# Patient Record
Sex: Female | Born: 1945
Health system: Southern US, Community
[De-identification: ages and names within clinical notes are randomized; demographics above are authoritative.]

## PROBLEM LIST (undated history)

## (undated) DIAGNOSIS — F32A Depression, unspecified: Secondary | ICD-10-CM

## (undated) DIAGNOSIS — M1990 Unspecified osteoarthritis, unspecified site: Secondary | ICD-10-CM

## (undated) DIAGNOSIS — E669 Obesity, unspecified: Secondary | ICD-10-CM

## (undated) DIAGNOSIS — F329 Major depressive disorder, single episode, unspecified: Secondary | ICD-10-CM

## (undated) DIAGNOSIS — F419 Anxiety disorder, unspecified: Secondary | ICD-10-CM

## (undated) DIAGNOSIS — K759 Inflammatory liver disease, unspecified: Secondary | ICD-10-CM

## (undated) HISTORY — PX: WISDOM TOOTH EXTRACTION: SHX21

## (undated) HISTORY — DX: Obesity, unspecified: E66.9

## (undated) HISTORY — DX: Unspecified osteoarthritis, unspecified site: M19.90

## (undated) HISTORY — DX: Major depressive disorder, single episode, unspecified: F32.9

## (undated) HISTORY — DX: Depression, unspecified: F32.A

---

## 1983-01-19 HISTORY — PX: LAPAROSCOPIC TUBAL LIGATION: SUR803

## 2000-11-23 ENCOUNTER — Other Ambulatory Visit: Admission: RE | Admit: 2000-11-23 | Discharge: 2000-11-23 | Payer: Self-pay | Admitting: Internal Medicine

## 2002-10-03 ENCOUNTER — Other Ambulatory Visit: Admission: RE | Admit: 2002-10-03 | Discharge: 2002-10-03 | Payer: Self-pay | Admitting: Family Medicine

## 2002-10-19 ENCOUNTER — Ambulatory Visit (HOSPITAL_COMMUNITY): Admission: RE | Admit: 2002-10-19 | Discharge: 2002-10-19 | Payer: Self-pay | Admitting: Gastroenterology

## 2005-04-30 ENCOUNTER — Other Ambulatory Visit: Admission: RE | Admit: 2005-04-30 | Discharge: 2005-04-30 | Payer: Self-pay | Admitting: Family Medicine

## 2005-10-29 ENCOUNTER — Ambulatory Visit: Payer: Self-pay | Admitting: Family Medicine

## 2006-02-05 ENCOUNTER — Emergency Department (HOSPITAL_COMMUNITY): Admission: EM | Admit: 2006-02-05 | Discharge: 2006-02-05 | Payer: Self-pay | Admitting: *Deleted

## 2006-12-30 ENCOUNTER — Ambulatory Visit: Payer: Self-pay | Admitting: Family Medicine

## 2007-01-19 LAB — HM COLONOSCOPY: HM Colonoscopy: NEGATIVE

## 2008-08-26 ENCOUNTER — Ambulatory Visit: Payer: Self-pay | Admitting: Family Medicine

## 2008-08-26 ENCOUNTER — Encounter: Payer: Self-pay | Admitting: Family Medicine

## 2008-08-26 ENCOUNTER — Other Ambulatory Visit: Admission: RE | Admit: 2008-08-26 | Discharge: 2008-08-26 | Payer: Self-pay | Admitting: Family Medicine

## 2008-08-26 LAB — HM PAP SMEAR: HM Pap smear: NEGATIVE

## 2009-10-17 LAB — HM MAMMOGRAPHY: HM Mammogram: NEGATIVE

## 2010-01-18 DIAGNOSIS — Z8489 Family history of other specified conditions: Secondary | ICD-10-CM

## 2010-01-18 HISTORY — DX: Family history of other specified conditions: Z84.89

## 2010-04-29 ENCOUNTER — Ambulatory Visit (INDEPENDENT_AMBULATORY_CARE_PROVIDER_SITE_OTHER): Payer: BLUE CROSS/BLUE SHIELD | Admitting: Family Medicine

## 2010-04-29 DIAGNOSIS — H01009 Unspecified blepharitis unspecified eye, unspecified eyelid: Secondary | ICD-10-CM

## 2010-06-05 NOTE — Op Note (Signed)
   NAME:  Megan Nunez, Megan Nunez                            ACCOUNT NO.:  [id number]   MEDICAL RECORD NO.:  [id number]                   PATIENT TYPE:  AMB   LOCATION:  ENDO                                 FACILITY:  [facility]   PHYSICIAN:  Danise Edge, M.D.                DATE OF BIRTH:  10-19-1945   DATE OF PROCEDURE:  10/19/2002  DATE OF DISCHARGE:                                 OPERATIVE REPORT   PROCEDURE:  Screening colonoscopy.   INDICATIONS FOR PROCEDURE:  Ms. Megan Nunez is a 65 year old female born  30-Aug-1945. Ms. Megan Nunez is scheduled to undergo her first screening  colonoscopy with polypectomy to prevent colon cancer.   ENDOSCOPIST:  Charolett Bumpers, M.D.   PREMEDICATION:  Versed 7.5 mg, Demerol 50 mg .   DESCRIPTION OF PROCEDURE:  After obtaining informed consent, Megan Nunez was  placed in the left lateral decubitus position. I administered intravenous  Demerol and intravenous Versed to achieve conscious sedation for the  procedure. The patient's blood pressure, oxygen saturation and cardiac  rhythm were monitored throughout the procedure and documented in the medical  record.   Anal inspection was normal. Digital rectal exam was normal. The Olympus  adjustable pediatric colonoscope was introduced into the rectum and advanced  to the cecum. Colonic preparation for the exam today was excellent.   RECTUM:  Normal.   SIGMOID COLON AND DESCENDING COLON:  Normal.   SPLENIC FLEXURE:  Normal.   TRANSVERSE COLON:  Normal.   HEPATIC FLEXURE:  Normal.   ASCENDING COLON:  Normal.   CECUM AND ILEOCECAL VALVE:  Normal.    ASSESSMENT:  Normal screening proctocolonoscopy to the cecum. No endoscopic  evidence for the presence of colorectal neoplasia.                                               Danise Edge, M.D.    MJ/MEDQ  D:  10/19/2002  T:  10/20/2002  Job:  [id number]   cc:   Talmadge Coventry, M.D.  [address], [address]  [place]  [place] [address]  Fax:  [phone]

## 2010-08-14 ENCOUNTER — Encounter: Payer: Self-pay | Admitting: Family Medicine

## 2011-03-24 ENCOUNTER — Encounter: Payer: Self-pay | Admitting: Internal Medicine

## 2011-07-16 ENCOUNTER — Ambulatory Visit (INDEPENDENT_AMBULATORY_CARE_PROVIDER_SITE_OTHER): Payer: BLUE CROSS/BLUE SHIELD | Admitting: Medical

## 2011-07-16 ENCOUNTER — Telehealth: Payer: Self-pay | Admitting: Family Medicine

## 2011-07-16 ENCOUNTER — Encounter: Payer: Self-pay | Admitting: Medical

## 2011-07-16 VITALS — BP 132/80 | HR 78 | Temp 97.5°F | Resp 16 | Wt 159.0 lb

## 2011-07-16 DIAGNOSIS — L988 Other specified disorders of the skin and subcutaneous tissue: Secondary | ICD-10-CM

## 2011-07-16 DIAGNOSIS — IMO0002 Reserved for concepts with insufficient information to code with codable children: Secondary | ICD-10-CM

## 2011-07-16 NOTE — Progress Notes (Signed)
Subjective:   HPI  Megan Nunez is a 66 y.o. female who presents for finger issue.  Has growth on left middle finger x 1 year causing grooving in her nail.  Has used nothing for the finger.  In the last few weeks the growth is more red and seems to be getting bigger.  No other aggravating or relieving factors.    No other c/o.  The following portions of the patient's history were reviewed and updated as appropriate: allergies, current medications, past family history, past medical history, past social history, past surgical history and problem list.  Past Medical History  Diagnosis Date  . Obesity     No Known Allergies   Review of Systems ROS reviewed and was negative other than noted in HPI or above.    Objective:   Physical Exam  General appearance: alert, no distress, WD/WN Skin: Left middle finger with raised 4mm papular lesion at base of nailbed, and this has caused a central groove in the nail.   Assessment and Plan :     Encounter Diagnosis  Name Primary?  . Cyst of finger Yes   Cyst vs pyogenic granuloma vs other.  Referral to dermatology for further management.

## 2011-07-16 NOTE — Telephone Encounter (Signed)
Patient was made aware of her appointment to see the dermatologists on 07/16/11 @ 305 pm. [facility] July 26,13 @ 1030 am With Eulah Pont   CLS

## 2012-06-27 ENCOUNTER — Ambulatory Visit (INDEPENDENT_AMBULATORY_CARE_PROVIDER_SITE_OTHER): Payer: BC Managed Care – PPO | Admitting: Family Medicine

## 2012-06-27 ENCOUNTER — Encounter: Payer: Self-pay | Admitting: Family Medicine

## 2012-06-27 VITALS — BP 140/80 | HR 72 | Wt 168.0 lb

## 2012-06-27 DIAGNOSIS — F329 Major depressive disorder, single episode, unspecified: Secondary | ICD-10-CM

## 2012-06-27 MED ORDER — BUPROPION HCL ER (XL) 300 MG PO TB24: 300.0000 mg | ORAL_TABLET | Freq: Every day | ORAL | Status: DC

## 2012-06-27 MED ORDER — FLUOXETINE HCL 40 MG PO CAPS: 40.0000 mg | ORAL_CAPSULE | Freq: Every day | ORAL | Status: DC

## 2012-06-27 NOTE — Progress Notes (Signed)
  Subjective:    Patient ID: Megan Nunez, female    DOB: 04/25/1945, 67 y.o.   MRN: [id number]  HPI She is here for consult concerning depression. She has a long history of difficulty with this. Presently she is stable on Prozac and Wellbutrin. She has been followed by Dr. Jules Schick. In the past she has tried twice to come off the medication and unfortunately lost jobs both times. She is comfortable staying on the medication. She was also given Vyvanse however has not been taking this regularly.   Review of Systems     Objective:   Physical Exam Alert and in no distress with appropriate affect       Assessment & Plan:  Depressive disorder, not elsewhere classified - Plan: buPROPion (WELLBUTRIN XL) 300 MG 24 hr tablet, FLUoxetine (PROZAC) 40 MG capsule I told her that I was comfortable continuing her psychotropic medications but was uncomfortable concerning her Vyvanse. She was comfortable with this.we also talked about the fact that she has on 2 occasions stop the medication and was unsuccessful. My plan is to continue her present medication regimen indefinitely.Did recommend she come back for complete examination.

## 2012-07-12 ENCOUNTER — Encounter: Payer: Self-pay | Admitting: Internal Medicine

## 2012-07-25 ENCOUNTER — Encounter: Payer: Self-pay | Admitting: Family Medicine

## 2012-07-25 ENCOUNTER — Ambulatory Visit (INDEPENDENT_AMBULATORY_CARE_PROVIDER_SITE_OTHER): Payer: BC Managed Care – PPO | Admitting: Family Medicine

## 2012-07-25 ENCOUNTER — Other Ambulatory Visit (HOSPITAL_COMMUNITY)
Admission: RE | Admit: 2012-07-25 | Discharge: 2012-07-25 | Disposition: A | Discharge status: Home / Self Care | Payer: BC Managed Care – PPO | Source: Ambulatory Visit | Attending: Family Medicine | Admitting: Family Medicine

## 2012-07-25 VITALS — BP 112/70 | HR 77 | Ht 61.0 in | Wt 166.0 lb

## 2012-07-25 DIAGNOSIS — Z01419 Encounter for gynecological examination (general) (routine) without abnormal findings: Secondary | ICD-10-CM | POA: Insufficient documentation

## 2012-07-25 DIAGNOSIS — H9319 Tinnitus, unspecified ear: Secondary | ICD-10-CM

## 2012-07-25 DIAGNOSIS — M1990 Unspecified osteoarthritis, unspecified site: Secondary | ICD-10-CM

## 2012-07-25 DIAGNOSIS — N3946 Mixed incontinence: Secondary | ICD-10-CM | POA: Insufficient documentation

## 2012-07-25 DIAGNOSIS — Z124 Encounter for screening for malignant neoplasm of cervix: Secondary | ICD-10-CM

## 2012-07-25 DIAGNOSIS — Z0000 Encounter for general adult medical examination without abnormal findings: Secondary | ICD-10-CM

## 2012-07-25 DIAGNOSIS — M129 Arthropathy, unspecified: Secondary | ICD-10-CM

## 2012-07-25 DIAGNOSIS — N393 Stress incontinence (female) (male): Secondary | ICD-10-CM

## 2012-07-25 DIAGNOSIS — F329 Major depressive disorder, single episode, unspecified: Secondary | ICD-10-CM

## 2012-07-25 DIAGNOSIS — H9313 Tinnitus, bilateral: Secondary | ICD-10-CM

## 2012-07-25 DIAGNOSIS — Z1239 Encounter for other screening for malignant neoplasm of breast: Secondary | ICD-10-CM

## 2012-07-25 LAB — CBC WITH DIFFERENTIAL/PLATELET
Basophils Relative: 1 % (ref 0–1)
Hemoglobin: 14.2 g/dL (ref 12.0–15.0)
Lymphocytes Relative: 31 % (ref 12–46)
MCHC: 34.7 g/dL (ref 30.0–36.0)
Monocytes Relative: 6 % (ref 3–12)
Neutro Abs: 4.7 10*3/uL (ref 1.7–7.7)
Neutrophils Relative %: 59 % (ref 43–77)
RBC: 4.79 MIL/uL (ref 3.87–5.11)
WBC: 8 10*3/uL (ref 4.0–10.5)

## 2012-07-25 LAB — COMPREHENSIVE METABOLIC PANEL
AST: 20 U/L (ref 0–37)
Albumin: 4.6 g/dL (ref 3.5–5.2)
Alkaline Phosphatase: 74 U/L (ref 39–117)
Chloride: 102 mEq/L (ref 96–112)
Glucose, Bld: 84 mg/dL (ref 70–99)
Potassium: 4.5 mEq/L (ref 3.5–5.3)
Sodium: 138 mEq/L (ref 135–145)
Total Protein: 7.1 g/dL (ref 6.0–8.3)

## 2012-07-25 LAB — POCT URINALYSIS DIPSTICK
Bilirubin, UA: NEGATIVE
Glucose, UA: NEGATIVE
Ketones, UA: NEGATIVE
Leukocytes, UA: NEGATIVE
Nitrite, UA: NEGATIVE
pH, UA: 5

## 2012-07-25 LAB — LIPID PANEL
LDL Cholesterol: 114 mg/dL — ABNORMAL HIGH (ref 0–99)
Triglycerides: 77 mg/dL (ref <150)

## 2012-07-25 NOTE — Progress Notes (Signed)
Subjective:    Patient ID: Megan Nunez, female    DOB: [birth year], 67 y.o.   MRN: [id number]  HPI  he is here for complete examination. Recently her husband had CABG and is recovering from this. She has been under a lot of stress due to this as well as work related issues. She is concerned over possibly switching to a different medication regimen since she doesn't the present one is working well. She also complains of bilateral ringing in her ears that has been present for the last year. She also has noted some incontinence especially when she sneezes or coughs. She is really doing Kegel exercises. She also complains of various arthritic pains especially in the right shoulder as well as both knees and right hip.   Review of Systems Negative except as above    Objective:   Physical Exam BP 112/70  Pulse 77  Ht 5\' 1"  (1.549 m)  Wt 166 lb (75.297 kg)  BMI 31.38 kg/m2  General Appearance:    Alert, cooperative, no distress, appears stated age  Head:    Normocephalic, without obvious abnormality, atraumatic  Eyes:    PERRL, conjunctiva/corneas clear, EOM's intact, fundi    benign  Ears:    Normal TM's and external ear canals  Nose:   Nares normal, mucosa normal, no drainage or sinus   tenderness  Throat:   Lips, mucosa, and tongue normal; teeth and gums normal  Neck:   Supple, no lymphadenopathy;  thyroid:  no   enlargement/tenderness/nodules; no carotid   bruit or JVD  Back:    Spine nontender, no curvature, ROM normal, no CVA     tenderness  Lungs:     Clear to auscultation bilaterally without wheezes, rales or     ronchi; respirations unlabored  Chest Wall:    No tenderness or deformity   Heart:    Regular rate and rhythm, S1 and S2 normal, no murmur, rub   or gallop  Breast Exam:   not done   Abdomen:     Soft, non-tender, nondistended, normoactive bowel sounds,    no masses, no hepatosplenomegaly  Genitalia:    Normal external genitalia without lesions.  BUS and vagina normal;  cervix without lesions, or cervical motion tenderness. No abnormal vaginal discharge.  Uterus and adnexa not enlarged, nontender, no masses.  Pap performed  Rectal:    Normal tone, no masses or tenderness; guaiac negative stool  Extremities:   No clubbing, cyanosis or edema  Pulses:   2+ and symmetric all extremities  Skin:   Skin color, texture, turgor normal, no rashes or lesions  Lymph nodes:   Cervical, supraclavicular, and axillary nodes normal  Neurologic:   CNII-XII intact, normal strength, sensation and gait; reflexes 2+ and symmetric throughout          Psych:   Normal mood, affect, hygiene and grooming.         Assessment & Plan:  Routine general medical examination at a health care facility - Plan: POCT Urinalysis Dipstick, Hemoccult - 1 Card (office), CBC with Differential, Comprehensive metabolic panel, Lipid panel  Arthritis  Tinnitus, bilateral  Stress incontinence in female  Depressive disorder, not elsewhere classified  discussed the arthritis and recommended using Tylenol initially and then an anti-inflammatory as needed. Discussed the fact that we do not have good therapy for her tinnitus. Recommend she continue with her Kegel exercises. We also discussed the possibility of switching to a different psychotropic however with the stress of  her husband as well as work and her considering retiring, I will hold on making any changes until the dust settles.

## 2012-07-25 NOTE — Patient Instructions (Signed)

## 2012-07-26 LAB — HM PAP SMEAR: HM Pap smear: NEGATIVE

## 2012-07-27 NOTE — Progress Notes (Signed)
Quick Note:  Mailed pt letter of labs ______ 

## 2012-08-15 ENCOUNTER — Encounter: Payer: Self-pay | Admitting: Family Medicine

## 2012-08-17 ENCOUNTER — Encounter: Payer: Self-pay | Admitting: Family Medicine

## 2012-11-23 ENCOUNTER — Other Ambulatory Visit: Payer: Self-pay

## 2013-03-05 DIAGNOSIS — Z029 Encounter for administrative examinations, unspecified: Secondary | ICD-10-CM

## 2013-03-15 ENCOUNTER — Telehealth: Payer: Self-pay | Admitting: Internal Medicine

## 2013-03-15 NOTE — Telephone Encounter (Signed)
Faxed over medical records to [facility]eds on January 29th @ [phone]

## 2013-03-20 ENCOUNTER — Ambulatory Visit (INDEPENDENT_AMBULATORY_CARE_PROVIDER_SITE_OTHER): Payer: Medicare Other | Admitting: Cardiology

## 2013-03-20 ENCOUNTER — Encounter: Payer: Self-pay | Admitting: Cardiology

## 2013-03-20 VITALS — BP 124/66 | HR 67 | Ht 61.25 in | Wt 156.6 lb

## 2013-03-20 DIAGNOSIS — Z23 Encounter for immunization: Secondary | ICD-10-CM | POA: Diagnosis not present

## 2013-03-20 DIAGNOSIS — R0989 Other specified symptoms and signs involving the circulatory and respiratory systems: Secondary | ICD-10-CM | POA: Diagnosis not present

## 2013-03-20 DIAGNOSIS — R0609 Other forms of dyspnea: Secondary | ICD-10-CM | POA: Insufficient documentation

## 2013-03-20 NOTE — Patient Instructions (Addendum)
To test your baseline cardiovascular status, we will have you perform a Metabolic Stress Test -- Bicycle Stress test. Lasts ~1-1 1/2 hours.  I will see you back in ~1 month.  Marykay LexHARDING,Keno Caraway W, MD 1

## 2013-03-20 NOTE — Progress Notes (Signed)
2 the crit  PATIENT: Megan Nunez MRN: [id number] DOB: 1945-01-25 PCP: Wyatt Haste, MD  Clinic Note: Chief Complaint  Patient presents with  . Establish Care    no chest pain , no sob, no edema    HPI: Megan Nunez is a 68 y.o. female with a PMH below who presents today for establishment of cardiology care. She is an otherwise relatively healthy woman, but has become started her overall well-being after watching her husband go through an ostial HUMI with heart failure followed by CABG..  Interval History: She basically has no notable cardiac symptoms. She does note that if she starts pressure social have some mild exertional dyspnea. She otherwise denies any significant chest tightness or pressure with rest or exertion. No resting dyspnea. No PND, orthopnea and mild lower extremity edema bilaterally at the end of the day.Marland Kitchen No palpitations, lightheadedness, dizziness, weakness or syncope/near syncope. No TIA/amaurosis fugax symptoms. No melena, hematochezia hematuria.  Past Medical History  Diagnosis Date  . Obesity   . Depression    Prior Cardiac Evaluation and Past Surgical History: Past Surgical History  Procedure Laterality Date  . Laparoscopic tubal ligation  1985   No Known Allergies  Current Outpatient Prescriptions  Medication Sig Dispense Refill  . Biotin (BIOTIN 5000) 5 MG CAPS Take by mouth.      Marland Kitchen buPROPion (WELLBUTRIN XL) 300 MG 24 hr tablet Take 1 tablet (300 mg total) by mouth daily.  90 tablet  3  . FLUoxetine (PROZAC) 40 MG capsule Take 1 capsule (40 mg total) by mouth daily.  90 capsule  3  . glucosamine-chondroitin 500-400 MG tablet Take 1 tablet by mouth 3 (three) times daily.      . Multiple Vitamins-Minerals (MULTIVITAMIN WITH MINERALS) tablet Take 1 tablet by mouth daily.      . vitamin B-12 (CYANOCOBALAMIN) 1000 MCG tablet Take 1,000 mcg by mouth daily.       No current facility-administered medications for this visit.    History   Social  History Narrative   She is married to another patient of [facility]. They have been married for 20  years.  No children. 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   He never smoked, and drinks maybe 1-3 glass of wine a day.   He has recently started exercising with her husband who has completed cardiac rehabilitation and is in the maintenance program. They usually walks about 2-3 days a week for 30-45 minutes.    family history includes Asthma in her paternal grandmother; Cancer (age of onset: 103) in her brother; Dementia in her mother; Heart disease in her maternal grandmother and mother; Lung disease in her father.  ROS: A comprehensive Review of Systems - Negative except Somewhat easily fatigued. Intermittent bouts of depression.  PHYSICAL EXAM BP 124/66  Pulse 67  Ht 5' 1.25" (1.556 m)  Wt 156 lb 9.6 oz (71.033 kg)  BMI 29.34 kg/m2 General appearance: alert, cooperative, appears stated age, no distress and borderline obese.well nourished well groomed. She answers questions appropriately. Normal mood and affect. Neck: no adenopathy, no carotid bruit, no JVD and supple, symmetrical, trachea midline Lungs: clear to auscultation bilaterally, normal percussion bilaterally and Nonlabored, good air movement Heart: regular rate and rhythm, S1, S2 normal, no murmur, click, rub or gallop and normal apical impulse Abdomen: soft, non-tender; bowel sounds normal; no masses,  no organomegaly Extremities: extremities normal, atraumatic, no cyanosis or edema and varicose  veins noted Pulses: 2+ and symmetric Neurologic: Alert and oriented X 3, normal strength and tone. Normal symmetric reflexes. Normal coordination and gait  BFM:ZUAUEBVPL today: Yes Rate: 67 , Rhythm: NSR, possible LAA. Otherwise normal EKG. normal intervals, normal axis;    Recent Labs: None available  ASSESSMENT / PLAN: A healthy woman with no major cardiac issues. She would like to have  just a baseline cardiac evaluation. Blood pressure is well-controlled. She is not on statin, but her last lipids showed total cholesterol 179, HDL 50, LDL 114, posterior and 77. For her risk level she should be as close to goal for this.  In order to best evaluate her baseline cardiovascular risk, and the most effective mechanism to evaluate this would be a CPET-MET test. We'll need a lung function test to simply be a metabolic stress test. Based on these findings we can determine whether or not we be more aggressive with risk factor modification. This will also give her baseline per, to exercise she needs to do. She does have some exertional dyspnea, which is most likely related to deconditioning, but won't exclude possible CAD.  Exertional dyspnea Evaluate with CPET-MET   Orders Placed This Encounter  Procedures  . Flu Vaccine QUAD 36+ mos IM  . CPET ONLY (MET TEST)    Standing Status: Future     Number of Occurrences:      Standing Expiration Date: 03/21/2014  . EKG 12-Lead   No orders of the defined types were placed in this encounter.    Followup: 1 months  DAVID W. Ellyn Hack, M.D., M.S. Interventional Cardiolgy [facility]

## 2013-03-22 ENCOUNTER — Encounter: Payer: Self-pay | Admitting: Cardiology

## 2013-03-22 NOTE — Assessment & Plan Note (Signed)
Evaluate with CPET-MET

## 2013-04-02 DIAGNOSIS — R0609 Other forms of dyspnea: Principal | ICD-10-CM

## 2013-04-02 DIAGNOSIS — R0602 Shortness of breath: Secondary | ICD-10-CM

## 2013-04-05 ENCOUNTER — Telehealth: Payer: Self-pay | Admitting: *Deleted

## 2013-04-05 NOTE — Telephone Encounter (Signed)
Message copied by Tobin ChadMARTIN, Quincee Gittens V. on Thu Apr 05, 2013 10:10 AM ------      Message from: Marykay LexHARDING, DAVID W      Created: Thu Apr 05, 2013 12:07 AM       Stress Test looked good!! No sign of significant Heart Artery Disease.        Excellent effort.  Peak VO2 of 96% is outstanding. Would suggest LOW RISK finding for existing heart artery disease.      Good news!!.            Keep exercising.            Marykay LexHARDING,DAVID W, MD       ------

## 2013-04-05 NOTE — Progress Notes (Signed)
Quick Note:  Stress Test looked good!! No sign of significant Heart Artery Disease.  Excellent effort. Peak VO2 of 96% is outstanding. Would suggest LOW RISK finding for existing heart artery disease. Good news!!.  Keep exercising.  Marykay LexHARDING,DAVID W, MD  ______

## 2013-04-05 NOTE — Telephone Encounter (Signed)
Spoke to patient. CPET Result given . Verbalized understanding

## 2013-04-23 ENCOUNTER — Ambulatory Visit: Payer: Medicare Other | Admitting: Cardiology

## 2013-06-28 ENCOUNTER — Ambulatory Visit
Admission: RE | Admit: 2013-06-28 | Discharge: 2013-06-28 | Disposition: A | Discharge status: Home / Self Care | Payer: Medicare Other | Source: Ambulatory Visit | Attending: Family Medicine | Admitting: Family Medicine

## 2013-06-28 ENCOUNTER — Encounter: Payer: Self-pay | Admitting: Family Medicine

## 2013-06-28 ENCOUNTER — Ambulatory Visit (INDEPENDENT_AMBULATORY_CARE_PROVIDER_SITE_OTHER): Payer: Medicare Other | Admitting: Family Medicine

## 2013-06-28 VITALS — Wt 152.0 lb

## 2013-06-28 DIAGNOSIS — M25562 Pain in left knee: Secondary | ICD-10-CM

## 2013-06-28 DIAGNOSIS — M25569 Pain in unspecified knee: Secondary | ICD-10-CM

## 2013-06-28 DIAGNOSIS — M719 Bursopathy, unspecified: Secondary | ICD-10-CM

## 2013-06-28 DIAGNOSIS — M67919 Unspecified disorder of synovium and tendon, unspecified shoulder: Secondary | ICD-10-CM | POA: Diagnosis not present

## 2013-06-28 DIAGNOSIS — M7591 Shoulder lesion, unspecified, right shoulder: Secondary | ICD-10-CM

## 2013-06-28 DIAGNOSIS — M25561 Pain in right knee: Secondary | ICD-10-CM

## 2013-06-28 DIAGNOSIS — M19049 Primary osteoarthritis, unspecified hand: Secondary | ICD-10-CM | POA: Diagnosis not present

## 2013-06-28 NOTE — Progress Notes (Signed)
   Subjective:    Patient ID: CHELITA HAVER, female    DOB: 12/18/1945, 68 y.o.   MRN: [id number]  HPI She has a five-year history of difficulty with right shoulder decreased range of motion. She was able to work around this until she started working out more which was about 3 weeks ago. She also is noting a clicking sensation with motion. No history of injury, no numbness or tingling and possible decreased strength. So over the last 3 weeks she has noted some left knee clicking. No locking or popping. She has an unusual sensation when she extends her knee. She also complains of medial joint pain over the last 3 weeks on the right. She also notes some posterior medial swelling especially after vigorous activities area she started 3 weeks ago an exercise program including cardio and N[employer] 5 days per week   Review of Systems     Objective:   Physical Exam Good range of motion of her shoulder. Drop arm test was uncomfortable. Clicking sensation noted with Neer's and Hawkins test. No laxity noted. Negative sulcus sign. Exam of the left knee did show some slight clicking superior to the joint medially near the patella. Ligaments intact. No joint line tenderness. No effusion. Negative anterior drawer and McMurray's testing. No pain on palpation of the patella or tendon. Negative compression test. Right knee exam showed no effusion, point or joint line tenderness. McMurray's testing negative. Negative anterior drawer. Patellar tendon and patella are normal.      Assessment & Plan:  Right supraspinatus tendinitis - Plan: DG Shoulder Right  Knee pain, bilateral Recommend that you back off on the strengthening and use lower weights and fully extend her knees. If the clicking continues, we can do more work. When I get the x-ray results back I will call him if they're negative, I think an injection would help.

## 2013-06-28 NOTE — Patient Instructions (Signed)
Recommend that you back off on the strengthening and use lower weights and fully extend her knees. If the clicking continues, we can do more work. When I get the x-ray results back I will call him if they're negative, I think an injection would help.

## 2013-07-09 ENCOUNTER — Encounter: Payer: Self-pay | Admitting: Family Medicine

## 2013-07-12 ENCOUNTER — Ambulatory Visit (INDEPENDENT_AMBULATORY_CARE_PROVIDER_SITE_OTHER): Payer: Medicare Other | Admitting: Family Medicine

## 2013-07-12 ENCOUNTER — Encounter: Payer: Self-pay | Admitting: Family Medicine

## 2013-07-12 VITALS — BP 112/60 | Ht 61.0 in | Wt 155.0 lb

## 2013-07-12 DIAGNOSIS — F3289 Other specified depressive episodes: Secondary | ICD-10-CM | POA: Diagnosis not present

## 2013-07-12 DIAGNOSIS — F329 Major depressive disorder, single episode, unspecified: Secondary | ICD-10-CM | POA: Diagnosis not present

## 2013-07-12 DIAGNOSIS — Z0000 Encounter for general adult medical examination without abnormal findings: Secondary | ICD-10-CM

## 2013-07-12 DIAGNOSIS — Z23 Encounter for immunization: Secondary | ICD-10-CM | POA: Diagnosis not present

## 2013-07-12 DIAGNOSIS — M1990 Unspecified osteoarthritis, unspecified site: Secondary | ICD-10-CM

## 2013-07-12 DIAGNOSIS — M129 Arthropathy, unspecified: Secondary | ICD-10-CM | POA: Diagnosis not present

## 2013-07-12 NOTE — Patient Instructions (Signed)
Get down to a size 10 dress size. Keep up the exercise and if you to make dietary changes, cut back on white food

## 2013-07-12 NOTE — Progress Notes (Signed)
   Subjective:    Patient ID: Megan Nunez, female    DOB: 09/30/1945, 68 y.o.   MRN: [id number]  HPI She is here for her initial welcome to Medicare examination. Medical and social history were reviewed. She has no evidence of depression or mood disorder. She has not had any falls recently. She does have an advanced directive. She continues on medications listed in the chart. He has no other concerns or complaints.  Review of Systems     Objective:   Physical Exam Alert and in no distress otherwise not examined       Assessment & Plan:  Need for prophylactic vaccination against Streptococcus pneumoniae (pneumococcus) - Plan: Pneumococcal conjugate vaccine 13-valent  Arthritis  Depressive disorder, not elsewhere classified  Pneumococcal vaccine given. Otherwise she will continue on her present medication regimen.

## 2013-07-13 DIAGNOSIS — M19019 Primary osteoarthritis, unspecified shoulder: Secondary | ICD-10-CM | POA: Diagnosis not present

## 2013-07-24 ENCOUNTER — Ambulatory Visit: Payer: Medicare Other | Attending: Orthopaedic Surgery

## 2013-07-24 DIAGNOSIS — M19019 Primary osteoarthritis, unspecified shoulder: Secondary | ICD-10-CM | POA: Insufficient documentation

## 2013-07-24 DIAGNOSIS — IMO0001 Reserved for inherently not codable concepts without codable children: Secondary | ICD-10-CM | POA: Diagnosis not present

## 2013-07-24 DIAGNOSIS — R5381 Other malaise: Secondary | ICD-10-CM | POA: Insufficient documentation

## 2013-07-24 DIAGNOSIS — M25619 Stiffness of unspecified shoulder, not elsewhere classified: Secondary | ICD-10-CM | POA: Insufficient documentation

## 2013-07-24 DIAGNOSIS — M25519 Pain in unspecified shoulder: Secondary | ICD-10-CM | POA: Insufficient documentation

## 2013-07-26 ENCOUNTER — Ambulatory Visit: Payer: Medicare Other

## 2013-07-26 DIAGNOSIS — IMO0001 Reserved for inherently not codable concepts without codable children: Secondary | ICD-10-CM | POA: Diagnosis not present

## 2013-07-30 ENCOUNTER — Encounter: Payer: Medicare Other | Admitting: Physical Therapy

## 2013-07-31 ENCOUNTER — Ambulatory Visit: Payer: Medicare Other | Admitting: Physical Therapy

## 2013-07-31 DIAGNOSIS — IMO0001 Reserved for inherently not codable concepts without codable children: Secondary | ICD-10-CM | POA: Diagnosis not present

## 2013-08-02 ENCOUNTER — Ambulatory Visit: Payer: Medicare Other

## 2013-08-02 DIAGNOSIS — IMO0001 Reserved for inherently not codable concepts without codable children: Secondary | ICD-10-CM | POA: Diagnosis not present

## 2013-08-06 ENCOUNTER — Ambulatory Visit: Payer: Medicare Other | Admitting: Physical Therapy

## 2013-08-06 DIAGNOSIS — IMO0001 Reserved for inherently not codable concepts without codable children: Secondary | ICD-10-CM | POA: Diagnosis not present

## 2013-08-08 ENCOUNTER — Ambulatory Visit: Payer: Medicare Other | Admitting: Physical Therapy

## 2013-08-08 DIAGNOSIS — IMO0001 Reserved for inherently not codable concepts without codable children: Secondary | ICD-10-CM | POA: Diagnosis not present

## 2013-08-13 ENCOUNTER — Ambulatory Visit: Payer: Medicare Other | Admitting: Physical Therapy

## 2013-08-13 DIAGNOSIS — IMO0001 Reserved for inherently not codable concepts without codable children: Secondary | ICD-10-CM | POA: Diagnosis not present

## 2013-08-15 ENCOUNTER — Encounter: Payer: Medicare Other | Admitting: Physical Therapy

## 2013-08-20 ENCOUNTER — Encounter: Payer: Medicare Other | Admitting: Physical Therapy

## 2013-08-22 ENCOUNTER — Ambulatory Visit: Payer: Medicare Other

## 2013-08-27 ENCOUNTER — Ambulatory Visit: Payer: Medicare Other | Attending: Orthopaedic Surgery | Admitting: Physical Therapy

## 2013-08-27 DIAGNOSIS — M19019 Primary osteoarthritis, unspecified shoulder: Secondary | ICD-10-CM | POA: Diagnosis not present

## 2013-08-27 DIAGNOSIS — R5381 Other malaise: Secondary | ICD-10-CM | POA: Insufficient documentation

## 2013-08-27 DIAGNOSIS — IMO0001 Reserved for inherently not codable concepts without codable children: Secondary | ICD-10-CM | POA: Diagnosis not present

## 2013-08-27 DIAGNOSIS — M25619 Stiffness of unspecified shoulder, not elsewhere classified: Secondary | ICD-10-CM | POA: Diagnosis not present

## 2013-08-27 DIAGNOSIS — M25519 Pain in unspecified shoulder: Secondary | ICD-10-CM | POA: Insufficient documentation

## 2013-08-28 ENCOUNTER — Other Ambulatory Visit: Payer: Self-pay

## 2013-08-28 DIAGNOSIS — F329 Major depressive disorder, single episode, unspecified: Secondary | ICD-10-CM

## 2013-08-28 DIAGNOSIS — F3289 Other specified depressive episodes: Secondary | ICD-10-CM

## 2013-08-28 MED ORDER — FLUOXETINE HCL 40 MG PO CAPS: 40.0000 mg | ORAL_CAPSULE | Freq: Every day | ORAL | Status: DC

## 2013-08-28 MED ORDER — BUPROPION HCL ER (XL) 300 MG PO TB24: 300.0000 mg | ORAL_TABLET | Freq: Every day | ORAL | Status: DC

## 2013-08-28 NOTE — Telephone Encounter (Signed)
SENT IN MEDS 

## 2013-08-30 ENCOUNTER — Ambulatory Visit: Payer: Medicare Other | Admitting: Physical Therapy

## 2013-08-30 DIAGNOSIS — IMO0001 Reserved for inherently not codable concepts without codable children: Secondary | ICD-10-CM | POA: Diagnosis not present

## 2013-08-30 DIAGNOSIS — M25619 Stiffness of unspecified shoulder, not elsewhere classified: Secondary | ICD-10-CM | POA: Diagnosis not present

## 2013-08-30 DIAGNOSIS — M25519 Pain in unspecified shoulder: Secondary | ICD-10-CM | POA: Diagnosis not present

## 2013-08-30 DIAGNOSIS — M19019 Primary osteoarthritis, unspecified shoulder: Secondary | ICD-10-CM | POA: Diagnosis not present

## 2013-08-30 DIAGNOSIS — R5381 Other malaise: Secondary | ICD-10-CM | POA: Diagnosis not present

## 2013-09-03 ENCOUNTER — Ambulatory Visit: Payer: Medicare Other

## 2013-09-03 DIAGNOSIS — IMO0001 Reserved for inherently not codable concepts without codable children: Secondary | ICD-10-CM | POA: Diagnosis not present

## 2013-09-03 DIAGNOSIS — M25519 Pain in unspecified shoulder: Secondary | ICD-10-CM | POA: Diagnosis not present

## 2013-09-03 DIAGNOSIS — M19019 Primary osteoarthritis, unspecified shoulder: Secondary | ICD-10-CM | POA: Diagnosis not present

## 2013-09-03 DIAGNOSIS — M25619 Stiffness of unspecified shoulder, not elsewhere classified: Secondary | ICD-10-CM | POA: Diagnosis not present

## 2013-09-03 DIAGNOSIS — R5381 Other malaise: Secondary | ICD-10-CM | POA: Diagnosis not present

## 2013-09-06 ENCOUNTER — Ambulatory Visit: Payer: Medicare Other

## 2013-09-06 DIAGNOSIS — M25619 Stiffness of unspecified shoulder, not elsewhere classified: Secondary | ICD-10-CM | POA: Diagnosis not present

## 2013-09-06 DIAGNOSIS — M19019 Primary osteoarthritis, unspecified shoulder: Secondary | ICD-10-CM | POA: Diagnosis not present

## 2013-09-06 DIAGNOSIS — IMO0001 Reserved for inherently not codable concepts without codable children: Secondary | ICD-10-CM | POA: Diagnosis not present

## 2013-09-06 DIAGNOSIS — R5381 Other malaise: Secondary | ICD-10-CM | POA: Diagnosis not present

## 2013-09-06 DIAGNOSIS — M25519 Pain in unspecified shoulder: Secondary | ICD-10-CM | POA: Diagnosis not present

## 2013-09-10 ENCOUNTER — Ambulatory Visit: Payer: Medicare Other

## 2013-09-10 DIAGNOSIS — M19019 Primary osteoarthritis, unspecified shoulder: Secondary | ICD-10-CM | POA: Diagnosis not present

## 2013-09-10 DIAGNOSIS — IMO0001 Reserved for inherently not codable concepts without codable children: Secondary | ICD-10-CM | POA: Diagnosis not present

## 2013-09-10 DIAGNOSIS — M25619 Stiffness of unspecified shoulder, not elsewhere classified: Secondary | ICD-10-CM | POA: Diagnosis not present

## 2013-09-10 DIAGNOSIS — R5381 Other malaise: Secondary | ICD-10-CM | POA: Diagnosis not present

## 2013-09-10 DIAGNOSIS — M25519 Pain in unspecified shoulder: Secondary | ICD-10-CM | POA: Diagnosis not present

## 2013-09-12 ENCOUNTER — Ambulatory Visit: Payer: Medicare Other | Admitting: Physical Therapy

## 2013-09-12 DIAGNOSIS — M25519 Pain in unspecified shoulder: Secondary | ICD-10-CM | POA: Diagnosis not present

## 2013-09-12 DIAGNOSIS — M25619 Stiffness of unspecified shoulder, not elsewhere classified: Secondary | ICD-10-CM | POA: Diagnosis not present

## 2013-09-12 DIAGNOSIS — IMO0001 Reserved for inherently not codable concepts without codable children: Secondary | ICD-10-CM | POA: Diagnosis not present

## 2013-09-12 DIAGNOSIS — R5381 Other malaise: Secondary | ICD-10-CM | POA: Diagnosis not present

## 2013-09-12 DIAGNOSIS — M19019 Primary osteoarthritis, unspecified shoulder: Secondary | ICD-10-CM | POA: Diagnosis not present

## 2013-09-13 DIAGNOSIS — M19019 Primary osteoarthritis, unspecified shoulder: Secondary | ICD-10-CM | POA: Diagnosis not present

## 2013-09-21 DIAGNOSIS — Z1231 Encounter for screening mammogram for malignant neoplasm of breast: Secondary | ICD-10-CM | POA: Diagnosis not present

## 2013-09-21 LAB — HM MAMMOGRAPHY

## 2013-09-25 ENCOUNTER — Encounter: Payer: Self-pay | Admitting: Internal Medicine

## 2013-11-02 ENCOUNTER — Encounter: Payer: Self-pay | Admitting: Family Medicine

## 2013-11-02 ENCOUNTER — Other Ambulatory Visit: Payer: Self-pay

## 2013-11-14 ENCOUNTER — Ambulatory Visit (INDEPENDENT_AMBULATORY_CARE_PROVIDER_SITE_OTHER): Payer: Medicare Other | Admitting: Medical

## 2013-11-14 ENCOUNTER — Encounter: Payer: Self-pay | Admitting: Medical

## 2013-11-14 VITALS — BP 110/60 | HR 67 | Temp 97.7°F | Resp 14 | Wt 153.0 lb

## 2013-11-14 DIAGNOSIS — W1809XA Striking against other object with subsequent fall, initial encounter: Secondary | ICD-10-CM

## 2013-11-14 DIAGNOSIS — Z23 Encounter for immunization: Secondary | ICD-10-CM | POA: Diagnosis not present

## 2013-11-14 DIAGNOSIS — S81802A Unspecified open wound, left lower leg, initial encounter: Secondary | ICD-10-CM | POA: Diagnosis not present

## 2013-11-14 DIAGNOSIS — S50312A Abrasion of left elbow, initial encounter: Secondary | ICD-10-CM

## 2013-11-14 DIAGNOSIS — S80811A Abrasion, right lower leg, initial encounter: Secondary | ICD-10-CM

## 2013-11-14 NOTE — Patient Instructions (Signed)
Wounds/abrasions  Keep the wounds clean with soap and water daily  Consider hot soapy bath soaks for 20 minutes daily  Cover the left lower leg with bandage and dressing, change bandage 1-2 times daily  You can cover the smaller abrasions with band aid  Use the antibiotic ointment on the wounds  You may use ice pack for swelling and pain, using a cloth between the leg and ice pack  If the left lower leg wound wants to bleed more, use a compression dressing but using several gauze pads and firmer/tighter dressing around the gauze pads over the left lower leg.  Recheck in 1 week  After the wounds scab over and start to heal, you can then use Shaea butter or Vitamin E cream to help prevent scarring

## 2013-11-14 NOTE — Progress Notes (Signed)
Subjective: Here for fall.  Fell yesterday afternoon.  Was walking out of the house, turned into a knee high wall, tripped over the wall hitting the wall with her lower legs, fell onto left elbow.  No head injury, no LOC.   At the time of the fall, had pain in both anterior shins and left elbow.  Husband cleaned her wounds with betadine and antibiotic ointment.   This mooring ok.  Using tylenol for pain.  Pain much improved today.  Wanted to come in for eval since one of the cuts was deep and bleeding. No other aggravating or relieving factors. No other c/o.   Objective: Gen: wd, wn, nad Left elbow laterally with small 1 cm abrasion, tender over the area, right anterior lower leg with 2 separate centimeters size abrasions, left lower leg anteriorly with 2 separate wounds, the first wound is proximal lower leg anteriorly with 4 cm long linear gash approximately 3 mm deep with some bloody drainage, lower third anterior left leg with somewhat of a V-shaped gash approximately 3 mm deep, 3-4 mm wide, with bloody seepage.  All the wounds are mildly tender over the wounds, but no fluctuance, no surrounding erythema, no pus or warmth   Assessment: Encounter Diagnoses  Name Primary?  . Abrasion of leg, right, initial encounter Yes  . Elbow abrasion, left, initial encounter   . Leg wound, left, initial encounter   . Fall against object, initial encounter   . Flu vaccine need     Plan: Discussed her injury, wounds, findings and treatment recommendations. Cleaned wounds, placed new sterile bandages and dressings.    Patient Instructions  Wounds/abrasions  Keep the wounds clean with soap and water daily  Consider hot soapy bath soaks for 20 minutes daily  Cover the left lower leg with bandage and dressing, change bandage 1-2 times daily  You can cover the smaller abrasions with band aid  Use the antibiotic ointment on the wounds  You may use ice pack for swelling and pain, using a cloth between  the leg and ice pack  If the left lower leg wound wants to bleed more, use a compression dressing but using several gauze pads and firmer/tighter dressing around the gauze pads over the left lower leg.  Recheck in 1 week  After the wounds scab over and start to heal, you can then use Shaea butter or Vitamin E cream to help prevent scarring   Counseled on the influenza virus vaccine.  Vaccine information sheet given.  Influenza vaccine given after consent obtained.  F/u 1 week

## 2013-11-21 ENCOUNTER — Encounter: Payer: Self-pay | Admitting: Medical

## 2013-11-21 ENCOUNTER — Ambulatory Visit (INDEPENDENT_AMBULATORY_CARE_PROVIDER_SITE_OTHER): Payer: Medicare Other | Admitting: Medical

## 2013-11-21 VITALS — BP 112/68 | HR 80 | Temp 98.0°F | Resp 16 | Wt 154.0 lb

## 2013-11-21 DIAGNOSIS — S81802D Unspecified open wound, left lower leg, subsequent encounter: Secondary | ICD-10-CM

## 2013-11-21 DIAGNOSIS — S81801D Unspecified open wound, right lower leg, subsequent encounter: Secondary | ICD-10-CM | POA: Diagnosis not present

## 2013-11-21 MED ORDER — SILVER SULFADIAZINE 1 % EX CREA: 1.0000 "application " | TOPICAL_CREAM | Freq: Every day | CUTANEOUS | Status: DC

## 2013-11-21 MED ORDER — CEPHALEXIN 500 MG PO CAPS: 500.0000 mg | ORAL_CAPSULE | Freq: Three times a day (TID) | ORAL | Status: DC

## 2013-11-21 NOTE — Progress Notes (Signed)
Subjective: Here for wound check.  I saw her 11/14/13 for fall and wounds.    Since last visit has been keeping wounds clean, overall seeing improvement but concerned about left leg wounds redness and not healed up yet.   Hx/o from last visit.  Was walking out of the house, turned into a knee high wall, tripped over the wall hitting the wall with her lower legs, fell onto left elbow.    Objective: Gen: wd, wn, nad Left elbow laterally with small healing abrasion, right anterior lower leg with 2 separate small 1-2 mm scaling lesions, healing appropriately, left lower leg anteriorly with 2 separate wounds, the first wound is proximal lower leg anteriorly with 4 cm long linear gash approximately 2mm deep, lower third anterior left leg with somewhat of a V-shaped gash approximately 2mm deep, 3mm wide, with serous seepage.  The lower left wound with slight erythema surrounding.   Healing but questionable early cellulitis of left lower leg wound .  No tenderness,  no fluctuance, no pus or warmth   Assessment: Encounter Diagnoses  Name Primary?  . Leg wound, left, subsequent encounter Yes  . Leg wound, right, subsequent encounter     Plan: Begin Keflex oral for possible early cellulitis of left leg lower anterior wound.  Daily soap and water cleaning of wounds.   Mild debridement with wash cloth.  Use silvadene cream and daily dressing changes.   Call if worse or not improving, otherwise recheck 5-7 days.  F/u 1 week

## 2014-02-28 DIAGNOSIS — H2510 Age-related nuclear cataract, unspecified eye: Secondary | ICD-10-CM | POA: Diagnosis not present

## 2014-02-28 DIAGNOSIS — H43313 Vitreous membranes and strands, bilateral: Secondary | ICD-10-CM | POA: Diagnosis not present

## 2014-03-18 ENCOUNTER — Encounter: Payer: Self-pay | Admitting: Family Medicine

## 2014-03-25 ENCOUNTER — Encounter: Payer: Self-pay | Admitting: Family Medicine

## 2014-03-25 ENCOUNTER — Ambulatory Visit (INDEPENDENT_AMBULATORY_CARE_PROVIDER_SITE_OTHER): Payer: Medicare Other | Admitting: Family Medicine

## 2014-03-25 VITALS — BP 122/78 | HR 64 | Wt 153.0 lb

## 2014-03-25 DIAGNOSIS — F901 Attention-deficit hyperactivity disorder, predominantly hyperactive type: Secondary | ICD-10-CM

## 2014-03-25 DIAGNOSIS — F329 Major depressive disorder, single episode, unspecified: Secondary | ICD-10-CM | POA: Diagnosis not present

## 2014-03-25 DIAGNOSIS — F32A Depression, unspecified: Secondary | ICD-10-CM

## 2014-03-25 MED ORDER — AMPHETAMINE-DEXTROAMPHETAMINE 10 MG PO TABS: 10.0000 mg | ORAL_TABLET | Freq: Every day | ORAL | Status: DC

## 2014-03-25 NOTE — Progress Notes (Signed)
   Subjective:    Patient ID: Megan Nunez, female    DOB: 05/01/1945, 69 y.o.   MRN: [id number]  HPI  she is here for consult concerning ADD. She was on Vyvanse in the past given to her by psychiatrist Dr. Raquel JamesPittman. She is no longer seeing Dr. Raquel JamesPittman.  When she was placed on the Vyvanse she did note an improvement in her focus. Past history is significant for symptoms of inattention, fidgety behavior and incomplete tasks. She has difficulty relating this to have her back in her past but did seem to allude to this being there in grade school and in high school.   Review of Systems     Objective:   Physical Exam  alert and in no distress. ADHD testing 27  area       Assessment & Plan:  Depression  Attention-deficit hyperactivity disorder, predominantly hyperactive type - Plan: amphetamine-dextroamphetamine (ADDERALL) 10 MG tablet  I discussed treatment with her. Recommend we start with Adderall due to the cost of Vyvanse and that she is now on Medicare. She will let me know if it works, how long it works and if she has any trouble.

## 2014-03-25 NOTE — Patient Instructions (Signed)
Take the Adderall me know if it works, how long doesn't work and he you have any problems with it especially at the end

## 2014-04-25 ENCOUNTER — Telehealth: Payer: Self-pay | Admitting: Internal Medicine

## 2014-04-25 ENCOUNTER — Encounter: Payer: Self-pay | Admitting: Family Medicine

## 2014-04-25 DIAGNOSIS — F901 Attention-deficit hyperactivity disorder, predominantly hyperactive type: Secondary | ICD-10-CM

## 2014-04-25 NOTE — Telephone Encounter (Signed)
Pt called stating that she needs a refill on her Adderall. She is doing great on the med and it is helping a lot. Call when ready.  Also pt is thinking ahead [place] July and august and wants to know if you can write her adderall for more than 30 pills for this time. Pt is going to call her insurance and find out what she needs to do or see if they will approve a 90 day at a time for just this one time for July and let us know or see if they will approve it for 2 extra weeks

## 2014-04-25 NOTE — Telephone Encounter (Signed)
Pt called back and states that her insurance will cover a 90 day supply so when it comes to refilling her med in the summer she would like a 90 day so she can have it for the whole time she is gone

## 2014-04-26 MED ORDER — AMPHETAMINE-DEXTROAMPHETAMINE 10 MG PO TABS: 10.0000 mg | ORAL_TABLET | Freq: Every day | ORAL | Status: DC

## 2014-04-26 MED ORDER — AMPHETAMINE-DEXTROAMPHETAMINE 10 MG PO TABS: 10.0000 mg | ORAL_TABLET | Freq: Two times a day (BID) | ORAL | Status: DC

## 2014-04-26 NOTE — Telephone Encounter (Signed)
Pt informed rx is ready for pick up

## 2014-04-26 NOTE — Telephone Encounter (Signed)
I need more information from her. Find out how long one pill lasts and if she notes any problems when it wears off

## 2014-04-26 NOTE — Telephone Encounter (Signed)
This is from the patient mychart email   Actually, about the generic Adderall Rx:   It's helping greatly, by my assessment and by Rick's too.  Megan Nunez is going to ask you about a 90-day prescription for when I go to [place] this summer. I don't need 90 days now. I will be gone from late June to mid-August, so getting the 90 days in early or mid-June would work fine.

## 2014-04-26 NOTE — Telephone Encounter (Signed)
She doesn't notice any changes she observed her on behavior she getting more done and believes pill last all day less distractible ? On 90 day RX she is going gone late June to early aug.

## 2014-04-30 ENCOUNTER — Telehealth: Payer: Self-pay | Admitting: Family Medicine

## 2014-04-30 DIAGNOSIS — F901 Attention-deficit hyperactivity disorder, predominantly hyperactive type: Secondary | ICD-10-CM

## 2014-04-30 NOTE — Telephone Encounter (Signed)
Pt came to pick up the 3 Adderall 10mg  scripts. She states the script dated 06/26/14 should be for #90 since she is going to be out of town from June to sometime in August. Did Dr Susann GivensLalonde approve this? Pt left the 06/26/14 script so that it could be changed if possible and call pt at [phone] after Dr Susann GivensLalonde rewrite and/or review.

## 2014-05-01 ENCOUNTER — Encounter: Payer: Self-pay | Admitting: Family Medicine

## 2014-05-01 MED ORDER — AMPHETAMINE-DEXTROAMPHETAMINE 10 MG PO TABS: 10.0000 mg | ORAL_TABLET | Freq: Every day | ORAL | Status: DC

## 2014-05-01 NOTE — Telephone Encounter (Signed)
error 

## 2014-05-01 NOTE — Telephone Encounter (Signed)
The gym prescription was re written for 90.Patient will be leaving the country for several months

## 2014-09-30 ENCOUNTER — Other Ambulatory Visit: Payer: Self-pay | Admitting: Family Medicine

## 2014-09-30 NOTE — Telephone Encounter (Signed)
IS THIS OKAY 

## 2014-11-05 ENCOUNTER — Ambulatory Visit (INDEPENDENT_AMBULATORY_CARE_PROVIDER_SITE_OTHER): Payer: Medicare Other | Admitting: Family Medicine

## 2014-11-05 ENCOUNTER — Encounter: Payer: Self-pay | Admitting: Family Medicine

## 2014-11-05 VITALS — BP 110/60 | HR 69 | Wt 155.0 lb

## 2014-11-05 DIAGNOSIS — S29011A Strain of muscle and tendon of front wall of thorax, initial encounter: Secondary | ICD-10-CM | POA: Diagnosis not present

## 2014-11-05 DIAGNOSIS — Z23 Encounter for immunization: Secondary | ICD-10-CM | POA: Diagnosis not present

## 2014-11-05 NOTE — Patient Instructions (Signed)
You can take 2 Naprosyn twice per day for the pain. At this point avoid any upper body type exercises until is quite stable. Then start back but start low and go slow

## 2014-11-05 NOTE — Progress Notes (Signed)
   Subjective:    Patient ID: Megan Nunez, female    DOB: 01/14/1946, 69 y.o.   MRN: [id number]  HPI She started an exercise program 5 days ago including upper and lower body. She did note some difficulty with left anterior chest pain that is made worse with breathing and lying on the left side. She did try Tylenol and then a codeine last night which did help with her symptoms. Presently she is not having any chest pain, shortness of breath or pain on motion.   Review of Systems     Objective:   Physical Exam Alert and in no distress. No chest wall tenderness. Lungs are clear to auscultation. Normal motion of her back.       Assessment & Plan:  Need for prophylactic vaccination against Streptococcus pneumoniae (pneumococcus) - Plan: Pneumococcal polysaccharide vaccine 23-valent greater than or equal to 2yo subcutaneous/IM  Need for prophylactic vaccination and inoculation against influenza - Plan: Flu vaccine HIGH DOSE PF (Fluzone High dose)  Chest wall muscle strain, initial encounter I explained that she probably strained some intercostal muscles. Recommend conservative care with backing off on the exercise and using Naprosyn. When she starts back she is to start low and go slow. Her immunizations will also be updated.

## 2014-12-04 DIAGNOSIS — Z1231 Encounter for screening mammogram for malignant neoplasm of breast: Secondary | ICD-10-CM | POA: Diagnosis not present

## 2014-12-04 LAB — HM MAMMOGRAPHY
HM Mammogram: NEGATIVE
HM Mammogram: NEGATIVE

## 2014-12-05 ENCOUNTER — Encounter: Payer: Self-pay | Admitting: Family Medicine

## 2014-12-09 ENCOUNTER — Other Ambulatory Visit: Payer: Self-pay | Admitting: Family Medicine

## 2014-12-09 DIAGNOSIS — F901 Attention-deficit hyperactivity disorder, predominantly hyperactive type: Secondary | ICD-10-CM

## 2014-12-10 MED ORDER — AMPHETAMINE-DEXTROAMPHETAMINE 10 MG PO TABS: 10.0000 mg | ORAL_TABLET | Freq: Two times a day (BID) | ORAL | Status: DC

## 2014-12-10 NOTE — Addendum Note (Signed)
Addended by: Ronnald NianLALONDE, JOHN C on: 12/10/2014 01:02 PM   Modules accepted: Orders

## 2015-04-23 ENCOUNTER — Other Ambulatory Visit: Payer: Self-pay | Admitting: Family Medicine

## 2015-04-23 NOTE — Telephone Encounter (Signed)
Is this okay to refill? 

## 2015-07-18 ENCOUNTER — Other Ambulatory Visit: Payer: Self-pay | Admitting: Family Medicine

## 2015-07-21 ENCOUNTER — Other Ambulatory Visit: Payer: Self-pay | Admitting: Family Medicine

## 2015-07-21 ENCOUNTER — Telehealth: Payer: Self-pay

## 2015-07-21 DIAGNOSIS — F901 Attention-deficit hyperactivity disorder, predominantly hyperactive type: Secondary | ICD-10-CM

## 2015-07-21 MED ORDER — AMPHETAMINE-DEXTROAMPHETAMINE 10 MG PO TABS
10.0000 mg | ORAL_TABLET | Freq: Two times a day (BID) | ORAL | Status: DC
Start: 2015-08-21 — End: 2015-12-25

## 2015-07-21 MED ORDER — AMPHETAMINE-DEXTROAMPHETAMINE 10 MG PO TABS: 10.0000 mg | ORAL_TABLET | Freq: Two times a day (BID) | ORAL | Status: DC

## 2015-07-21 NOTE — Telephone Encounter (Signed)
Pt is aware Dr. Susann GivensLalonde is not in the office until 07/22/2045 to sign Adderall script. It is in the Dr Susann GivensLalonde folder on my desk.  Thanks, RLB

## 2015-07-23 NOTE — Telephone Encounter (Signed)
LM that script ready for pick up at front desk. Marland Kitchen. Trixie Rude/RLB

## 2015-08-01 ENCOUNTER — Other Ambulatory Visit: Payer: Self-pay | Admitting: Family Medicine

## 2015-08-01 NOTE — Telephone Encounter (Signed)
Is this ok to refill?  

## 2015-12-05 DIAGNOSIS — Z1231 Encounter for screening mammogram for malignant neoplasm of breast: Secondary | ICD-10-CM | POA: Diagnosis not present

## 2015-12-05 LAB — HM MAMMOGRAPHY

## 2015-12-08 ENCOUNTER — Encounter: Payer: Self-pay | Admitting: Family Medicine

## 2015-12-25 ENCOUNTER — Encounter: Payer: Self-pay | Admitting: Family Medicine

## 2015-12-25 ENCOUNTER — Ambulatory Visit (INDEPENDENT_AMBULATORY_CARE_PROVIDER_SITE_OTHER): Payer: Medicare Other | Admitting: Family Medicine

## 2015-12-25 VITALS — BP 114/72 | HR 78 | Ht 61.5 in | Wt 160.0 lb

## 2015-12-25 DIAGNOSIS — M1990 Unspecified osteoarthritis, unspecified site: Secondary | ICD-10-CM

## 2015-12-25 DIAGNOSIS — F329 Major depressive disorder, single episode, unspecified: Secondary | ICD-10-CM

## 2015-12-25 DIAGNOSIS — G8929 Other chronic pain: Secondary | ICD-10-CM | POA: Diagnosis not present

## 2015-12-25 DIAGNOSIS — F988 Other specified behavioral and emotional disorders with onset usually occurring in childhood and adolescence: Secondary | ICD-10-CM | POA: Diagnosis not present

## 2015-12-25 DIAGNOSIS — M25511 Pain in right shoulder: Secondary | ICD-10-CM

## 2015-12-25 DIAGNOSIS — N393 Stress incontinence (female) (male): Secondary | ICD-10-CM | POA: Diagnosis not present

## 2015-12-25 DIAGNOSIS — Z1159 Encounter for screening for other viral diseases: Secondary | ICD-10-CM | POA: Diagnosis not present

## 2015-12-25 DIAGNOSIS — F32A Depression, unspecified: Secondary | ICD-10-CM

## 2015-12-25 DIAGNOSIS — Z13820 Encounter for screening for osteoporosis: Secondary | ICD-10-CM

## 2015-12-25 DIAGNOSIS — Z23 Encounter for immunization: Secondary | ICD-10-CM

## 2015-12-25 LAB — CBC WITH DIFFERENTIAL/PLATELET
BASOS PCT: 0 %
Basophils Absolute: 0 cells/uL (ref 0–200)
EOS ABS: 182 {cells}/uL (ref 15–500)
Eosinophils Relative: 2 %
HEMATOCRIT: 40.8 % (ref 35.0–45.0)
Hemoglobin: 13.9 g/dL (ref 11.7–15.5)
LYMPHS PCT: 31 %
Lymphs Abs: 2821 cells/uL (ref 850–3900)
MCH: 30.1 pg (ref 27.0–33.0)
MCHC: 34.1 g/dL (ref 32.0–36.0)
MCV: 88.3 fL (ref 80.0–100.0)
MONO ABS: 546 {cells}/uL (ref 200–950)
MONOS PCT: 6 %
MPV: 11.6 fL (ref 7.5–12.5)
NEUTROS ABS: 5551 {cells}/uL (ref 1500–7800)
Neutrophils Relative %: 61 %
PLATELETS: 259 10*3/uL (ref 140–400)
RBC: 4.62 MIL/uL (ref 3.80–5.10)
RDW: 13.6 % (ref 11.0–15.0)
WBC: 9.1 10*3/uL (ref 4.0–10.5)

## 2015-12-25 LAB — COMPREHENSIVE METABOLIC PANEL
ALK PHOS: 68 U/L (ref 33–130)
ALT: 14 U/L (ref 6–29)
AST: 19 U/L (ref 10–35)
Albumin: 4.2 g/dL (ref 3.6–5.1)
BILIRUBIN TOTAL: 0.4 mg/dL (ref 0.2–1.2)
BUN: 13 mg/dL (ref 7–25)
CO2: 25 mmol/L (ref 20–31)
CREATININE: 0.65 mg/dL (ref 0.50–0.99)
Calcium: 9 mg/dL (ref 8.6–10.4)
Chloride: 103 mmol/L (ref 98–110)
GLUCOSE: 98 mg/dL (ref 65–99)
Potassium: 3.9 mmol/L (ref 3.5–5.3)
Sodium: 138 mmol/L (ref 135–146)
TOTAL PROTEIN: 6.6 g/dL (ref 6.1–8.1)

## 2015-12-25 MED ORDER — AMPHETAMINE-DEXTROAMPHETAMINE 10 MG PO TABS: 10.0000 mg | ORAL_TABLET | Freq: Two times a day (BID) | ORAL | 0 refills | Status: DC

## 2015-12-25 MED ORDER — BUPROPION HCL ER (XL) 300 MG PO TB24: 300.0000 mg | ORAL_TABLET | Freq: Every day | ORAL | 3 refills | Status: DC

## 2015-12-25 MED ORDER — FLUOXETINE HCL 40 MG PO CAPS: 40.0000 mg | ORAL_CAPSULE | Freq: Every day | ORAL | 3 refills | Status: DC

## 2015-12-25 NOTE — Progress Notes (Signed)
Subjective:    Patient ID: Megan Nunez, female    DOB: 03/16/1945, 70 y.o.   MRN: [id number]  HPI She is here for general checkup. She is again having difficulty with right shoulder pain. She had difficulty with this several years ago and did go through shoulder rehabilitation with good results. Within the last year she has had increased difficulty with this and has noted a cracking sensation in her shoulder. She did not start her own home rehabilitation back again. She does have underlying depression and in the past had seen Dr. Betti Cruzeddy however she has been quite stable. In the past on 2 occasions she did try to discontinue her Prozac but had a recurrence of her underlying depression symptoms. She has not tried to taper since she has been on Wellbutrin. She also has history of ADD and has responded quite nicely to Adderall with her focus. She does have underlying stress incontinence but presently is not using any medications. She is not interested in any meds at this point. Family and social history as well as health maintenance and immunizations were reviewed. She is having no difficulty with allergies, stomach or GU issues other than as above  Review of Systems  All other systems reviewed and are negative.      Objective:   Physical Exam BP 114/72   Pulse 78   Ht 5' 1.5" (1.562 m)   Wt 160 lb (72.6 kg)   SpO2 98%   BMI 29.74 kg/m   General Appearance:    Alert, cooperative, no distress, appears stated age  Head:    Normocephalic, without obvious abnormality, atraumatic  Eyes:    PERRL, conjunctiva/corneas clear, EOM's intact, fundi    benign  Ears:    Normal TM's and external ear canals  Nose:   Nares normal, mucosa normal, no drainage or sinus   tenderness  Throat:   Lips, mucosa, and tongue normal; teeth and gums normal  Neck:   Supple, no lymphadenopathy;  thyroid:  no   enlargement/tenderness/nodules; no carotid   bruit or JVD     Lungs:     Clear to auscultation bilaterally  without wheezes, rales or     ronchi; respirations unlabored      Heart:    Regular rate and rhythm, S1 and S2 normal, no murmur, rub   or gallop  Breast Exam:    Deferred to GYN  Abdomen:     Soft, non-tender, nondistended, normoactive bowel sounds,    no masses, no hepatosplenomegaly  Genitalia:    Deferred to GYN     Extremities:   No clubbing, cyanosis or edema. Exam of the right shoulder does show crepitus with near's and Hawkins test. Internal and external rotation was uncomfortable. Negative sulcus test. O'Brien's test was uncomfortable.   Pulses:   2+ and symmetric all extremities  Skin:   Skin color, texture, turgor normal, no rashes or lesions  Lymph nodes:   Cervical, supraclavicular, and axillary nodes normal  Neurologic:   CNII-XII intact, normal strength, sensation and gait; reflexes 2+ and symmetric throughout          Psych:   Normal mood, affect, hygiene and grooming.          Assessment & Plan:  Chronic right shoulder pain - Plan: DG Shoulder Right, Ambulatory referral to Orthopedic Surgery  Need for prophylactic vaccination and inoculation against influenza - Plan: Flu vaccine HIGH DOSE PF (Fluzone High dose)  Screening for osteoporosis - Plan: DG  Bone Density  Arthritis - Plan: CBC with Differential/Platelet, Comprehensive metabolic panel  Stress incontinence in female  Depression, unspecified depression type - Plan: buPROPion (WELLBUTRIN XL) 300 MG 24 hr tablet, FLUoxetine (PROZAC) 40 MG capsule  Attention deficit disorder, unspecified hyperactivity presence - Plan: amphetamine-dextroamphetamine (ADDERALL) 10 MG tablet, amphetamine-dextroamphetamine (ADDERALL) 10 MG tablet, amphetamine-dextroamphetamine (ADDERALL) 10 MG tablet  Need for hepatitis C screening test - Plan: Hepatitis C antibody There is a significant amount of crepitus with her shoulder and therefore don't think rehabilitation would be quite appropriate at the present time. I will refer her back  to orthopedics where she was seen before. Also time for a DEXA scan. We'll continue her on her other medications as she seems to be quite stable. Over 45 minutes spent discussing all these issues and in coordination of care.

## 2015-12-26 LAB — HEPATITIS C ANTIBODY: HCV AB: NEGATIVE

## 2016-01-23 ENCOUNTER — Ambulatory Visit
Admission: RE | Admit: 2016-01-23 | Discharge: 2016-01-23 | Disposition: A | Discharge status: Home / Self Care | Payer: Medicare Other | Source: Ambulatory Visit | Attending: Family Medicine | Admitting: Family Medicine

## 2016-01-23 ENCOUNTER — Other Ambulatory Visit: Payer: Self-pay

## 2016-01-23 DIAGNOSIS — M25511 Pain in right shoulder: Principal | ICD-10-CM

## 2016-01-23 DIAGNOSIS — M8580 Other specified disorders of bone density and structure, unspecified site: Secondary | ICD-10-CM

## 2016-01-23 DIAGNOSIS — G8929 Other chronic pain: Secondary | ICD-10-CM

## 2016-01-23 DIAGNOSIS — M19011 Primary osteoarthritis, right shoulder: Secondary | ICD-10-CM | POA: Diagnosis not present

## 2016-01-26 ENCOUNTER — Ambulatory Visit (INDEPENDENT_AMBULATORY_CARE_PROVIDER_SITE_OTHER): Payer: Medicare Other | Admitting: Orthopaedic Surgery

## 2016-01-26 DIAGNOSIS — M25511 Pain in right shoulder: Secondary | ICD-10-CM

## 2016-01-26 NOTE — Progress Notes (Signed)
   Office Visit Note   Patient: Megan Nunez           Date of Birth: 08/24/1945           MRN: [id number] Visit Date: 01/26/2016              Requested by: Ronnald NianJohn C Lalonde, MD [address] [place], [place] [id number] PCP: Carollee HerterLALONDE,JOHN CHARLES, MD   Assessment & Plan: Visit Diagnoses:  1. Acute pain of right shoulder     Plan: Impression is advanced glenohumeral degenerative joint disease. Patient has tried over-the-counter pain meds with partial relief. She will like to try cortisone injection with Dr. Alvester MorinNewton which we will set her up for otherwise she can follow up with us as needed  Follow-Up Instructions: Return if symptoms worsen or fail to improve.   Orders:  Orders Placed This Encounter  Procedures  . Ambulatory referral to Physical Medicine Rehab   No orders of the defined types were placed in this encounter.     Procedures: No procedures performed   Clinical Data: No additional findings.   Subjective: Chief Complaint  Patient presents with  . Right Shoulder - Pain    Patient is a 71 year old female that his left hand dominant with chronic right shoulder pain for many years. She denies any injuries. Pain is 6-710. She denies any injuries or surgeries. She has popping and grinding with range of motion of the shoulder. Denies any radiation of pain. Pain is a throbbing aching pain.    Review of Systems Complete review of systems negative except for history of present illness  Objective: Vital Signs: There were no vitals taken for this visit.  Physical Exam Well-developed well-nourished acute distress alert 3 nonlabored breathing normal judgments affect abdomen soft no lymphadenopathy Ortho Exam Exam of the right shoulder shows significant crepitus with movement of the glenohumeral joint. She has for flexion to about 100 external rotation to about 5. Rotator cuff is intact. Specialty Comments:  No specialty comments available.  Imaging: No  results found.   PMFS History: Patient Active Problem List   Diagnosis Date Noted  . Exertional dyspnea 03/20/2013  . Arthritis 07/25/2012  . Tinnitus 07/25/2012  . Stress incontinence in female 07/25/2012  . Depression 07/25/2012   Past Medical History:  Diagnosis Date  . Depression   . Obesity     Family History  Problem Relation Age of Onset  . Cancer Brother 50    pancreatic cancer  . Asthma Paternal Grandmother   . Heart disease Mother     Mitral valve disease.  . Dementia Mother   . Lung disease Father     Idiopathic pulmonary fibrosis  . Heart disease Maternal Grandmother     Died after complications of angioplasty.    Past Surgical History:  Procedure Laterality Date  . LAPAROSCOPIC TUBAL LIGATION  1985   Social History   Occupational History  . Not on file.   Social History Main Topics  . Smoking status: Never Smoker  . Smokeless tobacco: Never Used  . Alcohol use 0.6 oz/week    1 Glasses of wine per week  . Drug use: No  . Sexual activity: Not Currently

## 2016-02-02 ENCOUNTER — Encounter (INDEPENDENT_AMBULATORY_CARE_PROVIDER_SITE_OTHER): Payer: Self-pay | Admitting: Physical Medicine and Rehabilitation

## 2016-02-02 ENCOUNTER — Ambulatory Visit (INDEPENDENT_AMBULATORY_CARE_PROVIDER_SITE_OTHER): Payer: Medicare Other | Admitting: Physical Medicine and Rehabilitation

## 2016-02-02 VITALS — BP 142/74 | HR 70

## 2016-02-02 DIAGNOSIS — M25511 Pain in right shoulder: Secondary | ICD-10-CM | POA: Diagnosis not present

## 2016-02-02 DIAGNOSIS — G8929 Other chronic pain: Secondary | ICD-10-CM

## 2016-02-02 NOTE — Patient Instructions (Signed)

## 2016-02-02 NOTE — Progress Notes (Signed)
Megan Nunez - 71 y.o. female MRN [id number]  Date of birth: 07/21/1945  Office Visit Note: Visit Date: 02/02/2016 PCP: Carollee HerterLALONDE,JOHN CHARLES, MD Referred by: Ronnald NianLalonde, John C, MD  Subjective: Chief Complaint  Patient presents with  . Right Shoulder - Pain   HPI: Megan Nunez is a 71 year old female complaining of chronic but worsening right shoulder pain for several years. Pain with using arm. Denies pain radiating down arm or up neck. She has decreased range of motion with extension and abduction. She has x-rays showing glenohumeral joint arthritis. Dr. Roda ShuttersXu requests a diagnostic of therapeutic intra-articular shoulder injection with fluoroscopic guidance.    ROS Otherwise per HPI.  Assessment & Plan: Visit Diagnoses:  1. Chronic right shoulder pain     Plan: Findings:  Right intra-articular glenohumeral joint injection with fluoroscopic guidance. Patient did get increased range of motion through the anesthetic phase of the injection.    Meds & Orders: No orders of the defined types were placed in this encounter.   Orders Placed This Encounter  Procedures  . Large Joint Injection/Arthrocentesis    Follow-up: Return if symptoms worsen or fail to improve, 2weeks, for follow up with Dr. Roda ShuttersXu.   Procedures: Large Joint Inj Date/Time: 02/02/2016 3:19 PM Performed by: Tyrell AntonioNEWTON, Orvella Digiulio Authorized by: Tyrell AntonioNEWTON, Shamir Tuzzolino   Consent Given by:  Patient Site marked: the procedure site was marked   Timeout: prior to procedure the correct patient, procedure, and site was verified   Indications:  Pain and diagnostic evaluation Location:  Shoulder Site:  R glenohumeral Prep: patient was prepped and draped in usual sterile fashion   Needle Size:  22 G Needle Length:  3.5 inches Approach:  Anteromedial Ultrasound Guidance: No   Fluoroscopic Guidance: Yes   Arthrogram: No   Medications:  4 mL lidocaine 2 %; 80 mg triamcinolone acetonide 40 MG/ML Aspiration Attempted: Yes   Patient  tolerance:  Patient tolerated the procedure well with no immediate complications  There was excellent flow of contrast producing a partial arthrogram of the glenohumeral joint. The patient did have relief of her symptoms during the anesthetic phase of the injection.    No notes on file   Clinical History: EXAM: RIGHT SHOULDER - 2+ VIEW  COMPARISON:  06/28/2013  FINDINGS: Three views study shows no fracture. No subluxation or dislocation. No evidence for shoulder separation. Loss of joint space noted in the glenohumeral joint with associated prominent hypertrophic spurring.  IMPRESSION: Osteoarthritis of the glenohumeral joint without acute bony findings.   Electronically Signed   By: Kennith CenterEric  Mansell M.D.   On: 01/23/2016 09:27  She reports that she has never smoked. She has never used smokeless tobacco. No results for input(s): HGBA1C, LABURIC in the last 8760 hours.  Objective:  VS:  HT:    WT:   BMI:     BP:(!) 142/74  HR:70bpm  TEMP: ( )  RESP:96 % Physical Exam  Musculoskeletal:  Patient had significantly decreased range of motion with shoulder abduction and extension.    Ortho Exam Imaging: No results found.  Past Medical/Family/Surgical/Social History: Medications & Allergies reviewed per EMR Patient Active Problem List   Diagnosis Date Noted  . Exertional dyspnea 03/20/2013  . Arthritis 07/25/2012  . Tinnitus 07/25/2012  . Stress incontinence in female 07/25/2012  . Depression 07/25/2012   Past Medical History:  Diagnosis Date  . Depression   . Obesity    Family History  Problem Relation Age of Onset  . Cancer Brother 1750  pancreatic cancer  . Asthma Paternal Grandmother   . Heart disease Mother     Mitral valve disease.  . Dementia Mother   . Lung disease Father     Idiopathic pulmonary fibrosis  . Heart disease Maternal Grandmother     Died after complications of angioplasty.   Past Surgical History:  Procedure Laterality Date  .  LAPAROSCOPIC TUBAL LIGATION  1985   Social History   Occupational History  . Not on file.   Social History Main Topics  . Smoking status: Never Smoker  . Smokeless tobacco: Never Used  . Alcohol use 0.6 oz/week    1 Glasses of wine per week  . Drug use: No  . Sexual activity: Not Currently

## 2016-02-03 MED ORDER — LIDOCAINE HCL 2 % IJ SOLN
4.0000 mL | INTRAMUSCULAR | Status: AC | PRN
  Administered 2016-02-02: 4 mL

## 2016-02-03 MED ORDER — TRIAMCINOLONE ACETONIDE 40 MG/ML IJ SUSP
80.0000 mg | INTRAMUSCULAR | Status: AC | PRN
  Administered 2016-02-02: 80 mg via INTRA_ARTICULAR

## 2016-03-30 ENCOUNTER — Telehealth: Payer: Self-pay | Admitting: Family Medicine

## 2016-03-30 NOTE — Telephone Encounter (Signed)
P.A. DEXTROAMP-AMPHETAMINE

## 2016-04-05 NOTE — Telephone Encounter (Addendum)
Chart states Vyvanse tried, called pt states nothing else tried, completed P.A.

## 2016-04-10 NOTE — Telephone Encounter (Signed)
P.A. Approved til 04/05/17

## 2016-04-15 NOTE — Telephone Encounter (Signed)
pt informed

## 2016-04-16 ENCOUNTER — Ambulatory Visit (HOSPITAL_COMMUNITY)
Admission: EM | Admit: 2016-04-16 | Discharge: 2016-04-16 | Disposition: A | Discharge status: Home / Self Care | Payer: Medicare Other | Source: Home / Self Care | Attending: Internal Medicine | Admitting: Internal Medicine

## 2016-04-16 ENCOUNTER — Emergency Department (HOSPITAL_COMMUNITY): Payer: Medicare Other

## 2016-04-16 ENCOUNTER — Encounter (HOSPITAL_COMMUNITY): Payer: Self-pay | Admitting: *Deleted

## 2016-04-16 ENCOUNTER — Encounter (HOSPITAL_COMMUNITY): Payer: Self-pay | Admitting: Adult Health

## 2016-04-16 ENCOUNTER — Emergency Department (HOSPITAL_COMMUNITY)
Admission: EM | Admit: 2016-04-16 | Discharge: 2016-04-16 | Disposition: A | Discharge status: Home / Self Care | Payer: Medicare Other | Attending: Emergency Medicine | Admitting: Emergency Medicine

## 2016-04-16 DIAGNOSIS — S0083XA Contusion of other part of head, initial encounter: Secondary | ICD-10-CM | POA: Diagnosis not present

## 2016-04-16 DIAGNOSIS — S0990XA Unspecified injury of head, initial encounter: Secondary | ICD-10-CM

## 2016-04-16 DIAGNOSIS — S80211A Abrasion, right knee, initial encounter: Secondary | ICD-10-CM

## 2016-04-16 DIAGNOSIS — W010XXA Fall on same level from slipping, tripping and stumbling without subsequent striking against object, initial encounter: Secondary | ICD-10-CM

## 2016-04-16 DIAGNOSIS — Y9301 Activity, walking, marching and hiking: Secondary | ICD-10-CM | POA: Diagnosis not present

## 2016-04-16 DIAGNOSIS — S0081XA Abrasion of other part of head, initial encounter: Secondary | ICD-10-CM

## 2016-04-16 DIAGNOSIS — S0093XA Contusion of unspecified part of head, initial encounter: Secondary | ICD-10-CM

## 2016-04-16 DIAGNOSIS — Y9289 Other specified places as the place of occurrence of the external cause: Secondary | ICD-10-CM | POA: Diagnosis not present

## 2016-04-16 DIAGNOSIS — R220 Localized swelling, mass and lump, head: Secondary | ICD-10-CM | POA: Diagnosis not present

## 2016-04-16 DIAGNOSIS — S022XXB Fracture of nasal bones, initial encounter for open fracture: Secondary | ICD-10-CM | POA: Diagnosis not present

## 2016-04-16 DIAGNOSIS — S0121XA Laceration without foreign body of nose, initial encounter: Secondary | ICD-10-CM | POA: Diagnosis not present

## 2016-04-16 DIAGNOSIS — W0110XA Fall on same level from slipping, tripping and stumbling with subsequent striking against unspecified object, initial encounter: Secondary | ICD-10-CM | POA: Diagnosis not present

## 2016-04-16 DIAGNOSIS — Y999 Unspecified external cause status: Secondary | ICD-10-CM | POA: Insufficient documentation

## 2016-04-16 DIAGNOSIS — W19XXXA Unspecified fall, initial encounter: Secondary | ICD-10-CM

## 2016-04-16 DIAGNOSIS — S022XXA Fracture of nasal bones, initial encounter for closed fracture: Secondary | ICD-10-CM | POA: Diagnosis not present

## 2016-04-16 DIAGNOSIS — S0181XA Laceration without foreign body of other part of head, initial encounter: Secondary | ICD-10-CM

## 2016-04-16 MED ORDER — ACETAMINOPHEN 325 MG PO TABS
ORAL_TABLET | ORAL | Status: AC
  Filled 2016-04-16: qty 2

## 2016-04-16 MED ORDER — LIDOCAINE HCL 2 % IJ SOLN
10.0000 mL | Freq: Once | INTRAMUSCULAR | Status: AC
  Administered 2016-04-16: 200 mg via INTRADERMAL
  Filled 2016-04-16: qty 20

## 2016-04-16 MED ORDER — ACETAMINOPHEN 325 MG PO TABS
650.0000 mg | ORAL_TABLET | Freq: Once | ORAL | Status: AC
  Administered 2016-04-16: 650 mg via ORAL

## 2016-04-16 NOTE — ED Notes (Signed)
Suture cart placed at bedside. 

## 2016-04-16 NOTE — ED Triage Notes (Addendum)
Presents post fall at 10:30 AM while carrying boxes [place] a porch at [place], tripped over a stair and fell forward onto concrete porch hitting forehead and nose-pt recall entire incident, denies LOC, denies severe pain at this time or dizziness. Endorses slight nausea while at [facility] but that hs passed. Alert, oreinetd and PERRLA. Hematoma to forehead, bruising to bridge of nose and laceration to nose and forehead. Denies blurred vision and diplopia. Denies blood thinners, denies neck pain.

## 2016-04-16 NOTE — ED Provider Notes (Signed)
[facility] Provider Note   CSN: [id number] Arrival date & time: 04/16/16  1245     History   Chief Complaint Chief Complaint  Patient presents with  . Fall    HPI Megan Nunez is a 71 y.o. female.  HPI Megan Nunez is a 71 y.o. female presents to emergency department complaining of a fall. Patient states she was walking and carrying a bench when she stepped off of an even surface and tripped falling forward onto her face. She states she did not catch herself because she was holding something with her hands. She states she fell forward and hit her face on the pavement. She denies getting dizzy or lightheaded prior to the fall. She denies loss of consciousness. She states she was helped up and noticed that she was bleeding from her nose. Pressure was applied to to her face and she went initially to urgent care for evaluation. Patient states she was sent here for imaging. Patient reports mild headache, states it is 3 out of 10. She reports pain and swelling to the nose and around her eyes. She states she is not able to wear her glasses due to swelling over her face. She denies any other injuries. She denies any pain in her neck or back. She denies any pain to her extremities. She denies any changes in vision. She denies any trouble ambulating. She states she thinks her tetanus is up-to-date.  Past Medical History:  Diagnosis Date  . Depression   . Obesity     Patient Active Problem List   Diagnosis Date Noted  . Exertional dyspnea 03/20/2013  . Arthritis 07/25/2012  . Tinnitus 07/25/2012  . Stress incontinence in female 07/25/2012  . Depression 07/25/2012    Past Surgical History:  Procedure Laterality Date  . LAPAROSCOPIC TUBAL LIGATION  1985    OB History    No data available       Home Medications    Prior to Admission medications   Medication Sig Start Date End Date Taking? Authorizing Provider  amphetamine-dextroamphetamine (ADDERALL) 10 MG tablet Take 1  tablet (10 mg total) by mouth 2 (two) times daily. 12/25/15   Ronnald Nian, MD  amphetamine-dextroamphetamine (ADDERALL) 10 MG tablet Take 1 tablet (10 mg total) by mouth 2 (two) times daily. 01/25/16   Ronnald Nian, MD  amphetamine-dextroamphetamine (ADDERALL) 10 MG tablet Take 1 tablet (10 mg total) by mouth 2 (two) times daily. 02/25/16   Ronnald Nian, MD  Biotin (BIOTIN 5000) 5 MG CAPS Take 2 capsules by mouth daily.     Historical Provider, MD  buPROPion (WELLBUTRIN XL) 300 MG 24 hr tablet Take 1 tablet (300 mg total) by mouth daily. 12/25/15   Ronnald Nian, MD  Fish Oil-Cholecalciferol (FISH OIL + D3 PO) Take by mouth.    Historical Provider, MD  FLUoxetine (PROZAC) 40 MG capsule Take 1 capsule (40 mg total) by mouth daily. 12/25/15   Ronnald Nian, MD  Glucosamine-Chondroitin 750-600 MG CHEW Chew 2 tablets by mouth daily.    Historical Provider, MD  Multiple Vitamins-Minerals (MULTIVITAMIN WITH MINERALS) tablet Take 1 tablet by mouth daily.    Historical Provider, MD  Turmeric Curcumin 500 MG CAPS Take by mouth.    Historical Provider, MD  vitamin B-12 (CYANOCOBALAMIN) 1000 MCG tablet Take 1,000 mcg by mouth daily.    Historical Provider, MD    Family History Family History  Problem Relation Age of Onset  . Cancer Brother 102  pancreatic cancer  . Asthma Paternal Grandmother   . Heart disease Mother     Mitral valve disease.  . Dementia Mother   . Lung disease Father     Idiopathic pulmonary fibrosis  . Heart disease Maternal Grandmother     Died after complications of angioplasty.    Social History Social History  Substance Use Topics  . Smoking status: Never Smoker  . Smokeless tobacco: Never Used  . Alcohol use 0.6 oz/week    1 Glasses of wine per week     Allergies   Patient has no known allergies.   Review of Systems Review of Systems  Constitutional: Negative for chills and fever.  Respiratory: Negative for cough, chest tightness and shortness of breath.     Cardiovascular: Negative for chest pain, palpitations and leg swelling.  Gastrointestinal: Negative for abdominal pain, diarrhea, nausea and vomiting.  Musculoskeletal: Negative for arthralgias, myalgias, neck pain and neck stiffness.  Skin: Positive for wound. Negative for rash.  Neurological: Positive for headaches. Negative for dizziness, syncope, weakness and light-headedness.  All other systems reviewed and are negative.    Physical Exam Updated Vital Signs BP (!) 149/70 (BP Location: Left Arm)   Pulse 68   Temp 98.6 F (37 C) (Oral)   Resp 18   Ht  (1.549 m)   SpO2 97%   Physical Exam  Constitutional: She is oriented to person, place, and time. She appears well-developed and well-nourished. No distress.  HENT:  Head: Normocephalic.  Right Ear: External ear normal.  Left Ear: External ear normal.  Nose: Nose normal.  Mouth/Throat: Oropharynx is clear and moist.  Abrasion and hematoma to the center of the forehead. There is an irregular/Stellite laceration to the bridge of the nose with active bleeding. Nasal passages appear to be normal with no active bleeding. No septal hematoma. There is bruising and swelling over the bridge of the nose extending under bilateral eyes. TMs are normal bilaterally with no hemotympanum.  Eyes: Conjunctivae are normal.  Neck: Neck supple.  No midline cervical spine tenderness, full range of motion of the neck.  Cardiovascular: Normal rate, regular rhythm and normal heart sounds.   Pulmonary/Chest: Effort normal and breath sounds normal. No respiratory distress. She has no wheezes. She has no rales.  Abdominal: Soft. Bowel sounds are normal. She exhibits no distension. There is no tenderness. There is no rebound.  Musculoskeletal: She exhibits no edema.  Full range of motion of bilateral upper and lower extremities. No midline thoracic or lumbar spine tenderness.  Neurological: She is alert and oriented to person, place, and time.  5/5  and equal upper and lower extremity strength bilaterally. Equal grip strength bilaterally. Normal finger to nose and heel to shin. No pronator drift. Patellar reflexes 2+   Skin: Skin is warm and dry.  Psychiatric: She has a normal mood and affect. Her behavior is normal.  Nursing note and vitals reviewed.    ED Treatments / Results  Labs (all labs ordered are listed, but only abnormal results are displayed) Labs Reviewed - No data to display  EKG  EKG Interpretation None       Radiology Ct Head Wo Contrast  Result Date: 04/16/2016 CLINICAL DATA:  Patient fell on face. Bloody nose. Left frontal abrasion. EXAM: CT HEAD WITHOUT CONTRAST CT MAXILLOFACIAL WITHOUT CONTRAST TECHNIQUE: Multidetector CT imaging of the head and maxillofacial structures were performed using the standard protocol without intravenous contrast. Multiplanar CT image reconstructions of the maxillofacial structures were  also generated. COMPARISON:  None. FINDINGS: CT HEAD FINDINGS Brain: No evidence of acute infarction, hemorrhage, hydrocephalus, extra-axial collection or mass lesion/mass effect. Vascular: No hyperdense vessel or unexpected calcification. Skull: Normal. Negative for fracture or focal lesion. Nasal tip fracture associated with soft tissue swelling of the nose. Other: Soft tissue swelling of the forehead. Clear paranasal sinuses. CT MAXILLOFACIAL FINDINGS Osseous: Intact TMJ. Right-sided nasal tip fracture minimally depressed associated soft tissue swelling and abrasions. There is degenerative disc space narrowing of the visualized cervical spine from C4 through C7 with small posterior marginal osteophytes most prominent at C5-6. Orbits: Negative. No traumatic or inflammatory finding. Sinuses: Mild ethmoid, sphenoid and left maxillary sinus mucosal thickening. Soft tissues: Soft tissue swelling overlying the nasal tip fracture with abrasions. Forehead soft tissue swelling. IMPRESSION: 1. No acute intracranial  abnormality. 2. Forehead soft tissue swelling without underlying fracture. 3. Minimally depressed right nasal tip fracture with associated soft tissue swelling and abrasions. 4. Cervical degenerative disc disease of the visualized cervical spine C4 through C7. Electronically Signed   By: Tollie Eth M.D.   On: 04/16/2016 13:57   Ct Maxillofacial Wo Contrast  Result Date: 04/16/2016 CLINICAL DATA:  Patient fell on face. Bloody nose. Left frontal abrasion. EXAM: CT HEAD WITHOUT CONTRAST CT MAXILLOFACIAL WITHOUT CONTRAST TECHNIQUE: Multidetector CT imaging of the head and maxillofacial structures were performed using the standard protocol without intravenous contrast. Multiplanar CT image reconstructions of the maxillofacial structures were also generated. COMPARISON:  None. FINDINGS: CT HEAD FINDINGS Brain: No evidence of acute infarction, hemorrhage, hydrocephalus, extra-axial collection or mass lesion/mass effect. Vascular: No hyperdense vessel or unexpected calcification. Skull: Normal. Negative for fracture or focal lesion. Nasal tip fracture associated with soft tissue swelling of the nose. Other: Soft tissue swelling of the forehead. Clear paranasal sinuses. CT MAXILLOFACIAL FINDINGS Osseous: Intact TMJ. Right-sided nasal tip fracture minimally depressed associated soft tissue swelling and abrasions. There is degenerative disc space narrowing of the visualized cervical spine from C4 through C7 with small posterior marginal osteophytes most prominent at C5-6. Orbits: Negative. No traumatic or inflammatory finding. Sinuses: Mild ethmoid, sphenoid and left maxillary sinus mucosal thickening. Soft tissues: Soft tissue swelling overlying the nasal tip fracture with abrasions. Forehead soft tissue swelling. IMPRESSION: 1. No acute intracranial abnormality. 2. Forehead soft tissue swelling without underlying fracture. 3. Minimally depressed right nasal tip fracture with associated soft tissue swelling and  abrasions. 4. Cervical degenerative disc disease of the visualized cervical spine C4 through C7. Electronically Signed   By: Tollie Eth M.D.   On: 04/16/2016 13:57    Procedures Procedures (including critical care time)  LACERATION REPAIR Performed by: Jaynie Crumble A Authorized by: Jaynie Crumble A Consent: Verbal consent obtained. Risks and benefits: risks, benefits and alternatives were discussed Consent given by: patient Patient identity confirmed: provided demographic data Prepped and Draped in normal sterile fashion Wound explored  Laceration Location: bridge of the nose  Laceration Length: 3cm  No Foreign Bodies seen or palpated  Anesthesia: local infiltration  Local anesthetic: lidocaine 2% wo epinephrine  Anesthetic total: 2 ml  Irrigation method: syringe Amount of cleaning: standard  Skin closure: vicril 6.0  Number of sutures: 4  Technique: simple interrupted.   Patient tolerance: Patient tolerated the procedure well with no immediate complications.  Medications Ordered in ED Medications  acetaminophen (TYLENOL) 325 MG tablet (not administered)  lidocaine (XYLOCAINE) 2 % (with pres) injection 200 mg (not administered)  acetaminophen (TYLENOL) tablet 650 mg (650 mg Oral Given 04/16/16 1315)  Initial Impression / Assessment and Plan / ED Course  I have reviewed the triage vital signs and the nursing notes.  Pertinent labs & imaging results that were available during my care of the patient were reviewed by me and considered in my medical decision making (see chart for details).     Patient in emergency department after a mechanical fall. States she tripped over uneven surface of the walkway while holding a bench. She is complaining of headache and facial pain with several abrasions and a laceration to the bridge of the nose. There was no loss of consciousness. She was sent here by urgent care for imaging. She has no other injuries. Accident  occurred about 5 hours ago. Patient states her headache and pain is mild. She was given Tylenol prior to me seeing her. Patient already had CT head and maxillofacial done which showed minimally depressed right nasal tip fracture, otherwise no cervical, maxillofacial, intracranial injury. Patient has normal neurological exam. She does have significant swelling to the face. I will repair laceration to the nasal bridge. There is no septal hematoma.   4:59 PM Bridge of the nose laceration repaired with sutures. Patient tolerated procedure well. Bleeding subsided. I also used Dermabond to repair a superficial laceration to the forehead. Discussed with Dr.Liu, who has seen patient as well. Patient is stable for discharge home. Tylenol or Motrin for pain, ice pack to the face every few hours, follow up as needed. Return precautions discussed.  Vitals:   04/16/16 1256 04/16/16 1303  BP: (!) 149/70   Pulse: 68   Resp: 18   Temp: 98.6 F (37 C)   TempSrc: Oral   SpO2: 97%   Height:   (1.549 m)     Final Clinical Impressions(s) / ED Diagnoses   Final diagnoses:  Open fracture of nasal bone, initial encounter  Minor head injury, initial encounter  Facial hematoma, initial encounter    New Prescriptions New Prescriptions   No medications on file     Jaynie Crumble, PA-C 04/16/16 1702    Lavera Guise, MD 04/16/16 [phone]

## 2016-04-16 NOTE — ED Notes (Signed)
PA at bedside.

## 2016-04-16 NOTE — ED Provider Notes (Signed)
[facility]    CSN: [id number] Arrival date & time: 04/16/16  1113     History   Chief Complaint Chief Complaint  Patient presents with  . Fall    HPI Megan Nunez is a 71 y.o. female.   71 yo female c/o suffering a fall  Immediately prior to presenting to clinic.  She reports tripping over sunken flagstones at [place].  She denies fainting before the fall or LOC after the fall.  The incident was witnessed. She sustained cuts and bruising to her face.  Denies chest pain, palpitations, N/V/D but admits to dizziness upon standing.      Past Medical History:  Diagnosis Date  . Depression   . Obesity     Patient Active Problem List   Diagnosis Date Noted  . Exertional dyspnea 03/20/2013  . Arthritis 07/25/2012  . Tinnitus 07/25/2012  . Stress incontinence in female 07/25/2012  . Depression 07/25/2012    Past Surgical History:  Procedure Laterality Date  . LAPAROSCOPIC TUBAL LIGATION  1985    OB History    No data available       Home Medications    Prior to Admission medications   Medication Sig Start Date End Date Taking? Authorizing Provider  amphetamine-dextroamphetamine (ADDERALL) 10 MG tablet Take 1 tablet (10 mg total) by mouth 2 (two) times daily. 12/25/15   Ronnald Nian, MD  amphetamine-dextroamphetamine (ADDERALL) 10 MG tablet Take 1 tablet (10 mg total) by mouth 2 (two) times daily. 01/25/16   Ronnald Nian, MD  amphetamine-dextroamphetamine (ADDERALL) 10 MG tablet Take 1 tablet (10 mg total) by mouth 2 (two) times daily. 02/25/16   Ronnald Nian, MD  Biotin (BIOTIN 5000) 5 MG CAPS Take 2 capsules by mouth daily.     Historical Provider, MD  buPROPion (WELLBUTRIN XL) 300 MG 24 hr tablet Take 1 tablet (300 mg total) by mouth daily. 12/25/15   Ronnald Nian, MD  Fish Oil-Cholecalciferol (FISH OIL + D3 PO) Take by mouth.    Historical Provider, MD  FLUoxetine (PROZAC) 40 MG capsule Take 1 capsule (40 mg total) by mouth daily. 12/25/15   Ronnald Nian, MD  Glucosamine-Chondroitin 750-600 MG CHEW Chew 2 tablets by mouth daily.    Historical Provider, MD  Multiple Vitamins-Minerals (MULTIVITAMIN WITH MINERALS) tablet Take 1 tablet by mouth daily.    Historical Provider, MD  Turmeric Curcumin 500 MG CAPS Take by mouth.    Historical Provider, MD  vitamin B-12 (CYANOCOBALAMIN) 1000 MCG tablet Take 1,000 mcg by mouth daily.    Historical Provider, MD    Family History Family History  Problem Relation Age of Onset  . Cancer Brother 50    pancreatic cancer  . Asthma Paternal Grandmother   . Heart disease Mother     Mitral valve disease.  . Dementia Mother   . Lung disease Father     Idiopathic pulmonary fibrosis  . Heart disease Maternal Grandmother     Died after complications of angioplasty.    Social History Social History  Substance Use Topics  . Smoking status: Never Smoker  . Smokeless tobacco: Never Used  . Alcohol use 0.6 oz/week    1 Glasses of wine per week     Allergies   Patient has no known allergies.   Review of Systems Review of Systems  Constitutional: Negative for chills and fever.  HENT: Negative for sore throat and tinnitus.   Eyes: Negative for redness.  Respiratory:  Negative for cough and shortness of breath.   Cardiovascular: Negative for chest pain and palpitations.  Gastrointestinal: Negative for abdominal pain, diarrhea, nausea and vomiting.  Genitourinary: Negative for dysuria, frequency and urgency.  Musculoskeletal: Negative for myalgias.  Skin: Negative for rash.       No lesions  Neurological: Positive for dizziness. Negative for syncope and weakness.  Hematological: Does not bruise/bleed easily.  Psychiatric/Behavioral: Negative for suicidal ideas.     Physical Exam Triage Vital Signs ED Triage Vitals  Enc Vitals Group     BP 04/16/16 1152 (!) 152/71     Pulse Rate 04/16/16 1152 64     Resp 04/16/16 1152 18     Temp 04/16/16 1152 98.6 F (37 C)     Temp Source 04/16/16  1152 Oral     SpO2 04/16/16 1152 98 %     Weight --      Height --      Head Circumference --      Peak Flow --      Pain Score 04/16/16 1156 2     Pain Loc --      Pain Edu? --      Excl. in GC? --    No data found.   Updated Vital Signs BP (!) 152/71 (BP Location: Right Arm)   Pulse 64   Temp 98.6 F (37 C) (Oral)   Resp 18   SpO2 98%   Visual Acuity Right Eye Distance:   Left Eye Distance:   Bilateral Distance:    Right Eye Near:   Left Eye Near:    Bilateral Near:     Physical Exam  Constitutional: She is oriented to person, place, and time. She appears well-developed and well-nourished. No distress.  HENT:  Head: Normocephalic. Head is with contusion and with laceration. Head is without raccoon's eyes and without Battle's sign.    Mouth/Throat: Oropharynx is clear and moist.  Eyes: Conjunctivae and EOM are normal. Pupils are equal, round, and reactive to light. No scleral icterus.  Neck: Normal range of motion. Neck supple. No JVD present. No tracheal deviation present. No thyromegaly present.  Cardiovascular: Normal rate, regular rhythm and normal heart sounds.  Exam reveals no gallop and no friction rub.   No murmur heard. Pulmonary/Chest: Effort normal and breath sounds normal.  Abdominal: Soft. Bowel sounds are normal. She exhibits no distension. There is no tenderness.  Musculoskeletal: Normal range of motion. She exhibits no edema.  Lymphadenopathy:    She has no cervical adenopathy.  Neurological: She is alert and oriented to person, place, and time. No cranial nerve deficit.  Skin: Skin is warm and dry.  Psychiatric: She has a normal mood and affect. Her behavior is normal. Judgment and thought content normal.  Nursing note and vitals reviewed.    UC Treatments / Results  Labs (all labs ordered are listed, but only abnormal results are displayed) Labs Reviewed - No data to display  EKG  EKG Interpretation None       Radiology No results  found.  Procedures Procedures (including critical care time)  Medications Ordered in UC Medications - No data to display   Initial Impression / Assessment and Plan / UC Course  I have reviewed the triage vital signs and the nursing notes.  Pertinent labs & imaging results that were available during my care of the patient were reviewed by me and considered in my medical decision making (see chart for details).  Mechanical fall leading to facial lacerations and likely nasal fracture with associated hematoma to head= concussion.  Patient needs head CT but also r/o syncope as she has a h/o falls.  No neurological deficits or altered mental status at this time.  No battle sign.  Final Clinical Impressions(s) / UC Diagnoses   Final diagnoses:  Fall, initial encounter  Contusion of head, unspecified part of head, initial encounter  Facial laceration, initial encounter    New Prescriptions New Prescriptions   No medications on file     Arnaldo Natal, MD 04/16/16 1249

## 2016-04-16 NOTE — Discharge Instructions (Signed)
Continue to ice your face several times a day. Take ibuprofen or Tylenol for pain. Keep lacerations and abrasions clean, you can apply bacitracin ointment twice a day. If sutures do not absorb and 7 days, make sure to follow-up for removal. Return if any signs of infection or worsening symptoms.

## 2016-04-16 NOTE — ED Triage Notes (Signed)
Larey Seat  Today  Did  Not  Black  Out  Pt  Is  Not  On  Any  Blood  Thinners      She  Is  Awake  And  Alert  Abrasions  To face  And  r   Knee

## 2016-04-29 ENCOUNTER — Telehealth: Payer: Self-pay | Admitting: Family Medicine

## 2016-04-29 NOTE — Telephone Encounter (Signed)
Called and left message for pt to call our office to schedule a Medicare Well Visit

## 2016-05-06 ENCOUNTER — Ambulatory Visit
Admission: RE | Admit: 2016-05-06 | Discharge: 2016-05-06 | Disposition: A | Discharge status: Home / Self Care | Payer: Medicare Other | Source: Ambulatory Visit | Attending: Family Medicine | Admitting: Family Medicine

## 2016-05-06 DIAGNOSIS — M8580 Other specified disorders of bone density and structure, unspecified site: Secondary | ICD-10-CM

## 2016-05-06 DIAGNOSIS — Z13820 Encounter for screening for osteoporosis: Secondary | ICD-10-CM | POA: Diagnosis not present

## 2016-05-06 DIAGNOSIS — Z780 Asymptomatic menopausal state: Secondary | ICD-10-CM | POA: Diagnosis not present

## 2016-06-28 ENCOUNTER — Ambulatory Visit: Payer: Medicare Other | Admitting: Family Medicine

## 2016-07-05 ENCOUNTER — Encounter (HOSPITAL_COMMUNITY): Payer: Self-pay | Admitting: Family Medicine

## 2016-07-05 ENCOUNTER — Ambulatory Visit (INDEPENDENT_AMBULATORY_CARE_PROVIDER_SITE_OTHER): Payer: Self-pay

## 2016-07-05 ENCOUNTER — Ambulatory Visit (HOSPITAL_COMMUNITY)
Admission: EM | Admit: 2016-07-05 | Discharge: 2016-07-05 | Disposition: A | Discharge status: Home / Self Care | Payer: Self-pay | Attending: Family Medicine | Admitting: Family Medicine

## 2016-07-05 DIAGNOSIS — S82141A Displaced bicondylar fracture of right tibia, initial encounter for closed fracture: Secondary | ICD-10-CM

## 2016-07-05 DIAGNOSIS — M25561 Pain in right knee: Secondary | ICD-10-CM

## 2016-07-05 DIAGNOSIS — S61210A Laceration without foreign body of right index finger without damage to nail, initial encounter: Secondary | ICD-10-CM

## 2016-07-05 DIAGNOSIS — S86911A Strain of unspecified muscle(s) and tendon(s) at lower leg level, right leg, initial encounter: Secondary | ICD-10-CM

## 2016-07-05 MED ORDER — HYDROCODONE-ACETAMINOPHEN 5-325 MG PO TABS: 1.0000 | ORAL_TABLET | Freq: Four times a day (QID) | ORAL | 0 refills | Status: DC | PRN

## 2016-07-05 NOTE — Discharge Instructions (Addendum)
Your index finger should heal over the next 7-10 days with the Dermabond treatment applied here in the office. The Dermabond will flake off on its own and you may wash and bathe normally.  You have a tibial fracture of the right knee. You need to keep the knee is full extension until you see Dr. Roda ShuttersXu. Please keep the immobilizer on at all times with NO weight bear to the leg. May require crutches or a walker. Ice 20 minutes out of every hour while awake. Keep elevated. May use Hydrocodone every 4-6 hours for pain. Please schedule a f/u with Dr. Roda ShuttersXu early next week for further extensive evaluation.  Nice to meet you.
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
## 2016-07-06 ENCOUNTER — Ambulatory Visit (INDEPENDENT_AMBULATORY_CARE_PROVIDER_SITE_OTHER): Payer: Medicare Other | Admitting: Orthopaedic Surgery

## 2016-07-06 ENCOUNTER — Encounter (INDEPENDENT_AMBULATORY_CARE_PROVIDER_SITE_OTHER): Payer: Self-pay | Admitting: Orthopaedic Surgery

## 2016-07-06 DIAGNOSIS — S82121A Displaced fracture of lateral condyle of right tibia, initial encounter for closed fracture: Secondary | ICD-10-CM

## 2016-07-06 MED ORDER — HYDROCODONE-ACETAMINOPHEN 5-325 MG PO TABS: 1.0000 | ORAL_TABLET | Freq: Four times a day (QID) | ORAL | 0 refills | Status: DC | PRN
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
## 2016-07-29 ENCOUNTER — Ambulatory Visit (INDEPENDENT_AMBULATORY_CARE_PROVIDER_SITE_OTHER): Payer: Medicare Other | Admitting: Orthopaedic Surgery

## 2016-07-29 ENCOUNTER — Encounter (INDEPENDENT_AMBULATORY_CARE_PROVIDER_SITE_OTHER): Payer: Self-pay | Admitting: Orthopaedic Surgery

## 2016-07-29 DIAGNOSIS — S82121A Displaced fracture of lateral condyle of right tibia, initial encounter for closed fracture: Secondary | ICD-10-CM

## 2016-07-29 NOTE — Progress Notes (Signed)
Patient is following up for her right nondisplaced tibial plateau fracture. She still has a little bit of pain but overall improving. Range of motion is excellent. Collaterals and cruciates are stable. Patient may increase her activity as tolerated. Prescription for TED hose and quad cane was given today. Questions encouraged and answered. Follow-up in 4 weeks with 2 view x-rays of the right knee.

## 2016-07-30 ENCOUNTER — Ambulatory Visit (INDEPENDENT_AMBULATORY_CARE_PROVIDER_SITE_OTHER): Payer: Medicare Other | Admitting: Orthopaedic Surgery

## 2016-08-26 ENCOUNTER — Ambulatory Visit (INDEPENDENT_AMBULATORY_CARE_PROVIDER_SITE_OTHER): Payer: No Typology Code available for payment source | Admitting: Orthopaedic Surgery

## 2016-09-07 ENCOUNTER — Ambulatory Visit (INDEPENDENT_AMBULATORY_CARE_PROVIDER_SITE_OTHER): Payer: Medicare Other | Admitting: Orthopaedic Surgery

## 2016-09-09 ENCOUNTER — Ambulatory Visit (INDEPENDENT_AMBULATORY_CARE_PROVIDER_SITE_OTHER): Payer: Medicare Other

## 2016-09-09 ENCOUNTER — Ambulatory Visit (INDEPENDENT_AMBULATORY_CARE_PROVIDER_SITE_OTHER): Payer: Medicare Other | Admitting: Orthopaedic Surgery

## 2016-09-09 DIAGNOSIS — S82121A Displaced fracture of lateral condyle of right tibia, initial encounter for closed fracture: Secondary | ICD-10-CM | POA: Insufficient documentation

## 2016-09-09 DIAGNOSIS — M1711 Unilateral primary osteoarthritis, right knee: Secondary | ICD-10-CM | POA: Diagnosis not present

## 2016-09-09 DIAGNOSIS — M19011 Primary osteoarthritis, right shoulder: Secondary | ICD-10-CM | POA: Diagnosis not present

## 2016-09-09 NOTE — Progress Notes (Signed)
Office Visit Note   Patient: Megan Nunez           Date of Birth: [birth year]           MRN: [id number] Visit Date: 09/09/2016              Requested by: Ronnald Nian, MD [address] [place], [place] [address] PCP: Ronnald Nian, MD   Assessment & Plan: Visit Diagnoses:  1. Primary osteoarthritis of right knee   2. Closed fracture of lateral portion of right tibial plateau, initial encounter   3. Primary osteoarthritis, right shoulder     Plan: Patient has advanced degenerative joint disease of both her right shoulder and knee. Her fracture appears to be healing just fine. I think her aching and throbbing is more related to her underlying arthritis of her right knee. I'm happy to give her an injection in about a month once her fracture has fully healed. I gave her a referral for outpatient physical therapy for her shoulder and right knee. Questions encouraged and answered. Follow-up as needed.  Follow-Up Instructions: Return if symptoms worsen or fail to improve.   Orders:  Orders Placed This Encounter  Procedures  . XR Knee 1-2 Views Right   No orders of the defined types were placed in this encounter.     Procedures: No procedures performed   Clinical Data: No additional findings.   Subjective: No chief complaint on file.   Patient follows up today for her minimally displaced lateral tibial plateau fracture and right knee osteoarthritis and right shoulder osteoarthritis. She complains of right shoulder pain with exercising. She is also having increased discomfort with prolonged standing and walking of her right knee.    Review of Systems  Constitutional: Negative.   HENT: Negative.   Eyes: Negative.   Respiratory: Negative.   Cardiovascular: Negative.   Endocrine: Negative.   Musculoskeletal: Negative.   Neurological: Negative.   Hematological: Negative.   Psychiatric/Behavioral: Negative.   All other systems reviewed and are  negative.    Objective: Vital Signs: There were no vitals taken for this visit.  Physical Exam  Constitutional: She is oriented to person, place, and time. She appears well-developed and well-nourished.  Pulmonary/Chest: Effort normal.  Neurological: She is alert and oriented to person, place, and time.  Skin: Skin is warm. Capillary refill takes less than 2 seconds.  Psychiatric: She has a normal mood and affect. Her behavior is normal. Judgment and thought content normal.  Nursing note and vitals reviewed.   Ortho Exam Right shoulder and knee exams are stable. Specialty Comments:  No specialty comments available.  Imaging: Xr Knee 1-2 Views Right  Result Date: 09/09/2016 Stable healing lateral tibial plateau fracture    PMFS History: Patient Active Problem List   Diagnosis Date Noted  . Closed fracture of lateral portion of right tibial plateau 09/09/2016  . Primary osteoarthritis of right knee 09/09/2016  . Primary osteoarthritis, right shoulder 09/09/2016  . Exertional dyspnea 03/20/2013  . Arthritis 07/25/2012  . Tinnitus 07/25/2012  . Stress incontinence in female 07/25/2012  . Depression 07/25/2012   Past Medical History:  Diagnosis Date  . Depression   . Obesity     Family History  Problem Relation Age of Onset  . Cancer Brother 50       pancreatic cancer  . Asthma Paternal Grandmother   . Heart disease Mother        Mitral valve disease.  . Dementia Mother   .  Lung disease Father        Idiopathic pulmonary fibrosis  . Heart disease Maternal Grandmother        Died after complications of angioplasty.    Past Surgical History:  Procedure Laterality Date  . LAPAROSCOPIC TUBAL LIGATION  1985   Social History   Occupational History  . Not on file.   Social History Main Topics  . Smoking status: Never Smoker  . Smokeless tobacco: Never Used  . Alcohol use 0.6 oz/week    1 Glasses of wine per week  . Drug use: No  . Sexual activity: Not  Currently

## 2016-09-18 ENCOUNTER — Other Ambulatory Visit: Payer: Self-pay | Admitting: Family Medicine

## 2016-09-18 DIAGNOSIS — F988 Other specified behavioral and emotional disorders with onset usually occurring in childhood and adolescence: Secondary | ICD-10-CM

## 2016-09-21 MED ORDER — AMPHETAMINE-DEXTROAMPHETAMINE 10 MG PO TABS: 10.0000 mg | ORAL_TABLET | Freq: Two times a day (BID) | ORAL | 0 refills | Status: DC

## 2016-09-23 ENCOUNTER — Other Ambulatory Visit (INDEPENDENT_AMBULATORY_CARE_PROVIDER_SITE_OTHER): Payer: Medicare Other

## 2016-09-23 DIAGNOSIS — Z23 Encounter for immunization: Secondary | ICD-10-CM

## 2016-09-24 ENCOUNTER — Telehealth: Payer: Self-pay | Admitting: Family Medicine

## 2016-09-24 NOTE — Telephone Encounter (Signed)
Pt aware scripts at front desk. Megan Nunez/RLB

## 2016-09-24 NOTE — Telephone Encounter (Signed)
Pt called for refills of adderall 10 mg #60. Please call pt [phone] ready.

## 2016-09-24 NOTE — Telephone Encounter (Signed)
My notes say that this has been taking care of.

## 2016-10-07 DIAGNOSIS — M25561 Pain in right knee: Secondary | ICD-10-CM | POA: Diagnosis not present

## 2016-10-07 DIAGNOSIS — R262 Difficulty in walking, not elsewhere classified: Secondary | ICD-10-CM | POA: Diagnosis not present

## 2016-10-07 DIAGNOSIS — M6281 Muscle weakness (generalized): Secondary | ICD-10-CM | POA: Diagnosis not present

## 2016-10-12 DIAGNOSIS — R262 Difficulty in walking, not elsewhere classified: Secondary | ICD-10-CM | POA: Diagnosis not present

## 2016-10-12 DIAGNOSIS — M6281 Muscle weakness (generalized): Secondary | ICD-10-CM | POA: Diagnosis not present

## 2016-10-12 DIAGNOSIS — M25561 Pain in right knee: Secondary | ICD-10-CM | POA: Diagnosis not present

## 2016-10-15 DIAGNOSIS — M25561 Pain in right knee: Secondary | ICD-10-CM | POA: Diagnosis not present

## 2016-10-15 DIAGNOSIS — R262 Difficulty in walking, not elsewhere classified: Secondary | ICD-10-CM | POA: Diagnosis not present

## 2016-10-15 DIAGNOSIS — M6281 Muscle weakness (generalized): Secondary | ICD-10-CM | POA: Diagnosis not present

## 2016-10-18 DIAGNOSIS — M6281 Muscle weakness (generalized): Secondary | ICD-10-CM | POA: Diagnosis not present

## 2016-10-18 DIAGNOSIS — M25561 Pain in right knee: Secondary | ICD-10-CM | POA: Diagnosis not present

## 2016-10-18 DIAGNOSIS — R262 Difficulty in walking, not elsewhere classified: Secondary | ICD-10-CM | POA: Diagnosis not present

## 2016-10-20 DIAGNOSIS — R262 Difficulty in walking, not elsewhere classified: Secondary | ICD-10-CM | POA: Diagnosis not present

## 2016-10-20 DIAGNOSIS — M6281 Muscle weakness (generalized): Secondary | ICD-10-CM | POA: Diagnosis not present

## 2016-10-20 DIAGNOSIS — M25561 Pain in right knee: Secondary | ICD-10-CM | POA: Diagnosis not present

## 2016-10-27 DIAGNOSIS — M6281 Muscle weakness (generalized): Secondary | ICD-10-CM | POA: Diagnosis not present

## 2016-10-27 DIAGNOSIS — R262 Difficulty in walking, not elsewhere classified: Secondary | ICD-10-CM | POA: Diagnosis not present

## 2016-10-27 DIAGNOSIS — M25561 Pain in right knee: Secondary | ICD-10-CM | POA: Diagnosis not present

## 2016-12-08 ENCOUNTER — Ambulatory Visit (INDEPENDENT_AMBULATORY_CARE_PROVIDER_SITE_OTHER): Payer: Medicare Other | Admitting: Family Medicine

## 2016-12-08 ENCOUNTER — Encounter: Payer: Self-pay | Admitting: Family Medicine

## 2016-12-08 VITALS — BP 122/60 | HR 72 | Resp 18 | Ht 60.25 in | Wt 162.4 lb

## 2016-12-08 DIAGNOSIS — M1990 Unspecified osteoarthritis, unspecified site: Secondary | ICD-10-CM

## 2016-12-08 DIAGNOSIS — M79604 Pain in right leg: Secondary | ICD-10-CM

## 2016-12-08 DIAGNOSIS — M79605 Pain in left leg: Secondary | ICD-10-CM

## 2016-12-08 DIAGNOSIS — H6122 Impacted cerumen, left ear: Secondary | ICD-10-CM

## 2016-12-08 DIAGNOSIS — F988 Other specified behavioral and emotional disorders with onset usually occurring in childhood and adolescence: Secondary | ICD-10-CM | POA: Diagnosis not present

## 2016-12-08 DIAGNOSIS — F329 Major depressive disorder, single episode, unspecified: Secondary | ICD-10-CM | POA: Diagnosis not present

## 2016-12-08 DIAGNOSIS — F32A Depression, unspecified: Secondary | ICD-10-CM

## 2016-12-08 DIAGNOSIS — M1711 Unilateral primary osteoarthritis, right knee: Secondary | ICD-10-CM | POA: Diagnosis not present

## 2016-12-08 LAB — CBC WITH DIFFERENTIAL/PLATELET
BASOS PCT: 0.8 %
Basophils Absolute: 57 cells/uL (ref 0–200)
EOS ABS: 149 {cells}/uL (ref 15–500)
Eosinophils Relative: 2.1 %
HCT: 42.1 % (ref 35.0–45.0)
HEMOGLOBIN: 14.6 g/dL (ref 11.7–15.5)
Lymphs Abs: 2130 cells/uL (ref 850–3900)
MCH: 30 pg (ref 27.0–33.0)
MCHC: 34.7 g/dL (ref 32.0–36.0)
MCV: 86.6 fL (ref 80.0–100.0)
MPV: 12.1 fL (ref 7.5–12.5)
Monocytes Relative: 8.3 %
NEUTROS ABS: 4175 {cells}/uL (ref 1500–7800)
Neutrophils Relative %: 58.8 %
PLATELETS: 237 10*3/uL (ref 140–400)
RBC: 4.86 10*6/uL (ref 3.80–5.10)
RDW: 12.5 % (ref 11.0–15.0)
TOTAL LYMPHOCYTE: 30 %
WBC mixed population: 589 cells/uL (ref 200–950)
WBC: 7.1 10*3/uL (ref 3.8–10.8)

## 2016-12-08 LAB — COMPREHENSIVE METABOLIC PANEL
AG RATIO: 1.7 (calc) (ref 1.0–2.5)
ALBUMIN MSPROF: 4.3 g/dL (ref 3.6–5.1)
ALT: 27 U/L (ref 6–29)
AST: 27 U/L (ref 10–35)
Alkaline phosphatase (APISO): 80 U/L (ref 33–130)
BUN: 12 mg/dL (ref 7–25)
CHLORIDE: 104 mmol/L (ref 98–110)
CO2: 26 mmol/L (ref 20–32)
CREATININE: 0.63 mg/dL (ref 0.50–0.99)
Calcium: 9.2 mg/dL (ref 8.6–10.4)
GLOBULIN: 2.6 g/dL (ref 1.9–3.7)
Glucose, Bld: 95 mg/dL (ref 65–99)
POTASSIUM: 4.6 mmol/L (ref 3.5–5.3)
Sodium: 138 mmol/L (ref 135–146)
Total Bilirubin: 0.6 mg/dL (ref 0.2–1.2)
Total Protein: 6.9 g/dL (ref 6.1–8.1)

## 2016-12-08 NOTE — Progress Notes (Signed)
Megan Nunez is a 71 y.o. female who presents for annual wellness visit and follow-up on chronic medical conditions.  She has the following concerns: legs ache, let ear, right shoulder.  She does have an underlying history of arthritis.  She states that her legs ache all the time and cannot relate this to any physical activities.  This has limited her exercise.  She continues in counseling and is scheduled to see a psychiatrist for possible medication adjustment.  She does continue on her ADD meds and does get benefits with increased focus.  She is retired.  Immunizations and Health Maintenance Immunization History  Administered Date(s) Administered  . Influenza Split 01/27/2011  . Influenza, High Dose Seasonal PF 11/14/2013, 11/05/2014, 12/25/2015, 09/23/2016  . Influenza,inj,Quad PF,6+ Mos 03/20/2013  . Pneumococcal Conjugate-13 07/12/2013  . Pneumococcal Polysaccharide-23 11/05/2014  . Tdap 08/27/2008  . Zoster 12/30/2008   There are no preventive care reminders to display for this patient.  Last Pap smear: yrs ago Last mammogram: due this year.  Last colonoscopy: 2009 Last DEXA: April 2018 Dentist: Marcelino DusterMichelle Motinger Ophtho: Drema HalonBill Richardson [place] Exercise: none  Other doctors caring for patient include: none- making referral with psychiatrist office.   Advanced directives:  Yes- asked to bring copy     Depression screen:  See questionnaire below.  [facility] 12/08/2016 12/25/2015 11/05/2014 07/12/2013  Decreased Interest 0 0 0 0  Down, Depressed, Hopeless 0 0 0 1  PHQ - 2 Score 0 0 0 1    Fall Risk Screen: see questionnaire below. Fall Risk  12/08/2016 12/25/2015 11/05/2014 07/12/2013  Falls in the past year? No No No No    ADL screen:  See questionnaire below Functional Status Survey: Is the patient deaf or have difficulty hearing?: Yes Does the patient have difficulty seeing, even when wearing glasses/contacts?: No Does the patient have difficulty  concentrating, remembering, or making decisions?: No Does the patient have difficulty walking or climbing stairs?: Yes Does the patient have difficulty dressing or bathing?: No Does the patient have difficulty doing errands alone such as visiting a doctor's office or shopping?: No   Review of Systems Negative except as above   PHYSICAL EXAM:  General Appearance: Alert, cooperative, no distress, appears stated age Head: Normocephalic, without obvious abnormality, atraumatic Eyes: PERRL, conjunctiva/corneas clear, EOM's intact, fundi benign Ears: Normal TM's and external ear canals after cerumen was removed from the left ear Nose: Nares normal, mucosa normal, no drainage or sinus tenderness Throat: Lips, mucosa, and tongue normal; teeth and gums normal Neck: Supple, no lymphadenopathy;  thyroid:  no enlargement/tenderness/nodules; no carotid bruit or JVD Lungs: Clear to auscultation bilaterally without wheezes, rales or ronchi; respirations unlabored Heart: Regular rate and rhythm, S1 and S2 normal, no murmur, rubor gallop Abdomen: Soft, non-tender, nondistended, normoactive bowel sounds,  no masses, no hepatosplenomegaly Extremities: No clubbing, cyanosis or edema Pulses: 2+ and symmetric all extremities Skin:  Skin color, texture, turgor normal, no rashes or lesions Lymph nodes: Cervical, supraclavicular, and axillary nodes normal Neurologic:  CNII-XII intact, normal strength, sensation and gait; reflexes 2+ and symmetric throughout Psych: Normal mood, affect, hygiene and grooming.  ASSESSMENT/PLAN: Impacted cerumen of left ear  Pain in both lower extremities - Plan: CBC with Differential/Platelet, Comprehensive metabolic panel  Depression, unspecified depression type  Attention deficit disorder, unspecified hyperactivity presence  Arthritis  Primary osteoarthritis of right knee - Plan: CBC with Differential/Platelet, Comprehensive metabolic panel Encouraged her to stay  physically active stating that would probably help  with her overall aches and pains that she is having.  She will also follow-up with psychiatry.  I mentioned the fact that the psychiatrist might also get involved with her Adderall.   Medicare Attestation I have personally reviewed: The patient's medical and social history Their use of alcohol, tobacco or illicit drugs Their current medications and supplements The patient's functional ability including ADLs,fall risks, home safety risks, cognitive, and hearing and visual impairment Diet and physical activities Evidence for depression or mood disorders  The patient's weight, height, and BMI have been recorded in the chart.  I have made referrals, counseling, and provided education to the patient based on review of the above and I have provided the patient with a written personalized care plan for preventive services.     Sharlot GowdaJohn Lalonde, MD   12/08/2016

## 2016-12-13 DIAGNOSIS — M25561 Pain in right knee: Secondary | ICD-10-CM | POA: Diagnosis not present

## 2016-12-13 DIAGNOSIS — M6281 Muscle weakness (generalized): Secondary | ICD-10-CM | POA: Diagnosis not present

## 2016-12-13 DIAGNOSIS — R262 Difficulty in walking, not elsewhere classified: Secondary | ICD-10-CM | POA: Diagnosis not present

## 2016-12-16 DIAGNOSIS — M25561 Pain in right knee: Secondary | ICD-10-CM | POA: Diagnosis not present

## 2016-12-16 DIAGNOSIS — M6281 Muscle weakness (generalized): Secondary | ICD-10-CM | POA: Diagnosis not present

## 2016-12-16 DIAGNOSIS — R262 Difficulty in walking, not elsewhere classified: Secondary | ICD-10-CM | POA: Diagnosis not present

## 2016-12-21 DIAGNOSIS — R262 Difficulty in walking, not elsewhere classified: Secondary | ICD-10-CM | POA: Diagnosis not present

## 2016-12-21 DIAGNOSIS — M6281 Muscle weakness (generalized): Secondary | ICD-10-CM | POA: Diagnosis not present

## 2016-12-21 DIAGNOSIS — M25561 Pain in right knee: Secondary | ICD-10-CM | POA: Diagnosis not present

## 2016-12-22 ENCOUNTER — Telehealth: Payer: Self-pay | Admitting: Family Medicine

## 2016-12-22 DIAGNOSIS — F329 Major depressive disorder, single episode, unspecified: Secondary | ICD-10-CM

## 2016-12-22 DIAGNOSIS — F32A Depression, unspecified: Secondary | ICD-10-CM

## 2016-12-22 MED ORDER — FLUOXETINE HCL 40 MG PO CAPS: 40.0000 mg | ORAL_CAPSULE | Freq: Every day | ORAL | 3 refills | Status: DC

## 2016-12-22 NOTE — Telephone Encounter (Signed)
Rcvd note & refill request from pharmacy stating that pt would like to make sure that she receives a 90 day refill on Fluoxetine 40mg  this time if approved

## 2016-12-23 ENCOUNTER — Other Ambulatory Visit: Payer: Self-pay

## 2016-12-24 DIAGNOSIS — R262 Difficulty in walking, not elsewhere classified: Secondary | ICD-10-CM | POA: Diagnosis not present

## 2016-12-24 DIAGNOSIS — M6281 Muscle weakness (generalized): Secondary | ICD-10-CM | POA: Diagnosis not present

## 2016-12-24 DIAGNOSIS — M25561 Pain in right knee: Secondary | ICD-10-CM | POA: Diagnosis not present

## 2017-01-04 DIAGNOSIS — M6281 Muscle weakness (generalized): Secondary | ICD-10-CM | POA: Diagnosis not present

## 2017-01-04 DIAGNOSIS — M25561 Pain in right knee: Secondary | ICD-10-CM | POA: Diagnosis not present

## 2017-01-04 DIAGNOSIS — R262 Difficulty in walking, not elsewhere classified: Secondary | ICD-10-CM | POA: Diagnosis not present

## 2017-01-07 DIAGNOSIS — M6281 Muscle weakness (generalized): Secondary | ICD-10-CM | POA: Diagnosis not present

## 2017-01-07 DIAGNOSIS — R262 Difficulty in walking, not elsewhere classified: Secondary | ICD-10-CM | POA: Diagnosis not present

## 2017-01-07 DIAGNOSIS — M25561 Pain in right knee: Secondary | ICD-10-CM | POA: Diagnosis not present

## 2017-01-10 DIAGNOSIS — M25561 Pain in right knee: Secondary | ICD-10-CM | POA: Diagnosis not present

## 2017-01-10 DIAGNOSIS — M6281 Muscle weakness (generalized): Secondary | ICD-10-CM | POA: Diagnosis not present

## 2017-01-10 DIAGNOSIS — R262 Difficulty in walking, not elsewhere classified: Secondary | ICD-10-CM | POA: Diagnosis not present

## 2017-01-14 ENCOUNTER — Other Ambulatory Visit: Payer: Self-pay | Admitting: Family Medicine

## 2017-01-14 DIAGNOSIS — R262 Difficulty in walking, not elsewhere classified: Secondary | ICD-10-CM | POA: Diagnosis not present

## 2017-01-14 DIAGNOSIS — M6281 Muscle weakness (generalized): Secondary | ICD-10-CM | POA: Diagnosis not present

## 2017-01-14 DIAGNOSIS — F329 Major depressive disorder, single episode, unspecified: Secondary | ICD-10-CM

## 2017-01-14 DIAGNOSIS — M25561 Pain in right knee: Secondary | ICD-10-CM | POA: Diagnosis not present

## 2017-01-14 DIAGNOSIS — F32A Depression, unspecified: Secondary | ICD-10-CM

## 2017-01-17 DIAGNOSIS — M6281 Muscle weakness (generalized): Secondary | ICD-10-CM | POA: Diagnosis not present

## 2017-01-17 DIAGNOSIS — R262 Difficulty in walking, not elsewhere classified: Secondary | ICD-10-CM | POA: Diagnosis not present

## 2017-01-17 DIAGNOSIS — M25561 Pain in right knee: Secondary | ICD-10-CM | POA: Diagnosis not present

## 2017-01-18 DIAGNOSIS — M7071 Other bursitis of hip, right hip: Secondary | ICD-10-CM | POA: Insufficient documentation

## 2017-01-20 DIAGNOSIS — M25561 Pain in right knee: Secondary | ICD-10-CM | POA: Diagnosis not present

## 2017-01-20 DIAGNOSIS — R262 Difficulty in walking, not elsewhere classified: Secondary | ICD-10-CM | POA: Diagnosis not present

## 2017-01-20 DIAGNOSIS — M6281 Muscle weakness (generalized): Secondary | ICD-10-CM | POA: Diagnosis not present

## 2017-01-25 DIAGNOSIS — R262 Difficulty in walking, not elsewhere classified: Secondary | ICD-10-CM | POA: Diagnosis not present

## 2017-01-25 DIAGNOSIS — M25561 Pain in right knee: Secondary | ICD-10-CM | POA: Diagnosis not present

## 2017-01-25 DIAGNOSIS — M6281 Muscle weakness (generalized): Secondary | ICD-10-CM | POA: Diagnosis not present

## 2017-01-27 DIAGNOSIS — F902 Attention-deficit hyperactivity disorder, combined type: Secondary | ICD-10-CM | POA: Diagnosis not present

## 2017-01-27 DIAGNOSIS — F331 Major depressive disorder, recurrent, moderate: Secondary | ICD-10-CM | POA: Diagnosis not present

## 2017-02-24 DIAGNOSIS — F331 Major depressive disorder, recurrent, moderate: Secondary | ICD-10-CM | POA: Diagnosis not present

## 2017-04-03 ENCOUNTER — Telehealth: Payer: Self-pay | Admitting: Family Medicine

## 2017-04-03 NOTE — Telephone Encounter (Signed)
P.A. METHYLPHENIDATE  

## 2017-04-09 NOTE — Telephone Encounter (Signed)
P.A. Approved til 04/03/18, pt informed

## 2017-05-25 DIAGNOSIS — F331 Major depressive disorder, recurrent, moderate: Secondary | ICD-10-CM | POA: Diagnosis not present

## 2017-08-17 DIAGNOSIS — F331 Major depressive disorder, recurrent, moderate: Secondary | ICD-10-CM | POA: Diagnosis not present

## 2017-10-31 ENCOUNTER — Encounter: Payer: Self-pay | Admitting: Emergency Medicine

## 2017-10-31 DIAGNOSIS — F900 Attention-deficit hyperactivity disorder, predominantly inattentive type: Secondary | ICD-10-CM | POA: Insufficient documentation

## 2017-10-31 DIAGNOSIS — F330 Major depressive disorder, recurrent, mild: Secondary | ICD-10-CM | POA: Insufficient documentation

## 2017-11-09 ENCOUNTER — Ambulatory Visit (INDEPENDENT_AMBULATORY_CARE_PROVIDER_SITE_OTHER): Payer: Medicare Other | Admitting: Psychiatry

## 2017-11-09 ENCOUNTER — Encounter: Payer: Self-pay | Admitting: Psychiatry

## 2017-11-09 VITALS — BP 119/76 | HR 70

## 2017-11-09 DIAGNOSIS — F339 Major depressive disorder, recurrent, unspecified: Secondary | ICD-10-CM | POA: Diagnosis not present

## 2017-11-09 DIAGNOSIS — F900 Attention-deficit hyperactivity disorder, predominantly inattentive type: Secondary | ICD-10-CM | POA: Diagnosis not present

## 2017-11-09 DIAGNOSIS — F32A Depression, unspecified: Secondary | ICD-10-CM

## 2017-11-09 DIAGNOSIS — F329 Major depressive disorder, single episode, unspecified: Secondary | ICD-10-CM

## 2017-11-09 MED ORDER — BUPROPION HCL ER (XL) 300 MG PO TB24: 300.0000 mg | ORAL_TABLET | Freq: Every day | ORAL | 0 refills | Status: DC

## 2017-11-09 MED ORDER — AMPHETAMINE-DEXTROAMPHET ER 20 MG PO CP24: 20.0000 mg | ORAL_CAPSULE | Freq: Every day | ORAL | 0 refills | Status: DC

## 2017-11-09 MED ORDER — FLUOXETINE HCL 20 MG PO TABS: 60.0000 mg | ORAL_TABLET | Freq: Every day | ORAL | 0 refills | Status: DC

## 2017-11-09 NOTE — Progress Notes (Signed)
Megan Nunez [id number] Aug 31, 1945 72 y.o.  Subjective:   Patient ID:  Megan Nunez is a 72 y.o. (DOB [birth year]) female.  Chief Complaint:  Chief Complaint  Patient presents with  . Depression  . Anxiety  . ADD    HPI Megan Nunez presents to the office today for follow-up of Depression and Attention Deficit. "I'm kind of up and down but don't think any medication would make a difference." She reports that she takes second dose of Adderall some days and other days will forget or will take first dose too late to take a second dose. She reports that she rarely goes to bed before midnight and attributes this to husband's altered sleep cycle. She reports adequate sleep. Reports that her mood is sometimes impacted by husband's negative moods. Reports that she experiences some guilt about helping care for her husband and balancing her own wants and needs. Reports that when she was younger, depression would be "more persistent and pervasive" with periods of euthymia She reports that she has mild to moderate depression- "It doesn't keep me from seeing the happiness, but the negativism and pessimism won't go away." She reports that her husband thinks she is irritable, however she denies this. Denies anhedonia. Reports that energy and motivation have been persistently low. She reports losing about 15 lbs in the last year and attributes this to trying to eat healthier.  Reports having generalized anxiety which is consistent with lifelong baseline. Reports frequent anxiety that "I can't do anything right." Reports some difficulty with concentration. Denies SI.  Reports that a few days after last visit she was able to make some progress with organization and cleaning "and then I stalled and have stayed stalled ever since."    Medications: I have reviewed the patient's current medications.  Current Outpatient Medications  Medication Sig Dispense Refill  . Biotin (BIOTIN 5000) 5 MG CAPS Take 2 capsules by  mouth daily.     Marland Kitchen buPROPion (WELLBUTRIN XL) 300 MG 24 hr tablet Take 1 tablet (300 mg total) by mouth daily. 90 tablet 0  . FLUoxetine (PROZAC) 20 MG tablet Take 3 tablets (60 mg total) by mouth daily. 270 tablet 0  . Glucosamine-Chondroitin 750-600 MG CHEW Chew 2 tablets by mouth daily.    . Multiple Vitamins-Minerals (MULTIVITAMIN WITH MINERALS) tablet Take 1 tablet by mouth daily.    . naproxen (NAPROSYN) 375 MG tablet Take 375 mg by mouth 2 (two) times daily with a meal.    . Turmeric Curcumin 500 MG CAPS Take 1 capsule by mouth daily.     . vitamin B-12 (CYANOCOBALAMIN) 1000 MCG tablet Take 1,000 mcg by mouth daily.    Marland Kitchen acetaminophen (TYLENOL) 325 MG tablet Take 650 mg by mouth every 6 (six) hours as needed for mild pain.    Melene Muller ON 01/04/2018] amphetamine-dextroamphetamine (ADDERALL XR) 20 MG 24 hr capsule Take 1 capsule (20 mg total) by mouth daily. 30 capsule 0  . [START ON 12/07/2017] amphetamine-dextroamphetamine (ADDERALL XR) 20 MG 24 hr capsule Take 1 capsule (20 mg total) by mouth daily. 30 capsule 0  . amphetamine-dextroamphetamine (ADDERALL XR) 20 MG 24 hr capsule Take 1 capsule (20 mg total) by mouth daily. 30 capsule 0  . Fish Oil-Cholecalciferol (FISH OIL + D3 PO) Take 1 capsule by mouth daily.      No current facility-administered medications for this visit.     Medication Side Effects: None  Allergies: No Known Allergies  Past Medical History:  Diagnosis Date  . Arthritis   . Depression   . Obesity     Family History  Problem Relation Age of Onset  . Cancer Brother 50       pancreatic cancer  . Alcohol abuse Brother   . Depression Brother   . Asthma Paternal Grandmother   . Heart disease Mother        Mitral valve disease.  . Dementia Mother   . Lung disease Father        Idiopathic pulmonary fibrosis  . Heart disease Maternal Grandmother        Died after complications of angioplasty.  . Depression Sister   . Alcohol abuse Sister   . Alcohol  abuse Brother   . Depression Brother   . Alcohol abuse Brother   . Depression Cousin   . Depression Other     Social History   Socioeconomic History  . Marital status: Married    Spouse name: Not on file  . Number of children: Not on file  . Years of education: Not on file  . Highest education level: Not on file  Occupational History  . Not on file  Social Needs  . Financial resource strain: Not on file  . Food insecurity:    Worry: Not on file    Inability: Not on file  . Transportation needs:    Medical: Not on file    Non-medical: Not on file  Tobacco Use  . Smoking status: Never Smoker  . Smokeless tobacco: Never Used  Substance and Sexual Activity  . Alcohol use: Yes    Alcohol/week: 1.0 standard drinks    Types: 1 Glasses of wine per week  . Drug use: No  . Sexual activity: Not Currently  Lifestyle  . Physical activity:    Days per week: Not on file    Minutes per session: Not on file  . Stress: Not on file  Relationships  . Social connections:    Talks on phone: Not on file    Gets together: Not on file    Attends religious service: Not on file    Active member of club or organization: Not on file    Attends meetings of clubs or organizations: Not on file    Relationship status: Not on file  . Intimate partner violence:    Fear of current or ex partner: Not on file    Emotionally abused: Not on file    Physically abused: Not on file    Forced sexual activity: Not on file  Other Topics Concern  . Not on file  Social History Narrative   She is married to another patient of [facility]. They have been married for 20  years.  No children. 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   He never smoked, and drinks maybe 1-3 glass of wine a day.   He has recently started exercising with her husband who has completed cardiac rehabilitation and is in the maintenance program. They usually walks about 2-3 days a week for 30-45  minutes.    Past Medical History, Surgical history, Social history, and Family history were reviewed and updated as appropriate.   Please see review of systems for further details on the patient's review from today.   Review of Systems:  Review of Systems  Respiratory: Negative.   Gastrointestinal: Negative.   Musculoskeletal: Positive for arthralgias.    Objective:   Physical Exam:  BP 119/76   Pulse 70   Physical Exam  Constitutional: She is oriented to person, place, and time. She appears well-developed. No distress.  Musculoskeletal: She exhibits no deformity.  Neurological: She is alert and oriented to person, place, and time. Coordination normal.  Psychiatric: Her speech is normal and behavior is normal. Judgment and thought content normal. Her mood appears anxious. Her affect is not angry, not blunt, not labile and not inappropriate. Cognition and memory are impaired. She exhibits a depressed mood. She expresses no homicidal and no suicidal ideation. She expresses no suicidal plans and no homicidal plans. She exhibits normal recent memory and normal remote memory.  No AH, VH, or delusions. Insight intact.     Lab Review:     Component Value Date/Time   NA 138 12/08/2016 1030   K 4.6 12/08/2016 1030   CL 104 12/08/2016 1030   CO2 26 12/08/2016 1030   GLUCOSE 95 12/08/2016 1030   BUN 12 12/08/2016 1030   CREATININE 0.63 12/08/2016 1030   CALCIUM 9.2 12/08/2016 1030   PROT 6.9 12/08/2016 1030   ALBUMIN 4.2 12/25/2015 1405   AST 27 12/08/2016 1030   ALT 27 12/08/2016 1030   ALKPHOS 68 12/25/2015 1405   BILITOT 0.6 12/08/2016 1030       Component Value Date/Time   WBC 7.1 12/08/2016 1030   RBC 4.86 12/08/2016 1030   HGB 14.6 12/08/2016 1030   HCT 42.1 12/08/2016 1030   PLT 237 12/08/2016 1030   MCV 86.6 12/08/2016 1030   MCH 30.0 12/08/2016 1030   MCHC 34.7 12/08/2016 1030   RDW 12.5 12/08/2016 1030   LYMPHSABS 2,130 12/08/2016 1030   MONOABS 546  12/25/2015 1405   EOSABS 149 12/08/2016 1030   BASOSABS 57 12/08/2016 1030    No results found for: POCLITH, LITHIUM   No results found for: PHENYTOIN, PHENOBARB, VALPROATE, CBMZ   .res Assessment: Plan:   Pt seen for 30 minutes and greater than 50% of session spent counseling pt re: tx options to include switching Adderall to Adderall XR since she reports that she often is either unable to take second dose of Adderall or forgets to take it. Also counseled pt re: Prozac poop out and it is possible that Prozac has lost efficacy since she reports taking it for several years. Discussed that changing Prozac to a different antidepressant may also be beneficial for her mood and anxiety s/s. Pt reports that she would prefer to start with changing Adderall to Adderall XR and then to re-evaluate change in antidepressant at next visit. Encouraged pt to f/u sooner if depressive s/s worsen. Will continue Prozac 60 mg po qd for depression and anxiety. Will continue Wellbutrin XL 300 mg po q am for depression. Pt to f/u in 3 months or sooner if clinically indicated.    Major depression, recurrent, chronic (HCC) - Plan: buPROPion (WELLBUTRIN XL) 300 MG 24 hr tablet, FLUoxetine (PROZAC) 20 MG tablet  Attention deficit hyperactivity disorder (ADHD), predominantly inattentive type - Plan: amphetamine-dextroamphetamine (ADDERALL XR) 20 MG 24 hr capsule, amphetamine-dextroamphetamine (ADDERALL XR) 20 MG 24 hr capsule, amphetamine-dextroamphetamine (ADDERALL XR) 20 MG 24 hr capsule  Depression, unspecified depression type  Please see After Visit Summary for patient specific instructions.  Future Appointments  Date Time Provider Department Center  02/09/2018 10:30 AM Corie Chiquito, PMHNP [facility] None    No orders of the defined types were placed in this encounter.     -------------------------------

## 2017-11-10 ENCOUNTER — Encounter: Payer: Self-pay | Admitting: Family Medicine

## 2017-11-16 ENCOUNTER — Telehealth: Payer: Self-pay

## 2017-11-16 DIAGNOSIS — F329 Major depressive disorder, single episode, unspecified: Secondary | ICD-10-CM

## 2017-11-16 DIAGNOSIS — M1990 Unspecified osteoarthritis, unspecified site: Secondary | ICD-10-CM

## 2017-11-16 DIAGNOSIS — F32A Depression, unspecified: Secondary | ICD-10-CM

## 2017-11-16 DIAGNOSIS — Z13220 Encounter for screening for lipoid disorders: Secondary | ICD-10-CM

## 2017-11-16 NOTE — Telephone Encounter (Signed)
done

## 2017-11-16 NOTE — Telephone Encounter (Signed)
Please put in labs for pt She has awv 01-03-18 and wants labs done 12-01-17 . Thanks [employer]

## 2017-11-25 ENCOUNTER — Encounter: Payer: Self-pay | Admitting: Family Medicine

## 2017-12-01 ENCOUNTER — Other Ambulatory Visit: Payer: Medicare Other

## 2017-12-01 DIAGNOSIS — Z13220 Encounter for screening for lipoid disorders: Secondary | ICD-10-CM

## 2017-12-01 DIAGNOSIS — M1990 Unspecified osteoarthritis, unspecified site: Secondary | ICD-10-CM

## 2017-12-01 DIAGNOSIS — F32A Depression, unspecified: Secondary | ICD-10-CM

## 2017-12-01 DIAGNOSIS — F329 Major depressive disorder, single episode, unspecified: Secondary | ICD-10-CM

## 2017-12-01 LAB — COMPREHENSIVE METABOLIC PANEL
ALBUMIN: 4.3 g/dL (ref 3.6–4.8)
ALK PHOS: 103 IU/L (ref 39–117)
ALT: 15 IU/L (ref 0–32)
AST: 21 IU/L (ref 0–40)
Albumin/Globulin Ratio: 1.9 (ref 1.2–2.2)
BUN/Creatinine Ratio: 24 (ref 12–28)
BUN: 16 mg/dL (ref 8–27)
Bilirubin Total: 0.2 mg/dL (ref 0.0–1.2)
CO2: 23 mmol/L (ref 20–29)
Calcium: 9.2 mg/dL (ref 8.7–10.3)
Chloride: 103 mmol/L (ref 96–106)
Creatinine, Ser: 0.66 mg/dL (ref 0.57–1.00)
GFR calc Af Amer: 104 mL/min/{1.73_m2} (ref >59)
GFR calc non Af Amer: 90 mL/min/{1.73_m2} (ref >59)
GLOBULIN, TOTAL: 2.3 g/dL (ref 1.5–4.5)
Glucose: 93 mg/dL (ref 65–99)
POTASSIUM: 5.1 mmol/L (ref 3.5–5.2)
Sodium: 140 mmol/L (ref 134–144)
Total Protein: 6.6 g/dL (ref 6.0–8.5)

## 2017-12-01 LAB — LIPID PANEL
CHOLESTEROL TOTAL: 136 mg/dL (ref 100–199)
Chol/HDL Ratio: 2.3 ratio (ref 0.0–4.4)
HDL: 58 mg/dL (ref >39)
LDL Calculated: 67 mg/dL (ref 0–99)
Triglycerides: 53 mg/dL (ref 0–149)
VLDL CHOLESTEROL CAL: 11 mg/dL (ref 5–40)

## 2017-12-01 LAB — CBC WITH DIFFERENTIAL/PLATELET
BASOS: 1 %
Basophils Absolute: 0.1 10*3/uL (ref 0.0–0.2)
EOS (ABSOLUTE): 0.2 10*3/uL (ref 0.0–0.4)
Eos: 2 %
Hematocrit: 42.5 % (ref 34.0–46.6)
Hemoglobin: 14 g/dL (ref 11.1–15.9)
IMMATURE GRANULOCYTES: 0 %
Immature Grans (Abs): 0 10*3/uL (ref 0.0–0.1)
LYMPHS ABS: 2 10*3/uL (ref 0.7–3.1)
Lymphs: 24 %
MCH: 29.4 pg (ref 26.6–33.0)
MCHC: 32.9 g/dL (ref 31.5–35.7)
MCV: 89 fL (ref 79–97)
MONOS ABS: 0.6 10*3/uL (ref 0.1–0.9)
Monocytes: 8 %
Neutrophils Absolute: 5.4 10*3/uL (ref 1.4–7.0)
Neutrophils: 65 %
PLATELETS: 212 10*3/uL (ref 150–450)
RBC: 4.76 x10E6/uL (ref 3.77–5.28)
RDW: 12.5 % (ref 12.3–15.4)
WBC: 8.3 10*3/uL (ref 3.4–10.8)

## 2017-12-05 ENCOUNTER — Other Ambulatory Visit: Payer: Self-pay

## 2018-01-03 ENCOUNTER — Ambulatory Visit (INDEPENDENT_AMBULATORY_CARE_PROVIDER_SITE_OTHER): Payer: Medicare Other | Admitting: Family Medicine

## 2018-01-03 ENCOUNTER — Encounter: Payer: Self-pay | Admitting: Family Medicine

## 2018-01-03 VITALS — BP 120/78 | HR 76 | Temp 98.4°F | Ht 60.0 in | Wt 151.2 lb

## 2018-01-03 DIAGNOSIS — M19011 Primary osteoarthritis, right shoulder: Secondary | ICD-10-CM

## 2018-01-03 DIAGNOSIS — Z23 Encounter for immunization: Secondary | ICD-10-CM | POA: Diagnosis not present

## 2018-01-03 DIAGNOSIS — F330 Major depressive disorder, recurrent, mild: Secondary | ICD-10-CM | POA: Diagnosis not present

## 2018-01-03 DIAGNOSIS — M1990 Unspecified osteoarthritis, unspecified site: Secondary | ICD-10-CM

## 2018-01-03 DIAGNOSIS — M7061 Trochanteric bursitis, right hip: Secondary | ICD-10-CM

## 2018-01-03 DIAGNOSIS — Z1211 Encounter for screening for malignant neoplasm of colon: Secondary | ICD-10-CM

## 2018-01-03 DIAGNOSIS — M7501 Adhesive capsulitis of right shoulder: Secondary | ICD-10-CM

## 2018-01-03 DIAGNOSIS — F900 Attention-deficit hyperactivity disorder, predominantly inattentive type: Secondary | ICD-10-CM

## 2018-01-03 MED ORDER — AMPHETAMINE-DEXTROAMPHET ER 20 MG PO CP24: 20.0000 mg | ORAL_CAPSULE | Freq: Every day | ORAL | 0 refills | Status: DC

## 2018-01-03 NOTE — Progress Notes (Addendum)
Megan Nunez is a 72 y.o. female who presents for annual wellness visit and follow-up on chronic medical conditions.  She has the following concerns: She is concerned over her ADD medication possibly having an effect on her sleep but is not sure.  She cannot really tell when the medication kicks in or when it wears off.  Sleep pattern is very irregular.  She continues on Wellbutrin and Prozac.  She is being followed by another therapist for this. She also complains of difficulty with right shoulder pain with decreased range of motion.  She has had trouble with this in the past and apparently did get an injection.  She also complains of right hip pain especially when she is up walking for an extended period of time.  Immunizations and Health Maintenance Immunization History  Administered Date(s) Administered  . Influenza Split 01/27/2011  . Influenza, High Dose Seasonal PF 11/14/2013, 11/05/2014, 12/25/2015, 09/23/2016, 01/03/2018  . Influenza,inj,Quad PF,6+ Mos 03/20/2013  . Pneumococcal Conjugate-13 07/12/2013  . Pneumococcal Polysaccharide-23 11/05/2014  . Tdap 08/27/2008  . Zoster 12/30/2008   Health Maintenance Due  Topic Date Due  . COLONOSCOPY  01/18/2017  . INFLUENZA VACCINE  08/18/2017  . MAMMOGRAM  12/04/2017    Last Pap smear: over five years Last mammogram:12-05-15 Last colonoscopy: 01-19-2007 Last DEXA:05-06-16 Dentist:Q six months Ophtho:06-2017 Exercise: walking   Other doctors caring for patient include: Dr. Senaida Oresichardson eye doctor, counselor Bennie DallasHarald Petrini MD, [address], Dr. Lorrine KinMootinger Dentist    Advanced directives: Yes.  Copy asked for. Does Patient Have a Medical Advance Directive?: Yes  Depression screen:  See questionnaire below.  Depression screen [facility] 2/9 01/03/2018 11/16/2017 12/08/2016 12/25/2015 11/05/2014  Decreased Interest 2 0 0 0 0  Down, Depressed, Hopeless 1 1 0 0 0  PHQ - 2 Score 3 1 0 0 0  Altered sleeping 1 - - - -  Tired, decreased energy 1 - - - -   Change in appetite 0 - - - -  Feeling bad or failure about yourself  3 - - - -  Trouble concentrating 3 - - - -  Moving slowly or fidgety/restless 2 - - - -  Suicidal thoughts 0 - - - -  PHQ-9 Score 13 - - - -  Difficult doing work/chores Somewhat difficult - - - -    Fall Risk Screen: see questionnaire below. Fall Risk  01/03/2018 11/16/2017 12/23/2016 12/08/2016 12/25/2015  Falls in the past year? 0 No No No No  Comment - - Emmi Telephone Survey: data to providers prior to load - -    ADL screen:  See questionnaire below Functional Status Survey: Is the patient deaf or have difficulty hearing?: No Does the patient have difficulty seeing, even when wearing glasses/contacts?: No Does the patient have difficulty concentrating, remembering, or making decisions?: Yes Does the patient have difficulty walking or climbing stairs?: Yes(joint pain) Does the patient have difficulty dressing or bathing?: No Does the patient have difficulty doing errands alone such as visiting a doctor's office or shopping?: No   Review of Systems Constitutional: -, -unexpected weight change, -anorexia, -fatigue Allergy: -sneezing, -itching, -congestion Dermatology: denies changing moles, rash, lumps ENT: -runny nose, -ear pain, -sore throat,  Cardiology:  -chest pain, -palpitations, -orthopnea, Respiratory: -cough, -shortness of breath, -dyspnea on exertion, -wheezing,  Gastroenterology: -abdominal pain, -nausea, -vomiting, -diarrhea, -constipation, -dysphagia Hematology: -bleeding or bruising problems Musculoskeletal: -arthralgias, -myalgias, -joint swelling, -back pain, - Ophthalmology: -vision changes,  Urology: -dysuria, -difficulty urinating,  -urinary frequency, -urgency,  incontinence Neurology: -, -numbness, , -memory loss, -falls, -dizziness    PHYSICAL EXAM:  BP 120/78 (BP Location: Left Arm, Patient Position: Sitting)   Pulse 76   Temp 98.4 F (36.9 C)   Ht 5' (1.524 m)   Wt 151 lb 3.2  oz (68.6 kg)   SpO2 94%   BMI 29.53 kg/m   General Appearance: Alert, cooperative, no distress, appears stated age Head: Normocephalic, without obvious abnormality, atraumatic Eyes: PERRL, conjunctiva/corneas clear, EOM's intact, fundi benign Ears: Normal TM's and external ear canals Nose: Nares normal, mucosa normal, no drainage or sinus tenderness Throat: Lips, mucosa, and tongue normal; teeth and gums normal Neck: Supple, no lymphadenopathy;  thyroid:  no enlargement/tenderness/nodules; no carotid bruit or JVD Lungs: Clear to auscultation bilaterally without wheezes, rales or ronchi; respirations unlabored Heart: Regular rate and rhythm, S1 and S2 normal, no murmur, rubor gallop Abdomen: Soft, non-tender, nondistended, normoactive bowel sounds,  no masses, no hepatosplenomegaly Extremities: No clubbing, cyanosis or edema.  Exam of the right shoulder does show decreased range of motion in all planes.  She is also slightly tender to palpation over the greater trochanter.  Pulses: 2+ and symmetric all extremities Skin:  Skin color, texture, turgor normal, no rashes or lesions Lymph nodes: Cervical, supraclavicular, and axillary nodes normal Neurologic:  CNII-XII intact, normal strength, sensation and gait; reflexes 2+ and symmetric throughout Psych: Normal mood, affect, hygiene and grooming.  ASSESSMENT/PLAN: Need for influenza vaccination - Plan: Flu vaccine HIGH DOSE PF (Fluzone High dose)  Screening for colon cancer - Plan: Cologuard  ADHD, predominantly inattentive type  Arthritis  Major depressive disorder, recurrent episode, mild (HCC)  Primary osteoarthritis, right shoulder  Adhesive capsulitis of right shoulder  Trochanteric bursitis of right hip Discussed proper care of her shoulder recommending returning to the orthopedic surgeon for possible injection of the shoulder and the hip. I will renew her ADD medication. Also discussed possibly tapering off the psychotropic  medications this spring.  She will discuss this with her therapist. She will also set up to get a mammogram.  Also recommend getting Shingrix and Tdap. She will keep me informed as to how she is doing with her sleep patterns.   Discussed  yearly mammograms; at least 30 minutes of aerobic activity at least 5 days/week and weight-bearing exercise 2x/week;  healthy diet, including goals of calcium and vitamin D intake reviewed; regular seatbelt use; changing batteries in smoke detectors.  Immunization recommendations discussed.  Colonoscopy recommendations reviewed   Medicare Attestation I have personally reviewed: The patient's medical and social history Their use of alcohol, tobacco or illicit drugs Their current medications and supplements The patient's functional ability including ADLs,fall risks, home safety risks, cognitive, and hearing and visual impairment Diet and physical activities Evidence for depression or mood disorders  The patient's weight, height, and BMI have been recorded in the chart.  I have made referrals, counseling, and provided education to the patient based on review of the above and I have provided the patient with a written personalized care plan for preventive services.     Sharlot Gowda, MD   01/03/2018

## 2018-01-03 NOTE — Patient Instructions (Signed)
  Ms. Aileen PilotBishop , Thank you for taking time to come for your Medicare Wellness Visit. I appreciate your ongoing commitment to your health goals. Please review the following plan we discussed and let me know if I can assist you in the future.   These are the goals we discussed: Goals   None     This is a list of the screening recommended for you and due dates:  Health Maintenance  Topic Date Due  . Colon Cancer Screening  01/18/2017  . Flu Shot  08/18/2017  . Mammogram  12/04/2017  . Tetanus Vaccine  08/28/2018  . DEXA scan (bone density measurement)  Completed  .  Hepatitis C: One time screening is recommended by Center for Disease Control  (CDC) for  adults born from [birth year] through 1963.   Completed  . Pneumonia vaccines  Completed

## 2018-01-10 ENCOUNTER — Ambulatory Visit (INDEPENDENT_AMBULATORY_CARE_PROVIDER_SITE_OTHER): Payer: Medicare Other | Admitting: Family Medicine

## 2018-01-10 ENCOUNTER — Encounter: Payer: Self-pay | Admitting: Family Medicine

## 2018-01-10 VITALS — BP 124/72 | HR 77 | Temp 97.9°F | Resp 16 | Wt 150.4 lb

## 2018-01-10 DIAGNOSIS — H8110 Benign paroxysmal vertigo, unspecified ear: Secondary | ICD-10-CM

## 2018-01-10 DIAGNOSIS — R42 Dizziness and giddiness: Secondary | ICD-10-CM

## 2018-01-10 MED ORDER — MECLIZINE HCL 25 MG PO TABS: 25.0000 mg | ORAL_TABLET | Freq: Two times a day (BID) | ORAL | 0 refills | Status: DC | PRN

## 2018-01-10 NOTE — Patient Instructions (Signed)
You appear to have a benign condition called vertigo.  Try the meclizine 1/2 tablet to 1 whole tablet but be aware this medication may be sedating.  Stay well-hydrated.  Change positions slowly. If your symptoms are not improving in the next 2 to 3 days or if you have any worsening symptoms then let us know. If your develop any new weakness of face, extremity, difficulty with speech or any other strokelike symptoms then you would need to call 911 or go to the emergency department.  I do not expect anything like this to happen.   Benign Positional Vertigo Vertigo is the feeling that you or your surroundings are moving when they are not. Benign positional vertigo is the most common form of vertigo. This is usually a harmless condition (benign). This condition is positional. This means that symptoms are triggered by certain movements and positions. This condition can be dangerous if it occurs while you are doing something that could cause harm to you or others. This includes activities such as driving or operating machinery. What are the causes? In many cases, the cause of this condition is not known. It may be caused by a disturbance in an area of the inner ear that helps your brain to sense movement and balance. This disturbance can be caused by:  Viral infection (labyrinthitis).  Head injury.  Repetitive motion, such as jumping, dancing, or running. What increases the risk? You are more likely to develop this condition if:  You are a woman.  You are 70 years of age or older. What are the signs or symptoms? Symptoms of this condition usually happen when you move your head or your eyes in different directions. Symptoms may start suddenly, and usually last for less than a minute. They include:  Loss of balance and falling.  Feeling like you are spinning or moving.  Feeling like your surroundings are spinning or moving.  Nausea and vomiting.  Blurred vision.  Dizziness.  Involuntary  eye movement (nystagmus). Symptoms can be mild and cause only minor problems, or they can be severe and interfere with daily life. Episodes of benign positional vertigo may return (recur) over time. Symptoms may improve over time. How is this diagnosed? This condition may be diagnosed based on:  Your medical history.  Physical exam of the head, neck, and ears.  Tests, such as: ? MRI. ? CT scan. ? Eye movement tests. Your health care provider may ask you to change positions quickly while he or she watches you for symptoms of benign positional vertigo, such as nystagmus. Eye movement may be tested with a variety of exams that are designed to evaluate or stimulate vertigo. ? An electroencephalogram (EEG). This records electrical activity in your brain. ? Hearing tests. You may be referred to a health care provider who specializes in ear, nose, and throat (ENT) problems (otolaryngologist) or a provider who specializes in disorders of the nervous system (neurologist). How is this treated?  This condition may be treated in a session in which your health care provider moves your head in specific positions to adjust your inner ear back to normal. Treatment for this condition may take several sessions. Surgery may be needed in severe cases, but this is rare. In some cases, benign positional vertigo may resolve on its own in 2-4 weeks. Follow these instructions at home: Safety  Move slowly. Avoid sudden body or head movements or certain positions, as told by your health care provider.  Avoid driving until your health care  provider says it is safe for you to do so.  Avoid operating heavy machinery until your health care provider says it is safe for you to do so.  Avoid doing any tasks that would be dangerous to you or others if vertigo occurs.  If you have trouble walking or keeping your balance, try using a cane for stability. If you feel dizzy or unstable, sit down right away.  Return to  your normal activities as told by your health care provider. Ask your health care provider what activities are safe for you. General instructions  Take over-the-counter and prescription medicines only as told by your health care provider.  Drink enough fluid to keep your urine pale yellow.  Keep all follow-up visits as told by your health care provider. This is important. Contact a health care provider if:  You have a fever.  Your condition gets worse or you develop new symptoms.  Your family or friends notice any behavioral changes.  You have nausea or vomiting that gets worse.  You have numbness or a "pins and needles" sensation. Get help right away if you:  Have difficulty speaking or moving.  Are always dizzy.  Faint.  Develop severe headaches.  Have weakness in your legs or arms.  Have changes in your hearing or vision.  Develop a stiff neck.  Develop sensitivity to light. Summary  Vertigo is the feeling that you or your surroundings are moving when they are not. Benign positional vertigo is the most common form of vertigo.  The cause of this condition is not known. It may be caused by a disturbance in an area of the inner ear that helps your brain to sense movement and balance.  Symptoms include loss of balance and falling, feeling that you or your surroundings are moving, nausea and vomiting, and blurred vision.  This condition can be diagnosed based on symptoms, physical exam, and other tests, such as MRI, CT scan, eye movement tests, and hearing tests.  Follow safety instructions as told by your health care provider. You will also be told when to contact your health care provider in case of problems. This information is not intended to replace advice given to you by your health care provider. Make sure you discuss any questions you have with your health care provider. Document Released: 10/12/2005 Document Revised: 06/15/2017 Document Reviewed:  06/15/2017 [employer]vier Interactive Patient Education  2019 [employer]

## 2018-01-10 NOTE — Progress Notes (Signed)
   Subjective:    Patient ID: Megan Nunez, female    DOB: 06/25/1945, 72 y.o.   MRN: [id number]  HPI Chief Complaint  Patient presents with  . vertigo    4 days of vertigo- today is better. had vertigo once before.    She is here with complaints of a 4 day history of dizziness upon standing or with other position changes.  States she feels like she just got off a merry-go-round.  It last for several seconds and resolve spontaneously.  Occurs several times throughout the day but only with position changes.  She recalls having this 1 other time in the distant past and it was called vertigo. Reports that she has not been well-hydrated over the past week or so.  Denies fever, chills, vision changes, headache, vision changes, ear pain or fullness, URI symptoms, chest pain, palpitations, shortness of breath, cough, abdominal pain, nausea, vomiting, diarrhea, urinary symptoms, lower extremity edema. No worsening tinnitus.  She does have this from time to time.  Denies any numbness, tingling or weakness. She has not taken anything for her symptoms.  Her husband drove her here today and states they are leaving town for a few days.  Reviewed allergies, medications, past medical, surgical, family, and social history.   Review of Systems Pertinent positives and negatives in the history of present illness.     Objective:   Physical Exam BP 124/72 (BP Location: Right Arm, Patient Position: Standing, Cuff Size: Normal)   Pulse 77   Temp 97.9 F (36.6 C) (Oral)   Resp 16   Wt 150 lb 6.4 oz (68.2 kg)   SpO2 97%   BMI 29.37 kg/m   Alert and oriented and in no distress.  No sinus tenderness.  Tympanic membranes and canals are normal. Pharyngeal area is normal. Neck is supple without adenopathy or thyromegaly. Cardiac exam shows a regular sinus rhythm without murmurs or gallops. Lungs are clear to auscultation.  Extremities without edema, pulses intact.  Skin is warm and dry, no pallor.   Neurological exam shows PERRLA, EOMs intact, nystagmus present, no facial asymmetry or pronator drift.  Normal sensation in motor function in all extremities.  CNs intact.  DTRs symmetric and normal, no clonus.  Negative Romberg.  Normal gait.  Speech, mood, memory and thought process are all normal.      Assessment & Plan:  Benign paroxysmal positional vertigo, unspecified laterality - Plan: meclizine (ANTIVERT) 25 MG tablet  Dizziness  No red flag symptoms. She is not orthostatic. Discussed that she appears to have BPPV.  Discussed management.  Meclizine prescribed.  She will try 1/2 tablet and see how this does.  If not improving she will try the whole tablet.  Discussed potential sedating effects.  Advised that she hydrate and change positions slowly. Strict precautions that if she develops any signs of stroke that she call 911 or go straight to the emergency department.  She feels comfortable with this plan of care.

## 2018-01-17 ENCOUNTER — Encounter: Payer: Self-pay | Admitting: Family Medicine

## 2018-01-19 DIAGNOSIS — Z1231 Encounter for screening mammogram for malignant neoplasm of breast: Secondary | ICD-10-CM | POA: Diagnosis not present

## 2018-01-19 LAB — HM MAMMOGRAPHY

## 2018-01-24 ENCOUNTER — Ambulatory Visit (INDEPENDENT_AMBULATORY_CARE_PROVIDER_SITE_OTHER): Payer: Medicare Other | Admitting: Orthopaedic Surgery

## 2018-01-24 ENCOUNTER — Ambulatory Visit (INDEPENDENT_AMBULATORY_CARE_PROVIDER_SITE_OTHER): Payer: Medicare Other

## 2018-01-24 DIAGNOSIS — M19011 Primary osteoarthritis, right shoulder: Secondary | ICD-10-CM

## 2018-01-24 DIAGNOSIS — M25551 Pain in right hip: Secondary | ICD-10-CM

## 2018-01-24 MED ORDER — BUPIVACAINE HCL 0.5 % IJ SOLN
3.0000 mL | INTRAMUSCULAR | Status: AC | PRN
  Administered 2018-01-24: 3 mL via INTRA_ARTICULAR

## 2018-01-24 MED ORDER — LIDOCAINE HCL 1 % IJ SOLN
3.0000 mL | INTRAMUSCULAR | Status: AC | PRN
  Administered 2018-01-24: 3 mL

## 2018-01-24 MED ORDER — METHYLPREDNISOLONE ACETATE 40 MG/ML IJ SUSP
40.0000 mg | INTRAMUSCULAR | Status: AC | PRN
  Administered 2018-01-24: 40 mg via INTRA_ARTICULAR

## 2018-01-24 NOTE — Progress Notes (Signed)
Office Visit Note   Patient: Megan Nunez           Date of Birth: Oct 06, 1945           MRN: [id number] Visit Date: 01/24/2018              Requested by: Ronnald Nian, MD [address] [place], [place] [address] PCP: Ronnald Nian, MD   Assessment & Plan: Visit Diagnoses:  1. Pain in right hip   2. Primary osteoarthritis of right shoulder     Plan: Impression is advanced right shoulder glenohumeral degenerative joint disease and right hip trochanteric bursitis.  We will arrange for her to receive an intra-articular injection with Dr. Prince Rome this week.  I did perform a trochanteric bursa injection to her right hip today.  Patient tolerates well.  Referral for physical therapy for both the shoulder and the hip was given today.  Follow-up as needed.  Follow-Up Instructions: No follow-ups on file.   Orders:  Orders Placed This Encounter  Procedures  . XR Shoulder Right  . XR HIP UNILAT W OR W/O PELVIS 2-3 VIEWS RIGHT   No orders of the defined types were placed in this encounter.     Procedures: Large Joint Inj: R greater trochanter on 01/24/2018 2:25 PM Indications: pain Details: 22 G needle  Arthrogram: No  Medications: 3 mL lidocaine 1 %; 3 mL bupivacaine 0.5 %; 40 mg methylPREDNISolone acetate 40 MG/ML Patient was prepped and draped in the usual sterile fashion.       Clinical Data: No additional findings.   Subjective: Chief Complaint  Patient presents with  . Right Shoulder - Pain  . Right Hip - Pain    Megan Nunez comes in today for follow-up of her right shoulder and right hip pain.  Her right shoulder has known advanced glenohumeral degenerative joint disease.  She previously had a cortisone injection which did give her significant relief but this was temporary.  She has noticed that she has been modifying her activity with her arm secondary to the pain and crepitus.  She denies any radicular symptoms.  Terms of her hip this hurts on the lateral  aspect.  She denies any groin pain or any radicular symptoms.  She denies any back pain.   Review of Systems  Constitutional: Negative.   HENT: Negative.   Eyes: Negative.   Respiratory: Negative.   Cardiovascular: Negative.   Endocrine: Negative.   Musculoskeletal: Negative.   Neurological: Negative.   Hematological: Negative.   Psychiatric/Behavioral: Negative.   All other systems reviewed and are negative.    Objective: Vital Signs: There were no vitals taken for this visit.  Physical Exam Vitals signs and nursing note reviewed.  Constitutional:      Appearance: She is well-developed.  Pulmonary:     Effort: Pulmonary effort is normal.  Skin:    General: Skin is warm.     Capillary Refill: Capillary refill takes less than 2 seconds.  Neurological:     Mental Status: She is alert and oriented to person, place, and time.  Psychiatric:        Behavior: Behavior normal.        Thought Content: Thought content normal.        Judgment: Judgment normal.     Ortho Exam Right shoulder exam shows catching with passive and active range of motion.  She does have limitation with flexion abduction and external rotation.  Rotator cuff is grossly intact to manual  muscle testing. Right hip exam shows painless rotation of the hip joint.  Trochanteric bursa is tender to palpation. Specialty Comments:  No specialty comments available.  Imaging: Xr Hip Unilat W Or W/o Pelvis 2-3 Views Right  Result Date: 01/24/2018 No acute or structural abnormalities  Xr Shoulder Right  Result Date: 01/24/2018 Advanced glenohumeral degenerative joint disease with large inferior humeral head osteophyte.    PMFS History: Patient Active Problem List   Diagnosis Date Noted  . Major depressive disorder, recurrent episode, mild (HCC) 10/31/2017  . ADHD, predominantly inattentive type 10/31/2017  . Primary osteoarthritis of right knee 09/09/2016  . Primary osteoarthritis, right shoulder 09/09/2016   . Arthritis 07/25/2012  . Tinnitus 07/25/2012  . Stress incontinence in female 07/25/2012   Past Medical History:  Diagnosis Date  . Arthritis   . Depression   . Obesity     Family History  Problem Relation Age of Onset  . Cancer Brother 50       pancreatic cancer  . Alcohol abuse Brother   . Depression Brother   . Asthma Paternal Grandmother   . Heart disease Mother        Mitral valve disease.  . Dementia Mother   . Lung disease Father        Idiopathic pulmonary fibrosis  . Heart disease Maternal Grandmother        Died after complications of angioplasty.  . Depression Sister   . Alcohol abuse Sister   . Alcohol abuse Brother   . Depression Brother   . Alcohol abuse Brother   . Depression Cousin   . Depression Other     Past Surgical History:  Procedure Laterality Date  . LAPAROSCOPIC TUBAL LIGATION  1985   Social History   Occupational History  . Not on file  Tobacco Use  . Smoking status: Never Smoker  . Smokeless tobacco: Never Used  Substance and Sexual Activity  . Alcohol use: Yes    Alcohol/week: 1.0 standard drinks    Types: 1 Glasses of wine per week  . Drug use: No  . Sexual activity: Not Currently

## 2018-01-26 ENCOUNTER — Encounter (INDEPENDENT_AMBULATORY_CARE_PROVIDER_SITE_OTHER): Payer: Self-pay | Admitting: Family Medicine

## 2018-01-26 ENCOUNTER — Ambulatory Visit (INDEPENDENT_AMBULATORY_CARE_PROVIDER_SITE_OTHER): Payer: Medicare Other | Admitting: Family Medicine

## 2018-01-26 DIAGNOSIS — M19011 Primary osteoarthritis, right shoulder: Secondary | ICD-10-CM | POA: Diagnosis not present

## 2018-01-26 MED ORDER — METHYLPREDNISOLONE ACETATE 40 MG/ML IJ SUSP: 40.0000 mg | Freq: Once | INTRAMUSCULAR | Status: DC

## 2018-01-26 NOTE — Progress Notes (Signed)
Subjective: She is here for a planned ultrasound-guided glenohumeral injection for the right shoulder.  Objective: Abduction limited to 85 degrees, behind the back barely to belt level.  She has difficulty reaching behind her head.  Procedure: Ultrasound-guided right glenohumeral injection: After sterile prep with Betadine, injected 8 cc 1% lidocaine without epinephrine and 40 mg methylprednisolone from posterior approach into the posterior joint recess using ultrasound to guide needle placement, injectate was seen filling the joint capsule.  She had very good pain relief during the immediate anesthetic phase.

## 2018-01-30 DIAGNOSIS — M25511 Pain in right shoulder: Secondary | ICD-10-CM | POA: Diagnosis not present

## 2018-01-30 DIAGNOSIS — M6281 Muscle weakness (generalized): Secondary | ICD-10-CM | POA: Diagnosis not present

## 2018-01-30 DIAGNOSIS — M7061 Trochanteric bursitis, right hip: Secondary | ICD-10-CM | POA: Diagnosis not present

## 2018-01-30 DIAGNOSIS — M19011 Primary osteoarthritis, right shoulder: Secondary | ICD-10-CM | POA: Diagnosis not present

## 2018-01-31 DIAGNOSIS — Z1211 Encounter for screening for malignant neoplasm of colon: Secondary | ICD-10-CM | POA: Diagnosis not present

## 2018-02-02 DIAGNOSIS — M6281 Muscle weakness (generalized): Secondary | ICD-10-CM | POA: Diagnosis not present

## 2018-02-02 DIAGNOSIS — M25511 Pain in right shoulder: Secondary | ICD-10-CM | POA: Diagnosis not present

## 2018-02-02 DIAGNOSIS — M7061 Trochanteric bursitis, right hip: Secondary | ICD-10-CM | POA: Diagnosis not present

## 2018-02-02 DIAGNOSIS — M19011 Primary osteoarthritis, right shoulder: Secondary | ICD-10-CM | POA: Diagnosis not present

## 2018-02-04 LAB — COLOGUARD: Cologuard: NEGATIVE

## 2018-02-07 DIAGNOSIS — M19011 Primary osteoarthritis, right shoulder: Secondary | ICD-10-CM | POA: Diagnosis not present

## 2018-02-07 DIAGNOSIS — M7061 Trochanteric bursitis, right hip: Secondary | ICD-10-CM | POA: Diagnosis not present

## 2018-02-07 DIAGNOSIS — M25511 Pain in right shoulder: Secondary | ICD-10-CM | POA: Diagnosis not present

## 2018-02-07 DIAGNOSIS — M6281 Muscle weakness (generalized): Secondary | ICD-10-CM | POA: Diagnosis not present

## 2018-02-09 ENCOUNTER — Ambulatory Visit (INDEPENDENT_AMBULATORY_CARE_PROVIDER_SITE_OTHER): Payer: Medicare Other | Admitting: Psychiatry

## 2018-02-09 ENCOUNTER — Encounter: Payer: Self-pay | Admitting: Psychiatry

## 2018-02-09 VITALS — BP 125/67 | HR 62

## 2018-02-09 DIAGNOSIS — F339 Major depressive disorder, recurrent, unspecified: Secondary | ICD-10-CM | POA: Diagnosis not present

## 2018-02-09 DIAGNOSIS — F900 Attention-deficit hyperactivity disorder, predominantly inattentive type: Secondary | ICD-10-CM

## 2018-02-09 MED ORDER — DULOXETINE HCL 60 MG PO CPEP: 60.0000 mg | ORAL_CAPSULE | Freq: Every morning | ORAL | 0 refills | Status: DC

## 2018-02-09 MED ORDER — DULOXETINE HCL 30 MG PO CPEP: ORAL_CAPSULE | ORAL | 0 refills | Status: DC

## 2018-02-09 NOTE — Progress Notes (Signed)
Megan Nunez [id number] Mar 23, 1945 73 y.o.  Subjective:   Patient ID:  Megan Nunez is a 73 y.o. (DOB Jun 03, 1945) female.  Chief Complaint:  Chief Complaint  Patient presents with  . Follow-up    h/o Depression, Anxiety, and ADD    HPI Megan Nunez presents to the office today for follow-up of anxiety, depression, and ADD. She reports that she has been "stressed." Recounts mutiple stressful events since last 2022-05-28 to include sudden death in the family and another death around the holidays. Reports that they have also had events that she has enjoyed to include travel and conferences.   She reports "I held up ok mostly until the fall." Reports that she was asked to do several things by their minister and she "never followed up on." Reports that minister requested to meet with her since pt had not been replying to her e-mails and offered her additional support with fulfilling responsibilities. Reports that her husband offered support. She reports that there were several days where she had severe mood s/s and felt she was unable to function. Reports that she was not getting an adequate amount of sleep in November and Dec "and could not shut my brain off." reports that she was sleeping 6 hours or less during that time. She reports that she recently has made a concerted effort to take Adderall XR earlier in the day and then taking Adderall IR when unable to take it early. Reports that mood has improved slightly in recent weeks but remains persistently and severely depressed. Energy and motivation have improved slightly but remain low. She reports that she has also been experiencing some anxiety and "not knowing what is going to happen next" and being hesitant to schedule things. She reports concentration is adequate for short duration. Reports difficulty sustaining concentration. Reports feeling overwhelmed by tasks she needs to complete and is afraid to make a lists of tasks to complete. Denies SI.     Reports that Adderall XR is more helpful compared to immediate release.   Past Psychiatric Medication Trials: Sertraline Prozac Wellbutrin XL Adderall Adderall XR   Review of Systems:  Review of Systems  Musculoskeletal: Negative for gait problem.       In PT for shoulder pain  Neurological: Negative for tremors.  Psychiatric/Behavioral:       Please refer to HPI    Medications: I have reviewed the patient's current medications.  Current Outpatient Medications  Medication Sig Dispense Refill  . acetaminophen (TYLENOL) 325 MG tablet Take 650 mg by mouth every 6 (six) hours as needed for mild pain.    Melene Muller ON 03/07/2018] amphetamine-dextroamphetamine (ADDERALL XR) 20 MG 24 hr capsule Take 1 capsule (20 mg total) by mouth daily. 30 capsule 0  . Biotin (BIOTIN 5000) 5 MG CAPS Take 2 capsules by mouth daily.     Marland Kitchen buPROPion (WELLBUTRIN XL) 300 MG 24 hr tablet Take 1 tablet (300 mg total) by mouth daily. 90 tablet 0  . Glucosamine-Chondroitin 750-600 MG CHEW Chew 2 tablets by mouth daily.    . Multiple Vitamins-Minerals (MULTIVITAMIN WITH MINERALS) tablet Take 1 tablet by mouth daily.    . naproxen (NAPROSYN) 375 MG tablet Take 375 mg by mouth 2 (two) times daily with a meal.    . Turmeric Curcumin 500 MG CAPS Take 1 capsule by mouth daily.     . vitamin B-12 (CYANOCOBALAMIN) 1000 MCG tablet Take 1,000 mcg by mouth daily.    . DULoxetine (CYMBALTA) 30  MG capsule Take 1 capsule po q am x 1 week, then 2 capsules po q am 30 capsule 0  . DULoxetine (CYMBALTA) 60 MG capsule Take 1 capsule (60 mg total) by mouth every morning for 30 days. 30 capsule 0  . FLUoxetine (PROZAC) 20 MG tablet Take 3 tablets (60 mg total) by mouth daily. (Patient taking differently: Take 15 mg by mouth daily. ) 270 tablet 0   Current Facility-Administered Medications  Medication Dose Route Frequency Provider Last Rate Last Dose  . methylPREDNISolone acetate (DEPO-MEDROL) injection 40 mg  40 mg  Intra-articular Once Hilts, Michael, MD        Medication Side Effects: Other: Difficulty with sleep initation when taking Addderall XR later.   Allergies: No Known Allergies  Past Medical History:  Diagnosis Date  . Arthritis   . Depression   . Obesity     Family History  Problem Relation Age of Onset  . Cancer Brother 50       pancreatic cancer  . Alcohol abuse Brother   . Depression Brother   . Asthma Paternal Grandmother   . Heart disease Mother        Mitral valve disease.  . Dementia Mother   . Lung disease Father        Idiopathic pulmonary fibrosis  . Heart disease Maternal Grandmother        Died after complications of angioplasty.  . Depression Sister   . Alcohol abuse Sister   . Alcohol abuse Brother   . Depression Brother   . Alcohol abuse Brother   . Depression Cousin   . Depression Other     Social History   Socioeconomic History  . Marital status: Married    Spouse name: Not on file  . Number of children: Not on file  . Years of education: Not on file  . Highest education level: Not on file  Occupational History  . Not on file  Social Needs  . Financial resource strain: Not on file  . Food insecurity:    Worry: Not on file    Inability: Not on file  . Transportation needs:    Medical: Not on file    Non-medical: Not on file  Tobacco Use  . Smoking status: Never Smoker  . Smokeless tobacco: Never Used  Substance and Sexual Activity  . Alcohol use: Yes    Alcohol/week: 1.0 standard drinks    Types: 1 Glasses of wine per week  . Drug use: No  . Sexual activity: Not Currently  Lifestyle  . Physical activity:    Days per week: Not on file    Minutes per session: Not on file  . Stress: Not on file  Relationships  . Social connections:    Talks on phone: Not on file    Gets together: Not on file    Attends religious service: Not on file    Active member of club or organization: Not on file    Attends meetings of clubs or  organizations: Not on file    Relationship status: Not on file  . Intimate partner violence:    Fear of current or ex partner: Not on file    Emotionally abused: Not on file    Physically abused: Not on file    Forced sexual activity: Not on file  Other Topics Concern  . Not on file  Social History Narrative   She is married to another patient of [facility]. They have been married  for 20  years.  No children. 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   He never smoked, and drinks maybe 1-3 glass of wine a day.   He has recently started exercising with her husband who has completed cardiac rehabilitation and is in the maintenance program. They usually walks about 2-3 days a week for 30-45 minutes.    Past Medical History, Surgical history, Social history, and Family history were reviewed and updated as appropriate.   Please see review of systems for further details on the patient's review from today.   Objective:   Physical Exam:  BP 125/67   Pulse 62   Physical Exam Constitutional:      General: She is not in acute distress.    Appearance: She is well-developed.  Musculoskeletal:        General: No deformity.  Neurological:     Mental Status: She is alert and oriented to person, place, and time.     Coordination: Coordination normal.  Psychiatric:        Mood and Affect: Mood is anxious and depressed. Affect is not labile, blunt, angry or inappropriate.        Speech: Speech normal.        Behavior: Behavior normal.        Thought Content: Thought content normal. Thought content does not include homicidal or suicidal ideation. Thought content does not include homicidal or suicidal plan.        Judgment: Judgment normal.     Comments: Insight intact. No auditory or visual hallucinations. No delusions.      Lab Review:     Component Value Date/Time   NA 140 12/01/2017 0908   K 5.1 12/01/2017 0908   CL 103 12/01/2017 0908   CO2  23 12/01/2017 0908   GLUCOSE 93 12/01/2017 0908   GLUCOSE 95 12/08/2016 1030   BUN 16 12/01/2017 0908   CREATININE 0.66 12/01/2017 0908   CREATININE 0.63 12/08/2016 1030   CALCIUM 9.2 12/01/2017 0908   PROT 6.6 12/01/2017 0908   ALBUMIN 4.3 12/01/2017 0908   AST 21 12/01/2017 0908   ALT 15 12/01/2017 0908   ALKPHOS 103 12/01/2017 0908   BILITOT 0.2 12/01/2017 0908   GFRNONAA 90 12/01/2017 0908   GFRAA 104 12/01/2017 0908       Component Value Date/Time   WBC 8.3 12/01/2017 0908   WBC 7.1 12/08/2016 1030   RBC 4.76 12/01/2017 0908   RBC 4.86 12/08/2016 1030   HGB 14.0 12/01/2017 0908   HCT 42.5 12/01/2017 0908   PLT 212 12/01/2017 0908   MCV 89 12/01/2017 0908   MCH 29.4 12/01/2017 0908   MCH 30.0 12/08/2016 1030   MCHC 32.9 12/01/2017 0908   MCHC 34.7 12/08/2016 1030   RDW 12.5 12/01/2017 0908   LYMPHSABS 2.0 12/01/2017 0908   MONOABS 546 12/25/2015 1405   EOSABS 0.2 12/01/2017 0908   BASOSABS 0.1 12/01/2017 0908    No results found for: POCLITH, LITHIUM   No results found for: PHENYTOIN, PHENOBARB, VALPROATE, CBMZ   .res Assessment: Plan:   Discussed several possible treatment options to include cross titration of Prozac to Cymbalta since Prozac no longer seems to be as effective for patients mood and anxiety and patient has experienced about with Prozac and other SSRIs in the past.  Discussed trial of an SNRI to specifically target low energy, low motivation, and depression.  Discussed potential benefits, risk, and side effects  of Cymbalta.  Discussed using a cross titration to minimize risk of discontinuation and side effects. Will decrease Prozac to 40 mg p.o. daily x5 days, then decrease to 20 mg p.o. daily x5 days, then discontinue. Start Cymbalta 30 mg daily in the morning for 1 week and then increase to 60 mg daily for depression and anxiety. Discussed that it may be beneficial to decrease Adderall XR to 15 mg daily since patient is describing possible insomnia  since starting this dose.  Patient reports that she recently had Adderall XR 20 mg filled and would like to consider dose decrease at next visit in 4 weeks.  Major depression, recurrent, chronic (HCC) - Plan: DULoxetine (CYMBALTA) 30 MG capsule, DULoxetine (CYMBALTA) 60 MG capsule  Attention deficit hyperactivity disorder (ADHD), predominantly inattentive type  Please see After Visit Summary for patient specific instructions.  Future Appointments  Date Time Provider Department Center  03/23/2018 10:00 AM Corie Chiquito, PMHNP [facility] None    No orders of the defined types were placed in this encounter.     -------------------------------

## 2018-02-09 NOTE — Patient Instructions (Signed)
-   Decrease Prozac to 40 mg daily for 5 days, then 20 mg daily for 5 days, then stop.  - Start Cymbalta (Duloxetine) 30 mg in the morning for one week, then increase to 60 mg in the morning.

## 2018-02-13 DIAGNOSIS — M7061 Trochanteric bursitis, right hip: Secondary | ICD-10-CM | POA: Diagnosis not present

## 2018-02-13 DIAGNOSIS — M19011 Primary osteoarthritis, right shoulder: Secondary | ICD-10-CM | POA: Diagnosis not present

## 2018-02-13 DIAGNOSIS — M25511 Pain in right shoulder: Secondary | ICD-10-CM | POA: Diagnosis not present

## 2018-02-13 DIAGNOSIS — M6281 Muscle weakness (generalized): Secondary | ICD-10-CM | POA: Diagnosis not present

## 2018-02-23 ENCOUNTER — Other Ambulatory Visit: Payer: Self-pay | Admitting: Psychiatry

## 2018-02-23 DIAGNOSIS — F339 Major depressive disorder, recurrent, unspecified: Secondary | ICD-10-CM

## 2018-03-02 ENCOUNTER — Telehealth: Payer: Self-pay | Admitting: Psychiatry

## 2018-03-02 DIAGNOSIS — F900 Attention-deficit hyperactivity disorder, predominantly inattentive type: Secondary | ICD-10-CM

## 2018-03-02 NOTE — Telephone Encounter (Signed)
Pt wants to report that the transition from Prozac to the new antidepressant is helping. May need to cut back on her stimulant dose though, Can she try Adderall 15mg xr?

## 2018-03-03 MED ORDER — AMPHETAMINE-DEXTROAMPHET ER 15 MG PO CP24: 15.0000 mg | ORAL_CAPSULE | ORAL | 0 refills | Status: DC

## 2018-03-10 ENCOUNTER — Other Ambulatory Visit: Payer: Self-pay | Admitting: Psychiatry

## 2018-03-10 DIAGNOSIS — F339 Major depressive disorder, recurrent, unspecified: Secondary | ICD-10-CM

## 2018-03-20 ENCOUNTER — Other Ambulatory Visit: Payer: Self-pay | Admitting: Psychiatry

## 2018-03-20 DIAGNOSIS — F339 Major depressive disorder, recurrent, unspecified: Secondary | ICD-10-CM

## 2018-03-23 ENCOUNTER — Ambulatory Visit: Payer: Medicare Other | Admitting: Psychiatry

## 2018-04-04 ENCOUNTER — Other Ambulatory Visit: Payer: Self-pay | Admitting: Psychiatry

## 2018-04-04 DIAGNOSIS — F339 Major depressive disorder, recurrent, unspecified: Secondary | ICD-10-CM

## 2018-04-16 ENCOUNTER — Other Ambulatory Visit: Payer: Self-pay | Admitting: Psychiatry

## 2018-04-16 DIAGNOSIS — F339 Major depressive disorder, recurrent, unspecified: Secondary | ICD-10-CM

## 2018-04-21 ENCOUNTER — Telehealth: Payer: Self-pay | Admitting: Psychiatry

## 2018-04-21 DIAGNOSIS — F900 Attention-deficit hyperactivity disorder, predominantly inattentive type: Secondary | ICD-10-CM

## 2018-04-21 MED ORDER — AMPHETAMINE-DEXTROAMPHET ER 15 MG PO CP24: 15.0000 mg | ORAL_CAPSULE | ORAL | 0 refills | Status: DC

## 2018-04-21 NOTE — Telephone Encounter (Signed)
Ptient called and said that she needs a new script of adderrall 15 mg xr sent to the [facility] on [address]

## 2018-04-27 ENCOUNTER — Other Ambulatory Visit: Payer: Self-pay | Admitting: Psychiatry

## 2018-04-27 DIAGNOSIS — F339 Major depressive disorder, recurrent, unspecified: Secondary | ICD-10-CM

## 2018-05-04 ENCOUNTER — Other Ambulatory Visit: Payer: Self-pay | Admitting: Psychiatry

## 2018-05-04 DIAGNOSIS — F339 Major depressive disorder, recurrent, unspecified: Secondary | ICD-10-CM

## 2018-05-05 NOTE — Telephone Encounter (Signed)
Has appt 04/28, right at 30 days since last fill. Okay to send for next fill?

## 2018-05-16 ENCOUNTER — Encounter: Payer: Self-pay | Admitting: Psychiatry

## 2018-05-16 ENCOUNTER — Ambulatory Visit (INDEPENDENT_AMBULATORY_CARE_PROVIDER_SITE_OTHER): Payer: Medicare Other | Admitting: Psychiatry

## 2018-05-16 ENCOUNTER — Other Ambulatory Visit: Payer: Self-pay

## 2018-05-16 DIAGNOSIS — F900 Attention-deficit hyperactivity disorder, predominantly inattentive type: Secondary | ICD-10-CM

## 2018-05-16 DIAGNOSIS — F339 Major depressive disorder, recurrent, unspecified: Secondary | ICD-10-CM | POA: Diagnosis not present

## 2018-05-16 MED ORDER — DULOXETINE HCL 60 MG PO CPEP: 60.0000 mg | ORAL_CAPSULE | Freq: Every morning | ORAL | 1 refills | Status: DC

## 2018-05-16 MED ORDER — AMPHETAMINE-DEXTROAMPHET ER 15 MG PO CP24: 15.0000 mg | ORAL_CAPSULE | ORAL | 0 refills | Status: DC

## 2018-05-16 NOTE — Progress Notes (Signed)
Megan Nunez [id number] 01-Aug-1945 73 y.o.  Virtual Visit via Video Note  I connected with@ on 05/16/18 at  1:00 PM EDT by a video enabled telemedicine application and verified that I am speaking with the correct person using two identifiers.   I discussed the limitations of evaluation and management by telemedicine and the availability of in person appointments. The patient expressed understanding and agreed to proceed.  I discussed the assessment and treatment plan with the patient. The patient was provided an opportunity to ask questions and all were answered. The patient agreed with the plan and demonstrated an understanding of the instructions.   The patient was advised to call back or seek an in-person evaluation if the symptoms worsen or if the condition fails to improve as anticipated.  I provided 30 minutes of non-face-to-face time during this encounter.  The patient was located at home.  The provider was located at home.   Corie Chiquito, PMHNP   Subjective:   Patient ID:  Megan Nunez is a 73 y.o. (DOB November 16, 1945) female.  Chief Complaint: No chief complaint on file.   HPI Megan Nunez presents to the office today for follow-up of attention deficit, depression, and anxiety.  She reports, "I'm mostly fine." She reports that she and her husband have been growing in their relationship. She reports that she and her husband have been staying home due to the pandemic. She reports that they have hired someone to [employer] their meals and this is working well for them. "It's taken off so much pressure." Reports that she has also hired someone to help with some errands.  She reports that switch from Prozac to Cymbalta was "terrific" and that she has experienced some improvement in mood and anxiety. She reports that she has not had significant anxiety. Denies having any depression that she could not identify a trigger for. Notices that she will occasionally sleep excessively as avoidance when  dealing with severe stressor. She reports that her concentration seems to have improved with Cymbalta.   Reports that she has not been taking Adderall on the days that she wakes up later. She reports that she recently finished a big project and "it was the first project I have completed in years." She reports improved energy and motivation. She reports that she has not been as motivated towards exercise as she would like. Reports that she has not been calling people as often as she would like. She reports adequate sleep most of the time and has had only a few incidents where she has had difficulty falling asleep.   Denies SI.   Reports that husband has been seeing [facility] and this has been helpful. She reports that at times "I'm not good about always tuning in" with herself emotionally.   Past Psychiatric Medication Trials: Sertraline Prozac Wellbutrin XL Adderall Adderall XR  Review of Systems:  Review of Systems  Cardiovascular: Negative for palpitations.  Musculoskeletal: Negative for gait problem.  Neurological: Negative for tremors.       Reports that she no longer notices a tremor  Psychiatric/Behavioral:       Please refer to HPI    Medications: I have reviewed the patient's current medications.  Current Outpatient Medications  Medication Sig Dispense Refill  . acetaminophen (TYLENOL) 325 MG tablet Take 650 mg by mouth every 6 (six) hours as needed for mild pain.    Melene Muller ON 07/15/2018] amphetamine-dextroamphetamine (ADDERALL XR) 15 MG 24 hr capsule Take 1 capsule  by mouth every morning for 30 days. 30 capsule 0  . [START ON 06/17/2018] amphetamine-dextroamphetamine (ADDERALL XR) 15 MG 24 hr capsule Take 1 capsule by mouth every morning. 30 capsule 0  . [START ON 05/20/2018] amphetamine-dextroamphetamine (ADDERALL XR) 15 MG 24 hr capsule Take 1 capsule by mouth every morning for 30 days. 30 capsule 0  . Biotin (BIOTIN 5000) 5 MG CAPS Take 2 capsules by mouth daily.      Marland Kitchen. buPROPion (WELLBUTRIN XL) 300 MG 24 hr tablet TAKE ONE TABLET BY MOUTH DAILY 90 tablet 0  . DULoxetine (CYMBALTA) 30 MG capsule Take 1 capsule po q am x 1 week, then 2 capsules po q am 30 capsule 0  . DULoxetine (CYMBALTA) 60 MG capsule Take 1 capsule (60 mg total) by mouth every morning. 90 capsule 1  . FLUoxetine (PROZAC) 20 MG tablet Take 3 tablets (60 mg total) by mouth daily. (Patient taking differently: Take 15 mg by mouth daily. ) 270 tablet 0  . Glucosamine-Chondroitin 750-600 MG CHEW Chew 2 tablets by mouth daily.    . Multiple Vitamins-Minerals (MULTIVITAMIN WITH MINERALS) tablet Take 1 tablet by mouth daily.    . naproxen (NAPROSYN) 375 MG tablet Take 375 mg by mouth 2 (two) times daily with a meal.    . Turmeric Curcumin 500 MG CAPS Take 1 capsule by mouth daily.     . vitamin B-12 (CYANOCOBALAMIN) 1000 MCG tablet Take 1,000 mcg by mouth daily.     Current Facility-Administered Medications  Medication Dose Route Frequency Provider Last Rate Last Dose  . methylPREDNISolone acetate (DEPO-MEDROL) injection 40 mg  40 mg Intra-articular Once Hilts, Michael, MD        Medication Side Effects: None  Allergies: No Known Allergies  Past Medical History:  Diagnosis Date  . Arthritis   . Depression   . Obesity     Family History  Problem Relation Age of Onset  . Cancer Brother 50       pancreatic cancer  . Alcohol abuse Brother   . Depression Brother   . Asthma Paternal Grandmother   . Heart disease Mother        Mitral valve disease.  . Dementia Mother   . Lung disease Father        Idiopathic pulmonary fibrosis  . Heart disease Maternal Grandmother        Died after complications of angioplasty.  . Depression Sister   . Alcohol abuse Sister   . Alcohol abuse Brother   . Depression Brother   . Alcohol abuse Brother   . Depression Cousin   . Depression Other     Social History   Socioeconomic History  . Marital status: Married    Spouse name: Not on file   . Number of children: Not on file  . Years of education: Not on file  . Highest education level: Not on file  Occupational History  . Not on file  Social Needs  . Financial resource strain: Not on file  . Food insecurity:    Worry: Not on file    Inability: Not on file  . Transportation needs:    Medical: Not on file    Non-medical: Not on file  Tobacco Use  . Smoking status: Never Smoker  . Smokeless tobacco: Never Used  Substance and Sexual Activity  . Alcohol use: Yes    Alcohol/week: 1.0 standard drinks    Types: 1 Glasses of wine per week  . Drug use: No  .  Sexual activity: Not Currently  Lifestyle  . Physical activity:    Days per week: Not on file    Minutes per session: Not on file  . Stress: Not on file  Relationships  . Social connections:    Talks on phone: Not on file    Gets together: Not on file    Attends religious service: Not on file    Active member of club or organization: Not on file    Attends meetings of clubs or organizations: Not on file    Relationship status: Not on file  . Intimate partner violence:    Fear of current or ex partner: Not on file    Emotionally abused: Not on file    Physically abused: Not on file    Forced sexual activity: Not on file  Other Topics Concern  . Not on file  Social History Narrative   She is married to another patient of [facility]. They have been married for 20  years.  No children. 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   He never smoked, and drinks maybe 1-3 glass of wine a day.   He has recently started exercising with her husband who has completed cardiac rehabilitation and is in the maintenance program. They usually walks about 2-3 days a week for 30-45 minutes.    Past Medical History, Surgical history, Social history, and Family history were reviewed and updated as appropriate.   Please see review of systems for further details on the patient's review from  today.   Objective:   Physical Exam:  There were no vitals taken for this visit.  Physical Exam Neurological:     Mental Status: She is alert and oriented to person, place, and time.     Cranial Nerves: No dysarthria.  Psychiatric:        Attention and Perception: Attention normal.        Mood and Affect: Mood normal.        Speech: Speech normal.        Behavior: Behavior is cooperative.        Thought Content: Thought content normal. Thought content is not paranoid or delusional. Thought content does not include homicidal or suicidal ideation. Thought content does not include homicidal or suicidal plan.        Cognition and Memory: Cognition and memory normal.        Judgment: Judgment normal.     Lab Review:     Component Value Date/Time   NA 140 12/01/2017 0908   K 5.1 12/01/2017 0908   CL 103 12/01/2017 0908   CO2 23 12/01/2017 0908   GLUCOSE 93 12/01/2017 0908   GLUCOSE 95 12/08/2016 1030   BUN 16 12/01/2017 0908   CREATININE 0.66 12/01/2017 0908   CREATININE 0.63 12/08/2016 1030   CALCIUM 9.2 12/01/2017 0908   PROT 6.6 12/01/2017 0908   ALBUMIN 4.3 12/01/2017 0908   AST 21 12/01/2017 0908   ALT 15 12/01/2017 0908   ALKPHOS 103 12/01/2017 0908   BILITOT 0.2 12/01/2017 0908   GFRNONAA 90 12/01/2017 0908   GFRAA 104 12/01/2017 0908       Component Value Date/Time   WBC 8.3 12/01/2017 0908   WBC 7.1 12/08/2016 1030   RBC 4.76 12/01/2017 0908   RBC 4.86 12/08/2016 1030   HGB 14.0 12/01/2017 0908   HCT 42.5 12/01/2017 0908   PLT 212 12/01/2017 0908   MCV 89  12/01/2017 0908   MCH 29.4 12/01/2017 0908   MCH 30.0 12/08/2016 1030   MCHC 32.9 12/01/2017 0908   MCHC 34.7 12/08/2016 1030   RDW 12.5 12/01/2017 0908   LYMPHSABS 2.0 12/01/2017 0908   MONOABS 546 12/25/2015 1405   EOSABS 0.2 12/01/2017 0908   BASOSABS 0.1 12/01/2017 0908    No results found for: POCLITH, LITHIUM   No results found for: PHENYTOIN, PHENOBARB, VALPROATE, CBMZ    .res Assessment: Plan:   Continue Cymbalta 60 mg daily since this has been effective for patients mood and anxiety signs and symptoms. Continue Adderall XR 15 mg p.o. every morning for attention deficit. Continue Wellbutrin XL 300 mg daily for depression.  Patient to follow-up in 3 months or sooner if clinically indicated.  Attention deficit hyperactivity disorder (ADHD), predominantly inattentive type - Plan: amphetamine-dextroamphetamine (ADDERALL XR) 15 MG 24 hr capsule, amphetamine-dextroamphetamine (ADDERALL XR) 15 MG 24 hr capsule, amphetamine-dextroamphetamine (ADDERALL XR) 15 MG 24 hr capsule  Major depression, recurrent, chronic (HCC) - Plan: DULoxetine (CYMBALTA) 60 MG capsule  Please see After Visit Summary for patient specific instructions.  No future appointments.  No orders of the defined types were placed in this encounter.     -------------------------------

## 2018-07-12 ENCOUNTER — Other Ambulatory Visit: Payer: Self-pay | Admitting: Physician Assistant

## 2018-07-12 DIAGNOSIS — F339 Major depressive disorder, recurrent, unspecified: Secondary | ICD-10-CM

## 2018-09-24 ENCOUNTER — Other Ambulatory Visit: Payer: Self-pay | Admitting: Physician Assistant

## 2018-09-24 DIAGNOSIS — F339 Major depressive disorder, recurrent, unspecified: Secondary | ICD-10-CM

## 2018-09-27 ENCOUNTER — Telehealth: Payer: Self-pay | Admitting: Psychiatry

## 2018-09-27 ENCOUNTER — Other Ambulatory Visit: Payer: Self-pay

## 2018-09-27 DIAGNOSIS — F900 Attention-deficit hyperactivity disorder, predominantly inattentive type: Secondary | ICD-10-CM

## 2018-09-27 NOTE — Telephone Encounter (Signed)
Megan Nunez called to request refill of her adderall. Next appt 10/10/18.  Send to [facility] on [address]

## 2018-09-28 MED ORDER — AMPHETAMINE-DEXTROAMPHET ER 15 MG PO CP24: 15.0000 mg | ORAL_CAPSULE | ORAL | 0 refills | Status: DC

## 2018-09-28 NOTE — Telephone Encounter (Signed)
Refill submitted to pharmacy.

## 2018-10-10 ENCOUNTER — Ambulatory Visit (INDEPENDENT_AMBULATORY_CARE_PROVIDER_SITE_OTHER): Payer: Medicare Other | Admitting: Psychiatry

## 2018-10-10 ENCOUNTER — Encounter: Payer: Self-pay | Admitting: Psychiatry

## 2018-10-10 ENCOUNTER — Other Ambulatory Visit: Payer: Self-pay

## 2018-10-10 DIAGNOSIS — F339 Major depressive disorder, recurrent, unspecified: Secondary | ICD-10-CM | POA: Diagnosis not present

## 2018-10-10 DIAGNOSIS — F900 Attention-deficit hyperactivity disorder, predominantly inattentive type: Secondary | ICD-10-CM

## 2018-10-10 MED ORDER — AMPHETAMINE-DEXTROAMPHET ER 15 MG PO CP24: 15.0000 mg | ORAL_CAPSULE | ORAL | 0 refills | Status: DC

## 2018-10-10 MED ORDER — DULOXETINE HCL 30 MG PO CPEP: 30.0000 mg | ORAL_CAPSULE | Freq: Every day | ORAL | 0 refills | Status: DC

## 2018-10-10 MED ORDER — DULOXETINE HCL 60 MG PO CPEP: 60.0000 mg | ORAL_CAPSULE | Freq: Every morning | ORAL | 1 refills | Status: DC

## 2018-10-10 NOTE — Progress Notes (Signed)
Megan Nunez [id number] [birth year] 73 y.o.  Virtual Visit via Video Note  I connected with pt @ on 10/10/18 at 11:15 AM EDT by a video enabled telemedicine application and verified that I am speaking with the correct person using two identifiers.   I discussed the limitations of evaluation and management by telemedicine and the availability of in person appointments. The patient expressed understanding and agreed to proceed.  I discussed the assessment and treatment plan with the patient. The patient was provided an opportunity to ask questions and all were answered. The patient agreed with the plan and demonstrated an understanding of the instructions.   The patient was advised to call back or seek an in-person evaluation if the symptoms worsen or if the condition fails to improve as anticipated.  I provided 30 minutes of non-face-to-face time during this encounter.  The patient was located at home.  The provider was located at [place].   Thayer Headings, PMHNP   Subjective:   Patient ID:  Megan Nunez is a 73 y.o. (DOB 04/26/1945) female.  Chief Complaint:  Chief Complaint  Patient presents with  . Depression  . Anxiety  . ADD    HPI Megan Nunez presents for follow-up of depression, anxiety, and ADD. She reports that things are "pretty much the same as ever." She reports "I don't feel in charge of my time and my life. The new realization is I have always been this way." She now recognizes that this has been has been a lifelong pattern. She reports that she often puts her husband's needs first. She describes sometimes feeling overwhelmed with larger tasks and breaking into smaller tasks. She reports that her concentration is "not bad." She reports that she had had some occ days where she has forgotten to take medications. She reports that she has noticed once she has starts a new task she is more likely to complete it. She reports that she is "so much more relaxed" since  paying for someone to prepare meals since she would previously have difficulty planning and preparing for meals. She reports that she has some worry about politics. She describe anxiety is "normal for me." She thinks that anxiety may be better and that she has been "less sharp" with husband when she is feeling anxious. Reports that depression has been "not more than usual... I think the low level depression is a permanent state." She attributes some mild depression to world events and politics. She reports that her energy is slightly low. She reports motivation is episodic. She reports that she continues to not call people that she would like to speak with and knows it would be appreciated. She reports that she has been sleeping well typically. Occ periods of sleeping about 6 hours a night for 3 consecutive nights. She reports that her appetite has been "too good." She reports that she has been "comfort eating" at times. Denies SI.   She reports that she and her husband have been staying at home during the pandemic. She reports that she and her husband "may be taking excessive precautions."  Reports that they have lost some of the elders in her church.    Continues to see Nathaneil Canary, LPC.   Past Psychiatric Medication Trials: Sertraline Prozac Cymbalta Wellbutrin XL Adderall Adderall XR  Review of Systems:  Review of Systems  Cardiovascular: Negative for palpitations.  Musculoskeletal: Negative for gait problem.       Knee pain  Neurological: Negative for tremors.  Psychiatric/Behavioral:       Please refer to HPI    Medications: I have reviewed the patient's current medications.  Current Outpatient Medications  Medication Sig Dispense Refill  . acetaminophen (TYLENOL) 325 MG tablet Take 650 mg by mouth every 6 (six) hours as needed for mild pain.    Marland Kitchen amphetamine-dextroamphetamine (ADDERALL XR) 15 MG 24 hr capsule Take 1 capsule by mouth every morning. 30 capsule 0  . Biotin  (BIOTIN 5000) 5 MG CAPS Take 2 capsules by mouth daily.     Marland Kitchen buPROPion (WELLBUTRIN XL) 300 MG 24 hr tablet TAKE ONE TABLET BY MOUTH DAILY 90 tablet 0  . Glucosamine-Chondroitin 750-600 MG CHEW Chew 2 tablets by mouth daily.    . Multiple Vitamins-Minerals (MULTIVITAMIN WITH MINERALS) tablet Take 1 tablet by mouth daily.    . naproxen (NAPROSYN) 375 MG tablet Take 375 mg by mouth 2 (two) times daily with a meal.    . Turmeric Curcumin 500 MG CAPS Take 1 capsule by mouth daily.     . vitamin B-12 (CYANOCOBALAMIN) 1000 MCG tablet Take 1,000 mcg by mouth daily.    Marland Kitchen amphetamine-dextroamphetamine (ADDERALL XR) 15 MG 24 hr capsule Take 1 capsule by mouth every morning for 30 days. 30 capsule 0  . [START ON 11/25/2018] amphetamine-dextroamphetamine (ADDERALL XR) 15 MG 24 hr capsule Take 1 capsule by mouth every morning. 30 capsule 0  . DULoxetine (CYMBALTA) 30 MG capsule Take 1 capsule (30 mg total) by mouth daily. Take with one 60 mg capsule to equal total dose of 90 mg. 90 capsule 0  . DULoxetine (CYMBALTA) 60 MG capsule Take 1 capsule (60 mg total) by mouth every morning. 90 capsule 1   Current Facility-Administered Medications  Medication Dose Route Frequency Provider Last Rate Last Dose  . methylPREDNISolone acetate (DEPO-MEDROL) injection 40 mg  40 mg Intra-articular Once Hilts, Michael, MD        Medication Side Effects: None  Allergies: No Known Allergies  Past Medical History:  Diagnosis Date  . Arthritis   . Depression   . Obesity     Family History  Problem Relation Age of Onset  . Cancer Brother 50       pancreatic cancer  . Alcohol abuse Brother   . Depression Brother   . Asthma Paternal Grandmother   . Heart disease Mother        Mitral valve disease.  . Dementia Mother   . Lung disease Father        Idiopathic pulmonary fibrosis  . Heart disease Maternal Grandmother        Died after complications of angioplasty.  . Depression Sister   . Alcohol abuse Sister   .  Alcohol abuse Brother   . Depression Brother   . Alcohol abuse Brother   . Depression Cousin   . Depression Other     Social History   Socioeconomic History  . Marital status: Married    Spouse name: Not on file  . Number of children: Not on file  . Years of education: Not on file  . Highest education level: Not on file  Occupational History  . Not on file  Social Needs  . Financial resource strain: Not on file  . Food insecurity    Worry: Not on file    Inability: Not on file  . Transportation needs    Medical: Not on file    Non-medical: Not on file  Tobacco Use  . Smoking status: Never  Smoker  . Smokeless tobacco: Never Used  Substance and Sexual Activity  . Alcohol use: Yes    Alcohol/week: 1.0 standard drinks    Types: 1 Glasses of wine per week  . Drug use: No  . Sexual activity: Not Currently  Lifestyle  . Physical activity    Days per week: Not on file    Minutes per session: Not on file  . Stress: Not on file  Relationships  . Social Musician on phone: Not on file    Gets together: Not on file    Attends religious service: Not on file    Active member of club or organization: Not on file    Attends meetings of clubs or organizations: Not on file    Relationship status: Not on file  . Intimate partner violence    Fear of current or ex partner: Not on file    Emotionally abused: Not on file    Physically abused: Not on file    Forced sexual activity: Not on file  Other Topics Concern  . Not on file  Social History Narrative   She is married to another patient of [facility]. They have been married for 20  years.  No children. 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   He never smoked, and drinks maybe 1-3 glass of wine a day.   He has recently started exercising with her husband who has completed cardiac rehabilitation and is in the maintenance program. They usually walks about 2-3 days a week for  30-45 minutes.    Past Medical History, Surgical history, Social history, and Family history were reviewed and updated as appropriate.   Please see review of systems for further details on the patient's review from today.   Objective:   Physical Exam:  There were no vitals taken for this visit.  Physical Exam Neurological:     Mental Status: She is alert and oriented to person, place, and time.     Cranial Nerves: No dysarthria.  Psychiatric:        Attention and Perception: Attention normal.        Mood and Affect: Mood is depressed.        Speech: Speech normal.        Behavior: Behavior is cooperative.        Thought Content: Thought content normal. Thought content is not paranoid or delusional. Thought content does not include homicidal or suicidal ideation. Thought content does not include homicidal or suicidal plan.        Cognition and Memory: Cognition and memory normal.        Judgment: Judgment normal.     Lab Review:     Component Value Date/Time   NA 140 12/01/2017 0908   K 5.1 12/01/2017 0908   CL 103 12/01/2017 0908   CO2 23 12/01/2017 0908   GLUCOSE 93 12/01/2017 0908   GLUCOSE 95 12/08/2016 1030   BUN 16 12/01/2017 0908   CREATININE 0.66 12/01/2017 0908   CREATININE 0.63 12/08/2016 1030   CALCIUM 9.2 12/01/2017 0908   PROT 6.6 12/01/2017 0908   ALBUMIN 4.3 12/01/2017 0908   AST 21 12/01/2017 0908   ALT 15 12/01/2017 0908   ALKPHOS 103 12/01/2017 0908   BILITOT 0.2 12/01/2017 0908   GFRNONAA 90 12/01/2017 0908   GFRAA 104 12/01/2017 0908       Component Value Date/Time   WBC 8.3  12/01/2017 0908   WBC 7.1 12/08/2016 1030   RBC 4.76 12/01/2017 0908   RBC 4.86 12/08/2016 1030   HGB 14.0 12/01/2017 0908   HCT 42.5 12/01/2017 0908   PLT 212 12/01/2017 0908   MCV 89 12/01/2017 0908   MCH 29.4 12/01/2017 0908   MCH 30.0 12/08/2016 1030   MCHC 32.9 12/01/2017 0908   MCHC 34.7 12/08/2016 1030   RDW 12.5 12/01/2017 0908   LYMPHSABS 2.0 12/01/2017  0908   MONOABS 546 12/25/2015 1405   EOSABS 0.2 12/01/2017 0908   BASOSABS 0.1 12/01/2017 0908    No results found for: POCLITH, LITHIUM   No results found for: PHENYTOIN, PHENOBARB, VALPROATE, CBMZ   .res Assessment: Plan:   Discussed potential benefits, risks, and side effects of increasing Cymbalta to 90 mg daily to improve mood and anxiety signs and symptoms since patient has had a partial response with Cymbalta 60 mg daily.  Discussed that doses above 60 mg have not been approved by the FDA and 90 mg daily is an off label use.  Patient agrees to increase in Cymbalta. Patient to follow-up in 1 to 2 months or sooner if clinically indicated. Patient advised to contact office with any questions, adverse effects, or acute worsening in signs and symptoms.  Megan Nunez was seen today for depression, anxiety and add.  Diagnoses and all orders for this visit:  Major depression, recurrent, chronic (HCC) -     DULoxetine (CYMBALTA) 30 MG capsule; Take 1 capsule (30 mg total) by mouth daily. Take with one 60 mg capsule to equal total dose of 90 mg. -     DULoxetine (CYMBALTA) 60 MG capsule; Take 1 capsule (60 mg total) by mouth every morning.  Attention deficit hyperactivity disorder (ADHD), predominantly inattentive type -     amphetamine-dextroamphetamine (ADDERALL XR) 15 MG 24 hr capsule; Take 1 capsule by mouth every morning. -     amphetamine-dextroamphetamine (ADDERALL XR) 15 MG 24 hr capsule; Take 1 capsule by mouth every morning.     Please see After Visit Summary for patient specific instructions.  No future appointments.  No orders of the defined types were placed in this encounter.     -------------------------------

## 2018-12-13 ENCOUNTER — Other Ambulatory Visit: Payer: Self-pay

## 2018-12-23 ENCOUNTER — Other Ambulatory Visit: Payer: Self-pay | Admitting: Physician Assistant

## 2018-12-23 DIAGNOSIS — F339 Major depressive disorder, recurrent, unspecified: Secondary | ICD-10-CM

## 2018-12-27 ENCOUNTER — Other Ambulatory Visit: Payer: Self-pay | Admitting: Psychiatry

## 2018-12-27 DIAGNOSIS — F339 Major depressive disorder, recurrent, unspecified: Secondary | ICD-10-CM

## 2019-02-09 ENCOUNTER — Encounter: Payer: Self-pay | Admitting: Psychiatry

## 2019-02-09 ENCOUNTER — Ambulatory Visit (INDEPENDENT_AMBULATORY_CARE_PROVIDER_SITE_OTHER): Payer: Medicare Other | Admitting: Psychiatry

## 2019-02-09 DIAGNOSIS — F900 Attention-deficit hyperactivity disorder, predominantly inattentive type: Secondary | ICD-10-CM

## 2019-02-09 DIAGNOSIS — F339 Major depressive disorder, recurrent, unspecified: Secondary | ICD-10-CM | POA: Diagnosis not present

## 2019-02-09 MED ORDER — DULOXETINE HCL 30 MG PO CPEP: ORAL_CAPSULE | ORAL | 0 refills | Status: DC

## 2019-02-09 MED ORDER — BUPROPION HCL ER (XL) 300 MG PO TB24: 300.0000 mg | ORAL_TABLET | Freq: Every day | ORAL | 0 refills | Status: DC

## 2019-02-09 MED ORDER — AMPHETAMINE-DEXTROAMPHET ER 15 MG PO CP24: 15.0000 mg | ORAL_CAPSULE | ORAL | 0 refills | Status: DC

## 2019-02-09 MED ORDER — DULOXETINE HCL 60 MG PO CPEP: 60.0000 mg | ORAL_CAPSULE | Freq: Every morning | ORAL | 1 refills | Status: DC

## 2019-02-09 NOTE — Progress Notes (Signed)
Megan Nunez [id number] 1945/06/17 74 y.o.  Virtual Visit via Telephone Note  I connected with pt on 02/09/19 at 11:00 AM EST by telephone and verified that I am speaking with the correct person using two identifiers.   I discussed the limitations, risks, security and privacy concerns of performing an evaluation and management service by telephone and the availability of in person appointments. I also discussed with the patient that there may be a patient responsible charge related to this service. The patient expressed understanding and agreed to proceed.   I discussed the assessment and treatment plan with the patient. The patient was provided an opportunity to ask questions and all were answered. The patient agreed with the plan and demonstrated an understanding of the instructions.   The patient was advised to call back or seek an in-person evaluation if the symptoms worsen or if the condition fails to improve as anticipated.  I provided 30 minutes of non-face-to-face time during this encounter.  The patient was located at home.  The provider was located at [facility].   Corie Chiquito, PMHNP   Subjective:   Patient ID:  Megan Nunez is a 74 y.o. (DOB 01-Dec-1945) female.  Chief Complaint:  Chief Complaint  Patient presents with  . Follow-up    h/o Deprression, Anxiety, and ADD.     HPI Megan Nunez presents for follow-up of depression, anxiety, and ADD. She reports that her mood has been improved since presidential inauguration. She reports that she and her husband have been having some difficulties. She reports that her husband wants her to take multiple precautions re: covid and she has difficulty remembering different procedures he requests. She reports that this was negatively affecting her mood last month. She reports that this situation has improved and they had a productive counseling session yesterday and that husband is recognizing that she is trying her best.  She reports that she had some anxiety during period of conflict and this has improved. She reports that her concentration has been ok overall. She reports that she is trying to focus on the task at hand instead of all the things that she needs to accomplish. She reports that energy and motivation have been low. Has had several mornings where she has been staying in bed longer some days. She reports that she had altered sleep cycle and this has been improving and is close to 8 hours a night. Denies SI.   Notices she enjoys singing and plans to start voice lessons.   Past Psychiatric Medication Trials: Sertraline Prozac Cymbalta Wellbutrin XL Adderall Adderall XR Review of Systems:  Review of Systems  Cardiovascular: Negative for palpitations.  Musculoskeletal: Positive for arthralgias. Negative for gait problem.  Neurological: Negative for tremors and headaches.  Psychiatric/Behavioral:       Please refer to HPI    Medications: I have reviewed the patient's current medications.  Current Outpatient Medications  Medication Sig Dispense Refill  . acetaminophen (TYLENOL) 325 MG tablet Take 650 mg by mouth every 6 (six) hours as needed for mild pain.    Marland Kitchen amphetamine-dextroamphetamine (ADDERALL XR) 15 MG 24 hr capsule Take 1 capsule by mouth every morning. 30 capsule 0  . Ascorbic Acid (VITAMIN C) 100 MG tablet Take 100 mg by mouth daily.    . Biotin (BIOTIN 5000) 5 MG CAPS Take 2 capsules by mouth daily.     Marland Kitchen buPROPion (WELLBUTRIN XL) 300 MG 24 hr tablet Take 1 tablet (300 mg total) by mouth  daily. 90 tablet 0  . cholecalciferol (VITAMIN D3) 25 MCG (1000 UNIT) tablet Take 1,000 Units by mouth daily.    . DULoxetine (CYMBALTA) 30 MG capsule TAKE ONE CAPSULE BY MOUTH DAILY TAKE WITH 60MG  CAP 90 capsule 0  . Glucosamine-Chondroitin 750-600 MG CHEW Chew 2 tablets by mouth daily.    . Multiple Vitamins-Minerals (MULTIVITAMIN WITH MINERALS) tablet Take 1 tablet by mouth daily.    . naproxen  (NAPROSYN) 375 MG tablet Take 375 mg by mouth 2 (two) times daily with a meal.    . Turmeric Curcumin 500 MG CAPS Take 1 capsule by mouth daily.     . vitamin B-12 (CYANOCOBALAMIN) 1000 MCG tablet Take 1,000 mcg by mouth daily.    ON 03/24/2019] amphetamine-dextroamphetamine (ADDERALL XR) 15 MG 24 hr capsule Take 1 capsule by mouth every morning. 30 capsule 0  . [START ON 02/24/2019] amphetamine-dextroamphetamine (ADDERALL XR) 15 MG 24 hr capsule Take 1 capsule by mouth every morning. 30 capsule 0  . DULoxetine (CYMBALTA) 60 MG capsule Take 1 capsule (60 mg total) by mouth every morning. 90 capsule 1   Current Facility-Administered Medications  Medication Dose Route Frequency Provider Last Rate Last Admin  . methylPREDNISolone acetate (DEPO-MEDROL) injection 40 mg  40 mg Intra-articular Once Hilts, Michael, MD        Medication Side Effects: Other: dry mouth  Allergies: No Known Allergies  Past Medical History:  Diagnosis Date  . Arthritis   . Depression   . Obesity     Family History  Problem Relation Age of Onset  . Cancer Brother 50       pancreatic cancer  . Alcohol abuse Brother   . Depression Brother   . Asthma Paternal Grandmother   . Heart disease Mother        Mitral valve disease.  . Dementia Mother   . Lung disease Father        Idiopathic pulmonary fibrosis  . Heart disease Maternal Grandmother        Died after complications of angioplasty.  . Depression Sister   . Alcohol abuse Sister   . Alcohol abuse Brother   . Depression Brother   . Alcohol abuse Brother   . Depression Cousin   . Depression Other     Social History   Socioeconomic History  . Marital status: Married    Spouse name: Not on file  . Number of children: Not on file  . Years of education: Not on file  . Highest education level: Not on file  Occupational History  . Not on file  Tobacco Use  . Smoking status: Never Smoker  . Smokeless tobacco: Never Used  Substance and Sexual  Activity  . Alcohol use: Yes    Alcohol/week: 1.0 standard drinks    Types: 1 Glasses of wine per week  . Drug use: No  . Sexual activity: Not Currently  Other Topics Concern  . Not on file  Social History Narrative   She is married to another patient of [facility]. They have been married for 20  years.  No children. 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   He never smoked, and drinks maybe 1-3 glass of wine a day.   He has recently started exercising with her husband who has completed cardiac rehabilitation and is in the maintenance program. They usually walks about 2-3 days a week for 30-45 minutes.   Social Determinants  of Health   Financial Resource Strain:   . Difficulty of Paying Living Expenses: Not on file  Food Insecurity:   . Worried About Charity fundraiser in the Last Year: Not on file  . Ran Out of Food in the Last Year: Not on file  Transportation Needs:   . Lack of Transportation (Medical): Not on file  . Lack of Transportation (Non-Medical): Not on file  Physical Activity:   . Days of Exercise per Week: Not on file  . Minutes of Exercise per Session: Not on file  Stress:   . Feeling of Stress : Not on file  Social Connections:   . Frequency of Communication with Friends and Family: Not on file  . Frequency of Social Gatherings with Friends and Family: Not on file  . Attends Religious Services: Not on file  . Active Member of Clubs or Organizations: Not on file  . Attends Archivist Meetings: Not on file  . Marital Status: Not on file  Intimate Partner Violence:   . Fear of Current or Ex-Partner: Not on file  . Emotionally Abused: Not on file  . Physically Abused: Not on file  . Sexually Abused: Not on file    Past Medical History, Surgical history, Social history, and Family history were reviewed and updated as appropriate.   Please see review of systems for further details on the patient's review from  today.   Objective:   Physical Exam:  Pulse 70   Wt 168 lb (76.2 kg)   BMI 32.81 kg/m   Physical Exam Neurological:     Mental Status: She is alert and oriented to person, place, and time.     Cranial Nerves: No dysarthria.  Psychiatric:        Attention and Perception: Attention and perception normal.        Mood and Affect: Mood normal.        Speech: Speech normal.        Behavior: Behavior is cooperative.        Thought Content: Thought content normal. Thought content is not paranoid or delusional. Thought content does not include homicidal or suicidal ideation. Thought content does not include homicidal or suicidal plan.        Cognition and Memory: Cognition and memory normal.        Judgment: Judgment normal.     Comments: Insight intact     Lab Review:     Component Value Date/Time   NA 140 12/01/2017 0908   K 5.1 12/01/2017 0908   CL 103 12/01/2017 0908   CO2 23 12/01/2017 0908   GLUCOSE 93 12/01/2017 0908   GLUCOSE 95 12/08/2016 1030   BUN 16 12/01/2017 0908   CREATININE 0.66 12/01/2017 0908   CREATININE 0.63 12/08/2016 1030   CALCIUM 9.2 12/01/2017 0908   PROT 6.6 12/01/2017 0908   ALBUMIN 4.3 12/01/2017 0908   AST 21 12/01/2017 0908   ALT 15 12/01/2017 0908   ALKPHOS 103 12/01/2017 0908   BILITOT 0.2 12/01/2017 0908   GFRNONAA 90 12/01/2017 0908   GFRAA 104 12/01/2017 0908       Component Value Date/Time   WBC 8.3 12/01/2017 0908   WBC 7.1 12/08/2016 1030   RBC 4.76 12/01/2017 0908   RBC 4.86 12/08/2016 1030   HGB 14.0 12/01/2017 0908   HCT 42.5 12/01/2017 0908   PLT 212 12/01/2017 0908   MCV 89 12/01/2017 0908   MCH 29.4 12/01/2017 0908   MCH  30.0 12/08/2016 1030   MCHC 32.9 12/01/2017 0908   MCHC 34.7 12/08/2016 1030   RDW 12.5 12/01/2017 0908   LYMPHSABS 2.0 12/01/2017 0908   MONOABS 546 12/25/2015 1405   EOSABS 0.2 12/01/2017 0908   BASOSABS 0.1 12/01/2017 0908    No results found for: POCLITH, LITHIUM   No results found for:  PHENYTOIN, PHENOBARB, VALPROATE, CBMZ   .res Assessment: Plan:   Patient seen for 30 minutes and time spent discussing current medication and symptoms.  Patient reports that she recently had some worsening depression, that she attributes to psychosocial stressors and her mood has recently improved in response to resolution and improvement of some stressors.  She reports that she would like to continue current plan of care with close monitoring in case signs and symptoms worsen again. Continue Cymbalta 90 mg daily for depression and anxiety. Continue Wellbutrin XL 300 mg daily for depression. Continue Adderall XR 15 mg p.o. every morning for attention deficit disorder. Recommend continuing psychotherapy. Patient to follow-up with this provider in 6 weeks or sooner if clinically indicated.  Tera was seen today for follow-up.  Diagnoses and all orders for this visit:  Attention deficit hyperactivity disorder (ADHD), predominantly inattentive type -     amphetamine-dextroamphetamine (ADDERALL XR) 15 MG 24 hr capsule; Take 1 capsule by mouth every morning. -     amphetamine-dextroamphetamine (ADDERALL XR) 15 MG 24 hr capsule; Take 1 capsule by mouth every morning.  Major depression, recurrent, chronic (HCC) -     DULoxetine (CYMBALTA) 30 MG capsule; TAKE ONE CAPSULE BY MOUTH DAILY TAKE WITH 60MG  CAP -     DULoxetine (CYMBALTA) 60 MG capsule; Take 1 capsule (60 mg total) by mouth every morning. -     buPROPion (WELLBUTRIN XL) 300 MG 24 hr tablet; Take 1 tablet (300 mg total) by mouth daily.    Please see After Visit Summary for patient specific instructions.  Future Appointments  Date Time Provider Department Center  03/26/2019 10:30 AM 05/26/2019, PMHNP [facility] None    No orders of the defined types were placed in this encounter.     -------------------------------

## 2019-02-25 ENCOUNTER — Ambulatory Visit: Payer: Medicare Other | Attending: Internal Medicine

## 2019-02-25 DIAGNOSIS — Z23 Encounter for immunization: Secondary | ICD-10-CM

## 2019-02-25 NOTE — Progress Notes (Signed)
   Covid-19 Vaccination Clinic  Name:  Megan Nunez    MRN: [id number] DOB: [birth year]  02/25/2019  [name] was observed post Covid-19 immunization for 15 minutes without incidence. She was provided with Vaccine Information Sheet and instruction to access the V-Safe system.   [name] was instructed to call 911 with any severe reactions post vaccine: Marland Kitchen Difficulty breathing  . Swelling of your face and throat  . A fast heartbeat  . A bad rash all over your body  . Dizziness and weakness    Immunizations Administered    Name Date Dose VIS Date Route   Pfizer COVID-19 Vaccine 02/25/2019  5:49 PM 0.3 mL 12/29/2018 Intramuscular   Manufacturer: [employer], [employer]   Lot: [id number]   NDC: [address]-8

## 2019-03-15 ENCOUNTER — Ambulatory Visit: Payer: Medicare Other

## 2019-03-22 ENCOUNTER — Ambulatory Visit: Payer: Medicare Other | Attending: Internal Medicine

## 2019-03-22 DIAGNOSIS — Z23 Encounter for immunization: Secondary | ICD-10-CM

## 2019-03-22 NOTE — Progress Notes (Signed)
   Covid-19 Vaccination Clinic  Name:  Megan Nunez    MRN: [id number] DOB: 1945/10/28  03/22/2019  [name] was observed post Covid-19 immunization for 15 minutes without incident. She was provided with Vaccine Information Sheet and instruction to access the V-Safe system.   [name] was instructed to call 911 with any severe reactions post vaccine: Marland Kitchen Difficulty breathing  . Swelling of face and throat  . A fast heartbeat  . A bad rash all over body  . Dizziness and weakness   Immunizations Administered    Name Date Dose VIS Date Route   Pfizer COVID-19 Vaccine 03/22/2019  3:56 PM 0.3 mL 12/29/2018 Intramuscular   Manufacturer: [employer], [employer]   Lot: [id number]   NDC: [address]-1

## 2019-03-26 ENCOUNTER — Ambulatory Visit (INDEPENDENT_AMBULATORY_CARE_PROVIDER_SITE_OTHER): Payer: Medicare Other | Admitting: Psychiatry

## 2019-03-26 ENCOUNTER — Encounter: Payer: Self-pay | Admitting: Psychiatry

## 2019-03-26 DIAGNOSIS — F339 Major depressive disorder, recurrent, unspecified: Secondary | ICD-10-CM | POA: Diagnosis not present

## 2019-03-26 DIAGNOSIS — F900 Attention-deficit hyperactivity disorder, predominantly inattentive type: Secondary | ICD-10-CM | POA: Diagnosis not present

## 2019-03-26 MED ORDER — DULOXETINE HCL 60 MG PO CPEP: 60.0000 mg | ORAL_CAPSULE | Freq: Every morning | ORAL | 1 refills | Status: DC

## 2019-03-26 MED ORDER — DULOXETINE HCL 30 MG PO CPEP: ORAL_CAPSULE | ORAL | 1 refills | Status: DC

## 2019-03-26 MED ORDER — AMPHETAMINE-DEXTROAMPHET ER 15 MG PO CP24: 15.0000 mg | ORAL_CAPSULE | ORAL | 0 refills | Status: DC

## 2019-03-26 MED ORDER — BUPROPION HCL ER (XL) 300 MG PO TB24: 300.0000 mg | ORAL_TABLET | Freq: Every day | ORAL | 0 refills | Status: DC

## 2019-03-26 NOTE — Progress Notes (Signed)
Megan Nunez [id number] [birth year] 74 y.o.  Virtual Visit via Video Note  I connected with pt @ on 03/26/19 at 10:30 AM EST by a video enabled telemedicine application and verified that I am speaking with the correct person using two identifiers.   I discussed the limitations of evaluation and management by telemedicine and the availability of in person appointments. The patient expressed understanding and agreed to proceed.  I discussed the assessment and treatment plan with the patient. The patient was provided an opportunity to ask questions and all were answered. The patient agreed with the plan and demonstrated an understanding of the instructions.   The patient was advised to call back or seek an in-person evaluation if the symptoms worsen or if the condition fails to improve as anticipated.  I provided 30 minutes of non-face-to-face time during this encounter.  The patient was located at home.  The provider was located at [facility].   Megan Nunez, PMHNP   Subjective:   Patient ID:  Megan Nunez is a 74 y.o. (DOB 1945/04/25) female.  Chief Complaint:  Chief Complaint  Patient presents with  . Depression  . Anxiety  . ADD    HPI Megan Nunez presents for follow-up of ADD, Depression, and anxiety. Describes mood as "very up and down." She reports she has had some episodic depression and anxiety. She reports that she was staying up late for a period of time like her husband does, but was waking up early. She then has been trying to go to bed earlier and reports that sleep schedule has been inconsistent. She reports that she has been having difficulty being productive due to low energy and motivation. She reports that she is also less productive has not taken Adderall. She reports that without Adderall she has difficulty sustaining focus. Mood has been low. Looking forward to being with people. She reports that she has worry about if her husband will be upset by  different things. She reports getting discouraged when she tries to do something and it goes wrong. She reports occ procrastination. She reports that her church friends are very supportive. She reports that, "in a lot of ways we are learning to cope." Denies SI.   She reports that she would like to change some of her behavioral patterns. She reports that she and her husband have been managing conflict better.   Had second vaccination recently. Had to see dentist to fill cavities.   Past Psychiatric Medication Trials: Sertraline Prozac Cymbalta Wellbutrin XL Adderall Adderall XR  Review of Systems:  Review of Systems  Respiratory:       Short of breath on exertion.   Musculoskeletal: Negative for gait problem.       Knee pain  Skin:       Severe itching and thinks it may be related to shingles.   Psychiatric/Behavioral:       Please refer to HPI    Medications: I have reviewed the patient's current medications.  Current Outpatient Medications  Medication Sig Dispense Refill  . acetaminophen (TYLENOL) 325 MG tablet Take 650 mg by mouth every 6 (six) hours as needed for mild pain.    Marland Kitchen amphetamine-dextroamphetamine (ADDERALL XR) 15 MG 24 hr capsule Take 1 capsule by mouth every morning. 30 capsule 0  . [START ON 06/05/2019] amphetamine-dextroamphetamine (ADDERALL XR) 15 MG 24 hr capsule Take 1 capsule by mouth every morning. 30 capsule 0  . [START ON 05/08/2019] amphetamine-dextroamphetamine (ADDERALL XR) 15 MG 24  hr capsule Take 1 capsule by mouth every morning. 30 capsule 0  . Ascorbic Acid (VITAMIN C) 100 MG tablet Take 100 mg by mouth daily.    . Biotin (BIOTIN 5000) 5 MG CAPS Take 2 capsules by mouth daily.     Marland Kitchen buPROPion (WELLBUTRIN XL) 300 MG 24 hr tablet Take 1 tablet (300 mg total) by mouth daily. 90 tablet 0  . cholecalciferol (VITAMIN D3) 25 MCG (1000 UNIT) tablet Take 1,000 Units by mouth daily.    . DULoxetine (CYMBALTA) 30 MG capsule TAKE ONE CAPSULE BY MOUTH DAILY  TAKE WITH 60MG  CAP 90 capsule 1  . DULoxetine (CYMBALTA) 60 MG capsule Take 1 capsule (60 mg total) by mouth every morning. 90 capsule 1  . Glucosamine-Chondroitin 750-600 MG CHEW Chew 2 tablets by mouth daily.    . Multiple Vitamins-Minerals (MULTIVITAMIN WITH MINERALS) tablet Take 1 tablet by mouth daily.    . naproxen (NAPROSYN) 375 MG tablet Take 375 mg by mouth 2 (two) times daily with a meal.    . Turmeric Curcumin 500 MG CAPS Take 1 capsule by mouth daily.     . vitamin B-12 (CYANOCOBALAMIN) 1000 MCG tablet Take 1,000 mcg by mouth daily.     Current Facility-Administered Medications  Medication Dose Route Frequency Provider Last Rate Last Admin  . methylPREDNISolone acetate (DEPO-MEDROL) injection 40 mg  40 mg Intra-articular Once Hilts, Michael, MD        Medication Side Effects: None  Allergies: No Known Allergies  Past Medical History:  Diagnosis Date  . Arthritis   . Depression   . Obesity     Family History  Problem Relation Age of Onset  . Cancer Brother 50       pancreatic cancer  . Alcohol abuse Brother   . Depression Brother   . Asthma Paternal Grandmother   . Heart disease Mother        Mitral valve disease.  . Dementia Mother   . Lung disease Father        Idiopathic pulmonary fibrosis  . Heart disease Maternal Grandmother        Died after complications of angioplasty.  . Depression Sister   . Alcohol abuse Sister   . Alcohol abuse Brother   . Depression Brother   . Alcohol abuse Brother   . Depression Cousin   . Depression Other     Social History   Socioeconomic History  . Marital status: Married    Spouse name: Not on file  . Number of children: Not on file  . Years of education: Not on file  . Highest education level: Not on file  Occupational History  . Not on file  Tobacco Use  . Smoking status: Never Smoker  . Smokeless tobacco: Never Used  Substance and Sexual Activity  . Alcohol use: Yes    Alcohol/week: 1.0 standard drinks     Types: 1 Glasses of wine per week  . Drug use: No  . Sexual activity: Not Currently  Other Topics Concern  . Not on file  Social History Narrative   She is married to another patient of [facility]. They have been married for 20  years.  No children. 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   He never smoked, and drinks maybe 1-3 glass of wine a day.   He has recently started exercising with her husband who has completed cardiac rehabilitation and is in the maintenance  program. They usually walks about 2-3 days a week for 30-45 minutes.   Social Determinants of Health   Financial Resource Strain:   . Difficulty of Paying Living Expenses: Not on file  Food Insecurity:   . Worried About Charity fundraiser in the Last Year: Not on file  . Ran Out of Food in the Last Year: Not on file  Transportation Needs:   . Lack of Transportation (Medical): Not on file  . Lack of Transportation (Non-Medical): Not on file  Physical Activity:   . Days of Exercise per Week: Not on file  . Minutes of Exercise per Session: Not on file  Stress:   . Feeling of Stress : Not on file  Social Connections:   . Frequency of Communication with Friends and Family: Not on file  . Frequency of Social Gatherings with Friends and Family: Not on file  . Attends Religious Services: Not on file  . Active Member of Clubs or Organizations: Not on file  . Attends Archivist Meetings: Not on file  . Marital Status: Not on file  Intimate Partner Violence:   . Fear of Current or Ex-Partner: Not on file  . Emotionally Abused: Not on file  . Physically Abused: Not on file  . Sexually Abused: Not on file    Past Medical History, Surgical history, Social history, and Family history were reviewed and updated as appropriate.   Please see review of systems for further details on the patient's review from today.   Objective:   Physical Exam:  Pulse 70   Physical  Exam Constitutional:      General: She is not in acute distress. Musculoskeletal:        General: No deformity.  Neurological:     Mental Status: She is alert and oriented to person, place, and time.     Coordination: Coordination normal.  Psychiatric:        Attention and Perception: Attention and perception normal. She does not perceive auditory or visual hallucinations.        Mood and Affect: Affect is not labile, blunt, angry or inappropriate.        Speech: Speech normal.        Behavior: Behavior normal.        Thought Content: Thought content normal. Thought content is not paranoid or delusional. Thought content does not include homicidal or suicidal ideation. Thought content does not include homicidal or suicidal plan.        Cognition and Memory: Cognition and memory normal.        Judgment: Judgment normal.     Comments: Insight intact Mood is appropriate to content. Affect is congruent     Lab Review:     Component Value Date/Time   NA 140 12/01/2017 0908   K 5.1 12/01/2017 0908   CL 103 12/01/2017 0908   CO2 23 12/01/2017 0908   GLUCOSE 93 12/01/2017 0908   GLUCOSE 95 12/08/2016 1030   BUN 16 12/01/2017 0908   CREATININE 0.66 12/01/2017 0908   CREATININE 0.63 12/08/2016 1030   CALCIUM 9.2 12/01/2017 0908   PROT 6.6 12/01/2017 0908   ALBUMIN 4.3 12/01/2017 0908   AST 21 12/01/2017 0908   ALT 15 12/01/2017 0908   ALKPHOS 103 12/01/2017 0908   BILITOT 0.2 12/01/2017 0908   GFRNONAA 90 12/01/2017 0908   GFRAA 104 12/01/2017 0908       Component Value Date/Time   WBC 8.3 12/01/2017 0908  WBC 7.1 12/08/2016 1030   RBC 4.76 12/01/2017 0908   RBC 4.86 12/08/2016 1030   HGB 14.0 12/01/2017 0908   HCT 42.5 12/01/2017 0908   PLT 212 12/01/2017 0908   MCV 89 12/01/2017 0908   MCH 29.4 12/01/2017 0908   MCH 30.0 12/08/2016 1030   MCHC 32.9 12/01/2017 0908   MCHC 34.7 12/08/2016 1030   RDW 12.5 12/01/2017 0908   LYMPHSABS 2.0 12/01/2017 0908   MONOABS 546  12/25/2015 1405   EOSABS 0.2 12/01/2017 0908   BASOSABS 0.1 12/01/2017 0908    No results found for: POCLITH, LITHIUM   No results found for: PHENYTOIN, PHENOBARB, VALPROATE, CBMZ   .res Assessment: Plan:   Discussed recent mood signs and symptoms and patient is able to identify triggers for changes in mood.  She also anticipates some of the underlying stressors to improve in the near future.  Discussed that it may be helpful for her to see her therapist individually to address some of her concerns and triggers. Will continue Cymbalta 90 mg daily for depression and anxiety. Will continue Adderall XR 15 mg in the morning for attention deficit. Continue Wellbutrin XL 300 mg daily for depression.  Eleesha was seen today for depression, anxiety and add.  Diagnoses and all orders for this visit:  Major depression, recurrent, chronic (HCC) -     DULoxetine (CYMBALTA) 30 MG capsule; TAKE ONE CAPSULE BY MOUTH DAILY TAKE WITH 60MG  CAP -     DULoxetine (CYMBALTA) 60 MG capsule; Take 1 capsule (60 mg total) by mouth every morning. -     buPROPion (WELLBUTRIN XL) 300 MG 24 hr tablet; Take 1 tablet (300 mg total) by mouth daily.  Attention deficit hyperactivity disorder (ADHD), predominantly inattentive type -     amphetamine-dextroamphetamine (ADDERALL XR) 15 MG 24 hr capsule; Take 1 capsule by mouth every morning. -     amphetamine-dextroamphetamine (ADDERALL XR) 15 MG 24 hr capsule; Take 1 capsule by mouth every morning.     Please see After Visit Summary for patient specific instructions.  No future appointments.  No orders of the defined types were placed in this encounter.     -------------------------------

## 2019-04-09 ENCOUNTER — Ambulatory Visit (INDEPENDENT_AMBULATORY_CARE_PROVIDER_SITE_OTHER): Payer: Medicare Other | Admitting: Family Medicine

## 2019-04-09 ENCOUNTER — Other Ambulatory Visit: Payer: Self-pay

## 2019-04-09 ENCOUNTER — Encounter: Payer: Self-pay | Admitting: Family Medicine

## 2019-04-09 VITALS — BP 132/80 | HR 82 | Temp 97.1°F | Ht 60.0 in | Wt 169.4 lb

## 2019-04-09 DIAGNOSIS — E669 Obesity, unspecified: Secondary | ICD-10-CM

## 2019-04-09 DIAGNOSIS — M1711 Unilateral primary osteoarthritis, right knee: Secondary | ICD-10-CM

## 2019-04-09 DIAGNOSIS — M1990 Unspecified osteoarthritis, unspecified site: Secondary | ICD-10-CM

## 2019-04-09 DIAGNOSIS — F900 Attention-deficit hyperactivity disorder, predominantly inattentive type: Secondary | ICD-10-CM | POA: Diagnosis not present

## 2019-04-09 DIAGNOSIS — M19011 Primary osteoarthritis, right shoulder: Secondary | ICD-10-CM

## 2019-04-09 DIAGNOSIS — Z0000 Encounter for general adult medical examination without abnormal findings: Secondary | ICD-10-CM

## 2019-04-09 DIAGNOSIS — Z13220 Encounter for screening for lipoid disorders: Secondary | ICD-10-CM | POA: Diagnosis not present

## 2019-04-09 DIAGNOSIS — F330 Major depressive disorder, recurrent, mild: Secondary | ICD-10-CM

## 2019-04-09 NOTE — Progress Notes (Signed)
Megan Nunez is a 74 y.o. female who presents for annual wellness visit,CPE and follow-up on chronic medical conditions.  She has continued difficulty with bilateral knee pain interfering with her exercising.  She is also had a right shoulder pain and has had an injection.  She did get physical therapy but has not followed up with that as she should have.  She continues on her psychotropic medications and is doing quite nicely on them.  She does have underlying ADHD and is getting good results with her Adderall.  This is again being followed by psychiatry.  Her main issue seems to be the arthritic pain in her knees and shoulders.  Otherwise her social and family history is unremarkable.  She is now retired and married.  The marriage is going well.  Immunizations and Health Maintenance Immunization History  Administered Date(s) Administered  . Influenza Split 01/27/2011  . Influenza, High Dose Seasonal PF 11/14/2013, 11/05/2014, 12/25/2015, 09/23/2016, 01/03/2018  . Influenza,inj,Quad PF,6+ Mos 03/20/2013  . PFIZER SARS-COV-2 Vaccination 02/25/2019, 03/22/2019  . Pneumococcal Conjugate-13 07/12/2013  . Pneumococcal Polysaccharide-23 11/05/2014  . Tdap 08/27/2008, 02/12/2018  . Zoster 12/30/2008  . Zoster Recombinat (Shingrix) 02/12/2018, 08/04/2018   Health Maintenance Due  Topic Date Due  . INFLUENZA VACCINE  08/19/2018    Last Pap smear: aged out  Last mammogram: 01/19/2018 Last colonoscopy: 01/19/2007 Last DEXA: 05/06/16 Dentist: Q six months Ophtho: once a year  Exercise: not really   Other doctors caring for patient include:  Dr.XU ortho  Advanced directives: Not on file  Copy asked for Does Patient Have a Medical Advance Directive?: Yes Type of Advance Directive: Healthcare Power of Attorney, Living will Does patient want to make changes to medical advance directive?: No - Patient declined Copy of Healthcare Power of Attorney in Chart?: No - copy requested  Depression screen:  See  questionnaire below.  Depression screen [facility] 2/9 04/09/2019 01/03/2018 11/16/2017 12/08/2016 12/25/2015  Decreased Interest 1 2 0 0 0  Down, Depressed, Hopeless 1 1 1  0 0  PHQ - 2 Score 2 3 1  0 0  Altered sleeping 1 1 - - -  Tired, decreased energy 2 1 - - -  Change in appetite 2 0 - - -  Feeling bad or failure about yourself  3 3 - - -  Trouble concentrating 3 3 - - -  Moving slowly or fidgety/restless 0 2 - - -  Suicidal thoughts 0 0 - - -  PHQ-9 Score 13 13 - - -  Difficult doing work/chores Somewhat difficult Somewhat difficult - - -    Fall Risk Screen: see questionnaire below. Fall Risk  04/09/2019 12/13/2018 01/03/2018 12/05/2017 11/16/2017  Falls in the past year? 0 0 0 0 No  Comment - Emmi Telephone Survey: data to providers prior to load - Emmi Telephone Survey: data to providers prior to load -    ADL screen:  See questionnaire below Functional Status Survey: Is the patient deaf or have difficulty hearing?: No Does the patient have difficulty seeing, even when wearing glasses/contacts?: No Does the patient have difficulty concentrating, remembering, or making decisions?: Yes Does the patient have difficulty walking or climbing stairs?: Yes(due to knees) Does the patient have difficulty dressing or bathing?: No Does the patient have difficulty doing errands alone such as visiting a doctor's office or shopping?: No   Review of Systems Constitutional: -, -unexpected weight change, -anorexia, -fatigue Allergy: -sneezing, -itching, -congestion Dermatology: denies changing moles, rash, lumps ENT: -runny nose, -  ear pain, -sore throat,  Cardiology:  -chest pain, -palpitations, -orthopnea, Respiratory: -cough, -shortness of breath, -dyspnea on exertion, -wheezing,  Gastroenterology: -abdominal pain, -nausea, -vomiting, -diarrhea, -constipation, -dysphagia Hematology: -bleeding or bruising problems Musculoskeletal: -arthralgias, -myalgias, -joint swelling, -back pain,  - Ophthalmology: -vision changes,  Urology: -dysuria, -difficulty urinating,  -urinary frequency, -urgency, incontinence Neurology: -, -numbness, , -memory loss, -falls, -dizziness    PHYSICAL EXAM:  BP 132/80 (BP Location: Left Arm, Patient Position: Sitting)   Pulse 82   Temp (!) 97.1 F (36.2 C)   Ht 5' (1.524 m)   Wt 169 lb 6.4 oz (76.8 kg)   SpO2 96%   BMI 33.08 kg/m   General Appearance: Alert, cooperative, no distress, appears stated age Head: Normocephalic, without obvious abnormality, atraumatic Eyes: PERRL, conjunctiva/corneas clear, EOM's intact, Ears: Normal TM's and external ear canals Nose: Nares normal, mucosa normal, no drainage or sinus tenderness Throat: Lips, mucosa, and tongue normal; teeth and gums normal Neck: Supple, no lymphadenopathy;  thyroid:  no enlargement/tenderness/nodules; no carotid bruit or JVD Lungs: Clear to auscultation bilaterally without wheezes, rales or ronchi; respirations unlabored Heart: Regular rate and rhythm, S1 and S2 normal, no murmur, rubor gallop Abdomen: Soft, non-tender, nondistended, normoactive bowel sounds,  no masses, no hepatosplenomegaly Extremities: No clubbing, cyanosis or edema Pulses: 2+ and symmetric all extremities Skin:  Skin color, texture, turgor normal, no rashes or lesions Lymph nodes: Cervical, supraclavicular, and axillary nodes normal Neurologic:  CNII-XII intact, normal strength, sensation and gait; reflexes 2+ and symmetric throughout Psych: Normal mood, affect, hygiene and grooming.  ASSESSMENT/PLAN: Routine general medical examination at a health care facility - Plan: CBC with Differential/Platelet, Comprehensive metabolic panel, Lipid panel  Primary osteoarthritis, right shoulder  Primary osteoarthritis of right knee  Arthritis  ADHD, predominantly inattentive type  Major depressive disorder, recurrent episode, mild (HCC)  Obesity (BMI 30.0-34.9) - Plan: CBC with Differential/Platelet,  Comprehensive metabolic panel, Lipid panel  Screening for lipid disorders - Plan: Lipid panel  Discussed yearly mammograms; at least 30 minutes of aerobic activity at least 5 days/week and weight-bearing exercise 2x/week;  healthy diet, including goals of calcium and vitamin D intake and alcohol recommendations (less than or equal to 1 drink/day)   Immunization recommendations discussed.  Colonoscopy recommendations reviewed Encouraged her to check with orthopedics to get another shoulder injection and make sure she does to follow-up physical therapy. Then discussed the use of 2 Tylenol prior to any physical activity to see if that will help with her knees and slowly increase her physical activity based on pain and shortness of breath getting to at least 20 minutes of something physical daily.  Also discussed dietary modification in regard to cutting back on carbohydrates.  Continue with counseling.  Medicare Attestation I have personally reviewed: The patient's medical and social history Their use of alcohol, tobacco or illicit drugs Their current medications and supplements The patient's functional ability including ADLs,fall risks, home safety risks, cognitive, and hearing and visual impairment Diet and physical activities Evidence for depression or mood disorders  The patient's weight, height, and BMI have been recorded in the chart.  I have made referrals, counseling, and provided education to the patient based on review of the above and I have provided the patient with a written personalized care plan for preventive services.     Jill Alexanders, MD   04/09/2019

## 2019-04-09 NOTE — Patient Instructions (Signed)
  Megan Nunez , Thank you for taking time to come for your Medicare Wellness Visit. I appreciate your ongoing commitment to your health goals. Please review the following plan we discussed and let me know if I can assist you in the future.   These are the goals we discussed: Take 2 Tylenol prior to going out for a walk and pace yourself.  Slowly increase your physical activity so you can get up to 20 minutes of something every day. Cut back on carbohydrates.  The easiest way to remember this is white food. Your goal is to be able to go back to [place] and [place] and walk comfortably This is a list of the screening recommended for you and due dates:  Health Maintenance  Topic Date Due  . Flu Shot  08/19/2018  . Mammogram  01/20/2020  . Cologuard (Stool DNA test)  02/04/2021  . Tetanus Vaccine  02/13/2028  . DEXA scan (bone density measurement)  Completed  .  Hepatitis C: One time screening is recommended by Center for Disease Control  (CDC) for  adults born from [birth year] through 1963.   Completed  . Pneumonia vaccines  Completed

## 2019-04-10 LAB — COMPREHENSIVE METABOLIC PANEL
ALT: 14 IU/L (ref 0–32)
AST: 23 IU/L (ref 0–40)
Albumin/Globulin Ratio: 1.8 (ref 1.2–2.2)
Albumin: 4.5 g/dL (ref 3.7–4.7)
Alkaline Phosphatase: 115 IU/L (ref 39–117)
BUN/Creatinine Ratio: 22 (ref 12–28)
BUN: 17 mg/dL (ref 8–27)
Bilirubin Total: 0.2 mg/dL (ref 0.0–1.2)
CO2: 18 mmol/L — ABNORMAL LOW (ref 20–29)
Calcium: 9.3 mg/dL (ref 8.7–10.3)
Chloride: 106 mmol/L (ref 96–106)
Creatinine, Ser: 0.78 mg/dL (ref 0.57–1.00)
GFR calc Af Amer: 88 mL/min/{1.73_m2} (ref >59)
GFR calc non Af Amer: 77 mL/min/{1.73_m2} (ref >59)
Globulin, Total: 2.5 g/dL (ref 1.5–4.5)
Glucose: 102 mg/dL — ABNORMAL HIGH (ref 65–99)
Potassium: 5.2 mmol/L (ref 3.5–5.2)
Sodium: 144 mmol/L (ref 134–144)
Total Protein: 7 g/dL (ref 6.0–8.5)

## 2019-04-10 LAB — CBC WITH DIFFERENTIAL/PLATELET
Basophils Absolute: 0.1 10*3/uL (ref 0.0–0.2)
Basos: 1 %
EOS (ABSOLUTE): 0.4 10*3/uL (ref 0.0–0.4)
Eos: 4 %
Hematocrit: 43.2 % (ref 34.0–46.6)
Hemoglobin: 14.7 g/dL (ref 11.1–15.9)
Immature Grans (Abs): 0 10*3/uL (ref 0.0–0.1)
Immature Granulocytes: 0 %
Lymphocytes Absolute: 2.1 10*3/uL (ref 0.7–3.1)
Lymphs: 23 %
MCH: 30.2 pg (ref 26.6–33.0)
MCHC: 34 g/dL (ref 31.5–35.7)
MCV: 89 fL (ref 79–97)
Monocytes Absolute: 0.6 10*3/uL (ref 0.1–0.9)
Monocytes: 7 %
Neutrophils Absolute: 6 10*3/uL (ref 1.4–7.0)
Neutrophils: 65 %
Platelets: 224 10*3/uL (ref 150–450)
RBC: 4.86 x10E6/uL (ref 3.77–5.28)
RDW: 12.8 % (ref 11.7–15.4)
WBC: 9.2 10*3/uL (ref 3.4–10.8)

## 2019-04-10 LAB — LIPID PANEL
Chol/HDL Ratio: 2.5 ratio (ref 0.0–4.4)
Cholesterol, Total: 148 mg/dL (ref 100–199)
HDL: 59 mg/dL (ref >39)
LDL Chol Calc (NIH): 75 mg/dL (ref 0–99)
Triglycerides: 68 mg/dL (ref 0–149)
VLDL Cholesterol Cal: 14 mg/dL (ref 5–40)

## 2019-04-27 DIAGNOSIS — M25561 Pain in right knee: Secondary | ICD-10-CM | POA: Diagnosis not present

## 2019-04-27 DIAGNOSIS — M1711 Unilateral primary osteoarthritis, right knee: Secondary | ICD-10-CM | POA: Diagnosis not present

## 2019-04-27 DIAGNOSIS — M19011 Primary osteoarthritis, right shoulder: Secondary | ICD-10-CM | POA: Diagnosis not present

## 2019-04-27 DIAGNOSIS — M25511 Pain in right shoulder: Secondary | ICD-10-CM | POA: Diagnosis not present

## 2019-05-11 DIAGNOSIS — M25561 Pain in right knee: Secondary | ICD-10-CM | POA: Diagnosis not present

## 2019-05-11 DIAGNOSIS — M1711 Unilateral primary osteoarthritis, right knee: Secondary | ICD-10-CM | POA: Diagnosis not present

## 2019-05-11 DIAGNOSIS — M25511 Pain in right shoulder: Secondary | ICD-10-CM | POA: Diagnosis not present

## 2019-05-11 DIAGNOSIS — M19011 Primary osteoarthritis, right shoulder: Secondary | ICD-10-CM | POA: Diagnosis not present

## 2019-05-11 DIAGNOSIS — M9903 Segmental and somatic dysfunction of lumbar region: Secondary | ICD-10-CM | POA: Diagnosis not present

## 2019-05-15 DIAGNOSIS — M7130 Other bursal cyst, unspecified site: Secondary | ICD-10-CM | POA: Diagnosis not present

## 2019-06-01 DIAGNOSIS — M170 Bilateral primary osteoarthritis of knee: Secondary | ICD-10-CM | POA: Diagnosis not present

## 2019-06-13 ENCOUNTER — Other Ambulatory Visit: Payer: Self-pay | Admitting: Psychiatry

## 2019-06-13 DIAGNOSIS — F339 Major depressive disorder, recurrent, unspecified: Secondary | ICD-10-CM

## 2019-07-04 DIAGNOSIS — M170 Bilateral primary osteoarthritis of knee: Secondary | ICD-10-CM | POA: Diagnosis not present

## 2019-07-04 DIAGNOSIS — M25561 Pain in right knee: Secondary | ICD-10-CM | POA: Diagnosis not present

## 2019-07-06 ENCOUNTER — Other Ambulatory Visit: Payer: Self-pay

## 2019-07-06 ENCOUNTER — Encounter: Payer: Self-pay | Admitting: Family Medicine

## 2019-07-06 ENCOUNTER — Ambulatory Visit (INDEPENDENT_AMBULATORY_CARE_PROVIDER_SITE_OTHER): Payer: Medicare Other | Admitting: Family Medicine

## 2019-07-06 VITALS — BP 130/82 | HR 82 | Temp 96.2°F | Wt 174.0 lb

## 2019-07-06 DIAGNOSIS — H6122 Impacted cerumen, left ear: Secondary | ICD-10-CM

## 2019-07-06 NOTE — Progress Notes (Signed)
° °  Subjective:    Patient ID: Megan Nunez, female    DOB: [birth year], 74 y.o.   MRN: [id number]  HPI Chief Complaint  Patient presents with   other    ears flushed   She is here with complaints of decreased hearing and clogged sensation in her left ear.  Denies fever, chills, rhinorrhea, nasal congestion, postnasal drainage or sore throat.  He has no other concerns today.    Review of Systems Pertinent positives and negatives in the history of present illness.     Objective:   Physical Exam BP 130/82    Pulse 82    Temp (!) 96.2 F (35.7 C)    Wt 174 lb (78.9 kg)    SpO2 98%    BMI 33.98 kg/m   Alert and oriented and in no acute distress.  Right external ear, ear canal and TM normal.  Left ear canal with cerumen impaction prior to lavage.  Post lavage ear canal clear and a normal-appearing TM.  Hearing also improved significantly post lavage.      Assessment & Plan:  Impacted cerumen of left ear - Plan: Ear Lavage  Ear lavage performed by CMA, Kim, due to cerumen impaction of her left ear And patient tolerated this well.  She reported improvement in hearing and symptoms.

## 2019-07-09 ENCOUNTER — Encounter: Payer: Self-pay | Admitting: Family Medicine

## 2019-07-19 ENCOUNTER — Encounter: Payer: Self-pay | Admitting: Psychiatry

## 2019-07-19 ENCOUNTER — Ambulatory Visit (INDEPENDENT_AMBULATORY_CARE_PROVIDER_SITE_OTHER): Payer: Medicare Other | Admitting: Psychiatry

## 2019-07-19 ENCOUNTER — Other Ambulatory Visit: Payer: Self-pay

## 2019-07-19 DIAGNOSIS — F339 Major depressive disorder, recurrent, unspecified: Secondary | ICD-10-CM

## 2019-07-19 DIAGNOSIS — F900 Attention-deficit hyperactivity disorder, predominantly inattentive type: Secondary | ICD-10-CM | POA: Diagnosis not present

## 2019-07-19 MED ORDER — DULOXETINE HCL 60 MG PO CPEP: 60.0000 mg | ORAL_CAPSULE | Freq: Every morning | ORAL | 1 refills | Status: DC

## 2019-07-19 MED ORDER — DULOXETINE HCL 30 MG PO CPEP: ORAL_CAPSULE | ORAL | 0 refills | Status: DC

## 2019-07-19 MED ORDER — BUPROPION HCL ER (XL) 300 MG PO TB24: 300.0000 mg | ORAL_TABLET | Freq: Every day | ORAL | 1 refills | Status: DC

## 2019-07-19 MED ORDER — AMPHETAMINE-DEXTROAMPHET ER 15 MG PO CP24: 15.0000 mg | ORAL_CAPSULE | ORAL | 0 refills | Status: DC

## 2019-07-19 NOTE — Progress Notes (Signed)
Megan SloopKay E Coward [id number] 04/10/1945 74 y.o.  Subjective:   Patient ID:  Megan Nunez is a 74 y.o. (DOB 08/27/1945) female.  Chief Complaint:  Chief Complaint  Patient presents with  . Follow-up    ADD, h/o depression and anxiety    HPI Megan SloopKay E Monter presents to the office today for follow-up of ADD, anxiety, and depression. She reports that she feels "liberated" since receiving her covid vaccines. She reports that she continues to adjust to no longer being in quarantine. She reports occasional anxiety in response to this at various times. She reports that her "stresses are more explainable." She reports that she is not depressed "at the moment." She reports that at times she notices some increased energy and motivation and is not sure if this could change. She reports that she is not sleeping enough, "but fine." She reports that she prefers staying up late. She also notices she eats more at night when not hungry. She is working on regulating her sleep cycle. She reports that concentration has been adequate with Adderall. She reports that there is not a discrete time that the medication stops working. She reports that a friend helped her clean out her office some. She reports that she has been able to manage papers that have come in since that time. Has started making some lists. Denies SI.   Has been working on various projects. 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 She has set some limits with volunteer work.   Was recently able to see her family for the first time in over 1.5 years.  Past Psychiatric Medication Trials: Sertraline Prozac Cymbalta Wellbutrin XL Adderall Adderall XR  PHQ2-9     Office Visit from 07/06/2019 in [facility] Office Visit from 04/09/2019 in [facility] Office Visit from 01/03/2018 in [facility] Abstract from 11/16/2017 in [facility]  Office Visit from 12/08/2016 in [facility]  PHQ-2 Total Score 0 2 3 1  0  PHQ-9 Total Score -- 13 13 -- --       Review of Systems:  Review of Systems  Cardiovascular: Negative for palpitations.  Musculoskeletal: Negative for gait problem.       Improved knee pain after injection  Neurological: Negative for tremors.  Psychiatric/Behavioral:       Please refer to HPI  Larey SeatFell over the weekend in a hotel.   Medications: I have reviewed the patient's current medications.  Current Outpatient Medications  Medication Sig Dispense Refill  . acetaminophen (TYLENOL) 325 MG tablet Take 650 mg by mouth every 6 (six) hours as needed for mild pain.    Melene Muller. [START ON 09/13/2019] amphetamine-dextroamphetamine (ADDERALL XR) 15 MG 24 hr capsule Take 1 capsule by mouth every morning. 30 capsule 0  . [START ON 08/16/2019] amphetamine-dextroamphetamine (ADDERALL XR) 15 MG 24 hr capsule Take 1 capsule by mouth every morning. 30 capsule 0  . amphetamine-dextroamphetamine (ADDERALL XR) 15 MG 24 hr capsule Take 1 capsule by mouth every morning. 30 capsule 0  . Ascorbic Acid (VITAMIN C) 100 MG tablet Take 100 mg by mouth daily.    . Biotin (BIOTIN 5000) 5 MG CAPS Take 2 capsules by mouth daily.     Marland Kitchen. buPROPion (WELLBUTRIN XL) 300 MG 24 hr tablet Take 1 tablet (300 mg total) by mouth daily. 90 tablet 1  . cholecalciferol (VITAMIN D3) 25 MCG (1000 UNIT) tablet Take 1,000  Units by mouth daily.    . diclofenac Sodium (VOLTAREN) 1 % GEL Apply 1 application topically 4 (four) times daily.    . DULoxetine (CYMBALTA) 30 MG capsule TAKE ONE CAPSULE BY MOUTH DAILY TAKE WITH 60 MG 90 capsule 0  . DULoxetine (CYMBALTA) 60 MG capsule Take 1 capsule (60 mg total) by mouth every morning. 90 capsule 1  . Glucosamine-Chondroitin 750-600 MG CHEW Chew 2 tablets by mouth daily.    . Multiple Vitamins-Minerals (MULTIVITAMIN WITH MINERALS) tablet Take 1 tablet by mouth daily.    . naproxen (NAPROSYN) 375 MG tablet Take 375  mg by mouth 2 (two) times daily with a meal.    . Turmeric Curcumin 500 MG CAPS Take 1 capsule by mouth daily.     . vitamin B-12 (CYANOCOBALAMIN) 1000 MCG tablet Take 1,000 mcg by mouth daily.     Current Facility-Administered Medications  Medication Dose Route Frequency Provider Last Rate Last Admin  . methylPREDNISolone acetate (DEPO-MEDROL) injection 40 mg  40 mg Intra-articular Once Hilts, Michael, MD        Medication Side Effects: None  Allergies: No Known Allergies  Past Medical History:  Diagnosis Date  . Arthritis   . Depression   . Obesity     Family History  Problem Relation Age of Onset  . Cancer Brother 50       pancreatic cancer  . Alcohol abuse Brother   . Depression Brother   . Asthma Paternal Grandmother   . Heart disease Mother        Mitral valve disease.  . Dementia Mother   . Lung disease Father        Idiopathic pulmonary fibrosis  . Heart disease Maternal Grandmother        Died after complications of angioplasty.  . Depression Sister   . Alcohol abuse Sister   . Alcohol abuse Brother   . Depression Brother   . Alcohol abuse Brother   . Depression Cousin   . Depression Other     Social History   Socioeconomic History  . Marital status: Married    Spouse name: Not on file  . Number of children: Not on file  . Years of education: Not on file  . Highest education level: Not on file  Occupational History  . Not on file  Tobacco Use  . Smoking status: Never Smoker  . Smokeless tobacco: Never Used  Substance and Sexual Activity  . Alcohol use: Yes    Alcohol/week: 1.0 standard drink    Types: 1 Glasses of wine per week  . Drug use: No  . Sexual activity: Not Currently  Other Topics Concern  . Not on file  Social History Narrative   She is married to another patient of [facility]. They have been married for 20  years.  No children. 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   He never smoked, and  drinks maybe 1-3 glass of wine a day.   He has recently started exercising with her husband who has completed cardiac rehabilitation and is in the maintenance program. They usually walks about 2-3 days a week for 30-45 minutes.   Social Determinants of Health   Financial Resource Strain:   . Difficulty of Paying Living Expenses:   Food Insecurity:   . Worried About Programme researcher, broadcasting/film/video in the Last Year:   . Barista in the Last Year:   [employer]  Needs:   . Lack of Transportation (Medical):   Marland Kitchen Lack of Transportation (Non-Medical):   Physical Activity:   . Days of Exercise per Week:   . Minutes of Exercise per Session:   Stress:   . Feeling of Stress :   Social Connections:   . Frequency of Communication with Friends and Family:   . Frequency of Social Gatherings with Friends and Family:   . Attends Religious Services:   . Active Member of Clubs or Organizations:   . Attends Banker Meetings:   Marland Kitchen Marital Status:   Intimate Partner Violence:   . Fear of Current or Ex-Partner:   . Emotionally Abused:   Marland Kitchen Physically Abused:   . Sexually Abused:     Past Medical History, Surgical history, Social history, and Family history were reviewed and updated as appropriate.   Please see review of systems for further details on the patient's review from today.   Objective:   Physical Exam:  BP (!) 149/88   Pulse 73   Physical Exam Constitutional:      General: She is not in acute distress. Musculoskeletal:        General: No deformity.  Neurological:     Mental Status: She is alert and oriented to person, place, and time.     Coordination: Coordination normal.  Psychiatric:        Attention and Perception: Attention and perception normal. She does not perceive auditory or visual hallucinations.        Mood and Affect: Mood normal. Mood is not anxious or depressed. Affect is not labile, blunt, angry or inappropriate.        Speech: Speech normal.         Behavior: Behavior normal.        Thought Content: Thought content normal. Thought content is not paranoid or delusional. Thought content does not include homicidal or suicidal ideation. Thought content does not include homicidal or suicidal plan.        Cognition and Memory: Cognition and memory normal.        Judgment: Judgment normal.     Comments: Insight intact     Lab Review:     Component Value Date/Time   NA 144 04/09/2019 1239   K 5.2 04/09/2019 1239   CL 106 04/09/2019 1239   CO2 18 (L) 04/09/2019 1239   GLUCOSE 102 (H) 04/09/2019 1239   GLUCOSE 95 12/08/2016 1030   BUN 17 04/09/2019 1239   CREATININE 0.78 04/09/2019 1239   CREATININE 0.63 12/08/2016 1030   CALCIUM 9.3 04/09/2019 1239   PROT 7.0 04/09/2019 1239   ALBUMIN 4.5 04/09/2019 1239   AST 23 04/09/2019 1239   ALT 14 04/09/2019 1239   ALKPHOS 115 04/09/2019 1239   BILITOT 0.2 04/09/2019 1239   GFRNONAA 77 04/09/2019 1239   GFRAA 88 04/09/2019 1239       Component Value Date/Time   WBC 9.2 04/09/2019 1239   WBC 7.1 12/08/2016 1030   RBC 4.86 04/09/2019 1239   RBC 4.86 12/08/2016 1030   HGB 14.7 04/09/2019 1239   HCT 43.2 04/09/2019 1239   PLT 224 04/09/2019 1239   MCV 89 04/09/2019 1239   MCH 30.2 04/09/2019 1239   MCH 30.0 12/08/2016 1030   MCHC 34.0 04/09/2019 1239   MCHC 34.7 12/08/2016 1030   RDW 12.8 04/09/2019 1239   LYMPHSABS 2.1 04/09/2019 1239   MONOABS 546 12/25/2015 1405   EOSABS 0.4 04/09/2019 1239   BASOSABS  0.1 04/09/2019 1239    No results found for: POCLITH, LITHIUM   No results found for: PHENYTOIN, PHENOBARB, VALPROATE, CBMZ   .res Assessment: Plan:   Will continue Adderall XR 15 mg daily for attention deficit disorder.  Encourage patient to continue to monitor blood pressure and to talk with PCP if blood pressure remains slightly elevated. Continue Wellbutrin XL 300 mg daily for depression. Continue Cymbalta 90 mg daily for depression and anxiety. Patient to follow-up in  3 months or sooner if clinically indicated. Patient advised to contact office with any questions, adverse effects, or acute worsening in signs and symptoms.  Corisa was seen today for follow-up.  Diagnoses and all orders for this visit:  Major depression, recurrent, chronic (HCC) -     buPROPion (WELLBUTRIN XL) 300 MG 24 hr tablet; Take 1 tablet (300 mg total) by mouth daily. -     DULoxetine (CYMBALTA) 30 MG capsule; TAKE ONE CAPSULE BY MOUTH DAILY TAKE WITH 60 MG -     DULoxetine (CYMBALTA) 60 MG capsule; Take 1 capsule (60 mg total) by mouth every morning.  Attention deficit hyperactivity disorder (ADHD), predominantly inattentive type -     amphetamine-dextroamphetamine (ADDERALL XR) 15 MG 24 hr capsule; Take 1 capsule by mouth every morning. -     amphetamine-dextroamphetamine (ADDERALL XR) 15 MG 24 hr capsule; Take 1 capsule by mouth every morning. -     amphetamine-dextroamphetamine (ADDERALL XR) 15 MG 24 hr capsule; Take 1 capsule by mouth every morning.     Please see After Visit Summary for patient specific instructions.  Future Appointments  Date Time Provider Department Center  10/15/2019 10:30 AM Corie Chiquito, PMHNP [facility] None  04/10/2020  8:30 AM Ronnald Nian, MD [facility]    No orders of the defined types were placed in this encounter.   -------------------------------

## 2019-07-26 DIAGNOSIS — Z1231 Encounter for screening mammogram for malignant neoplasm of breast: Secondary | ICD-10-CM | POA: Diagnosis not present

## 2019-07-26 LAB — HM MAMMOGRAPHY

## 2019-08-14 ENCOUNTER — Encounter: Payer: Self-pay | Admitting: Family Medicine

## 2019-08-22 DIAGNOSIS — H43313 Vitreous membranes and strands, bilateral: Secondary | ICD-10-CM | POA: Diagnosis not present

## 2019-08-22 DIAGNOSIS — H2510 Age-related nuclear cataract, unspecified eye: Secondary | ICD-10-CM | POA: Diagnosis not present

## 2019-08-27 DIAGNOSIS — M170 Bilateral primary osteoarthritis of knee: Secondary | ICD-10-CM | POA: Diagnosis not present

## 2019-10-04 ENCOUNTER — Other Ambulatory Visit: Payer: Self-pay | Admitting: Sports Medicine

## 2019-10-04 DIAGNOSIS — Z13820 Encounter for screening for osteoporosis: Secondary | ICD-10-CM

## 2019-10-15 ENCOUNTER — Ambulatory Visit (INDEPENDENT_AMBULATORY_CARE_PROVIDER_SITE_OTHER): Payer: Medicare Other | Admitting: Psychiatry

## 2019-10-15 ENCOUNTER — Encounter: Payer: Self-pay | Admitting: Psychiatry

## 2019-10-15 ENCOUNTER — Other Ambulatory Visit: Payer: Self-pay

## 2019-10-15 VITALS — BP 127/73 | HR 79 | Wt 175.0 lb

## 2019-10-15 DIAGNOSIS — F900 Attention-deficit hyperactivity disorder, predominantly inattentive type: Secondary | ICD-10-CM

## 2019-10-15 DIAGNOSIS — F339 Major depressive disorder, recurrent, unspecified: Secondary | ICD-10-CM

## 2019-10-15 DIAGNOSIS — F5081 Binge eating disorder: Secondary | ICD-10-CM | POA: Diagnosis not present

## 2019-10-15 MED ORDER — DULOXETINE HCL 60 MG PO CPEP: 60.0000 mg | ORAL_CAPSULE | Freq: Every morning | ORAL | 1 refills | Status: DC

## 2019-10-15 MED ORDER — DULOXETINE HCL 30 MG PO CPEP: ORAL_CAPSULE | ORAL | 0 refills | Status: DC

## 2019-10-15 MED ORDER — LISDEXAMFETAMINE DIMESYLATE 40 MG PO CAPS: 40.0000 mg | ORAL_CAPSULE | ORAL | 0 refills | Status: DC

## 2019-10-15 NOTE — Progress Notes (Signed)
Megan Nunez [id number] 21-Oct-1945 74 y.o.  Subjective:   Patient ID:  Megan Nunez is a 74 y.o. (DOB [birth year]) female.  Chief Complaint:  Chief Complaint  Patient presents with  . Depression  . Anxiety  . ADD    HPI Megan LICKLIDER presents to the office today for follow-up of depression, anxiety, and ADD. She reports that she missed a turn on the way to her apt today and that she seems to have more difficulty navigating when she is upset, even when driving to familiar locations.  She reports long-standing procrastination. She reports that she has been researching ADD and identifies with some of these s/s. She reports that she involuntarily interrupts others and this has been a long-standing issue with her and her husband. She reports that husband now copes with this more easily. She reports that she will start tasks and not complete them. Reports that ADD s/s "kept me timid and insecure." Has been avoiding making lists since she is concerned she will feel overwhelmed. She reports that Adderall XR seems to last 4-6 hours. She reports that adderall XR is partially effective during those times.  "My emotions are closer to the surface." She reports that she has some low level depression. She reports that she has some generalized anxiety and will feel overwhelmed by procrastination. Appetite has been good and reports that she is currently at her highest weight. She reports that she is occasionally binge eating when she is awake later at night. Energy and motivation are mediocre. Denies SI.   She reports that she and her husband are continuing to see Megan Nunez, [facility].   Past Psychiatric Medication Trials: Sertraline Prozac Cymbalta Wellbutrin XL Adderall Adderall XR  PHQ2-9     Office Visit from 07/06/2019 in [facility] Office Visit from 04/09/2019 in [facility] Office Visit from 01/03/2018 in [facility] Abstract from 11/16/2017 in [facility] Visit from 12/08/2016 in [facility]  PHQ-2 Total Score 0 2 3 1  0  PHQ-9 Total Score -- 13 13 -- --       Review of Systems:  Review of Systems  HENT: Positive for voice change.   Cardiovascular: Negative for palpitations.  Musculoskeletal: Negative for gait problem.       Knee pain  Neurological: Negative for tremors.  Psychiatric/Behavioral:       Please refer to HPI  Reports that she has been falling recently- "I seem to be getting clumsier." Denies falls due to dizziness.   Medications: I have reviewed the patient's current medications.  Current Outpatient Medications  Medication Sig Dispense Refill  . acetaminophen (TYLENOL) 325 MG tablet Take 650 mg by mouth every 6 (six) hours as needed for mild pain.    . Ascorbic Acid (VITAMIN C) 100 MG tablet Take 100 mg by mouth daily.    . Biotin (BIOTIN 5000) 5 MG CAPS Take 2 capsules by mouth daily.     buPROPion (WELLBUTRIN XL) 300 MG 24 hr tablet Take 1 tablet (300 mg total) by mouth daily. 90 tablet 1  . cholecalciferol (VITAMIN D3) 25 MCG (1000 UNIT) tablet Take 1,000 Units by mouth daily.    . diclofenac Sodium (VOLTAREN) 1 % GEL Apply 1 application topically 4 (four) times daily.    . DULoxetine (CYMBALTA) 30 MG capsule TAKE ONE CAPSULE BY MOUTH DAILY TAKE WITH 60 MG 90 capsule 0  . DULoxetine (CYMBALTA) 60 MG capsule Take 1 capsule (60 mg  total) by mouth every morning. 90 capsule 1  . Glucosamine-Chondroitin 750-600 MG CHEW Chew 2 tablets by mouth daily.    Marland Kitchen lisdexamfetamine (VYVANSE) 40 MG capsule Take 1 capsule (40 mg total) by mouth every morning. 30 capsule 0  . Multiple Vitamins-Minerals (MULTIVITAMIN WITH MINERALS) tablet Take 1 tablet by mouth daily.    . naproxen (NAPROSYN) 375 MG tablet Take 375 mg by mouth 2 (two) times daily with a meal.    . Turmeric Curcumin 500 MG CAPS Take 1 capsule by mouth daily.     . vitamin B-12 (CYANOCOBALAMIN) 1000 MCG tablet Take 1,000 mcg by mouth daily.      Current Facility-Administered Medications  Medication Dose Route Frequency Provider Last Rate Last Admin  . methylPREDNISolone acetate (DEPO-MEDROL) injection 40 mg  40 mg Intra-articular Once Hilts, Michael, MD        Medication Side Effects: None  Allergies: No Known Allergies  Past Medical History:  Diagnosis Date  . Arthritis   . Depression   . Obesity     Family History  Problem Relation Age of Onset  . Cancer Brother 50       pancreatic cancer  . Alcohol abuse Brother   . Depression Brother   . Asthma Paternal Grandmother   . Heart disease Mother        Mitral valve disease.  . Dementia Mother   . Lung disease Father        Idiopathic pulmonary fibrosis  . Heart disease Maternal Grandmother        Died after complications of angioplasty.  . Depression Sister   . Alcohol abuse Sister   . Alcohol abuse Brother   . Depression Brother   . Alcohol abuse Brother   . Depression Cousin   . Depression Other     Social History   Socioeconomic History  . Marital status: Married    Spouse name: Not on file  . Number of children: Not on file  . Years of education: Not on file  . Highest education level: Not on file  Occupational History  . Not on file  Tobacco Use  . Smoking status: Never Smoker  . Smokeless tobacco: Never Used  Substance and Sexual Activity  . Alcohol use: Yes    Alcohol/week: 1.0 standard drink    Types: 1 Glasses of wine per week  . Drug use: No  . Sexual activity: Not Currently  Other Topics Concern  . Not on file  Social History Narrative   She is married to another patient of [facility]. They have been married for 20  years.  No children. 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   He never smoked, and drinks maybe 1-3 glass of wine a day.   He has recently started exercising with her husband who has completed cardiac rehabilitation and is in the maintenance program. They usually walks about 2-3  days a week for 30-45 minutes.   Social Determinants of Health   Financial Resource Strain:   . Difficulty of Paying Living Expenses: Not on file  Food Insecurity:   . Worried About Programme researcher, broadcasting/film/video in the Last Year: Not on file  . Ran Out of Food in the Last Year: Not on file  Transportation Needs:   . Lack of Transportation (Medical): Not on file  . Lack of Transportation (Non-Medical): Not on file  Physical Activity:   . Days of Exercise per  Week: Not on file  . Minutes of Exercise per Session: Not on file  Stress:   . Feeling of Stress : Not on file  Social Connections:   . Frequency of Communication with Friends and Family: Not on file  . Frequency of Social Gatherings with Friends and Family: Not on file  . Attends Religious Services: Not on file  . Active Member of Clubs or Organizations: Not on file  . Attends Banker Meetings: Not on file  . Marital Status: Not on file  Intimate Partner Violence:   . Fear of Current or Ex-Partner: Not on file  . Emotionally Abused: Not on file  . Physically Abused: Not on file  . Sexually Abused: Not on file    Past Medical History, Surgical history, Social history, and Family history were reviewed and updated as appropriate.   Please see review of systems for further details on the patient's review from today.   Objective:   Physical Exam:  BP 127/73   Pulse 79   Wt 175 lb (79.4 kg)   BMI 34.18 kg/m   Physical Exam Constitutional:      General: She is not in acute distress. Musculoskeletal:        General: No deformity.  Neurological:     Mental Status: She is alert and oriented to person, place, and time.     Coordination: Coordination normal.  Psychiatric:        Attention and Perception: Attention and perception normal. She does not perceive auditory or visual hallucinations.        Mood and Affect: Affect is not labile, angry or inappropriate.        Speech: Speech normal.        Behavior: Behavior  normal.        Thought Content: Thought content normal. Thought content is not paranoid or delusional. Thought content does not include homicidal or suicidal ideation. Thought content does not include homicidal or suicidal plan.        Cognition and Memory: Cognition and memory normal.        Judgment: Judgment normal.     Comments: Insight intact Mood is appropriate to content. Affect is congruent.      Lab Review:     Component Value Date/Time   NA 144 04/09/2019 1239   K 5.2 04/09/2019 1239   CL 106 04/09/2019 1239   CO2 18 (L) 04/09/2019 1239   GLUCOSE 102 (H) 04/09/2019 1239   GLUCOSE 95 12/08/2016 1030   BUN 17 04/09/2019 1239   CREATININE 0.78 04/09/2019 1239   CREATININE 0.63 12/08/2016 1030   CALCIUM 9.3 04/09/2019 1239   PROT 7.0 04/09/2019 1239   ALBUMIN 4.5 04/09/2019 1239   AST 23 04/09/2019 1239   ALT 14 04/09/2019 1239   ALKPHOS 115 04/09/2019 1239   BILITOT 0.2 04/09/2019 1239   GFRNONAA 77 04/09/2019 1239   GFRAA 88 04/09/2019 1239       Component Value Date/Time   WBC 9.2 04/09/2019 1239   WBC 7.1 12/08/2016 1030   RBC 4.86 04/09/2019 1239   RBC 4.86 12/08/2016 1030   HGB 14.7 04/09/2019 1239   HCT 43.2 04/09/2019 1239   PLT 224 04/09/2019 1239   MCV 89 04/09/2019 1239   MCH 30.2 04/09/2019 1239   MCH 30.0 12/08/2016 1030   MCHC 34.0 04/09/2019 1239   MCHC 34.7 12/08/2016 1030   RDW 12.8 04/09/2019 1239   LYMPHSABS 2.1 04/09/2019 1239   MONOABS 546  12/25/2015 1405   EOSABS 0.4 04/09/2019 1239   BASOSABS 0.1 04/09/2019 1239    No results found for: POCLITH, LITHIUM   No results found for: PHENYTOIN, PHENOBARB, VALPROATE, CBMZ   .res Assessment: Plan:   Will discontinue Adderall XR due to limited response and brief duration.  Discussed potential benefits, risks, and side effects of Vyvanse and that this may offer a longer duration compared to Adderall XR. Discussed that Vyvanse also has an indication for binge eating disorder and may help  improve binge eating.  Will start Vyvanse 40 mg po qd for ADD. Consider Methylphenidate ER 20 mg daily if Vyvanse is not covered or is cost-prohibitive. Continue Cymbalta 90 mg po qd for depression and anxiety.  Continue Wellbutrin XL 300 mg po qd for depression. Recommend continuing therapy with Megan Dallas[facility]ald Nune[facility], [facility]. Pt to follow-up in 4 weeks or sooner if clinically indicated.  Patient advised to contact office with any questions, adverse effects, or acute worsening in signs and symptoms.  Megan GrossKay was seen today for depression, anxiety and add.  Diagnoses and all orders for this visit:  Attention deficit hyperactivity disorder (ADHD), predominantly inattentive type -     lisdexamfetamine (VYVANSE) 40 MG capsule; Take 1 capsule (40 mg total) by mouth every morning.  Major depression, recurrent, chronic (HCC) -     DULoxetine (CYMBALTA) 30 MG capsule; TAKE ONE CAPSULE BY MOUTH DAILY TAKE WITH 60 MG -     DULoxetine (CYMBALTA) 60 MG capsule; Take 1 capsule (60 mg total) by mouth every morning.  Binge eating disorder -     lisdexamfetamine (VYVANSE) 40 MG capsule; Take 1 capsule (40 mg total) by mouth every morning.     Please see After Visit Summary for patient specific instructions.  Future Appointments  Date Time Provider Department Center  11/15/2019 11:15 AM Corie Chiquitoarter, Selby Slovacek, PMHNP CP-CP None  04/10/2020  8:30 AM Ronnald NianLalonde, John C, MD [facility]    No orders of the defined types were placed in this encounter.   -------------------------------

## 2019-10-18 ENCOUNTER — Encounter: Payer: Self-pay | Admitting: Family Medicine

## 2019-10-19 ENCOUNTER — Ambulatory Visit
Admission: RE | Admit: 2019-10-19 | Discharge: 2019-10-19 | Disposition: A | Discharge status: Home / Self Care | Payer: Medicare Other | Source: Ambulatory Visit | Attending: Sports Medicine | Admitting: Sports Medicine

## 2019-10-19 ENCOUNTER — Other Ambulatory Visit: Payer: Self-pay

## 2019-10-19 DIAGNOSIS — Z780 Asymptomatic menopausal state: Secondary | ICD-10-CM | POA: Diagnosis not present

## 2019-10-19 DIAGNOSIS — Z13820 Encounter for screening for osteoporosis: Secondary | ICD-10-CM

## 2019-11-15 ENCOUNTER — Telehealth (INDEPENDENT_AMBULATORY_CARE_PROVIDER_SITE_OTHER): Payer: Medicare Other | Admitting: Psychiatry

## 2019-11-15 ENCOUNTER — Encounter: Payer: Self-pay | Admitting: Psychiatry

## 2019-11-15 DIAGNOSIS — F900 Attention-deficit hyperactivity disorder, predominantly inattentive type: Secondary | ICD-10-CM | POA: Diagnosis not present

## 2019-11-15 DIAGNOSIS — F5081 Binge eating disorder: Secondary | ICD-10-CM

## 2019-11-15 DIAGNOSIS — F339 Major depressive disorder, recurrent, unspecified: Secondary | ICD-10-CM | POA: Diagnosis not present

## 2019-11-15 MED ORDER — DULOXETINE HCL 30 MG PO CPEP: ORAL_CAPSULE | ORAL | 0 refills | Status: DC

## 2019-11-15 MED ORDER — LISDEXAMFETAMINE DIMESYLATE 40 MG PO CAPS: 40.0000 mg | ORAL_CAPSULE | ORAL | 0 refills | Status: DC

## 2019-11-15 MED ORDER — BUPROPION HCL ER (XL) 300 MG PO TB24: 300.0000 mg | ORAL_TABLET | Freq: Every day | ORAL | 1 refills | Status: DC

## 2019-11-15 MED ORDER — DULOXETINE HCL 60 MG PO CPEP: 60.0000 mg | ORAL_CAPSULE | Freq: Every morning | ORAL | 1 refills | Status: DC

## 2019-11-15 NOTE — Progress Notes (Signed)
Megan Nunez [id number] Jan 25, 1945 74 y.o.  Virtual Visit via Telephone Note  I connected with pt on 11/15/19 at 11:15 AM EDT by telephone and verified that I am speaking with the correct person using two identifiers.   I discussed the limitations, risks, security and privacy concerns of performing an evaluation and management service by telephone and the availability of in person appointments. I also discussed with the patient that there may be a patient responsible charge related to this service. The patient expressed understanding and agreed to proceed.   I discussed the assessment and treatment plan with the patient. The patient was provided an opportunity to ask questions and all were answered. The patient agreed with the plan and demonstrated an understanding of the instructions.   The patient was advised to call back or seek an in-person evaluation if the symptoms worsen or if the condition fails to improve as anticipated.  I provided 30 minutes of non-face-to-face time during this encounter.  The patient was located at home.  The provider was located at [facility].   Corie Chiquito, PMHNP   Subjective:   Patient ID:  Megan Nunez is a 74 y.o. (DOB 19-Sep-1945) female.  Chief Complaint:  Chief Complaint  Patient presents with  . Follow-up    ADD, Depression, and anxiety    HPI KILEE HEDDING presents for follow-up of ADD, Anxiety, and Depression. She reports, "I am doing much, much better... the Vyvanse is amazing." She reports that she now is able to focus for several hours without interruption. She reports that she is accomplishing more. She reports that she is "blurting things out" and interrupting less which is helpful in her relationship with her husband. She reports that she has been able to better facilitate group through her church.   She reports that she has been feeling "calm." She reports that she is concerned that her husband is significantly depressed. She  denies any other anxiety. She reports that her sleep has been disturbed at times this past month and reports that it could be related to different factors. She reports that she has been staying up late since it is the only time she has to herself. She reports that she just slept 10 hours last night. Appetite has been "too good." She reports that she tends to eat more when she stays up late. She reports some improvement in binge eating. She reports that she has experienced some depression a couple of days. She reports that she has not been getting outside or walking as much. "Everything seems possible to me now." Denies SI.   Past Psychiatric Medication Trials: Sertraline Prozac Cymbalta Wellbutrin XL Adderall Adderall XR  Review of Systems:  Review of Systems  Cardiovascular: Negative for palpitations and leg swelling.  Musculoskeletal: Negative for gait problem.  Psychiatric/Behavioral:       Please refer to HPI    Medications: I have reviewed the patient's current medications.  Current Outpatient Medications  Medication Sig Dispense Refill  . acetaminophen (TYLENOL) 325 MG tablet Take 650 mg by mouth every 6 (six) hours as needed for mild pain.    . Ascorbic Acid (VITAMIN C) 100 MG tablet Take 100 mg by mouth daily.    . Biotin (BIOTIN 5000) 5 MG CAPS Take 2 capsules by mouth daily.     Marland Kitchen buPROPion (WELLBUTRIN XL) 300 MG 24 hr tablet Take 1 tablet (300 mg total) by mouth daily. 90 tablet 1  . cholecalciferol (VITAMIN D3) 25 MCG (  1000 UNIT) tablet Take 1,000 Units by mouth daily.    . diclofenac Sodium (VOLTAREN) 1 % GEL Apply 1 application topically 4 (four) times daily.    . DULoxetine (CYMBALTA) 30 MG capsule TAKE ONE CAPSULE BY MOUTH DAILY TAKE WITH 60 MG 90 capsule 0  . DULoxetine (CYMBALTA) 60 MG capsule Take 1 capsule (60 mg total) by mouth every morning. 90 capsule 1  . Glucosamine-Chondroitin 750-600 MG CHEW Chew 2 tablets by mouth daily.    Melene Muller ON 01/10/2020]  lisdexamfetamine (VYVANSE) 40 MG capsule Take 1 capsule (40 mg total) by mouth every morning. 30 capsule 0  . Multiple Vitamins-Minerals (MULTIVITAMIN WITH MINERALS) tablet Take 1 tablet by mouth daily.    . naproxen (NAPROSYN) 375 MG tablet Take 375 mg by mouth 2 (two) times daily with a meal.    . Turmeric Curcumin 500 MG CAPS Take 1 capsule by mouth daily.     . vitamin B-12 (CYANOCOBALAMIN) 1000 MCG tablet Take 1,000 mcg by mouth daily.    Marland Kitchen lisdexamfetamine (VYVANSE) 40 MG capsule Take 1 capsule (40 mg total) by mouth every morning. 30 capsule 0  . [START ON 12/13/2019] lisdexamfetamine (VYVANSE) 40 MG capsule Take 1 capsule (40 mg total) by mouth every morning. 30 capsule 0   Current Facility-Administered Medications  Medication Dose Route Frequency Provider Last Rate Last Admin  . methylPREDNISolone acetate (DEPO-MEDROL) injection 40 mg  40 mg Intra-articular Once Hilts, Michael, MD        Medication Side Effects: None  Allergies: No Known Allergies  Past Medical History:  Diagnosis Date  . Arthritis   . Depression   . Obesity     Family History  Problem Relation Age of Onset  . Cancer Brother 50       pancreatic cancer  . Alcohol abuse Brother   . Depression Brother   . Asthma Paternal Grandmother   . Heart disease Mother        Mitral valve disease.  . Dementia Mother   . Lung disease Father        Idiopathic pulmonary fibrosis  . Heart disease Maternal Grandmother        Died after complications of angioplasty.  . Depression Sister   . Alcohol abuse Sister   . Alcohol abuse Brother   . Depression Brother   . Alcohol abuse Brother   . Depression Cousin   . Depression Other     Social History   Socioeconomic History  . Marital status: Married    Spouse name: Not on file  . Number of children: Not on file  . Years of education: Not on file  . Highest education level: Not on file  Occupational History  . Not on file  Tobacco Use  . Smoking status: Never  Smoker  . Smokeless tobacco: Never Used  Substance and Sexual Activity  . Alcohol use: Yes    Alcohol/week: 1.0 standard drink    Types: 1 Glasses of wine per week  . Drug use: No  . Sexual activity: Not Currently  Other Topics Concern  . Not on file  Social History Narrative   She is married to another patient of [facility]. They have been married for 20  years.  No children. 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   He never smoked, and drinks maybe 1-3 glass of wine a day.   He has recently started exercising with her husband who  has completed cardiac rehabilitation and is in the maintenance program. They usually walks about 2-3 days a week for 30-45 minutes.   Social Determinants of Health   Financial Resource Strain:   . Difficulty of Paying Living Expenses: Not on file  Food Insecurity:   . Worried About Programme researcher, broadcasting/film/videounning Out of Food in the Last Year: Not on file  . Ran Out of Food in the Last Year: Not on file  Transportation Needs:   . Lack of Transportation (Medical): Not on file  . Lack of Transportation (Non-Medical): Not on file  Physical Activity:   . Days of Exercise per Week: Not on file  . Minutes of Exercise per Session: Not on file  Stress:   . Feeling of Stress : Not on file  Social Connections:   . Frequency of Communication with Friends and Family: Not on file  . Frequency of Social Gatherings with Friends and Family: Not on file  . Attends Religious Services: Not on file  . Active Member of Clubs or Organizations: Not on file  . Attends BankerClub or Organization Meetings: Not on file  . Marital Status: Not on file  Intimate Partner Violence:   . Fear of Current or Ex-Partner: Not on file  . Emotionally Abused: Not on file  . Physically Abused: Not on file  . Sexually Abused: Not on file    Past Medical History, Surgical history, Social history, and Family history were reviewed and updated as appropriate.   Please see  review of systems for further details on the patient's review from today.   Objective:   Physical Exam:  There were no vitals taken for this visit.  Physical Exam Neurological:     Mental Status: She is alert and oriented to person, place, and time.     Cranial Nerves: No dysarthria.  Psychiatric:        Attention and Perception: Attention and perception normal.        Mood and Affect: Mood normal.        Speech: Speech normal.        Behavior: Behavior is cooperative.        Thought Content: Thought content normal. Thought content is not paranoid or delusional. Thought content does not include homicidal or suicidal ideation. Thought content does not include homicidal or suicidal plan.        Cognition and Memory: Cognition and memory normal.        Judgment: Judgment normal.     Comments: Insight intact     Lab Review:     Component Value Date/Time   NA 144 04/09/2019 1239   K 5.2 04/09/2019 1239   CL 106 04/09/2019 1239   CO2 18 (L) 04/09/2019 1239   GLUCOSE 102 (H) 04/09/2019 1239   GLUCOSE 95 12/08/2016 1030   BUN 17 04/09/2019 1239   CREATININE 0.78 04/09/2019 1239   CREATININE 0.63 12/08/2016 1030   CALCIUM 9.3 04/09/2019 1239   PROT 7.0 04/09/2019 1239   ALBUMIN 4.5 04/09/2019 1239   AST 23 04/09/2019 1239   ALT 14 04/09/2019 1239   ALKPHOS 115 04/09/2019 1239   BILITOT 0.2 04/09/2019 1239   GFRNONAA 77 04/09/2019 1239   GFRAA 88 04/09/2019 1239       Component Value Date/Time   WBC 9.2 04/09/2019 1239   WBC 7.1 12/08/2016 1030   RBC 4.86 04/09/2019 1239   RBC 4.86 12/08/2016 1030   HGB 14.7 04/09/2019 1239   HCT 43.2 04/09/2019 1239  PLT 224 04/09/2019 1239   MCV 89 04/09/2019 1239   MCH 30.2 04/09/2019 1239   MCH 30.0 12/08/2016 1030   MCHC 34.0 04/09/2019 1239   MCHC 34.7 12/08/2016 1030   RDW 12.8 04/09/2019 1239   LYMPHSABS 2.1 04/09/2019 1239   MONOABS 546 12/25/2015 1405   EOSABS 0.4 04/09/2019 1239   BASOSABS 0.1 04/09/2019 1239    No  results found for: POCLITH, LITHIUM   No results found for: PHENYTOIN, PHENOBARB, VALPROATE, CBMZ   .res Assessment: Plan:   Will continue current plan of care since target signs and symptoms are well controlled without any tolerability issues. Continue Vyvanse 40 mg po qd for ADHD.  Continue Cymbalta 90 mg po qd for depression and anxiety.  Continue Wellbutrin XL 300 mg po qd for depression.  Recommend continuing therapy with Bennie Dallas, [place].  Pt to follow-up in 3 months or sooner if clinically indicated.  Patient advised to contact office with any questions, adverse effects, or acute worsening in signs and symptoms.  Christabella was seen today for follow-up.  Diagnoses and all orders for this visit:  Attention deficit hyperactivity disorder (ADHD), predominantly inattentive type -     lisdexamfetamine (VYVANSE) 40 MG capsule; Take 1 capsule (40 mg total) by mouth every morning. -     lisdexamfetamine (VYVANSE) 40 MG capsule; Take 1 capsule (40 mg total) by mouth every morning. -     lisdexamfetamine (VYVANSE) 40 MG capsule; Take 1 capsule (40 mg total) by mouth every morning.  Binge eating disorder -     lisdexamfetamine (VYVANSE) 40 MG capsule; Take 1 capsule (40 mg total) by mouth every morning.  Major depression, recurrent, chronic (HCC) -     DULoxetine (CYMBALTA) 30 MG capsule; TAKE ONE CAPSULE BY MOUTH DAILY TAKE WITH 60 MG -     DULoxetine (CYMBALTA) 60 MG capsule; Take 1 capsule (60 mg total) by mouth every morning. -     buPROPion (WELLBUTRIN XL) 300 MG 24 hr tablet; Take 1 tablet (300 mg total) by mouth daily.    Please see After Visit Summary for patient specific instructions.  Future Appointments  Date Time Provider Department Center  02/15/2020 11:00 AM Corie Chiquito, PMHNP [facility] None  04/10/2020  8:30 AM Ronnald Nian, MD [facility]    No orders of the defined types were placed in this encounter.     -------------------------------

## 2019-11-16 DIAGNOSIS — M25561 Pain in right knee: Secondary | ICD-10-CM | POA: Diagnosis not present

## 2019-11-16 DIAGNOSIS — M25461 Effusion, right knee: Secondary | ICD-10-CM | POA: Diagnosis not present

## 2019-11-16 DIAGNOSIS — M170 Bilateral primary osteoarthritis of knee: Secondary | ICD-10-CM | POA: Diagnosis not present

## 2019-11-28 ENCOUNTER — Encounter: Payer: Self-pay | Admitting: Family Medicine

## 2019-12-12 DIAGNOSIS — M1711 Unilateral primary osteoarthritis, right knee: Secondary | ICD-10-CM | POA: Diagnosis not present

## 2020-01-01 ENCOUNTER — Other Ambulatory Visit: Payer: Self-pay

## 2020-01-01 ENCOUNTER — Other Ambulatory Visit (INDEPENDENT_AMBULATORY_CARE_PROVIDER_SITE_OTHER): Payer: Medicare Other

## 2020-01-01 DIAGNOSIS — Z23 Encounter for immunization: Secondary | ICD-10-CM | POA: Diagnosis not present

## 2020-01-25 DIAGNOSIS — M170 Bilateral primary osteoarthritis of knee: Secondary | ICD-10-CM | POA: Diagnosis not present

## 2020-02-15 ENCOUNTER — Encounter: Payer: Self-pay | Admitting: Psychiatry

## 2020-02-15 ENCOUNTER — Telehealth (INDEPENDENT_AMBULATORY_CARE_PROVIDER_SITE_OTHER): Payer: Medicare Other | Admitting: Psychiatry

## 2020-02-15 DIAGNOSIS — F339 Major depressive disorder, recurrent, unspecified: Secondary | ICD-10-CM | POA: Diagnosis not present

## 2020-02-15 DIAGNOSIS — F5081 Binge eating disorder: Secondary | ICD-10-CM

## 2020-02-15 DIAGNOSIS — F900 Attention-deficit hyperactivity disorder, predominantly inattentive type: Secondary | ICD-10-CM

## 2020-02-15 MED ORDER — LISDEXAMFETAMINE DIMESYLATE 40 MG PO CAPS: 40.0000 mg | ORAL_CAPSULE | ORAL | 0 refills | Status: DC

## 2020-02-15 MED ORDER — BUPROPION HCL ER (XL) 300 MG PO TB24: 300.0000 mg | ORAL_TABLET | Freq: Every day | ORAL | 1 refills | Status: DC

## 2020-02-15 MED ORDER — DULOXETINE HCL 30 MG PO CPEP
ORAL_CAPSULE | ORAL | 0 refills | Status: DC
Start: 2020-02-15 — End: 2020-07-28

## 2020-02-15 MED ORDER — DULOXETINE HCL 60 MG PO CPEP
60.0000 mg | ORAL_CAPSULE | Freq: Every morning | ORAL | 1 refills | Status: DC
Start: 2020-02-15 — End: 2020-07-28

## 2020-02-15 NOTE — Progress Notes (Signed)
Megan Nunez [id number] 1945/03/04 75 y.o.  Virtual Visit via Video Note  I connected with pt @ on 02/15/20 at 11:00 AM EST by a video enabled telemedicine application and verified that I am speaking with the correct person using two identifiers.   I discussed the limitations of evaluation and management by telemedicine and the availability of in person appointments. The patient expressed understanding and agreed to proceed.  I discussed the assessment and treatment plan with the patient. The patient was provided an opportunity to ask questions and all were answered. The patient agreed with the plan and demonstrated an understanding of the instructions.   The patient was advised to call back or seek an in-person evaluation if the symptoms worsen or if the condition fails to improve as anticipated.  I provided 30 minutes of non-face-to-face time during this encounter.  The patient was located at home.  The provider was located at [facility].   Corie Chiquito, PMHNP   Subjective:   Patient ID:  Megan Nunez is a 75 y.o. (DOB 25-May-1945) female.  Chief Complaint:  Chief Complaint  Patient presents with  . Sleeping Problem  . Follow-up    Anxiety, depression, ADHD    HPI [name] presents for follow-up of depression, anxiety, and ADHD. She is accompanied by her husband. She reports that she has started staying up late until sometimes about 3-4 am . She reports that she has been unable to regulate sleep wake cycle. She reports that she is usually not sleepy until later.  She denies any worsening depression. She reports that December was "awful" in terms of depression.  Mood improved in early January. Notices that sunlight affects her mood. Denies any significant anxiety. She reports limited physical activity. Energy has been ok when mood is ok. Denies SI.   She reports that Vyvanse is helpful. She reports that she takes Vyvanse only as needed.   They were able to get to  the beach for a few days and this was enjoyable. She has been trying to be more active. She is considering knee surgery.   Discussed establishing sleep wake cycle. She prefers to wake up at 7-8 am. Denies significant caffeine intake.   Past Psychiatric Medication Trials: Sertraline Prozac Cymbalta Wellbutrin XL Adderall Adderall XR  Review of Systems:  Review of Systems  Cardiovascular: Negative for palpitations.  Musculoskeletal: Negative for gait problem.       Knee pain  Neurological:       Occasional tremor  Psychiatric/Behavioral:       Please refer to HPI    Medications: I have reviewed the patient's current medications.  Current Outpatient Medications  Medication Sig Dispense Refill  . acetaminophen (TYLENOL) 325 MG tablet Take 650 mg by mouth every 6 (six) hours as needed for mild pain.    . Ascorbic Acid (VITAMIN C) 100 MG tablet Take 100 mg by mouth daily.    . Biotin (BIOTIN 5000) 5 MG CAPS Take 2 capsules by mouth daily.     Marland Kitchen buPROPion (WELLBUTRIN XL) 300 MG 24 hr tablet Take 1 tablet (300 mg total) by mouth daily. 90 tablet 1  . cholecalciferol (VITAMIN D3) 25 MCG (1000 UNIT) tablet Take 1,000 Units by mouth daily.    . diclofenac Sodium (VOLTAREN) 1 % GEL Apply 1 application topically 4 (four) times daily.    . DULoxetine (CYMBALTA) 30 MG capsule TAKE ONE CAPSULE BY MOUTH DAILY TAKE WITH 60 MG 90 capsule 0  .  DULoxetine (CYMBALTA) 60 MG capsule Take 1 capsule (60 mg total) by mouth every morning. 90 capsule 1  . Glucosamine-Chondroitin 750-600 MG CHEW Chew 2 tablets by mouth daily.    Melene Muller ON 04/21/2020] lisdexamfetamine (VYVANSE) 40 MG capsule Take 1 capsule (40 mg total) by mouth every morning. 30 capsule 0  . [START ON 03/24/2020] lisdexamfetamine (VYVANSE) 40 MG capsule Take 1 capsule (40 mg total) by mouth every morning. 30 capsule 0  . [START ON 02/25/2020] lisdexamfetamine (VYVANSE) 40 MG capsule Take 1 capsule (40 mg total) by mouth every morning. 30 capsule  0  . Multiple Vitamins-Minerals (MULTIVITAMIN WITH MINERALS) tablet Take 1 tablet by mouth daily.    . naproxen (NAPROSYN) 375 MG tablet Take 375 mg by mouth 2 (two) times daily with a meal.    . Turmeric Curcumin 500 MG CAPS Take 1 capsule by mouth daily.     . vitamin B-12 (CYANOCOBALAMIN) 1000 MCG tablet Take 1,000 mcg by mouth daily.     Current Facility-Administered Medications  Medication Dose Route Frequency Provider Last Rate Last Admin  . methylPREDNISolone acetate (DEPO-MEDROL) injection 40 mg  40 mg Intra-articular Once Hilts, Michael, MD        Medication Side Effects: None  Allergies: No Known Allergies  Past Medical History:  Diagnosis Date  . Arthritis   . Depression   . Obesity     Family History  Problem Relation Age of Onset  . Cancer Brother 50       pancreatic cancer  . Alcohol abuse Brother   . Depression Brother   . Asthma Paternal Grandmother   . Heart disease Mother        Mitral valve disease.  . Dementia Mother   . Lung disease Father        Idiopathic pulmonary fibrosis  . Heart disease Maternal Grandmother        Died after complications of angioplasty.  . Depression Sister   . Alcohol abuse Sister   . Alcohol abuse Brother   . Depression Brother   . Alcohol abuse Brother   . Depression Cousin   . Depression Other     Social History   Socioeconomic History  . Marital status: Married    Spouse name: Not on file  . Number of children: Not on file  . Years of education: Not on file  . Highest education level: Not on file  Occupational History  . Not on file  Tobacco Use  . Smoking status: Never Smoker  . Smokeless tobacco: Never Used  Substance and Sexual Activity  . Alcohol use: Yes    Alcohol/week: 1.0 standard drink    Types: 1 Glasses of wine per week  . Drug use: No  . Sexual activity: Not Currently  Other Topics Concern  . Not on file  Social History Narrative   She is married to another patient of [facility]. They  have been married for 20  years.  No children. 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   He never smoked, and drinks maybe 1-3 glass of wine a day.   He has recently started exercising with her husband who has completed cardiac rehabilitation and is in the maintenance program. They usually walks about 2-3 days a week for 30-45 minutes.   Social Determinants of Health   Financial Resource Strain: Not on file  Food Insecurity: Not on file  Transportation Needs: Not on file  Physical Activity: Not on file  Stress: Not on file  Social Connections: Not on file  Intimate Partner Violence: Not on file    Past Medical History, Surgical history, Social history, and Family history were reviewed and updated as appropriate.   Please see review of systems for further details on the patient's review from today.   Objective:   Physical Exam:  BP (!) 142/77   Pulse 88   Physical Exam Neurological:     Mental Status: She is alert and oriented to person, place, and time.     Cranial Nerves: No dysarthria.  Psychiatric:        Attention and Perception: Attention and perception normal.        Mood and Affect: Mood normal.        Speech: Speech normal.        Behavior: Behavior is cooperative.        Thought Content: Thought content normal. Thought content is not paranoid or delusional. Thought content does not include homicidal or suicidal ideation. Thought content does not include homicidal or suicidal plan.        Cognition and Memory: Cognition and memory normal.        Judgment: Judgment normal.     Comments: Insight intact     Lab Review:     Component Value Date/Time   NA 144 04/09/2019 1239   K 5.2 04/09/2019 1239   CL 106 04/09/2019 1239   CO2 18 (L) 04/09/2019 1239   GLUCOSE 102 (H) 04/09/2019 1239   GLUCOSE 95 12/08/2016 1030   BUN 17 04/09/2019 1239   CREATININE 0.78 04/09/2019 1239   CREATININE 0.63 12/08/2016 1030   CALCIUM 9.3  04/09/2019 1239   PROT 7.0 04/09/2019 1239   ALBUMIN 4.5 04/09/2019 1239   AST 23 04/09/2019 1239   ALT 14 04/09/2019 1239   ALKPHOS 115 04/09/2019 1239   BILITOT 0.2 04/09/2019 1239   GFRNONAA 77 04/09/2019 1239   GFRAA 88 04/09/2019 1239       Component Value Date/Time   WBC 9.2 04/09/2019 1239   WBC 7.1 12/08/2016 1030   RBC 4.86 04/09/2019 1239   RBC 4.86 12/08/2016 1030   HGB 14.7 04/09/2019 1239   HCT 43.2 04/09/2019 1239   PLT 224 04/09/2019 1239   MCV 89 04/09/2019 1239   MCH 30.2 04/09/2019 1239   MCH 30.0 12/08/2016 1030   MCHC 34.0 04/09/2019 1239   MCHC 34.7 12/08/2016 1030   RDW 12.8 04/09/2019 1239   LYMPHSABS 2.1 04/09/2019 1239   MONOABS 546 12/25/2015 1405   EOSABS 0.4 04/09/2019 1239   BASOSABS 0.1 04/09/2019 1239    No results found for: POCLITH, LITHIUM   No results found for: PHENYTOIN, PHENOBARB, VALPROATE, CBMZ   .res Assessment: Plan:   Discussed strategies to improve sleep schedule to include, setting alarm to awaken early, deciding on what would be an ideal sleep schedule, and possibly taking Vyvanse early in the morning. Pt reports that she will contact office if sleep schedule does not improve.  Will continue current plan of care since target signs and symptoms are well controlled without any tolerability issues.  Jenasia was seen today for sleeping problem and follow-up.  Diagnoses and all orders for this visit:  Major depression, recurrent, chronic (HCC) -     buPROPion (WELLBUTRIN XL) 300 MG 24 hr tablet; Take 1 tablet (300 mg total) by mouth daily. -     DULoxetine (CYMBALTA) 30 MG capsule; TAKE ONE  CAPSULE BY MOUTH DAILY TAKE WITH 60 MG -     DULoxetine (CYMBALTA) 60 MG capsule; Take 1 capsule (60 mg total) by mouth every morning.  Attention deficit hyperactivity disorder (ADHD), predominantly inattentive type -     lisdexamfetamine (VYVANSE) 40 MG capsule; Take 1 capsule (40 mg total) by mouth every morning. -     lisdexamfetamine  (VYVANSE) 40 MG capsule; Take 1 capsule (40 mg total) by mouth every morning. -     lisdexamfetamine (VYVANSE) 40 MG capsule; Take 1 capsule (40 mg total) by mouth every morning.  Binge eating disorder -     lisdexamfetamine (VYVANSE) 40 MG capsule; Take 1 capsule (40 mg total) by mouth every morning.     Please see After Visit Summary for patient specific instructions.  Future Appointments  Date Time Provider Department Center  04/10/2020  8:30 AM Ronnald Nian, MD [facility]    No orders of the defined types were placed in this encounter.     -------------------------------

## 2020-02-19 DIAGNOSIS — M170 Bilateral primary osteoarthritis of knee: Secondary | ICD-10-CM | POA: Diagnosis not present

## 2020-02-19 DIAGNOSIS — M1711 Unilateral primary osteoarthritis, right knee: Secondary | ICD-10-CM | POA: Diagnosis not present

## 2020-02-19 DIAGNOSIS — M19011 Primary osteoarthritis, right shoulder: Secondary | ICD-10-CM | POA: Diagnosis not present

## 2020-02-19 DIAGNOSIS — M25561 Pain in right knee: Secondary | ICD-10-CM | POA: Diagnosis not present

## 2020-02-19 DIAGNOSIS — M25511 Pain in right shoulder: Secondary | ICD-10-CM | POA: Diagnosis not present

## 2020-02-26 ENCOUNTER — Encounter: Payer: Self-pay | Admitting: Family Medicine

## 2020-03-12 DIAGNOSIS — M1711 Unilateral primary osteoarthritis, right knee: Secondary | ICD-10-CM | POA: Diagnosis not present

## 2020-03-13 ENCOUNTER — Encounter: Payer: Self-pay | Admitting: Family Medicine

## 2020-03-18 ENCOUNTER — Ambulatory Visit (INDEPENDENT_AMBULATORY_CARE_PROVIDER_SITE_OTHER): Payer: Medicare Other | Admitting: Family Medicine

## 2020-03-18 ENCOUNTER — Encounter: Payer: Self-pay | Admitting: Family Medicine

## 2020-03-18 ENCOUNTER — Other Ambulatory Visit: Payer: Self-pay

## 2020-03-18 VITALS — BP 138/74 | HR 96 | Temp 97.5°F | Ht 59.5 in | Wt 165.8 lb

## 2020-03-18 DIAGNOSIS — Z01818 Encounter for other preprocedural examination: Secondary | ICD-10-CM

## 2020-03-18 DIAGNOSIS — F330 Major depressive disorder, recurrent, mild: Secondary | ICD-10-CM

## 2020-03-18 DIAGNOSIS — M1990 Unspecified osteoarthritis, unspecified site: Secondary | ICD-10-CM | POA: Diagnosis not present

## 2020-03-18 DIAGNOSIS — F900 Attention-deficit hyperactivity disorder, predominantly inattentive type: Secondary | ICD-10-CM

## 2020-03-18 DIAGNOSIS — M1711 Unilateral primary osteoarthritis, right knee: Secondary | ICD-10-CM

## 2020-03-18 NOTE — Progress Notes (Signed)
   Subjective:    Patient ID: Megan Nunez, female    DOB: 10/26/1945, 75 y.o.   MRN: [id number]  HPI She is here for preoperative evaluation prior to right total knee replacement.  This is scheduled for late March.  She has no underlying cardiac or pulmonary issues.  She continues on Cymbalta which she states is helping with her psychological wellbeing.  She continues in counseling for that.  She is also taking Vyvanse fairly regularly for help with her ADHD.   Review of Systems     Objective:   Physical Exam Alert and in no distress. Tympanic membranes and canals are normal. Pharyngeal area is normal. Neck is supple without adenopathy or thyromegaly. Cardiac exam shows a regular sinus rhythm without murmurs or gallops. Lungs are clear to auscultation. EKG read by me shows a sinus rhythm of 84.  Other parameters are normal.  No abnormalities were noted on EKG.       Assessment & Plan:  Preoperative evaluation to rule out surgical contraindication  Arthritis  Major depressive disorder, recurrent episode, mild (HCC)  Primary osteoarthritis of right knee  ADHD, predominantly inattentive type Cleared for surgery.  Discussed postoperative rehab including strengthening and range of motion as well as scar revision with her.

## 2020-03-21 NOTE — Addendum Note (Signed)
Addended by: Renelda Loma on: 03/21/2020 04:06 PM   Modules accepted: Orders

## 2020-03-31 DIAGNOSIS — M1711 Unilateral primary osteoarthritis, right knee: Secondary | ICD-10-CM | POA: Diagnosis not present

## 2020-04-02 ENCOUNTER — Ambulatory Visit: Payer: Medicare Other

## 2020-04-04 NOTE — Patient Instructions (Addendum)
DUE TO COVID-19 ONLY ONE VISITOR IS ALLOWED TO COME WITH YOU AND STAY IN THE WAITING ROOM ONLY DURING PRE OP AND PROCEDURE DAY OF SURGERY. THE 1 VISITOR  MAY VISIT WITH YOU AFTER SURGERY IN YOUR PRIVATE ROOM DURING VISITING HOURS ONLY!  YOU NEED TO HAVE A COVID 19 TEST ON_3/25______ @__2 :50_____, THIS TEST MUST BE DONE BEFORE SURGERY,  COVID TESTING SITE [address] [place] , IT IS ON THE RIGHT GOING OUT [address] APPROXIMATELY  2 MINUTES PAST [employer] ON THE RIGHT. ONCE YOUR COVID TEST IS COMPLETED,  PLEASE BEGIN THE QUARANTINE INSTRUCTIONS AS OUTLINED IN YOUR HANDOUT.                [id number]   Your procedure is scheduled on: 04/15/20   Report to [facility] Main  Entrance   Report to admitting at 8:00 AM     Call this number if you have problems the morning of surgery [phone]   . BRUSH YOUR TEETH MORNING OF SURGERY AND RINSE YOUR MOUTH OUT, NO CHEWING GUM CANDY OR MINTS.   No food after midnight.    You may have clear liquid until 7:30 AM.    At 7:00 AM drink pre surgery drink  . Nothing by mouth after 7:30 AM.    Take these medicines the morning of surgery with A SIP OF WATER: Wellbutrin, Cymbalta                                 You may not have any metal on your body including hair pins and              piercings  Do not wear jewelry, make-up, lotions, powders or perfumes, deodorant             Do not wear nail polish on your fingernails.  Do not shave  48 hours prior to surgery.     Do not bring valuables to the hospital. [facility] IS NOT             RESPONSIBLE   FOR VALUABLES.  Contacts, dentures or bridgework may not be worn into surgery.      Patients discharged the day of surgery will not be allowed to drive home.   IF YOU ARE HAVING SURGERY AND GOING HOME THE SAME DAY, YOU MUST HAVE AN ADULT TO DRIVE YOU HOME AND BE WITH YOU FOR 24 HOURS.  YOU MAY GO HOME BY TAXI OR UBER OR ORTHERWISE, BUT AN ADULT  MUST ACCOMPANY YOU HOME AND STAY WITH YOU FOR 24 HOURS.  Name and phone number of your driver:  Special Instructions: N/A              Please read over the following fact sheets you were given: _____________________________________________________________________             [facility] - Preparing for Surgery Before surgery, you can play an important role.  Because skin is not sterile, your skin needs to be as free of germs as possible.  You can reduce the number of germs on your skin by washing with CHG (chlorahexidine gluconate) soap before surgery.  CHG is an antiseptic cleaner which kills germs and bonds with the skin to continue killing germs even after washing. Please DO NOT use if you have an allergy to CHG or antibacterial soaps.  If your skin becomes reddened/irritated stop  using the CHG and inform your nurse when you arrive at Short Stay. Do not shave (including legs and underarms) for at least 48 hours prior to the first CHG shower.   Please follow these instructions carefully:  1.  Shower with CHG Soap the night before surgery and the  morning of Surgery.  2.  If you choose to wash your hair, wash your hair first as usual with your  normal  shampoo.  3.  After you shampoo, rinse your hair and body thoroughly to remove the  shampoo.                                        4.  Use CHG as you would any other liquid soap.  You can apply chg directly  to the skin and wash                       Gently with a scrungie or clean washcloth.  5.  Apply the CHG Soap to your body ONLY FROM THE NECK DOWN.   Do not use on face/ open                           Wound or open sores. Avoid contact with eyes, ears mouth and genitals (private parts).                       Wash face,  Genitals (private parts) with your normal soap.             6.  Wash thoroughly, paying special attention to the area where your surgery  will be performed.  7.  Thoroughly rinse your body with warm water from the neck  down.  8.  DO NOT shower/wash with your normal soap after using and rinsing off  the CHG Soap.             9.  Pat yourself dry with a clean towel.            10.  Wear clean pajamas.            11.  Place clean sheets on your bed the night of your first shower and do not  sleep with pets. Day of Surgery : Do not apply any lotions/deodorants the morning of surgery.  Please wear clean clothes to the hospital/surgery center.  FAILURE TO FOLLOW THESE INSTRUCTIONS MAY RESULT IN THE CANCELLATION OF YOUR SURGERY PATIENT SIGNATURE_________________________________  NURSE SIGNATURE__________________________________  ________________________________________________________________________   Megan Nunez  An incentive spirometer is a tool that can help keep your lungs clear and active. This tool measures how well you are filling your lungs with each breath. Taking long deep breaths may help reverse or decrease the chance of developing breathing (pulmonary) problems (especially infection) following:  A long period of time when you are unable to move or be active. BEFORE THE PROCEDURE   If the spirometer includes an indicator to show your best effort, your nurse or respiratory therapist will set it to a desired goal.  If possible, sit up straight or lean slightly forward. Try not to slouch.  Hold the incentive spirometer in an upright position. INSTRUCTIONS FOR USE  1. Sit on the edge of your bed if possible, or sit up as far as you can in bed or on a  chair. 2. Hold the incentive spirometer in an upright position. 3. Breathe out normally. 4. Place the mouthpiece in your mouth and seal your lips tightly around it. 5. Breathe in slowly and as deeply as possible, raising the piston or the ball toward the top of the column. 6. Hold your breath for 3-5 seconds or for as long as possible. Allow the piston or ball to fall to the bottom of the column. 7. Remove the mouthpiece from your mouth  and breathe out normally. 8. Rest for a few seconds and repeat Steps 1 through 7 at least 10 times every 1-2 hours when you are awake. Take your time and take a few normal breaths between deep breaths. 9. The spirometer may include an indicator to show your best effort. Use the indicator as a goal to work toward during each repetition. 10. After each set of 10 deep breaths, practice coughing to be sure your lungs are clear. If you have an incision (the cut made at the time of surgery), support your incision when coughing by placing a pillow or rolled up towels firmly against it. Once you are able to get out of bed, walk around indoors and cough well. You may stop using the incentive spirometer when instructed by your caregiver.  RISKS AND COMPLICATIONS  Take your time so you do not get dizzy or light-headed.  If you are in pain, you may need to take or ask for pain medication before doing incentive spirometry. It is harder to take a deep breath if you are having pain. AFTER USE  Rest and breathe slowly and easily.  It can be helpful to keep track of a log of your progress. Your caregiver can provide you with a simple table to help with this. If you are using the spirometer at home, follow these instructions: SEEK MEDICAL CARE IF:   You are having difficultly using the spirometer.  You have trouble using the spirometer as often as instructed.  Your pain medication is not giving enough relief while using the spirometer.  You develop fever of 100.5 F (38.1 C) or higher. SEEK IMMEDIATE MEDICAL CARE IF:   You cough up bloody sputum that had not been present before.  You develop fever of 102 F (38.9 C) or greater.  You develop worsening pain at or near the incision site. MAKE SURE YOU:   Understand these instructions.  Will watch your condition.  Will get help right away if you are not doing well or get worse. Document Released: 05/17/2006 Document Revised: 03/29/2011 Document  Reviewed: 07/18/2006 [facility] Patient Information 2014 [place], [place].   ________________________________________________________________________

## 2020-04-07 ENCOUNTER — Encounter (HOSPITAL_COMMUNITY): Payer: Self-pay

## 2020-04-07 ENCOUNTER — Ambulatory Visit (HOSPITAL_BASED_OUTPATIENT_CLINIC_OR_DEPARTMENT_OTHER): Payer: Medicare Other | Admitting: Physical Therapy

## 2020-04-07 ENCOUNTER — Encounter (HOSPITAL_COMMUNITY)
Admission: RE | Admit: 2020-04-07 | Discharge: 2020-04-07 | Disposition: A | Discharge status: Home / Self Care | Payer: Medicare Other | Source: Ambulatory Visit | Attending: Orthopedic Surgery | Admitting: Orthopedic Surgery

## 2020-04-07 ENCOUNTER — Other Ambulatory Visit: Payer: Self-pay

## 2020-04-07 DIAGNOSIS — Z01812 Encounter for preprocedural laboratory examination: Secondary | ICD-10-CM | POA: Insufficient documentation

## 2020-04-07 HISTORY — DX: Inflammatory liver disease, unspecified: K75.9

## 2020-04-07 HISTORY — DX: Anxiety disorder, unspecified: F41.9

## 2020-04-07 LAB — CBC
HCT: 43 % (ref 36.0–46.0)
Hemoglobin: 13.9 g/dL (ref 12.0–15.0)
MCH: 30.2 pg (ref 26.0–34.0)
MCHC: 32.3 g/dL (ref 30.0–36.0)
MCV: 93.3 fL (ref 80.0–100.0)
Platelets: 192 10*3/uL (ref 150–400)
RBC: 4.61 MIL/uL (ref 3.87–5.11)
RDW: 14.1 % (ref 11.5–15.5)
WBC: 8.7 10*3/uL (ref 4.0–10.5)
nRBC: 0 % (ref 0.0–0.2)

## 2020-04-07 LAB — SURGICAL PCR SCREEN
MRSA, PCR: NEGATIVE
Staphylococcus aureus: NEGATIVE

## 2020-04-07 NOTE — Progress Notes (Signed)
COVID Vaccine Completed:yes Date COVID Vaccine completed:03/22/19-booster 11/30/19 COVID vaccine manufacturer: Pfizer     PCP - Dr. Jill Alexanders Cardiologist - none  Chest x-ray - no EKG - 03/18/20-epic Stress Test - MET test 04/02/13-epic ECHO -  Cardiac Cath - no Pacemaker/ICD device last checked:no  Sleep Study - no CPAP -   Fasting Blood Sugar - NA Checks Blood Sugar _____ times a day  Blood Thinner Instructions:NA Aspirin Instructions: Last Dose:  Anesthesia review:   Patient denies shortness of breath, fever, cough and chest pain at PAT appointment yes  Patient verbalized understanding of instructions that were given to them at the PAT appointment. Patient was also instructed that they will need to review over the PAT instructions again at home before surgery.yes Pt doesn't climb stairs but no SOB doing housework or ADLs

## 2020-04-08 DIAGNOSIS — M25462 Effusion, left knee: Secondary | ICD-10-CM | POA: Diagnosis not present

## 2020-04-08 DIAGNOSIS — M25562 Pain in left knee: Secondary | ICD-10-CM | POA: Diagnosis not present

## 2020-04-08 DIAGNOSIS — M170 Bilateral primary osteoarthritis of knee: Secondary | ICD-10-CM | POA: Diagnosis not present

## 2020-04-08 NOTE — Care Plan (Signed)
Ortho Bundle Case Management Note ° °Patient Details  °Name: Megan Nunez °MRN: [id number] °Date of Birth: 01/23/1945 ° °Met with patien tin the office prior to surgery. She will discharge to home with husband to assist. Rolling walker ordered for home use. HHPT referral to [facility] and OPPT set up with [facility]. Patient and MD in agreement with plan. Choice offered  °                ° ° ° °DME Arranged:  Walker rolling °DME Agency:    ° °HH Arranged:  PT °HH Agency:  [facility] (formerly [facility]) ° °Additional Comments: °Please contact me with any questions of if this plan should need to change. ° °Renee Angiulli,  RN,BSN,MHA,CCM  [facility]  [phone] °04/08/2020, 11:16 AM ° ° °

## 2020-04-09 ENCOUNTER — Ambulatory Visit (HOSPITAL_BASED_OUTPATIENT_CLINIC_OR_DEPARTMENT_OTHER): Payer: Medicare Other | Attending: Orthopedic Surgery | Admitting: Physical Therapy

## 2020-04-09 ENCOUNTER — Other Ambulatory Visit: Payer: Self-pay

## 2020-04-09 ENCOUNTER — Encounter (HOSPITAL_BASED_OUTPATIENT_CLINIC_OR_DEPARTMENT_OTHER): Payer: Self-pay | Admitting: Physical Therapy

## 2020-04-09 DIAGNOSIS — M25561 Pain in right knee: Secondary | ICD-10-CM | POA: Insufficient documentation

## 2020-04-09 DIAGNOSIS — M25661 Stiffness of right knee, not elsewhere classified: Secondary | ICD-10-CM | POA: Diagnosis not present

## 2020-04-09 DIAGNOSIS — M25562 Pain in left knee: Secondary | ICD-10-CM | POA: Diagnosis not present

## 2020-04-09 DIAGNOSIS — G8929 Other chronic pain: Secondary | ICD-10-CM | POA: Insufficient documentation

## 2020-04-09 DIAGNOSIS — M25662 Stiffness of left knee, not elsewhere classified: Secondary | ICD-10-CM | POA: Diagnosis not present

## 2020-04-10 ENCOUNTER — Encounter: Payer: Self-pay | Admitting: Family Medicine

## 2020-04-10 ENCOUNTER — Ambulatory Visit (INDEPENDENT_AMBULATORY_CARE_PROVIDER_SITE_OTHER): Payer: Medicare Other | Admitting: Family Medicine

## 2020-04-10 VITALS — BP 128/76 | HR 70 | Temp 97.8°F | Ht 60.5 in | Wt 165.6 lb

## 2020-04-10 DIAGNOSIS — M8580 Other specified disorders of bone density and structure, unspecified site: Secondary | ICD-10-CM

## 2020-04-10 DIAGNOSIS — Z0000 Encounter for general adult medical examination without abnormal findings: Secondary | ICD-10-CM

## 2020-04-10 DIAGNOSIS — H9313 Tinnitus, bilateral: Secondary | ICD-10-CM

## 2020-04-10 DIAGNOSIS — M1711 Unilateral primary osteoarthritis, right knee: Secondary | ICD-10-CM | POA: Diagnosis not present

## 2020-04-10 DIAGNOSIS — M19011 Primary osteoarthritis, right shoulder: Secondary | ICD-10-CM

## 2020-04-10 DIAGNOSIS — M1990 Unspecified osteoarthritis, unspecified site: Secondary | ICD-10-CM | POA: Diagnosis not present

## 2020-04-10 DIAGNOSIS — F330 Major depressive disorder, recurrent, mild: Secondary | ICD-10-CM

## 2020-04-10 DIAGNOSIS — N3946 Mixed incontinence: Secondary | ICD-10-CM

## 2020-04-10 DIAGNOSIS — F900 Attention-deficit hyperactivity disorder, predominantly inattentive type: Secondary | ICD-10-CM

## 2020-04-10 DIAGNOSIS — Z13220 Encounter for screening for lipoid disorders: Secondary | ICD-10-CM | POA: Diagnosis not present

## 2020-04-10 DIAGNOSIS — N393 Stress incontinence (female) (male): Secondary | ICD-10-CM

## 2020-04-10 MED ORDER — SOLIFENACIN SUCCINATE 5 MG PO TABS: 5.0000 mg | ORAL_TABLET | Freq: Every day | ORAL | 0 refills | Status: DC

## 2020-04-10 NOTE — Therapy (Signed)
[facility] [address] [place], [place], [address] Phone: [phone]   Fax:  [phone]  Physical Therapy Evaluation  Patient Details  Name: Megan Nunez  MRN: [id number] Date of Birth: 06/24/1945 Referring Provider (PT): Teryl Lucy, MD   Encounter Date: 04/09/2020   PT End of Session - 04/10/20 0923    Visit Number 1    Number of Visits 1    Authorization Type UHC Medicare    PT Start Time 1430    PT Stop Time 1520    PT Time Calculation (min) 50 min    Activity Tolerance Patient tolerated treatment well    Behavior During Therapy [facility] for tasks assessed/performed           Past Medical History:  Diagnosis Date  . Anxiety   . Arthritis   . Depression   . Family history of adverse reaction to anesthesia 2012   Mother has cardiac  arrest after surgery  . Hepatitis    high antibodies for hepatitis  . Obesity     Past Surgical History:  Procedure Laterality Date  . LAPAROSCOPIC TUBAL LIGATION  1985  . WISDOM TOOTH EXTRACTION     age 75    There were no vitals filed for this visit.    Subjective Assessment - 04/09/20 1432    Subjective Pt reports wrenching her L knee during the weekend. Pt saw an orthopedist who drained her L knee -- she is now able to walk. Pt is to get R TKA 3/29. Pt has been icing her L knee. Pt did have L knee issues prior but R knee was always worse. Pt currently wearing hinged knee brace on R with ACE wrap on L knee.    Limitations Walking    How long can you sit comfortably? n/a    How long can you stand comfortably? This feels the worse. ~5 min before needing to sit.    How long can you walk comfortably? ~4 blocks was the limit    Patient Stated Goals Know what she needs to do for operation and post-rehab    Currently in Pain? Yes    Pain Score --   Not able to quantify   Pain Location Knee    Pain Orientation Right;Left    Pain Descriptors / Indicators Aching;Throbbing;Sore     Pain Type Chronic pain;Acute pain    Pain Onset More than a month ago    Pain Frequency Constant    Aggravating Factors  Increased time in standing    Pain Relieving Factors Rest    Effect of Pain on Daily Activities Limited standing for home tasks and walking              [facility] PT Assessment - 04/10/20 0001      Assessment   Medical Diagnosis R and L knee pain    Referring Provider (PT) Teryl Lucy, MD    Onset Date/Surgical Date --   Surgery to be 3/29   Next MD Visit 3/29 for surgery    Prior Therapy None      Precautions   Precautions None      Restrictions   Weight Bearing Restrictions No      Balance Screen   Has the patient fallen in the past 6 months No      Home Environment   Living Environment Private residence    Living Arrangements Spouse/significant other    Available Help at Discharge Family  Type of Home House    Home Access Level entry    Home Layout Multi-level    Alternate Level Stairs-Number of Steps 2   Big steps   Alternate Level Stairs-Rails None    Home Equipment Walker - 2 wheels      Prior Function   Level of Independence Independent      Observation/Other Assessments   Focus on Therapeutic Outcomes (FOTO)  n/a      AROM   Right Knee Extension -9    Right Knee Flexion 100    Left Knee Extension -5    Left Knee Flexion 116      Strength   Overall Strength Comments Strength grossly WFL; L quad appears inhibited due to pain this session                      Objective measurements completed on examination: See above findings.               PT Education - 04/10/20 0921    Education Details Most of session discussing prep for R TKR. Discussion included: home set-up, bathroom set-up, managing steps, what to bring for surgery, expectations for rehab afterward, importance of managing edema, importance of keeping strength in quads and obtaining knee ROM. Increased time spent answering pt and spouse's  questions.    Person(s) Educated Patient;Spouse    Methods Explanation;Demonstration    Comprehension Verbalized understanding;Returned demonstration;Tactile cues required;Need further instruction            PT Short Term Goals - 04/10/20 0928      PT SHORT TERM GOAL #1   Title Pt and husband will verbalize understanding of preparation for TKR    Time 1    Period Days    Status Achieved    Target Date 04/09/20      PT SHORT TERM GOAL #2   Title PT will have answered all of pt and husband's questions in regards to therapy    Time 1    Period Days    Status Achieved    Target Date 04/09/20                     Plan - 04/10/20 0924    Clinical Impression Statement [name] is a 75 y/o F presenting to OPPT for prehab assessment and education for TKR. On assessment, pt with decreased bilat knee ROM, pain, and muscle endurance affecting her standing, walking, and daily activities. Most of session primarily focused on providing pt education on safe home set up, expectations for before and after surgery. Discussed strengthening quad but due to increased time spent answering pt and spouse's questions unable to formally go through exercises (besides quad setting), ambulating with walker, or managing steps.    Personal Factors and Comorbidities 75;Fitness;Past/Current Experience;Time since onset of injury/illness/exacerbation    Examination-Activity Limitations Transfers;Locomotion Level;Stairs;Stand;Hygiene/Grooming    Examination-Participation Restrictions Community Activity;Driving;Cleaning    Stability/Clinical Decision Making Stable/Uncomplicated    Clinical Decision Making Low    Rehab Potential Good    PT Frequency One time visit   Pt to return post surgery after HHPT   PT Treatment/Interventions ADLs/Self Care Home Management;Therapeutic exercise    PT Next Visit Plan Formal eval after surgery 3/29.    Consulted and Agree with Plan of Care Patient;Family  member/caregiver    Family Member Consulted Husband           Patient will benefit from skilled therapeutic intervention  in order to improve the following deficits and impairments:  Abnormal gait,Increased fascial restricitons,Pain,Decreased mobility,Decreased activity tolerance,Decreased range of motion,Decreased strength,Hypomobility,Decreased balance,Difficulty walking,Increased edema  Visit Diagnosis: Chronic pain of left knee  Chronic pain of right knee  Stiffness of right knee, not elsewhere classified  Stiffness of left knee, not elsewhere classified     Problem List Patient Active Problem List   Diagnosis Date Noted  . Osteopenia 04/10/2020  . Major depressive disorder, recurrent episode, mild (HCC) 10/31/2017  . ADHD, predominantly inattentive type 10/31/2017  . Primary osteoarthritis of right knee 09/09/2016  . Primary osteoarthritis, right shoulder 09/09/2016  . Arthritis 07/25/2012  . Tinnitus 07/25/2012  . Mixed incontinence urge and stress 07/25/2012    [facility] April Ma L [place] PT, DPT 04/10/2020, 1:12 PM  [facility] [address] [place], [place], [address] Phone: [phone]   Fax:  [phone]  Name: Megan Nunez MRN: [id number] Date of Birth: Feb 22, 1945

## 2020-04-10 NOTE — Patient Instructions (Signed)
  Ms. Wiker , Thank you for taking time to come for your Medicare Wellness Visit. I appreciate your ongoing commitment to your health goals. Please review the following plan we discussed and let me know if I can assist you in the future.   These are the goals we discussed: Wait several weeks postoperatively and then start the medicine for your bladder.  Send me a message through MyChart after several weeks of the new medicine and let me know how it is working. This is a list of the screening recommended for you and due dates:  Health Maintenance  Topic Date Due  . Cologuard (Stool DNA test)  02/04/2021  . Mammogram  07/25/2021  . Tetanus Vaccine  02/13/2028  . Flu Shot  Completed  . DEXA scan (bone density measurement)  Completed  . COVID-19 Vaccine  Completed  .  Hepatitis C: One time screening is recommended by Center for Disease Control  (CDC) for  adults born from [birth year] through 1963.   Completed  . Pneumonia vaccines  Completed  . HPV Vaccine  Aged Out

## 2020-04-10 NOTE — Progress Notes (Signed)
Megan Nunez is a 75 y.o. female who presents for annual wellness visit,CPE and follow-up on chronic medical conditions.  She has right knee TKR scheduled in the near future.  She also plans to have the left knee done at some point in the past.  She also has difficulty with arthritis of the right shoulder.  She is seeing Dr. Dion Saucier for all of these issues.  She continues in counseling with Megan Nunez at [facility] who is handling her psychotropic medications as well as her ADD med.  Things seem to be going fairly well there.  She does complain of urinary incontinence stating that sometimes she has the urge and does not make it in time and sometimes also notes coughing and sneezing can cause difficulty.  She also has tinnitus but recognizes not much can be done for that.  Immunizations and Health Maintenance Immunization History  Administered Date(s) Administered  . Fluad Quad(high Dose 65+) 01/01/2020  . Influenza Split 01/27/2011  . Influenza, High Dose Seasonal PF 11/14/2013, 11/05/2014, 12/25/2015, 09/23/2016, 01/03/2018  . Influenza,inj,Quad PF,6+ Mos 03/20/2013  . PFIZER(Purple Top)SARS-COV-2 Vaccination 02/25/2019, 03/22/2019, 11/30/2019  . Pneumococcal Conjugate-13 07/12/2013  . Pneumococcal Polysaccharide-23 11/05/2014  . Tdap 08/27/2008, 02/12/2018  . Zoster 12/30/2008  . Zoster Recombinat (Shingrix) 02/12/2018, 08/04/2018   There are no preventive care reminders to display for this patient.  Last Pap smear: age/d out  Last mammogram: 07/26/19 Last colonoscopy: 02/04/18 cologuard Last DEXA: 10/19/19 Dentist: Q six months Ophtho: Q two years Exercise: Not at this time due to knees  Other doctors caring for patient include: Dr. Berline Chough ortho Dr  Dion Saucier  Advanced directives: Does Patient Have a Medical Advance Directive?: Yes Type of Advance Directive: Living will Does patient want to make changes to medical advance directive?: No - Patient declined  Depression screen:  See  questionnaire below.  Depression screen [facility] 2/9 04/10/2020 03/18/2020 07/06/2019 04/09/2019 01/03/2018  Decreased Interest 0 0 0 1 2  Down, Depressed, Hopeless 1 0 0 1 1  PHQ - 2 Score 1 0 0 2 3  Altered sleeping - - - 1 1  Tired, decreased energy - - - 2 1  Change in appetite - - - 2 0  Feeling bad or failure about yourself  - - - 3 3  Trouble concentrating - - - 3 3  Moving slowly or fidgety/restless - - - 0 2  Suicidal thoughts - - - 0 0  PHQ-9 Score - - - 13 13  Difficult doing work/chores - - - Somewhat difficult Somewhat difficult    Fall Risk Screen: see questionnaire below. Fall Risk  04/10/2020 04/09/2019 12/13/2018 01/03/2018 12/05/2017  Falls in the past year? 0 0 0 0 0  Comment - - Emmi Telephone Survey: data to providers prior to load - Emmi Telephone Survey: data to providers prior to load  Number falls in past yr: 0 - - - -  Injury with Fall? 0 - - - -  Risk for fall due to : No Fall Risks - - - -  Follow up Falls evaluation completed - - - -    ADL screen:  See questionnaire below Functional Status Survey: Is the patient deaf or have difficulty hearing?: No Does the patient have difficulty seeing, even when wearing glasses/contacts?: No Does the patient have difficulty concentrating, remembering, or making decisions?: Yes Does the patient have difficulty walking or climbing stairs?: Yes (due to both knees) Does the patient have difficulty dressing or bathing?: No  Does the patient have difficulty doing errands alone such as visiting a doctor's office or shopping?: No   Review of Systems Constitutional: -, -unexpected weight change, -anorexia, -fatigue Allergy: -sneezing, -itching, -congestion Dermatology: denies changing moles, rash, lumps ENT: -runny nose, -ear pain, -sore throat,  Cardiology:  -chest pain, -palpitations, -orthopnea, Respiratory: -cough, -shortness of breath, -dyspnea on exertion, -wheezing,  Gastroenterology: -abdominal pain, -nausea, -vomiting,  -diarrhea, -constipation, -dysphagia Hematology: -bleeding or bruising problems Musculoskeletal: -arthralgias, -myalgias, -joint swelling, -back pain, - Ophthalmology: -vision changes,  Urology: -dysuria, -difficulty urinating,  -urinary frequency, -urgency, incontinence Neurology: -, -numbness, , -memory loss, -falls, -dizziness    PHYSICAL EXAM:   General Appearance: Alert, cooperative, no distress, appears stated age Head: Normocephalic, without obvious abnormality, atraumatic Eyes: PERRL, conjunctiva/corneas clear, EOM's intact,  Ears: Normal TM's and external ear canals Nose: Nares normal, mucosa normal, no drainage or sinus tenderness Throat: Lips, mucosa, and tongue normal; teeth and gums normal Neck: Supple, no lymphadenopathy;  thyroid:  no enlargement/tenderness/nodules; no carotid bruit or JVD Lungs: Clear to auscultation bilaterally without wheezes, rales or ronchi; respirations unlabored Heart: Regular rate and rhythm, S1 and S2 normal, no murmur, rubor gallop Abdomen: Soft, non-tender, nondistended, normoactive bowel sounds,  no masses, no hepatosplenomegaly Skin:  Skin color, texture, turgor normal, no rashes or lesions Lymph nodes: Cervical, supraclavicular, and axillary nodes normal Neurologic:  CNII-XII intact, normal strength, sensation and gait; reflexes 2+ and symmetric throughout Psych: Normal mood, affect, hygiene and grooming.  ASSESSMENT/PLAN: Routine general medical examination at a health care facility - Plan: Lipid panel, Comprehensive metabolic panel, solifenacin (VESICARE) 5 MG tablet  Arthritis  Primary osteoarthritis, right shoulder  Primary osteoarthritis of right knee  Major depressive disorder, recurrent episode, mild (HCC) - Plan: Lipid panel, Comprehensive metabolic panel  ADHD, predominantly inattentive type  Stress incontinence in female - Plan: Comprehensive metabolic panel, solifenacin (VESICARE) 5 MG tablet  Tinnitus of both  ears  Screening for lipid disorders - Plan: Lipid panel  Mixed incontinence urge and stress Wait several weeks postoperatively and then start the medicine for your bladder.  Send me a message through MyChart after several weeks of the new medicine and let me know how it is working. Explained that we will do another DEXA scan on her in another year. Discussed  healthy diet, including goals of calcium and vitamin D intake and alcohol recommendations (less than or equal to 1 drink/day) reviewed; regular seatbelt use; changing batteries in smoke detectors.  Immunization recommendations discussed.  Colonoscopy recommendations reviewed She will continue to be followed for her psychological issues.  Medicare Attestation I have personally reviewed: The patient's medical and social history Their use of alcohol, tobacco or illicit drugs Their current medications and supplements The patient's functional ability including ADLs,fall risks, home safety risks, cognitive, and hearing and visual impairment Diet and physical activities Evidence for depression or mood disorders  The patient's weight, height, and BMI have been recorded in the chart.  I have made referrals, counseling, and provided education to the patient based on review of the above and I have provided the patient with a written personalized care plan for preventive services.     Sharlot Gowda, MD   04/10/2020

## 2020-04-11 ENCOUNTER — Other Ambulatory Visit (HOSPITAL_COMMUNITY)
Admission: RE | Admit: 2020-04-11 | Discharge: 2020-04-11 | Disposition: A | Discharge status: Home / Self Care | Payer: Medicare Other | Source: Ambulatory Visit | Attending: Orthopedic Surgery | Admitting: Orthopedic Surgery

## 2020-04-11 DIAGNOSIS — Z20822 Contact with and (suspected) exposure to covid-19: Secondary | ICD-10-CM | POA: Insufficient documentation

## 2020-04-11 DIAGNOSIS — Z01812 Encounter for preprocedural laboratory examination: Secondary | ICD-10-CM | POA: Insufficient documentation

## 2020-04-11 LAB — COMPREHENSIVE METABOLIC PANEL
ALT: 16 IU/L (ref 0–32)
AST: 23 IU/L (ref 0–40)
Albumin/Globulin Ratio: 2.1 (ref 1.2–2.2)
Albumin: 4.7 g/dL (ref 3.7–4.7)
Alkaline Phosphatase: 117 IU/L (ref 44–121)
BUN/Creatinine Ratio: 25 (ref 12–28)
BUN: 16 mg/dL (ref 8–27)
Bilirubin Total: 0.2 mg/dL (ref 0.0–1.2)
CO2: 23 mmol/L (ref 20–29)
Calcium: 9.5 mg/dL (ref 8.7–10.3)
Chloride: 102 mmol/L (ref 96–106)
Creatinine, Ser: 0.63 mg/dL (ref 0.57–1.00)
Globulin, Total: 2.2 g/dL (ref 1.5–4.5)
Glucose: 109 mg/dL — ABNORMAL HIGH (ref 65–99)
Potassium: 5.6 mmol/L — ABNORMAL HIGH (ref 3.5–5.2)
Sodium: 141 mmol/L (ref 134–144)
Total Protein: 6.9 g/dL (ref 6.0–8.5)
eGFR: 94 mL/min/{1.73_m2} (ref >59)

## 2020-04-11 LAB — LIPID PANEL
Chol/HDL Ratio: 3 ratio (ref 0.0–4.4)
Cholesterol, Total: 172 mg/dL (ref 100–199)
HDL: 57 mg/dL (ref >39)
LDL Chol Calc (NIH): 102 mg/dL — ABNORMAL HIGH (ref 0–99)
Triglycerides: 70 mg/dL (ref 0–149)
VLDL Cholesterol Cal: 13 mg/dL (ref 5–40)

## 2020-04-11 LAB — SARS CORONAVIRUS 2 (TAT 6-24 HRS): SARS Coronavirus 2: NEGATIVE

## 2020-04-14 NOTE — Anesthesia Preprocedure Evaluation (Addendum)
Anesthesia Evaluation  Patient identified by MRN, date of birth, ID band Patient awake    Reviewed: Allergy & Precautions, H&P , NPO status , Patient's Chart, lab work & pertinent test results  Airway Mallampati: III  TM Distance: >3 FB Neck ROM: Full    Dental no notable dental hx. (+) Teeth Intact, Dental Advisory Given   Pulmonary neg pulmonary ROS,    Pulmonary exam normal breath sounds clear to auscultation       Cardiovascular Exercise Tolerance: Good negative cardio ROS   Rhythm:Regular Rate:Normal     Neuro/Psych Anxiety Depression negative neurological ROS     GI/Hepatic negative GI ROS, Neg liver ROS,   Endo/Other  negative endocrine ROS  Renal/GU negative Renal ROS  negative genitourinary   Musculoskeletal  (+) Arthritis , Osteoarthritis,    Abdominal   Peds  Hematology negative hematology ROS (+)   Anesthesia Other Findings   Reproductive/Obstetrics negative OB ROS                            Anesthesia Physical Anesthesia Plan  ASA: II  Anesthesia Plan: Spinal   Post-op Pain Management:  Regional for Post-op pain   Induction: Intravenous  PONV Risk Score and Plan: 3 and Ondansetron, Dexamethasone, Propofol infusion and Midazolam  Airway Management Planned: Simple Face Mask  Additional Equipment:   Intra-op Plan:   Post-operative Plan:   Informed Consent: I have reviewed the patients History and Physical, chart, labs and discussed the procedure including the risks, benefits and alternatives for the proposed anesthesia with the patient or authorized representative who has indicated his/her understanding and acceptance.     Dental advisory given  Plan Discussed with: CRNA  Anesthesia Plan Comments:        Anesthesia Quick Evaluation
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
## 2020-04-15 ENCOUNTER — Encounter (HOSPITAL_COMMUNITY)
Admission: RE | Disposition: A | Discharge status: Home / Self Care | Payer: Self-pay | Source: Home / Self Care | Attending: Orthopedic Surgery

## 2020-04-15 ENCOUNTER — Ambulatory Visit (HOSPITAL_COMMUNITY)
Admission: RE | Admit: 2020-04-15 | Discharge: 2020-04-16 | Disposition: A | Discharge status: Home / Self Care | Payer: Medicare Other | Attending: Orthopedic Surgery | Admitting: Orthopedic Surgery

## 2020-04-15 ENCOUNTER — Ambulatory Visit (HOSPITAL_COMMUNITY): Payer: Medicare Other

## 2020-04-15 ENCOUNTER — Encounter (HOSPITAL_COMMUNITY): Payer: Self-pay | Admitting: Orthopedic Surgery

## 2020-04-15 ENCOUNTER — Ambulatory Visit (HOSPITAL_COMMUNITY): Payer: Medicare Other | Admitting: Anesthesiology

## 2020-04-15 ENCOUNTER — Other Ambulatory Visit: Payer: Self-pay

## 2020-04-15 DIAGNOSIS — M1731 Unilateral post-traumatic osteoarthritis, right knee: Secondary | ICD-10-CM | POA: Insufficient documentation

## 2020-04-15 DIAGNOSIS — Z91048 Other nonmedicinal substance allergy status: Secondary | ICD-10-CM | POA: Insufficient documentation

## 2020-04-15 DIAGNOSIS — Z96651 Presence of right artificial knee joint: Secondary | ICD-10-CM

## 2020-04-15 DIAGNOSIS — M25761 Osteophyte, right knee: Secondary | ICD-10-CM | POA: Diagnosis not present

## 2020-04-15 DIAGNOSIS — M1711 Unilateral primary osteoarthritis, right knee: Secondary | ICD-10-CM | POA: Diagnosis not present

## 2020-04-15 DIAGNOSIS — M2341 Loose body in knee, right knee: Secondary | ICD-10-CM | POA: Diagnosis not present

## 2020-04-15 DIAGNOSIS — M170 Bilateral primary osteoarthritis of knee: Secondary | ICD-10-CM | POA: Diagnosis not present

## 2020-04-15 DIAGNOSIS — G8918 Other acute postprocedural pain: Secondary | ICD-10-CM | POA: Diagnosis not present

## 2020-04-15 DIAGNOSIS — Z79899 Other long term (current) drug therapy: Secondary | ICD-10-CM | POA: Insufficient documentation

## 2020-04-15 DIAGNOSIS — M1712 Unilateral primary osteoarthritis, left knee: Secondary | ICD-10-CM | POA: Diagnosis not present

## 2020-04-15 DIAGNOSIS — Z791 Long term (current) use of non-steroidal anti-inflammatories (NSAID): Secondary | ICD-10-CM | POA: Diagnosis not present

## 2020-04-15 DIAGNOSIS — Z471 Aftercare following joint replacement surgery: Secondary | ICD-10-CM | POA: Diagnosis not present

## 2020-04-15 DIAGNOSIS — Z96642 Presence of left artificial hip joint: Secondary | ICD-10-CM | POA: Diagnosis not present

## 2020-04-15 DIAGNOSIS — M19011 Primary osteoarthritis, right shoulder: Secondary | ICD-10-CM | POA: Diagnosis not present

## 2020-04-15 HISTORY — PX: TOTAL KNEE ARTHROPLASTY: SHX125

## 2020-04-15 SURGERY — ARTHROPLASTY, KNEE, TOTAL
Anesthesia: Spinal | Site: Knee | Laterality: Right

## 2020-04-15 MED ORDER — TRANEXAMIC ACID-NACL 1000-0.7 MG/100ML-% IV SOLN
1000.0000 mg | INTRAVENOUS | Status: AC
  Administered 2020-04-15: 1000 mg via INTRAVENOUS
  Filled 2020-04-15: qty 100

## 2020-04-15 MED ORDER — ASPIRIN EC 325 MG PO TBEC
325.0000 mg | DELAYED_RELEASE_TABLET | Freq: Every day | ORAL | Status: DC
  Administered 2020-04-16: 325 mg via ORAL
  Filled 2020-04-15: qty 1

## 2020-04-15 MED ORDER — FENTANYL CITRATE (PF) 100 MCG/2ML IJ SOLN
INTRAMUSCULAR | Status: AC
  Filled 2020-04-15: qty 2

## 2020-04-15 MED ORDER — MIDAZOLAM HCL 2 MG/2ML IJ SOLN
INTRAMUSCULAR | Status: AC
  Filled 2020-04-15: qty 2

## 2020-04-15 MED ORDER — WATER FOR IRRIGATION, STERILE IR SOLN
Status: DC | PRN
  Administered 2020-04-15: 2000 mL

## 2020-04-15 MED ORDER — BUPIVACAINE IN DEXTROSE 0.75-8.25 % IT SOLN
INTRATHECAL | Status: DC | PRN
  Administered 2020-04-15: 1.6 mL via INTRATHECAL

## 2020-04-15 MED ORDER — EPHEDRINE 5 MG/ML INJ
INTRAVENOUS | Status: AC
  Filled 2020-04-15: qty 10

## 2020-04-15 MED ORDER — BISACODYL 10 MG RE SUPP: 10.0000 mg | Freq: Every day | RECTAL | Status: DC | PRN

## 2020-04-15 MED ORDER — MENTHOL 3 MG MT LOZG: 1.0000 | LOZENGE | OROMUCOSAL | Status: DC | PRN

## 2020-04-15 MED ORDER — TRANEXAMIC ACID-NACL 1000-0.7 MG/100ML-% IV SOLN
1000.0000 mg | Freq: Once | INTRAVENOUS | Status: AC
  Administered 2020-04-15: 1000 mg via INTRAVENOUS
  Filled 2020-04-15: qty 100

## 2020-04-15 MED ORDER — FENTANYL CITRATE (PF) 100 MCG/2ML IJ SOLN
INTRAMUSCULAR | Status: DC | PRN
  Administered 2020-04-15: 50 ug via INTRAVENOUS
  Administered 2020-04-15 (×2): 25 ug via INTRAVENOUS

## 2020-04-15 MED ORDER — KETOROLAC TROMETHAMINE 30 MG/ML IJ SOLN
INTRAMUSCULAR | Status: AC
  Filled 2020-04-15: qty 1

## 2020-04-15 MED ORDER — SODIUM CHLORIDE 0.9 % IR SOLN
Status: DC | PRN
  Administered 2020-04-15: 3000 mL

## 2020-04-15 MED ORDER — PHENYLEPHRINE HCL-NACL 10-0.9 MG/250ML-% IV SOLN
INTRAVENOUS | Status: DC | PRN
  Administered 2020-04-15: 50 ug/min via INTRAVENOUS

## 2020-04-15 MED ORDER — OXYCODONE HCL 5 MG PO TABS
5.0000 mg | ORAL_TABLET | ORAL | Status: DC | PRN
  Administered 2020-04-16: 10 mg via ORAL
  Filled 2020-04-15 (×2): qty 2

## 2020-04-15 MED ORDER — POLYETHYLENE GLYCOL 3350 17 G PO PACK: 17.0000 g | PACK | Freq: Every day | ORAL | Status: DC | PRN

## 2020-04-15 MED ORDER — DICLOFENAC SODIUM 1 % EX GEL
1.0000 "application " | Freq: Every day | CUTANEOUS | Status: DC | PRN
  Filled 2020-04-15: qty 100

## 2020-04-15 MED ORDER — ONDANSETRON HCL 4 MG/2ML IJ SOLN
INTRAMUSCULAR | Status: DC | PRN
  Administered 2020-04-15: 4 mg via INTRAVENOUS

## 2020-04-15 MED ORDER — ACETAMINOPHEN 500 MG PO TABS
1000.0000 mg | ORAL_TABLET | Freq: Four times a day (QID) | ORAL | Status: AC
  Administered 2020-04-15 – 2020-04-16 (×4): 1000 mg via ORAL
  Filled 2020-04-15 (×5): qty 2

## 2020-04-15 MED ORDER — BUPIVACAINE HCL 0.25 % IJ SOLN
INTRAMUSCULAR | Status: DC | PRN
  Administered 2020-04-15: 30 mL

## 2020-04-15 MED ORDER — MIDAZOLAM HCL 5 MG/5ML IJ SOLN
INTRAMUSCULAR | Status: DC | PRN
  Administered 2020-04-15: .5 mg via INTRAVENOUS
  Administered 2020-04-15: 1 mg via INTRAVENOUS

## 2020-04-15 MED ORDER — DULOXETINE HCL 30 MG PO CPEP
30.0000 mg | ORAL_CAPSULE | Freq: Every day | ORAL | Status: DC
  Administered 2020-04-16: 30 mg via ORAL
  Filled 2020-04-15: qty 1

## 2020-04-15 MED ORDER — ACETAMINOPHEN 325 MG PO TABS
325.0000 mg | ORAL_TABLET | Freq: Four times a day (QID) | ORAL | Status: DC | PRN
  Administered 2020-04-16: 650 mg via ORAL
  Filled 2020-04-15: qty 2

## 2020-04-15 MED ORDER — HYDROMORPHONE HCL 1 MG/ML IJ SOLN
0.2500 mg | INTRAMUSCULAR | Status: DC | PRN
  Administered 2020-04-15: 0.5 mg via INTRAVENOUS

## 2020-04-15 MED ORDER — DULOXETINE HCL 60 MG PO CPEP
60.0000 mg | ORAL_CAPSULE | Freq: Every morning | ORAL | Status: DC
  Administered 2020-04-16: 60 mg via ORAL
  Filled 2020-04-15: qty 1

## 2020-04-15 MED ORDER — PROPOFOL 1000 MG/100ML IV EMUL
INTRAVENOUS | Status: AC
  Filled 2020-04-15: qty 100

## 2020-04-15 MED ORDER — PROPOFOL 10 MG/ML IV BOLUS
INTRAVENOUS | Status: AC
  Filled 2020-04-15: qty 20

## 2020-04-15 MED ORDER — DOCUSATE SODIUM 100 MG PO CAPS
100.0000 mg | ORAL_CAPSULE | Freq: Two times a day (BID) | ORAL | Status: DC
  Administered 2020-04-15 – 2020-04-16 (×3): 100 mg via ORAL
  Filled 2020-04-15 (×3): qty 1

## 2020-04-15 MED ORDER — DIPHENHYDRAMINE HCL 12.5 MG/5ML PO ELIX: 12.5000 mg | ORAL_SOLUTION | ORAL | Status: DC | PRN

## 2020-04-15 MED ORDER — HYDROMORPHONE HCL 1 MG/ML IJ SOLN
INTRAMUSCULAR | Status: AC
  Filled 2020-04-15: qty 1

## 2020-04-15 MED ORDER — 0.9 % SODIUM CHLORIDE (POUR BTL) OPTIME
TOPICAL | Status: DC | PRN
  Administered 2020-04-15: 1000 mL

## 2020-04-15 MED ORDER — ONDANSETRON HCL 4 MG/2ML IJ SOLN
INTRAMUSCULAR | Status: AC
  Filled 2020-04-15: qty 2

## 2020-04-15 MED ORDER — LACTATED RINGERS IV BOLUS
250.0000 mL | Freq: Once | INTRAVENOUS | Status: AC
  Administered 2020-04-15: 250 mL via INTRAVENOUS

## 2020-04-15 MED ORDER — PHENOL 1.4 % MT LIQD: 1.0000 | OROMUCOSAL | Status: DC | PRN

## 2020-04-15 MED ORDER — BUPROPION HCL ER (XL) 300 MG PO TB24
300.0000 mg | ORAL_TABLET | Freq: Every day | ORAL | Status: DC
  Administered 2020-04-16: 300 mg via ORAL
  Filled 2020-04-15: qty 1

## 2020-04-15 MED ORDER — POVIDONE-IODINE 10 % EX SWAB
2.0000 "application " | Freq: Once | CUTANEOUS | Status: AC
  Administered 2020-04-15: 2 via TOPICAL

## 2020-04-15 MED ORDER — CEFAZOLIN SODIUM-DEXTROSE 1-4 GM/50ML-% IV SOLN
1.0000 g | Freq: Four times a day (QID) | INTRAVENOUS | Status: AC
  Administered 2020-04-15: 1 g via INTRAVENOUS
  Filled 2020-04-15: qty 50

## 2020-04-15 MED ORDER — LACTATED RINGERS IV BOLUS: 500.0000 mL | Freq: Once | INTRAVENOUS | Status: DC

## 2020-04-15 MED ORDER — BUPIVACAINE-EPINEPHRINE (PF) 0.5% -1:200000 IJ SOLN
INTRAMUSCULAR | Status: DC | PRN
  Administered 2020-04-15: 20 mL via PERINEURAL

## 2020-04-15 MED ORDER — DARIFENACIN HYDROBROMIDE ER 7.5 MG PO TB24
7.5000 mg | ORAL_TABLET | Freq: Every day | ORAL | Status: DC
  Administered 2020-04-15 – 2020-04-16 (×2): 7.5 mg via ORAL
  Filled 2020-04-15 (×2): qty 1

## 2020-04-15 MED ORDER — CEFAZOLIN SODIUM-DEXTROSE 2-4 GM/100ML-% IV SOLN
2.0000 g | INTRAVENOUS | Status: AC
  Administered 2020-04-15: 2 g via INTRAVENOUS
  Filled 2020-04-15: qty 100

## 2020-04-15 MED ORDER — PHENYLEPHRINE HCL (PRESSORS) 10 MG/ML IV SOLN
INTRAVENOUS | Status: AC
  Filled 2020-04-15: qty 1

## 2020-04-15 MED ORDER — ALUM & MAG HYDROXIDE-SIMETH 200-200-20 MG/5ML PO SUSP: 30.0000 mL | ORAL | Status: DC | PRN

## 2020-04-15 MED ORDER — DEXAMETHASONE SODIUM PHOSPHATE 10 MG/ML IJ SOLN
10.0000 mg | Freq: Once | INTRAMUSCULAR | Status: AC
  Administered 2020-04-16: 10 mg via INTRAVENOUS
  Filled 2020-04-15: qty 1

## 2020-04-15 MED ORDER — DEXAMETHASONE SODIUM PHOSPHATE 10 MG/ML IJ SOLN
INTRAMUSCULAR | Status: AC
  Filled 2020-04-15: qty 1

## 2020-04-15 MED ORDER — BUPIVACAINE HCL 0.25 % IJ SOLN
INTRAMUSCULAR | Status: AC
  Filled 2020-04-15: qty 1

## 2020-04-15 MED ORDER — CHLORHEXIDINE GLUCONATE 0.12 % MT SOLN: 15.0000 mL | Freq: Once | OROMUCOSAL | Status: AC

## 2020-04-15 MED ORDER — OXYCODONE HCL 5 MG PO TABS
10.0000 mg | ORAL_TABLET | ORAL | Status: DC | PRN
Start: 2020-04-15 — End: 2020-04-16
  Administered 2020-04-15: 10 mg via ORAL
  Administered 2020-04-15: 15 mg via ORAL
  Filled 2020-04-15: qty 3

## 2020-04-15 MED ORDER — PROPOFOL 10 MG/ML IV BOLUS
INTRAVENOUS | Status: DC | PRN
  Administered 2020-04-15: 20 mg via INTRAVENOUS
  Administered 2020-04-15: 30 mg via INTRAVENOUS
  Administered 2020-04-15: 20 mg via INTRAVENOUS

## 2020-04-15 MED ORDER — ACETAMINOPHEN 500 MG PO TABS
1000.0000 mg | ORAL_TABLET | Freq: Once | ORAL | Status: AC
  Administered 2020-04-15: 1000 mg via ORAL
  Filled 2020-04-15: qty 2

## 2020-04-15 MED ORDER — KETOROLAC TROMETHAMINE 30 MG/ML IJ SOLN
INTRAMUSCULAR | Status: DC | PRN
  Administered 2020-04-15: 30 mg

## 2020-04-15 MED ORDER — METOCLOPRAMIDE HCL 5 MG PO TABS: 5.0000 mg | ORAL_TABLET | Freq: Three times a day (TID) | ORAL | Status: DC | PRN

## 2020-04-15 MED ORDER — MAGNESIUM CITRATE PO SOLN: 1.0000 | Freq: Once | ORAL | Status: DC | PRN

## 2020-04-15 MED ORDER — HYDROMORPHONE HCL 1 MG/ML IJ SOLN
0.5000 mg | INTRAMUSCULAR | Status: DC | PRN
Start: 2020-04-15 — End: 2020-04-16

## 2020-04-15 MED ORDER — PROPOFOL 500 MG/50ML IV EMUL
INTRAVENOUS | Status: DC | PRN
  Administered 2020-04-15: 100 ug/kg/min via INTRAVENOUS

## 2020-04-15 MED ORDER — ORAL CARE MOUTH RINSE
15.0000 mL | Freq: Once | OROMUCOSAL | Status: AC
  Administered 2020-04-15: 15 mL via OROMUCOSAL

## 2020-04-15 MED ORDER — ONDANSETRON HCL 4 MG/2ML IJ SOLN: 4.0000 mg | Freq: Four times a day (QID) | INTRAMUSCULAR | Status: DC | PRN

## 2020-04-15 MED ORDER — METOCLOPRAMIDE HCL 5 MG/ML IJ SOLN: 5.0000 mg | Freq: Three times a day (TID) | INTRAMUSCULAR | Status: DC | PRN

## 2020-04-15 MED ORDER — DEXAMETHASONE SODIUM PHOSPHATE 10 MG/ML IJ SOLN
INTRAMUSCULAR | Status: DC | PRN
  Administered 2020-04-15: 8 mg via INTRAVENOUS

## 2020-04-15 MED ORDER — ONDANSETRON HCL 4 MG PO TABS: 4.0000 mg | ORAL_TABLET | Freq: Four times a day (QID) | ORAL | Status: DC | PRN

## 2020-04-15 MED ORDER — LACTATED RINGERS IV SOLN: INTRAVENOUS | Status: DC

## 2020-04-15 SURGICAL SUPPLY — 63 items
BAG SPEC THK2 15X12 ZIP CLS (MISCELLANEOUS)
BAG ZIPLOCK 12X15 (MISCELLANEOUS) IMPLANT
BLADE SAG 18X100X1.27 (BLADE) ×2 IMPLANT
BLADE SAW SGTL 13X75X1.27 (BLADE) ×2 IMPLANT
BLADE SURG 15 STRL LF DISP TIS (BLADE) ×1 IMPLANT
BLADE SURG 15 STRL SS (BLADE) ×2
BNDG CMPR MED 10X6 ELC LF (GAUZE/BANDAGES/DRESSINGS) ×1
BNDG ELASTIC 6X10 VLCR STRL LF (GAUZE/BANDAGES/DRESSINGS) ×2 IMPLANT
BOWL SMART MIX CTS (DISPOSABLE) ×2 IMPLANT
BSPLAT TIB 5D D CMNT STM RT (Knees) ×1 IMPLANT
CEMENT HV SMART SET (Cement) ×4 IMPLANT
CLSR STERI-STRIP ANTIMIC 1/2X4 (GAUZE/BANDAGES/DRESSINGS) ×4 IMPLANT
COVER SURGICAL LIGHT HANDLE (MISCELLANEOUS) ×2 IMPLANT
COVER WAND RF STERILE (DRAPES) IMPLANT
CUFF TOURN SGL QUICK 34 (TOURNIQUET CUFF) ×2
CUFF TRNQT CYL 34X4.125X (TOURNIQUET CUFF) ×1 IMPLANT
DECANTER SPIKE VIAL GLASS SM (MISCELLANEOUS) IMPLANT
DRAPE U-SHAPE 47X51 STRL (DRAPES) ×2 IMPLANT
DRSG MEPILEX BORDER 4X12 (GAUZE/BANDAGES/DRESSINGS) ×2 IMPLANT
DRSG MEPILEX BORDER 4X8 (GAUZE/BANDAGES/DRESSINGS) ×1 IMPLANT
DRSG PAD ABDOMINAL 8X10 ST (GAUZE/BANDAGES/DRESSINGS) ×3 IMPLANT
DURAPREP 26ML APPLICATOR (WOUND CARE) ×4 IMPLANT
ELECT REM PT RETURN 15FT ADLT (MISCELLANEOUS) ×2 IMPLANT
FEMUR CEMT CR PERS STD SZ 5 RT (Knees) ×1 IMPLANT
GLOVE SRG 8 PF TXTR STRL LF DI (GLOVE) ×1 IMPLANT
GLOVE SURG ENC MOIS LTX SZ6.5 (GLOVE) ×2 IMPLANT
GLOVE SURG ENC MOIS LTX SZ7.5 (GLOVE) ×2 IMPLANT
GLOVE SURG UNDER POLY LF SZ7 (GLOVE) ×2 IMPLANT
GLOVE SURG UNDER POLY LF SZ8 (GLOVE) ×2
GOWN STRL REUS W/ TWL LRG LVL3 (GOWN DISPOSABLE) ×2 IMPLANT
GOWN STRL REUS W/TWL LRG LVL3 (GOWN DISPOSABLE) ×4
HANDPIECE INTERPULSE COAX TIP (DISPOSABLE) ×2
HDLS TROCR DRIL PIN KNEE 75 (PIN) ×2
HOLDER FOLEY CATH W/STRAP (MISCELLANEOUS) IMPLANT
HOOD PEEL AWAY FLYTE STAYCOOL (MISCELLANEOUS) ×6 IMPLANT
IMMOBILIZER KNEE 20 (SOFTGOODS) ×2
IMMOBILIZER KNEE 20 THIGH 36 (SOFTGOODS) ×1 IMPLANT
IMMOBILIZER KNEE 22 UNIV (SOFTGOODS) ×1 IMPLANT
IMPL ASF RT PSN 4-5/CD 10 (Joint) IMPLANT
IMPLANT ASF RT PSN 4-5/CD 10 (Joint) ×2 IMPLANT
KIT TURNOVER KIT A (KITS) ×2 IMPLANT
MANIFOLD NEPTUNE II (INSTRUMENTS) ×2 IMPLANT
NS IRRIG 1000ML POUR BTL (IV SOLUTION) ×2 IMPLANT
PACK ICE MAXI GEL EZY WRAP (MISCELLANEOUS) ×2 IMPLANT
PACK TOTAL KNEE CUSTOM (KITS) ×2 IMPLANT
PENCIL SMOKE EVACUATOR (MISCELLANEOUS) ×2 IMPLANT
PIN DRILL HDLS TROCAR 75 4PK (PIN) IMPLANT
PROTECTOR NERVE ULNAR (MISCELLANEOUS) ×2 IMPLANT
SCREW FEMALE HEX FIX 25X2.5 (ORTHOPEDIC DISPOSABLE SUPPLIES) ×1 IMPLANT
SET HNDPC FAN SPRY TIP SCT (DISPOSABLE) ×1 IMPLANT
SET PAD KNEE POSITIONER (MISCELLANEOUS) ×2 IMPLANT
STEM POLY PAT PLY 29M KNEE (Knees) ×1 IMPLANT
STEM TIBIA 5 DEG SZ D R KNEE (Knees) IMPLANT
SUT VIC AB 1 CT1 36 (SUTURE) ×4 IMPLANT
SUT VIC AB 2-0 CT1 27 (SUTURE) ×4
SUT VIC AB 2-0 CT1 TAPERPNT 27 (SUTURE) ×1 IMPLANT
SUT VIC AB 3-0 SH 8-18 (SUTURE) ×2 IMPLANT
TAPE STRIPS DRAPE STRL (GAUZE/BANDAGES/DRESSINGS) ×1 IMPLANT
TIBIA STEM 5 DEG SZ D R KNEE (Knees) ×2 IMPLANT
TRAY FOLEY MTR SLVR 16FR STAT (SET/KITS/TRAYS/PACK) ×2 IMPLANT
TUBE SUCTION HIGH CAP CLEAR NV (SUCTIONS) ×2 IMPLANT
WATER STERILE IRR 1000ML POUR (IV SOLUTION) ×4 IMPLANT
WRAP KNEE MAXI GEL POST OP (GAUZE/BANDAGES/DRESSINGS) ×1 IMPLANT

## 2020-04-15 NOTE — Discharge Instructions (Signed)
INSTRUCTIONS AFTER JOINT REPLACEMENT   o Remove items at home which could result in a fall. This includes throw rugs or furniture in walking pathways o ICE to the affected joint every three hours while awake for 30 minutes at a time, for at least the first 3-5 days, and then as needed for pain and swelling.  Continue to use ice for pain and swelling. You may notice swelling that will progress down to the foot and ankle.  This is normal after surgery.  Elevate your leg when you are not up walking on it.   o Continue to use the breathing machine you got in the hospital (incentive spirometer) which will help keep your temperature down.  It is common for your temperature to cycle up and down following surgery, especially at night when you are not up moving around and exerting yourself.  The breathing machine keeps your lungs expanded and your temperature down.   DIET:  As you were doing prior to hospitalization, we recommend a well-balanced diet.  DRESSING / WOUND CARE / SHOWERING  You may change your dressing 3-5 days after surgery.  Then change the dressing every day with sterile gauze.  Please use good hand washing techniques before changing the dressing.  Do not use any lotions or creams on the incision until instructed by your surgeon.  ACTIVITY  o Increase activity slowly as tolerated, but follow the weight bearing instructions below.   o No driving for 6 weeks or until further direction given by your physician.  You cannot drive while taking narcotics.  o No lifting or carrying greater than 10 lbs. until further directed by your surgeon. o Avoid periods of inactivity such as sitting longer than an hour when not asleep. This helps prevent blood clots.  o You may return to work once you are authorized by your doctor.     WEIGHT BEARING   Weight bearing as tolerated with assist device (walker, cane, etc) as directed, use it as long as suggested by your surgeon or therapist, typically at  least 4-6 weeks.   EXERCISES  Results after joint replacement surgery are often greatly improved when you follow the exercise, range of motion and muscle strengthening exercises prescribed by your doctor. Safety measures are also important to protect the joint from further injury. Any time any of these exercises cause you to have increased pain or swelling, decrease what you are doing until you are comfortable again and then slowly increase them. If you have problems or questions, call your caregiver or physical therapist for advice.   Rehabilitation is important following a joint replacement. After just a few days of immobilization, the muscles of the leg can become weakened and shrink (atrophy).  These exercises are designed to build up the tone and strength of the thigh and leg muscles and to improve motion. Often times heat used for twenty to thirty minutes before working out will loosen up your tissues and help with improving the range of motion but do not use heat for the first two weeks following surgery (sometimes heat can increase post-operative swelling).   These exercises can be done on a training (exercise) mat, on the floor, on a table or on a bed. Use whatever works the best and is most comfortable for you.    Use music or television while you are exercising so that the exercises are a pleasant break in your day. This will make your life better with the exercises acting as a break   in your routine that you can look forward to.   Perform all exercises about fifteen times, three times per day or as directed.  You should exercise both the operative leg and the other leg as well.  Exercises include:   . Quad Sets - Tighten up the muscle on the front of the thigh (Quad) and hold for 5-10 seconds.   . Straight Leg Raises - With your knee straight (if you were given a brace, keep it on), lift the leg to 60 degrees, hold for 3 seconds, and slowly lower the leg.  Perform this exercise against  resistance later as your leg gets stronger.  . Leg Slides: Lying on your back, slowly slide your foot toward your buttocks, bending your knee up off the floor (only go as far as is comfortable). Then slowly slide your foot back down until your leg is flat on the floor again.  . Angel Wings: Lying on your back spread your legs to the side as far apart as you can without causing discomfort.  . Hamstring Strength:  Lying on your back, push your heel against the floor with your leg straight by tightening up the muscles of your buttocks.  Repeat, but this time bend your knee to a comfortable angle, and push your heel against the floor.  You may put a pillow under the heel to make it more comfortable if necessary.   A rehabilitation program following joint replacement surgery can speed recovery and prevent re-injury in the future due to weakened muscles. Contact your doctor or a physical therapist for more information on knee rehabilitation.    CONSTIPATION  Constipation is defined medically as fewer than three stools per week and severe constipation as less than one stool per week.  Even if you have a regular bowel pattern at home, your normal regimen is likely to be disrupted due to multiple reasons following surgery.  Combination of anesthesia, postoperative narcotics, change in appetite and fluid intake all can affect your bowels.   YOU MUST use at least one of the following options; they are listed in order of increasing strength to get the job done.  They are all available over the counter, and you may need to use some, POSSIBLY even all of these options:    Drink plenty of fluids (prune juice may be helpful) and high fiber foods Colace 100 mg by mouth twice a day  Senokot for constipation as directed and as needed Dulcolax (bisacodyl), take with full glass of water  Miralax (polyethylene glycol) once or twice a day as needed.  If you have tried all these things and are unable to have a bowel  movement in the first 3-4 days after surgery call either your surgeon or your primary doctor.    If you experience loose stools or diarrhea, hold the medications until you stool forms back up.  If your symptoms do not get better within 1 week or if they get worse, check with your doctor.  If you experience "the worst abdominal pain ever" or develop nausea or vomiting, please contact the office immediately for further recommendations for treatment.   ITCHING:  If you experience itching with your medications, try taking only a single pain pill, or even half a pain pill at a time.  You can also use Benadryl over the counter for itching or also to help with sleep.   TED HOSE STOCKINGS:  Use stockings on both legs until for at least 2 weeks or as   directed by physician office. They may be removed at night for sleeping.  MEDICATIONS:  See your medication summary on the "After Visit Summary" that nursing will review with you.  You may have some home medications which will be placed on hold until you complete the course of blood thinner medication.  It is important for you to complete the blood thinner medication as prescribed.  PRECAUTIONS:  If you experience chest pain or shortness of breath - call 911 immediately for transfer to the hospital emergency department.   If you develop a fever greater that 101 F, purulent drainage from wound, increased redness or drainage from wound, foul odor from the wound/dressing, or calf pain - CONTACT YOUR SURGEON.                                                   FOLLOW-UP APPOINTMENTS:  If you do not already have a post-op appointment, please call the office for an appointment to be seen by your surgeon.  Guidelines for how soon to be seen are listed in your "After Visit Summary", but are typically between 1-4 weeks after surgery.  OTHER INSTRUCTIONS:   Knee Replacement:  Do not place pillow under knee, focus on keeping the knee straight while resting. CPM  instructions: 0-90 degrees, 2 hours in the morning, 2 hours in the afternoon, and 2 hours in the evening. Place foam block, curve side up under heel at all times except when in CPM or when walking.  DO NOT modify, tear, cut, or change the foam block in any way.  POST-OPERATIVE OPIOID TAPER INSTRUCTIONS: . It is important to wean off of your opioid medication as soon as possible. If you do not need pain medication after your surgery it is ok to stop day one. Marland Kitchen Opioids include: o Codeine, Hydrocodone(Norco, Vicodin), Oxycodone(Percocet, oxycontin) and hydromorphone amongst others.  . Long term and even short term use of opiods can cause: o Increased pain response o Dependence o Constipation o Depression o Respiratory depression o And more.  . Withdrawal symptoms can include o Flu like symptoms o Nausea, vomiting o And more . Techniques to manage these symptoms o Hydrate well o Eat regular healthy meals o Stay active o Use relaxation techniques(deep breathing, meditating, yoga) . Do Not substitute Alcohol to help with tapering . If you have been on opioids for less than two weeks and do not have pain than it is ok to stop all together.  . Plan to wean off of opioids o This plan should start within one week post op of your joint replacement. o Maintain the same interval or time between taking each dose and first decrease the dose.  o Cut the total daily intake of opioids by one tablet each day o Next start to increase the time between doses. o The last dose that should be eliminated is the evening dose.     MAKE SURE YOU:  . Understand these instructions.  . Get help right away if you are not doing well or get worse.    Thank you for letting us be a part of your medical care team.  It is a privilege we respect greatly.  We hope these instructions will help you stay on track for a fast and full recovery!

## 2020-04-15 NOTE — OR Nursing (Signed)
Dr. Dion Saucier injected Synvisc into left knee per patient request and permit.  Synvisc provided from office per Dr. Dion Saucier.

## 2020-04-15 NOTE — Anesthesia Procedure Notes (Signed)
Anesthesia Regional Block: Adductor canal block   Pre-Anesthetic Checklist: ,, timeout performed, Correct Patient, Correct Site, Correct Laterality, Correct Procedure, Correct Position, site marked, Risks and benefits discussed, pre-op evaluation,  At surgeon's request and post-op pain management  Laterality: Right  Prep: Maximum Sterile Barrier Precautions used, chloraprep       Needles:  Injection technique: Single-shot  Needle Type: Echogenic Stimulator Needle     Needle Length: 9cm  Needle Gauge: 21     Additional Needles:   Procedures:,,,, ultrasound used (permanent image in chart),,,,  Narrative:  Start time: 04/15/2020 7:02 AM End time: 04/15/2020 7:12 AM Injection made incrementally with aspirations every 5 mL.  Performed by: Personally  Anesthesiologist: Gaynelle Adu, MD  Additional Notes: 2% Lidocaine skin wheel.

## 2020-04-15 NOTE — Anesthesia Postprocedure Evaluation (Signed)
Anesthesia Post Note  Patient: Megan Nunez  Procedure(s) Performed: TOTAL KNEE ARTHROPLASTY (Right Knee)     Patient location during evaluation: PACU Anesthesia Type: Spinal and Regional Level of consciousness: oriented and awake and alert Pain management: pain level controlled Vital Signs Assessment: post-procedure vital signs reviewed and stable Respiratory status: spontaneous breathing and respiratory function stable Cardiovascular status: blood pressure returned to baseline and stable Postop Assessment: no headache, no backache, no apparent nausea or vomiting, spinal receding and patient able to bend at knees Anesthetic complications: no   No complications documented.  Last Vitals:  Vitals:   04/15/20 1130 04/15/20 1145  BP: (!) 108/57 (!) 122/56  Pulse: 71 69  Resp: 12 13  Temp:    SpO2: 99% 98%    Last Pain:  Vitals:   04/15/20 1145  TempSrc:   PainSc: 0-No pain                 Kellan Raffield,W. EDMOND

## 2020-04-15 NOTE — Transfer of Care (Signed)
Immediate Anesthesia Transfer of Care Note  Patient: Megan Nunez  Procedure(s) Performed: TOTAL KNEE ARTHROPLASTY (Right Knee)  Patient Location: PACU  Anesthesia Type:Spinal  Level of Consciousness: awake, alert , oriented and patient cooperative  Airway & Oxygen Therapy: Patient Spontanous Breathing and Patient connected to face mask oxygen  Post-op Assessment: Report given to RN and Post -op Vital signs reviewed and stable  Post vital signs: stable  Last Vitals:  Vitals Value Taken Time  BP 114/62 04/15/20 1038  Temp    Pulse 58 04/15/20 1044  Resp 14 04/15/20 1044  SpO2 100 % 04/15/20 1044  Vitals shown include unvalidated device data.  Last Pain:  Vitals:   04/15/20 0615  TempSrc: Oral         Complications: No complications documented.

## 2020-04-15 NOTE — Progress Notes (Signed)
The risks benefits and alternatives were discussed with the patient including but not limited to the risks of nonoperative treatment, versus surgical intervention including infection, bleeding, nerve injury, malunion, nonunion, the need for revision surgery, hardware prominence, hardware failure, the need for hardware removal, blood clots, cardiopulmonary complications, morbidity, mortality, among others, and they were willing to proceed.    Plan for right TKA and left knee injection.  Eulas Post, MD

## 2020-04-15 NOTE — Op Note (Addendum)
DATE OF SURGERY:  04/15/2020 TIME: 9:56 AM  PATIENT NAME:  Megan Nunez   AGE: 75 y.o.    PRE-OPERATIVE DIAGNOSIS: Bilateral knee osteoarthritis, right status post traumatic  POST-OPERATIVE DIAGNOSIS:  Same  PROCEDURE: RIGHT total Knee Arthroplasty, left knee intra-articular injection  SURGEON:  Eulas Post, MD   ASSISTANT:  Janine Ores, PA-C, present and scrubbed throughout the case, critical for assistance with exposure, retraction, instrumentation, and closure.   OPERATIVE IMPLANTS: Zimmer persona size 29 mm cemented 8 mm thick oval dome polyethylene patella, with a size 5 right cruciate retaining standard persona personalized knee femur, nickel free, with a size right 10 mm medial congruent vitamin E E tibia with a size D tibia.  PREOPERATIVE INDICATIONS:  Megan Nunez is a 75 y.o. year old female with end stage bone on bone degenerative arthritis of the knee who failed conservative treatment, including injections, antiinflammatories, activity modification, and assistive devices, and had significant impairment of their activities of daily living, and elected for Total Knee Arthroplasty.  She also had left knee osteoarthritis, and elected for viscosupplementation, which these medications are not available through the hospital, but require prior authorization and obtaining the injectable medication from the office.  The risks, benefits, and alternatives were discussed at length including but not limited to the risks of infection, bleeding, nerve injury, stiffness, blood clots, the need for revision surgery, cardiopulmonary complications, among others, and they were willing to proceed.  OPERATIVE FINDINGS AND UNIQUE ASPECTS OF THE CASE: The right knee had at least a 10 degree flexion contracture, with significant varus deformity, that was not correctable.  There was a large loose body in the posterior medial gutter, and there was depression and significant deformity of the lateral  tibial plateau, making the tibial articular cut more challenging to reference.  I cut the femur at 5 degrees valgus, 9 mm cut, and cut the tibia using the 2 mm reference off of the deficient medial side, although both sides were somewhat deficient, but I probably had at least 10 mm off of the lateral depending on where you measured, given the lateral deformity.  I was visualizing just the very tip of the fibular head through the lateral compartment.  I did sacrifice the PCL.  The patella measured 20 mm, and 14 mm after the cut.  ESTIMATED BLOOD LOSS: 150 mL  OPERATIVE DESCRIPTION:  The patient was brought to the operative room and placed in a supine position.  Spinal anesthesia was administered.  IV antibiotics were given.  Timeout was performed.    The left superolateral portal of the knee was prepped with alcohol and viscosupplementation using Synvisc was injected.  This had a smooth entry.  I then turned my attention to the right lower extremity.  The lower extremity was prepped and draped in the usual sterile fashion.  Time out was again performed.  The leg was elevated and exsanguinated and the tourniquet was inflated.  Anterior quadriceps tendon splitting approach was performed.  The patella was everted and osteophytes were removed.  The anterior horn of the medial and lateral meniscus was removed.   The patella was then measured, and cut with the saw.  The thickness before the cut was 20 and after the cut was 14.  A metal shield was used to protect the patella throughout the case.    The distal femur was opened with the drill and the intramedullary distal femoral cutting jig was utilized, set at 5 degrees resecting 9 mm  off the distal femur.  Care was taken to protect the collateral ligaments.  Then the extramedullary tibial cutting jig was utilized making the appropriate cut using the anterior tibial crest as a reference building in appropriate posterior slope.  Care was taken during  the cut to protect the medial and collateral ligaments.  The proximal tibia was removed along with the posterior horns of the menisci.  The PCL was sacrificed.    The extensor gap was measured and found to have adequate resection, measuring to a size 10.    The distal femoral sizing jig was applied, taking care to avoid notching.  This was set at 3 degrees of external rotation.  Then the 4-in-1 cutting jig was applied and the anterior and posterior femur was cut, along with the chamfer cuts.  All posterior osteophytes were removed.  The flexion gap was then measured and was symmetric with the extension gap.  The proximal tibia trial was floated, the rotation established, and then sized and prepared accordingly with the reamer and the punch, and then all components were trialed with the poly insert.  The knee was found to have excellent balance and full motion.    The above named components were then cemented into place and all excess cement was removed.  The real polyethylene implant was placed.  After the cement had cured I released the tourniquet and confirmed excellent hemostasis with no major posterior vessel injury.    The knee was easily taken through a range of motion and the patella tracked well and the knee irrigated copiously and the parapatellar and subcutaneous tissue closed with vicryl, and monocryl with steri strips for the skin.  The wounds were injected with marcaine, and dressed with sterile gauze and the patient was awakened and returned to the PACU in stable and satisfactory condition.  There were no complications.  Total tourniquet time was ~105 minutes.

## 2020-04-15 NOTE — Evaluation (Signed)
Physical Therapy Evaluation Patient Details Name: Megan Nunez MRN: [id number] DOB: 1945/04/21 Today's Date: 04/15/2020   History of Present Illness  75 yo female s/p R TKA 04/15/20  Clinical Impression  On eval POD 0, pt was Min assist for mobility.She walked ~20 feet with a RW. Moderate pain with activity. Will plan to follow and progress activity as tolerated.     Follow Up Recommendations Follow surgeon's recommendation for DC plan and follow-up therapies    Equipment Recommendations  None recommended by PT    Recommendations for Other Services       Precautions / Restrictions Precautions Precautions: Fall;Knee Required Braces or Orthoses: Knee Immobilizer - Right Knee Immobilizer - Right: On when out of bed or walking Restrictions Weight Bearing Restrictions: No Other Position/Activity Restrictions: WBAT      Mobility  Bed Mobility Overal bed mobility: Needs Assistance Bed Mobility: Supine to Sit;Sit to Supine     Supine to sit: Min assist;HOB elevated Sit to supine: Min assist;HOB elevated   General bed mobility comments: Assist for R LE    Transfers Overall transfer level: Needs assistance   Transfers: Sit to/from Stand Sit to Stand: Min assist         General transfer comment: Assist to rise, steady, control descent. Cues for safety, technique, hand/LE placement.  Ambulation/Gait Ambulation/Gait assistance: Min assist Gait Distance (Feet): 20 Feet Assistive device: Rolling walker (2 wheeled) Gait Pattern/deviations: Step-to pattern     General Gait Details: Cues for safety, technique, sequence. Assist to steady.  Stairs            Wheelchair Mobility    Modified Rankin (Stroke Patients Only)       Balance Overall balance assessment: Needs assistance         Standing balance support: Bilateral upper extremity supported Standing balance-Leahy Scale: Poor                               Pertinent Vitals/Pain Pain  Assessment: 0-10 Pain Score: 5  Pain Location: R knee Pain Descriptors / Indicators: Discomfort;Sore Pain Intervention(s): Monitored during session;Repositioned    Home Living Family/patient expects to be discharged to:: Private residence Living Arrangements: Spouse/significant other Available Help at Discharge: Family Type of Home: House Home Access: Level entry (but with an incline-not a ramp)   Entrance Stairs-Number of Steps:  Home Layout: Two level  Steps Inside: 2 (with a post to hold on to) Home Equipment: Shower seat;Bedside commode;Toilet riser;Walker - 2 wheels;Cane - single point      Prior Function Level of Independence: Independent               Hand Dominance        Extremity/Trunk Assessment   Upper Extremity Assessment Upper Extremity Assessment: Defer to OT evaluation    Lower Extremity Assessment Lower Extremity Assessment: Generalized weakness    Cervical / Trunk Assessment Cervical / Trunk Assessment: Normal  Communication   Communication: No difficulties  Cognition Arousal/Alertness: Awake/alert Behavior During Therapy: WFL for tasks assessed/performed Overall Cognitive Status: Within Functional Limits for tasks assessed                                        General Comments      Exercises     Assessment/Plan    PT Assessment Patient needs continued PT services  PT Problem List Decreased strength;Decreased range of motion;Decreased mobility;Decreased activity tolerance;Decreased balance;Decreased knowledge of use of DME;Pain       PT Treatment Interventions DME instruction;Gait training;Balance training;Therapeutic exercise;Functional mobility training;Therapeutic activities;Patient/family education    PT Goals (Current goals can be found in the Care Plan section)  Acute Rehab PT Goals Patient Stated Goal: less pain. PT Goal Formulation: With patient Time For Goal Achievement: 04/29/20 Potential to Achieve  Goals: Good    Frequency 7X/week   Barriers to discharge        Co-evaluation               AM-PAC PT "6 Clicks" Mobility  Outcome Measure Help needed turning from your back to your side while in a flat bed without using bedrails?: A Little Help needed moving from lying on your back to sitting on the side of a flat bed without using bedrails?: A Little Help needed moving to and from a bed to a chair (including a wheelchair)?: A Little Help needed standing up from a chair using your arms (e.g., wheelchair or bedside chair)?: A Little Help needed to walk in hospital room?: A Little Help needed climbing 3-5 steps with a railing? : A Little 6 Click Score: 18    End of Session Equipment Utilized During Treatment: Gait belt;Right knee immobilizer Activity Tolerance: Patient tolerated treatment well Patient left: in bed;with call bell/phone within reach;with bed alarm set;with family/visitor present        Time: 6967-8938 PT Time Calculation (min) (ACUTE ONLY): 31 min   Charges:   PT Evaluation $PT Eval Low Complexity: 1 Low PT Treatments $Gait Training: 8-22 mins         Faye Ramsay, PT Acute Rehabilitation  Office: [phone] Pager: [phone]

## 2020-04-15 NOTE — Anesthesia Procedure Notes (Signed)
Spinal  Patient location during procedure: OR Start time: 04/15/2020 7:42 AM End time: 04/15/2020 7:44 AM Reason for block: surgical anesthesia Staffing Performed: anesthesiologist  Anesthesiologist: Gaynelle Adu, MD Preanesthetic Checklist Completed: patient identified, IV checked, risks and benefits discussed, surgical consent, monitors and equipment checked, pre-op evaluation and timeout performed Spinal Block Patient position: sitting Prep: DuraPrep Patient monitoring: cardiac monitor, continuous pulse ox and blood pressure Approach: midline Location: L3-4 Injection technique: single-shot Needle Needle type: Pencan  Needle gauge: 24 G Needle length: 9 cm Assessment Sensory level: T8 Events: CSF return and second provider Additional Notes Functioning IV was confirmed and monitors were applied. Sterile prep and drape, including hand hygiene and sterile gloves were used. The patient was positioned and the spine was prepped. The skin was anesthetized with lidocaine.  Free flow of clear CSF was obtained prior to injecting local anesthetic into the CSF.  The spinal needle aspirated freely following injection.  The needle was carefully withdrawn.  The patient tolerated the procedure well. Spinal attempted by crna x 2 levels prior to my attempt.

## 2020-04-15 NOTE — Anesthesia Procedure Notes (Signed)
Procedure Name: MAC Date/Time: 04/15/2020 7:28 AM Performed by: Lissa Morales, CRNA Pre-anesthesia Checklist: Patient identified, Emergency Drugs available, Suction available and Patient being monitored Patient Re-evaluated:Patient Re-evaluated prior to induction Oxygen Delivery Method: Simple face mask Placement Confirmation: positive ETCO2

## 2020-04-16 ENCOUNTER — Encounter (HOSPITAL_COMMUNITY): Payer: Self-pay | Admitting: Orthopedic Surgery

## 2020-04-16 DIAGNOSIS — M1731 Unilateral post-traumatic osteoarthritis, right knee: Secondary | ICD-10-CM | POA: Diagnosis not present

## 2020-04-16 DIAGNOSIS — M170 Bilateral primary osteoarthritis of knee: Secondary | ICD-10-CM | POA: Diagnosis not present

## 2020-04-16 DIAGNOSIS — Z791 Long term (current) use of non-steroidal anti-inflammatories (NSAID): Secondary | ICD-10-CM | POA: Diagnosis not present

## 2020-04-16 DIAGNOSIS — Z91048 Other nonmedicinal substance allergy status: Secondary | ICD-10-CM | POA: Diagnosis not present

## 2020-04-16 DIAGNOSIS — M2341 Loose body in knee, right knee: Secondary | ICD-10-CM | POA: Diagnosis not present

## 2020-04-16 DIAGNOSIS — Z79899 Other long term (current) drug therapy: Secondary | ICD-10-CM | POA: Diagnosis not present

## 2020-04-16 DIAGNOSIS — M25761 Osteophyte, right knee: Secondary | ICD-10-CM | POA: Diagnosis not present

## 2020-04-16 LAB — CBC
HCT: 33.8 % — ABNORMAL LOW (ref 36.0–46.0)
Hemoglobin: 11 g/dL — ABNORMAL LOW (ref 12.0–15.0)
MCH: 30.2 pg (ref 26.0–34.0)
MCHC: 32.5 g/dL (ref 30.0–36.0)
MCV: 92.9 fL (ref 80.0–100.0)
Platelets: 153 10*3/uL (ref 150–400)
RBC: 3.64 MIL/uL — ABNORMAL LOW (ref 3.87–5.11)
RDW: 13.6 % (ref 11.5–15.5)
WBC: 14.2 10*3/uL — ABNORMAL HIGH (ref 4.0–10.5)
nRBC: 0 % (ref 0.0–0.2)

## 2020-04-16 LAB — BASIC METABOLIC PANEL
Anion gap: 7 (ref 5–15)
BUN: 15 mg/dL (ref 8–23)
CO2: 25 mmol/L (ref 22–32)
Calcium: 8.9 mg/dL (ref 8.9–10.3)
Chloride: 105 mmol/L (ref 98–111)
Creatinine, Ser: 0.69 mg/dL (ref 0.44–1.00)
GFR, Estimated: >60 mL/min (ref >60)
Glucose, Bld: 107 mg/dL — ABNORMAL HIGH (ref 70–99)
Potassium: 4.3 mmol/L (ref 3.5–5.1)
Sodium: 137 mmol/L (ref 135–145)

## 2020-04-16 MED ORDER — SENNA-DOCUSATE SODIUM 8.6-50 MG PO TABS: 2.0000 | ORAL_TABLET | Freq: Every day | ORAL | 1 refills | Status: DC

## 2020-04-16 MED ORDER — OXYCODONE HCL 5 MG PO TABS: 5.0000 mg | ORAL_TABLET | ORAL | 0 refills | Status: DC | PRN

## 2020-04-16 MED ORDER — ONDANSETRON HCL 4 MG PO TABS: 4.0000 mg | ORAL_TABLET | Freq: Three times a day (TID) | ORAL | 0 refills | Status: DC | PRN

## 2020-04-16 MED ORDER — ASPIRIN EC 325 MG PO TBEC: 325.0000 mg | DELAYED_RELEASE_TABLET | Freq: Two times a day (BID) | ORAL | 0 refills | Status: DC

## 2020-04-16 NOTE — Plan of Care (Signed)
Pt pain remaining at 3/10 with scheduled tylenol with no discomfort. Pt voiding with BSC and able to ambulate with a walker and stand by assist. Pt in good spirits and expresses understanding of treatment.

## 2020-04-16 NOTE — Progress Notes (Signed)
     Subjective: 1 Day Post-Op s/p Procedure(s): TOTAL KNEE ARTHROPLASTY   Patient is alert, oriented. Patient reports pain as mild at right knee.  Denies chest pain, SOB, Calf pain. No nausea/vomiting. No other complaints. Passing flatus this morning.   Objective:  PE: VITALS:   Vitals:   04/15/20 1915 04/15/20 2206 04/16/20 0210 04/16/20 0615  BP: (!) 141/56 (!) 154/124 (!) 126/55 (!) 118/53  Pulse: 72 70 68 72  Resp:  16 16 16   Temp: 97.9 F (36.6 C) 98.6 F (37 C) 98.2 F (36.8 C) 98.9 F (37.2 C)  TempSrc: Oral Oral Oral Oral  SpO2: 97% 96% 100% 97%  Weight:      Height:        ABD soft Sensation intact distally Intact pulses distally Dorsiflexion/Plantar flexion intact Incision: no drainage  LABS  Results for orders placed or performed during the hospital encounter of 04/15/20 (from the past 24 hour(s))  CBC     Status: Abnormal   Collection Time: 04/16/20  3:10 AM  Result Value Ref Range   WBC 14.2 (H) 4.0 - 10.5 K/uL   RBC 3.64 (L) 3.87 - 5.11 MIL/uL   Hemoglobin 11.0 (L) 12.0 - 15.0 g/dL   HCT 04/18/20 (L) 88.4 - 16.6 %   MCV 92.9 80.0 - 100.0 fL   MCH 30.2 26.0 - 34.0 pg   MCHC 32.5 30.0 - 36.0 g/dL   RDW 06.3 01.6 - 01.0 %   Platelets 153 150 - 400 K/uL   nRBC 0.0 0.0 - 0.2 %  Basic metabolic panel     Status: Abnormal   Collection Time: 04/16/20  3:10 AM  Result Value Ref Range   Sodium 137 135 - 145 mmol/L   Potassium 4.3 3.5 - 5.1 mmol/L   Chloride 105 98 - 111 mmol/L   CO2 25 22 - 32 mmol/L   Glucose, Bld 107 (H) 70 - 99 mg/dL   BUN 15 8 - 23 mg/dL   Creatinine, Ser 04/18/20 0.44 - 1.00 mg/dL   Calcium 8.9 8.9 - 3.55 mg/dL   GFR, Estimated 73.2 >20 mL/min   Anion gap 7 5 - 15    DG Knee Right Port  Result Date: 04/15/2020 CLINICAL DATA:  Status post right total knee replacement. EXAM: PORTABLE RIGHT KNEE - 1-2 VIEW COMPARISON:  None. FINDINGS: The left femoral and tibial components are well situated. Expected postoperative changes are noted  in the soft tissues anteriorly. IMPRESSION: Status post left total knee arthroplasty. Electronically Signed   By: 04/17/2020 M.D.   On: 04/15/2020 15:12    Assessment/Plan: Active Problems:   S/P total knee replacement, right  1 Day Post-Op s/p Procedure(s): TOTAL KNEE ARTHROPLASTY - doing well with minimal pain and swelling - all post op questions answered  Weightbearing: WBAT RLE Insicional and dressing care: Reinforce dressings as needed VTE prophylaxis: Aspirin 325mg  BID x 30 days Pain control: continue current regimen Follow - up plan: 2 weeks with Dr. 04/17/2020 Dispo: ok to discharge today when passes PT, patient is set up with HHPT through [facility]   Contact information:   Weekdays 8-5 Dion Saucier, PA-C [phone] A fter hours and holidays please check [email or web address] for group call information for [facility]  Janine Ores 04/16/2020, 8:24 AM

## 2020-04-16 NOTE — Plan of Care (Signed)
Patient discharged home in stable condition 

## 2020-04-16 NOTE — TOC Transition Note (Addendum)
Transition of Care [facility]) - CM/SW Discharge Note  Patient Details  Name: Megan Nunez MRN: [id number] Date of Birth: [birth year]  [facility] of [facility] CM/SW Contact:  Sherie Don, LCSW Phone Number: 04/16/2020, 11:52 AM  Clinical Narrative: Patient to discharge home today. CSW met with patient to confirm discharge plan. Patient will discharge home with HHPT through [facility] and then transition to [place] at [facility]. Patient in need of rolling walker; [employer] has order and will deliver to room. CSW notified Ronalee Belts with [facility] of discharge. TOC signing off.  Final next level of care: [place] Barriers to Discharge: No Barriers Identified  Patient Goals and CMS Choice Patient states their goals for this hospitalization and ongoing recovery are:: Discharge home with HHPT through [facility] and then transition to [place] CMS Medicare.gov Compare Post Acute Care list provided to:: Patient Choice offered to / list presented to : Patient  Discharge Plan and Services        DME Arranged: Walker rolling DME Agency: [place] [facility] Representative spoke with at DME Agency: Arranged before surgery HH Arranged: PT [place] Agency: [facility] [employer] [facility] [employer] Representative spoke with at [place]: [place] before surgery  Readmission Risk Interventions No flowsheet data found.

## 2020-04-16 NOTE — Hospital Course (Signed)
deb

## 2020-04-16 NOTE — Discharge Summary (Addendum)
Discharge Summary  Patient ID: Megan Nunez MRN: [id number] DOB/AGE: [birth year] 75 y.o.  Admit date: 04/15/2020 Discharge date: 04/16/2020  Admission Diagnoses:  Right knee post traumatic arthrosis  Discharge Diagnoses:  Active Problems:   S/P total knee replacement, right   Past Medical History:  Diagnosis Date  . Anxiety   . Arthritis   . Depression   . Family history of adverse reaction to anesthesia 2012   Mother has cardiac  arrest after surgery  . Hepatitis    high antibodies for hepatitis  . Obesity     Surgeries: Procedure(s): TOTAL KNEE ARTHROPLASTY on 04/15/2020   Consultants (if any):   Discharged Condition: Improved  Hospital Course: Megan Nunez is an 75 y.o. female who was admitted 04/15/2020 with a diagnosis of right knee post traumatic arthrosis and went to the operating room on 04/15/2020 and underwent the above named procedures.    She was given perioperative antibiotics:  Anti-infectives (From admission, onward)   Start     Dose/Rate Route Frequency Ordered Stop   04/15/20 1330  ceFAZolin (ANCEF) IVPB 1 g/50 mL premix        1 g 100 mL/hr over 30 Minutes Intravenous Every 6 hours 04/15/20 1302 04/15/20 1602   04/15/20 0600  ceFAZolin (ANCEF) IVPB 2g/100 mL premix        2 g 200 mL/hr over 30 Minutes Intravenous On call to O.R. 04/15/20 0556 04/15/20 0730    .  She was given sequential compression devices, early ambulation, and aspirin for DVT prophylaxis.  She benefited maximally from the hospital stay and there were no complications.    Recent vital signs:  Vitals:   04/16/20 0210 04/16/20 0615  BP: (!) 126/55 (!) 118/53  Pulse: 68 72  Resp: 16 16  Temp: 98.2 F (36.8 C) 98.9 F (37.2 C)  SpO2: 100% 97%    Recent laboratory studies:  Lab Results  Component Value Date   HGB 11.0 (L) 04/16/2020   HGB 13.9 04/07/2020   HGB 14.7 04/09/2019   Lab Results  Component Value Date   WBC 14.2 (H) 04/16/2020   PLT 153 04/16/2020   No  results found for: INR Lab Results  Component Value Date   NA 137 04/16/2020   K 4.3 04/16/2020   CL 105 04/16/2020   CO2 25 04/16/2020   BUN 15 04/16/2020   CREATININE 0.69 04/16/2020   GLUCOSE 107 (H) 04/16/2020    Discharge Medications:   Allergies as of 04/16/2020      Reactions   Nickel Itching, Rash      Medication List    STOP taking these medications   diclofenac Sodium 1 % Gel Commonly known as: VOLTAREN   Ibuprofen 200 MG Caps   naproxen 375 MG tablet Commonly known as: NAPROSYN   Turmeric Curcumin 500 MG Caps     TAKE these medications   acetaminophen 325 MG tablet Commonly known as: TYLENOL Take 650 mg by mouth every 6 (six) hours as needed for mild pain.   aspirin EC 325 MG tablet Take 1 tablet (325 mg total) by mouth 2 (two) times daily.   B-complex with vitamin C tablet Take 1 tablet by mouth daily.   Biotin 62563 MCG Tabs Take 10,000 mcg by mouth daily.   buPROPion 300 MG 24 hr tablet Commonly known as: WELLBUTRIN XL Take 1 tablet (300 mg total) by mouth daily.   DULoxetine 30 MG capsule Commonly known as: CYMBALTA TAKE ONE CAPSULE BY MOUTH  DAILY TAKE WITH 60 MG What changed:   how much to take  how to take this  when to take this  additional instructions   DULoxetine 60 MG capsule Commonly known as: CYMBALTA Take 1 capsule (60 mg total) by mouth every morning. What changed: Another medication with the same name was changed. Make sure you understand how and when to take each.   Ginkgo Biloba 120 MG Tabs Take 120 mg by mouth daily.   Glucosamine-Chondroitin 750-600 MG Chew Chew 2 tablets by mouth daily.   lisdexamfetamine 40 MG capsule Commonly known as: Vyvanse Take 1 capsule (40 mg total) by mouth every morning. Start taking on: April 21, 2020 What changed: Another medication with the same name was removed. Continue taking this medication, and follow the directions you see here.   multivitamin with minerals tablet Take 1  tablet by mouth daily.   ondansetron 4 MG tablet Commonly known as: Zofran Take 1 tablet (4 mg total) by mouth every 8 (eight) hours as needed for nausea or vomiting.   oxyCODONE 5 MG immediate release tablet Commonly known as: Roxicodone Take 1 tablet (5 mg total) by mouth every 4 (four) hours as needed for severe pain.   sennosides-docusate sodium 8.6-50 MG tablet Commonly known as: SENOKOT-S Take 2 tablets by mouth daily.   solifenacin 5 MG tablet Commonly known as: VESIcare Take 1 tablet (5 mg total) by mouth daily.   vitamin B-12 1000 MCG tablet Commonly known as: CYANOCOBALAMIN Take 1,000 mcg by mouth daily.   vitamin C 1000 MG tablet Take 1,000 mg by mouth daily.   Vitamin D 50 MCG (2000 UT) tablet Take 2,000 Units by mouth daily.       Diagnostic Studies: DG Knee Right Port  Result Date: 04/15/2020 CLINICAL DATA:  Status post right total knee replacement. EXAM: PORTABLE RIGHT KNEE - 1-2 VIEW COMPARISON:  None. FINDINGS: The left femoral and tibial components are well situated. Expected postoperative changes are noted in the soft tissues anteriorly. IMPRESSION: Status post left total knee arthroplasty. Electronically Signed   By: Lupita Raider M.D.   On: 04/15/2020 15:12    Disposition: Discharge disposition: 01-Home or Self Care          Follow-up Information    Teryl Lucy, MD. Go on 04/28/2020.   Specialty: Orthopedic Surgery Why: Your appointment has been scheduled for 11:15. Contact information: [address] [place] [address] [phone]        [facility] Follow up.   Specialty: Home Health Services Why: HHPT will provide 5 visits at your home prior to you starting outpatient physical therapy  Contact information: [address] [place] [address] [phone]        [facility]. Go on 04/28/2020.   Why: Your appointment is scheduled for 2:45  Contact information: [place]    [phone]               Signed: Armida Sans PA-C 04/16/2020, 9:00 AM

## 2020-04-16 NOTE — Progress Notes (Signed)
Physical Therapy Treatment Patient Details Name: Megan Nunez MRN: [id number] DOB: 06-Jan-1946 Today's Date: 04/16/2020    History of Present Illness 75 yo female s/p R TKA 04/15/20    PT Comments    POD # 1 pm session with spouse. General transfer comment: <25% VC's on proper hand placement and safety with turns.  Had spouse assist "hands on" with instruction on safe handling.  General Gait Details: decreased gait distance only because treatment session focused on stair training.  General stair comments: with spouse "hands on" assist and Therapist direction on safe handling, proper walker placement on stairs and proper sequencing LE. Instructed on HEP freq and use of ICE. Addressed all mobility questions, discussed appropriate activity.  Pt ready for D/C to home. Pt plans to return for her other knee.     Follow Up Recommendations  Follow surgeon's recommendation for DC plan and follow-up therapies     Equipment Recommendations  None recommended by PT    Recommendations for Other Services       Precautions / Restrictions Precautions Precautions: Fall;Knee Precaution Comments: instructed no pillow under knee Restrictions Weight Bearing Restrictions: No Other Position/Activity Restrictions: WBAT    Mobility  Bed Mobility               General bed mobility comments: OOB in recliner    Transfers Overall transfer level: Needs assistance Equipment used: Rolling walker (2 wheeled) Transfers: Sit to/from [employer] Sit to Stand: Supervision Stand pivot transfers: Supervision;Min guard       General transfer comment: <25% VC's on proper hand placement and safety with turns.  Had spouse assist "hands on" with instruction on safe handling  Ambulation/Gait Ambulation/Gait assistance: Supervision Gait Distance (Feet): 12 Feet Assistive device: Rolling walker (2 wheeled) Gait Pattern/deviations: Step-to pattern;Decreased stance time - right Gait  velocity: decreased   General Gait Details: decreased gait distance only because treatment session focused on stair training   Stairs Stairs: Yes Stairs assistance: Min assist Stair Management: No rails;Step to pattern;Forwards;With walker Number of Stairs: 2 General stair comments: with spouse "hands on" assist and Therapist direction on safe handling, proper walker placement on stairs and proper sequencing LE.   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        Pertinent Vitals/Pain Pain Assessment: 0-10 Pain Score: 4  Pain Location: R knee Pain Descriptors / Indicators: Discomfort;Sore;Operative site guarding Pain Intervention(s): Monitored during session;Premedicated before session;Repositioned;Ice applied    Home Living                      Prior Function            PT Goals (current goals can now be found in the care plan section) Progress  towards PT goals: Progressing toward goals    Frequency    7X/week      PT Plan Current plan remains appropriate    Co-evaluation              AM-PAC PT "6 Clicks" Mobility   Outcome Measure  Help needed turning from your back to your side while in a flat bed without using bedrails?: None Help needed moving from lying on your back to sitting on the side of a flat bed without using bedrails?: None Help needed moving to and from a bed to a chair (including a wheelchair)?: None Help needed standing up from a chair using your arms (e.g., wheelchair or bedside chair)?: None Help needed to  walk in hospital room?: None Help needed climbing 3-5 steps with a railing? : A Little 6 Click Score: 23    End of Session Equipment Utilized During Treatment: Gait belt Activity Tolerance: Patient tolerated treatment well Patient left: in chair;with call bell/phone within reach Nurse Communication: Mobility status (pt ready for D/C to home) PT Visit Diagnosis: Other abnormalities of gait and mobility (R26.89)     Time: 8938-1017 PT Time Calculation (min) (ACUTE ONLY): 24 min  Charges:  $Gait Training: 8-22 mins $Therapeutic Activity: 8-22 mins                     Felecia Shelling  PTA Acute  Rehabilitation Services Pager      [phone] Office      [phone]

## 2020-04-16 NOTE — Progress Notes (Signed)
Physical Therapy Treatment Patient Details Name: Megan Nunez MRN: [id number] DOB: May 19, 1945 Today's Date: 04/16/2020    History of Present Illness 75 yo female s/p R TKA 04/15/20    PT Comments    POD # 1 am session Pt OOB in recliner.  Assisted with amb in hallway, practiced 2 steps she has to enter home Then returned to room to perform some TE's following HEP handout.  Instructed on proper tech, freq as well as use of ICE.   Pt will need another PT session to practices stairs with spouse and complete HEP.    Follow Up Recommendations  Follow surgeon's recommendation for DC plan and follow-up therapies     Equipment Recommendations  None recommended by PT    Recommendations for Other Services       Precautions / Restrictions Precautions Precautions: Fall;Knee Precaution Comments: instructed no pillow under knee Restrictions Weight Bearing Restrictions: No Other Position/Activity Restrictions: WBAT    Mobility  Bed Mobility               General bed mobility comments: OOB in recliner    Transfers Overall transfer level: Needs assistance Equipment used: Rolling walker (2 wheeled) Transfers: Sit to/from [employer] Sit to Stand: Supervision Stand pivot transfers: Supervision;Min guard       General transfer comment: <25% VC's on proper hand placement and safety with turns  Ambulation/Gait Ambulation/Gait assistance: Supervision;Min guard Gait Distance (Feet): 45 Feet Assistive device: Rolling walker (2 wheeled) Gait Pattern/deviations: Step-to pattern;Decreased stance time - right Gait velocity: decreased   General Gait Details: 25% VC's on proper sequencing and safety with turns .  Tolerated a functional distance.   Stairs Stairs: Yes Stairs assistance: Min assist Stair Management: No rails;Step to pattern;Forwards;With walker Number of Stairs: 2 General stair comments: with walker up forward with 25% VC's on proper walker  placement and proper sequencing as well as care giver utilization.  Will repeat stair training this afternoon when spouse arrives.   Wheelchair Mobility    Modified Rankin (Stroke Patients Only)       Balance                                            Cognition Arousal/Alertness: Awake/alert Behavior During Therapy: WFL for tasks assessed/performed Overall Cognitive Status: Within Functional Limits for tasks assessed                                 General Comments: AxO x 3 very pleasant      Exercises   Total Knee Replacement TE's following HEP handout 10 reps B LE ankle pumps 05 reps towel squeezes 05 reps knee presses 05 reps heel slides  05 reps SAQ's 05 reps SLR's 05 reps ABD Educated on use of gait belt to assist with TE's Followed by ICE     General Comments        Pertinent Vitals/Pain Pain Assessment: 0-10 Pain Score: 3  Pain Location: R knee Pain Descriptors / Indicators: Discomfort;Sore Pain Intervention(s): Monitored during session;Premedicated before session;Repositioned;Ice applied    Home Living                      Prior Function            PT Goals (current goals can  now be found in the care plan section) Progress towards PT goals: Progressing toward goals    Frequency           PT Plan      Co-evaluation              AM-PAC PT "6 Clicks" Mobility   Outcome Measure  Help needed turning from your back to your side while in a flat bed without using bedrails?: A Little Help needed moving from lying on your back to sitting on the side of a flat bed without using bedrails?: A Little Help needed moving to and from a bed to a chair (including a wheelchair)?: A Little Help needed standing up from a chair using your arms (e.g., wheelchair or bedside chair)?: A Little Help needed to walk in hospital room?: A Little Help needed climbing 3-5 steps with a railing? : A Little 6 Click Score:  18    End of Session Equipment Utilized During Treatment: Gait belt Activity Tolerance: Patient tolerated treatment well Patient left: in chair;with call bell/phone within reach Nurse Communication: Mobility status PT Visit Diagnosis: Other abnormalities of gait and mobility (R26.89)     Time: 5329-9242 PT Time Calculation (min) (ACUTE ONLY): 24 min  Charges:  $Gait Training: 8-22 mins $Therapeutic Exercise: 8-22 mins                     Felecia Shelling  PTA Acute  Rehabilitation Services Pager      [phone] Office      [phone]

## 2020-04-17 DIAGNOSIS — Z96651 Presence of right artificial knee joint: Secondary | ICD-10-CM | POA: Diagnosis not present

## 2020-04-17 DIAGNOSIS — Z471 Aftercare following joint replacement surgery: Secondary | ICD-10-CM | POA: Diagnosis not present

## 2020-04-17 DIAGNOSIS — Z9181 History of falling: Secondary | ICD-10-CM | POA: Diagnosis not present

## 2020-04-17 DIAGNOSIS — Z7982 Long term (current) use of aspirin: Secondary | ICD-10-CM | POA: Diagnosis not present

## 2020-04-17 DIAGNOSIS — F32A Depression, unspecified: Secondary | ICD-10-CM | POA: Diagnosis not present

## 2020-04-17 DIAGNOSIS — M1711 Unilateral primary osteoarthritis, right knee: Secondary | ICD-10-CM | POA: Diagnosis not present

## 2020-04-18 DIAGNOSIS — Z9181 History of falling: Secondary | ICD-10-CM | POA: Diagnosis not present

## 2020-04-18 DIAGNOSIS — F32A Depression, unspecified: Secondary | ICD-10-CM | POA: Diagnosis not present

## 2020-04-18 DIAGNOSIS — Z471 Aftercare following joint replacement surgery: Secondary | ICD-10-CM | POA: Diagnosis not present

## 2020-04-18 DIAGNOSIS — Z7982 Long term (current) use of aspirin: Secondary | ICD-10-CM | POA: Diagnosis not present

## 2020-04-18 DIAGNOSIS — Z96651 Presence of right artificial knee joint: Secondary | ICD-10-CM | POA: Diagnosis not present

## 2020-04-21 DIAGNOSIS — Z9181 History of falling: Secondary | ICD-10-CM | POA: Diagnosis not present

## 2020-04-21 DIAGNOSIS — F32A Depression, unspecified: Secondary | ICD-10-CM | POA: Diagnosis not present

## 2020-04-21 DIAGNOSIS — Z7982 Long term (current) use of aspirin: Secondary | ICD-10-CM | POA: Diagnosis not present

## 2020-04-21 DIAGNOSIS — Z96651 Presence of right artificial knee joint: Secondary | ICD-10-CM | POA: Diagnosis not present

## 2020-04-21 DIAGNOSIS — M1711 Unilateral primary osteoarthritis, right knee: Secondary | ICD-10-CM | POA: Diagnosis not present

## 2020-04-21 DIAGNOSIS — Z471 Aftercare following joint replacement surgery: Secondary | ICD-10-CM | POA: Diagnosis not present

## 2020-04-21 DIAGNOSIS — M1712 Unilateral primary osteoarthritis, left knee: Secondary | ICD-10-CM | POA: Diagnosis not present

## 2020-04-23 DIAGNOSIS — Z7982 Long term (current) use of aspirin: Secondary | ICD-10-CM | POA: Diagnosis not present

## 2020-04-23 DIAGNOSIS — Z9181 History of falling: Secondary | ICD-10-CM | POA: Diagnosis not present

## 2020-04-23 DIAGNOSIS — Z96651 Presence of right artificial knee joint: Secondary | ICD-10-CM | POA: Diagnosis not present

## 2020-04-23 DIAGNOSIS — F32A Depression, unspecified: Secondary | ICD-10-CM | POA: Diagnosis not present

## 2020-04-23 DIAGNOSIS — Z471 Aftercare following joint replacement surgery: Secondary | ICD-10-CM | POA: Diagnosis not present

## 2020-04-25 DIAGNOSIS — Z9181 History of falling: Secondary | ICD-10-CM | POA: Diagnosis not present

## 2020-04-25 DIAGNOSIS — F32A Depression, unspecified: Secondary | ICD-10-CM | POA: Diagnosis not present

## 2020-04-25 DIAGNOSIS — Z471 Aftercare following joint replacement surgery: Secondary | ICD-10-CM | POA: Diagnosis not present

## 2020-04-25 DIAGNOSIS — Z96651 Presence of right artificial knee joint: Secondary | ICD-10-CM | POA: Diagnosis not present

## 2020-04-25 DIAGNOSIS — Z7982 Long term (current) use of aspirin: Secondary | ICD-10-CM | POA: Diagnosis not present

## 2020-04-28 ENCOUNTER — Ambulatory Visit: Payer: Medicare Other

## 2020-04-28 DIAGNOSIS — M1711 Unilateral primary osteoarthritis, right knee: Secondary | ICD-10-CM | POA: Diagnosis not present

## 2020-04-29 ENCOUNTER — Ambulatory Visit (HOSPITAL_BASED_OUTPATIENT_CLINIC_OR_DEPARTMENT_OTHER): Payer: Medicare Other | Attending: Orthopedic Surgery | Admitting: Physical Therapy

## 2020-04-29 ENCOUNTER — Other Ambulatory Visit: Payer: Self-pay

## 2020-04-29 DIAGNOSIS — G8929 Other chronic pain: Secondary | ICD-10-CM | POA: Insufficient documentation

## 2020-04-29 DIAGNOSIS — M25661 Stiffness of right knee, not elsewhere classified: Secondary | ICD-10-CM | POA: Diagnosis not present

## 2020-04-29 DIAGNOSIS — M25662 Stiffness of left knee, not elsewhere classified: Secondary | ICD-10-CM | POA: Insufficient documentation

## 2020-04-29 DIAGNOSIS — M25561 Pain in right knee: Secondary | ICD-10-CM | POA: Insufficient documentation

## 2020-04-29 DIAGNOSIS — Z96651 Presence of right artificial knee joint: Secondary | ICD-10-CM | POA: Insufficient documentation

## 2020-04-29 DIAGNOSIS — M25562 Pain in left knee: Secondary | ICD-10-CM | POA: Insufficient documentation

## 2020-04-29 DIAGNOSIS — M6281 Muscle weakness (generalized): Secondary | ICD-10-CM | POA: Insufficient documentation

## 2020-04-29 NOTE — Patient Instructions (Signed)
Access Code: [email or web address] URL: [email or web address] Date: 04/29/2020 Prepared by: Vernon Prey April Kirstie Peri  Exercises Supine Active Straight Leg Raise - 1 x daily - 7 x weekly - 3 sets - 10 reps Sidelying Hip Abduction - 1 x daily - 7 x weekly - 3 sets - 10 reps Prone Hip Extension - 1 x daily - 7 x weekly - 3 sets - 10 reps Prone Knee Flexion - 1 x daily - 7 x weekly - 3 sets - 10 reps Seated Hamstring Curls with Resistance - 1 x daily - 7 x weekly - 2 sets - 10 reps Sitting Knee Extension with Resistance - 1 x daily - 7 x weekly - 2 sets - 10 reps Prone Quadriceps Stretch with Strap - 1 x daily - 7 x weekly - 1 sets - 10 reps - 5 sec hold

## 2020-04-29 NOTE — Therapy (Signed)
[facility] [address] [place], [place], [address] Phone: [phone]   Fax:  [phone]  Physical Therapy Evaluation  Patient Details  Name: Megan Nunez MRN: [id number] Date of Birth: [birth year] Referring Provider (PT): Megan Lucy, MD   Encounter Date: 04/29/2020   PT End of Session - 04/29/20 1531    Visit Number 1    Number of Visits 17    Date for PT Re-Evaluation 06/24/20    Authorization Type UHC Medicare    PT Start Time 1438    PT Stop Time 1520    PT Time Calculation (min) 42 min    Activity Tolerance Patient tolerated treatment well    Behavior During Therapy [facility] for tasks assessed/performed           Past Medical History:  Diagnosis Date  . Anxiety   . Arthritis   . Depression   . Family history of adverse reaction to anesthesia 2012   Mother has cardiac  arrest after surgery  . Hepatitis    high antibodies for hepatitis  . Obesity     Past Surgical History:  Procedure Laterality Date  . LAPAROSCOPIC TUBAL LIGATION  1985  . TOTAL KNEE ARTHROPLASTY Right 04/15/2020   Procedure: TOTAL KNEE ARTHROPLASTY;  Surgeon: Megan Lucy, MD;  Location: [facility];  Service: Orthopedics;  Laterality: Right;  . WISDOM TOOTH EXTRACTION     age 20    There were no vitals filed for this visit.    Subjective Assessment - 04/29/20 1526    Subjective Pt reports she's been doing well after her surgery on 04/15/20. She has been doing her exercises daily.    Limitations Walking    How long can you sit comfortably? n/a    How long can you stand comfortably? This feels the worse. ~5 min before needing to sit.    How long can you walk comfortably? ~4 blocks was the limit    Patient Stated Goals Improve R LE knee strength, walking and standing endurance    Currently in Pain? Yes    Pain Score 2     Pain Location Knee    Pain Orientation Right    Pain Descriptors / Indicators Aching    Pain Type Surgical pain     Pain Frequency Constant    Aggravating Factors  Increased time in standing    Effect of Pain on Daily Activities Limited standing for home tasks and walking              [facility] PT Assessment - 04/29/20 0001      Assessment   Medical Diagnosis R and L knee pain    Referring Provider (PT) Megan Lucy, MD    Onset Date/Surgical Date 04/15/20    Prior Therapy None      Precautions   Precautions None      Restrictions   Weight Bearing Restrictions No      Balance Screen   Has the patient fallen in the past 6 months No      Home Environment   Living Environment Private residence    Living Arrangements Spouse/significant other    Available Help at Discharge Family    Type of Home House    Home Access Level entry    Home Layout Multi-level    Alternate Level Stairs-Number of Steps 2   Big steps   Alternate Level Stairs-Rails None    Home Equipment Walker - 2 wheels  Prior Function   Level of Independence Independent      Observation/Other Assessments   Focus on Therapeutic Outcomes (FOTO)  n/a      AROM   Right Knee Extension -2    Right Knee Flexion 96      PROM   Right/Left Knee Right    Right Knee Extension 0    Right Knee Flexion 105      Strength   Overall Strength Comments Strength grossly WFL; L quad appears inhibited due to pain this session    Strength Assessment Site Hip    Right/Left Hip Right    Right Hip Flexion 3+/5    Right Hip Extension 4-/5    Right Hip ABduction 4-/5    Right/Left Knee Right    Right Knee Flexion 5/5    Right Knee Extension 4+/5      Palpation   Patella mobility WFL      Ambulation/Gait   Ambulation Distance (Feet) 150 Feet    Assistive device None    Gait Pattern Step-through pattern;Decreased stance time - left;Decreased weight shift to left;Antalgic   L knee varus   Ambulation Surface Level;Indoor                      Objective measurements completed on examination: See above findings.                  PT Short Term Goals - 04/29/20 1538      PT SHORT TERM GOAL #1   Title Pt will be independent with HEP    Time 4    Period Weeks    Status New    Target Date 05/27/20      PT SHORT TERM GOAL #2   Title Pt will be able to walk with normal reciprocal gait pattern    Baseline Mildly antalgic    Time 4    Period Days    Status New    Target Date 05/27/20      PT SHORT TERM GOAL #3   Title Pt will be able to demo R knee AROM of 0 to >105 deg    Baseline 2 to 96 deg    Time 4    Period Weeks    Status New    Target Date 05/27/20      PT SHORT TERM GOAL #4   Title --    Baseline --    Time --    Period --    Status --    Target Date --             PT Long Term Goals - 04/29/20 1540      PT LONG TERM GOAL #1   Title Pt will be independent with final HEP    Time 8    Period Weeks    Status New    Target Date 06/24/20      PT LONG TERM GOAL #2   Title Pt will be able to perform 5x STS in <13 sec to demo increased functional strength and balance    Time 8    Period Weeks    Status New    Target Date 06/24/20      PT LONG TERM GOAL #3   Title Pt will be able to perform steps at home with normal reciprocal pattern and no hand rail    Baseline Needs hand rail due to steep steps at home    Time 8  Period Weeks    Status New    Target Date 06/24/20      PT LONG TERM GOAL #4   Title Pt will be able to tolerate standing at least 15 minutes for improved home and community mobility    Baseline Prior to TKA, only able to tolerate ~5 min    Time 8    Period Weeks    Status New    Target Date 06/24/20                  Plan - 04/29/20 1532    Clinical Impression Statement Megan Nunez is a 75 y/o F presenting back to OPPT s/p R TKA on 04/15/20. On assessment, pt with R knee ROM of 0-105 PROM and 0-96 deg AROM. Pt limited due to pain, decreased strength, edema, and endurance affecting standing, walking, and daily activity. Pt demos  ~5 deg extensor lag with SLR. Pt is doing very well post-op and able to amb without RW or cane at this time. Pt would benefit from PT to continue to improve her ROM, strength, and balance after surgery for home and community tasks. Updated pt's exercises accordingly.    Personal Factors and Comorbidities Age;Fitness;Past/Current Experience;Time since onset of injury/illness/exacerbation    Examination-Activity Limitations Transfers;Locomotion Level;Stairs;Stand;Hygiene/Grooming    Examination-Participation Restrictions Community Activity;Driving;Cleaning    Stability/Clinical Decision Making Stable/Uncomplicated    Clinical Decision Making Low    Rehab Potential Good    PT Frequency 2x / week   Pt to return post surgery after HHPT   PT Duration 8 weeks    PT Treatment/Interventions ADLs/Self Care Home Management;Therapeutic exercise;Aquatic Therapy;Cryotherapy;Iontophoresis 4mg /ml Dexamethasone;Moist Heat;Ultrasound;DME Instruction;Electrical Stimulation;Gait training;Stair training;Functional mobility training;Therapeutic activities;Balance training;Neuromuscular re-education;Manual techniques;Patient/family education;Orthotic Fit/Training;Scar mobilization;Dry needling;Energy conservation;Taping;Vasopneumatic Device    PT Next Visit Plan Assess response to updated HEP. Continue hip, quad, and core strengthening. Work on . Assess 5x STS and balance.    PT Home Exercise Plan Access Code Animator    Consulted and Agree with Plan of Care Patient;Family member/caregiver    Family Member Consulted Husband           Patient will benefit from skilled therapeutic intervention in order to improve the following deficits and impairments:  Abnormal gait,Increased fascial restricitons,Pain,Decreased mobility,Decreased activity tolerance,Decreased range of motion,Decreased strength,Hypomobility,Decreased balance,Difficulty walking,Increased edema,Decreased endurance,Impaired flexibility,Decreased  scar mobility  Visit Diagnosis: Status post total right knee replacement  Muscle weakness (generalized)  Chronic pain of left knee  Chronic pain of right knee  Stiffness of right knee, not elsewhere classified  Stiffness of left knee, not elsewhere classified     Problem List Patient Active Problem List   Diagnosis Date Noted  . S/P total knee replacement, right 04/15/2020  . Osteopenia 04/10/2020  . Major depressive disorder, recurrent episode, mild (HCC) 10/31/2017  . ADHD, predominantly inattentive type 10/31/2017  . Primary osteoarthritis of right knee 09/09/2016  . Primary osteoarthritis, right shoulder 09/09/2016  . Arthritis 07/25/2012  . Tinnitus 07/25/2012  . Mixed incontinence urge and stress 07/25/2012    [facility] April Ma L Jordon Bourquin PT, DPT 04/29/2020, 3:43 PM  [facility] [address] [place], [place], [place] Phone: [phone]   Fax:  [phone]  Name: Megan Nunez MRN: [name] Date of Birth: [birth year]

## 2020-05-01 ENCOUNTER — Ambulatory Visit (HOSPITAL_BASED_OUTPATIENT_CLINIC_OR_DEPARTMENT_OTHER): Payer: Medicare Other | Admitting: Physical Therapy

## 2020-05-01 ENCOUNTER — Other Ambulatory Visit: Payer: Self-pay

## 2020-05-01 DIAGNOSIS — Z96651 Presence of right artificial knee joint: Secondary | ICD-10-CM

## 2020-05-01 DIAGNOSIS — M25661 Stiffness of right knee, not elsewhere classified: Secondary | ICD-10-CM

## 2020-05-01 DIAGNOSIS — G8929 Other chronic pain: Secondary | ICD-10-CM | POA: Diagnosis not present

## 2020-05-01 DIAGNOSIS — M25561 Pain in right knee: Secondary | ICD-10-CM

## 2020-05-01 DIAGNOSIS — M6281 Muscle weakness (generalized): Secondary | ICD-10-CM | POA: Diagnosis not present

## 2020-05-01 DIAGNOSIS — M25662 Stiffness of left knee, not elsewhere classified: Secondary | ICD-10-CM | POA: Diagnosis not present

## 2020-05-01 DIAGNOSIS — M25562 Pain in left knee: Secondary | ICD-10-CM | POA: Diagnosis not present

## 2020-05-01 NOTE — Therapy (Signed)
[facility] [address] [place], [place], [address] Phone: [phone]   Fax:  [phone]  Physical Therapy Treatment  Patient Details  Name: Megan Nunez MRN: [id number] Date of Birth: 06-28-1945 Referring Provider (PT): Teryl Lucy, MD   Encounter Date: 05/01/2020   PT End of Session - 05/01/20 1503    Visit Number 2    Number of Visits 17    Date for PT Re-Evaluation 06/24/20    Authorization Type UHC Medicare    PT Start Time 1445    PT Stop Time 1530    PT Time Calculation (min) 45 min    Activity Tolerance Patient tolerated treatment well    Behavior During Therapy [facility] for tasks assessed/performed           Past Medical History:  Diagnosis Date  . Anxiety   . Arthritis   . Depression   . Family history of adverse reaction to anesthesia 2012   Mother has cardiac  arrest after surgery  . Hepatitis    high antibodies for hepatitis  . Obesity     Past Surgical History:  Procedure Laterality Date  . LAPAROSCOPIC TUBAL LIGATION  1985  . TOTAL KNEE ARTHROPLASTY Right 04/15/2020   Procedure: TOTAL KNEE ARTHROPLASTY;  Surgeon: Teryl Lucy, MD;  Location: [facility];  Service: Orthopedics;  Laterality: Right;  . WISDOM TOOTH EXTRACTION     age 75    There were no vitals filed for this visit.   Subjective Assessment - 05/01/20 1450    Subjective Pt states she tried to stop taking her hydrocodone. Pt reports she felt very fatigued after her therapy session.    Limitations Walking    How long can you sit comfortably? n/a    How long can you stand comfortably? This feels the worse. ~5 min before needing to sit.    How long can you walk comfortably? ~4 blocks was the limit    Patient Stated Goals Improve R LE knee strength, walking and standing endurance    Currently in Pain? Yes    Pain Score 3     Pain Location Knee              [facility] PT Assessment - 05/01/20 0001      Functional Tests   Functional  tests Sit to Stand      Sit to Stand   Comments 5x STS 15 sec                         [facility] Adult PT Treatment/Exercise - 05/01/20 0001      Exercises   Exercises Knee/Hip      Knee/Hip Exercises: Aerobic   Other Aerobic Sci-fit x4 min for knee ROM      Knee/Hip Exercises: Seated   Long Arc Quad Strengthening;2 sets;10 reps    Hamstring Curl Strengthening;Right;Left;2 sets;10 reps      Knee/Hip Exercises: Supine   Straight Leg Raises Strengthening;Right;2 sets;10 reps      Knee/Hip Exercises: Sidelying   Hip ABduction Strengthening;Right;2 sets;10 reps      Knee/Hip Exercises: Prone   Hip Extension Strengthening;Right;Left;2 sets;10 reps      Modalities   Modalities Vasopneumatic      Vasopneumatic   Number Minutes Vasopneumatic  10 minutes    Vasopnuematic Location  Knee    Vasopneumatic Pressure Medium    Vasopneumatic Temperature  34  PT Short Term Goals - 04/29/20 1538      PT SHORT TERM GOAL #1   Title Pt will be independent with HEP    Time 4    Period Weeks    Status New    Target Date 05/27/20      PT SHORT TERM GOAL #2   Title Pt will be able to walk with normal reciprocal gait pattern    Baseline Mildly antalgic    Time 4    Period Days    Status New    Target Date 05/27/20      PT SHORT TERM GOAL #3   Title Pt will be able to demo R knee AROM of 0 to >105 deg    Baseline 2 to 96 deg    Time 4    Period Weeks    Status New    Target Date 05/27/20      PT SHORT TERM GOAL #4   Title --    Baseline --    Time --    Period --    Status --    Target Date --             PT Long Term Goals - 04/29/20 1540      PT LONG TERM GOAL #1   Title Pt will be independent with final HEP    Time 8    Period Weeks    Status New    Target Date 06/24/20      PT LONG TERM GOAL #2   Title Pt will be able to perform 5x STS in <13 sec to demo increased functional strength and balance    Time 8    Period  Weeks    Status New    Target Date 06/24/20      PT LONG TERM GOAL #3   Title Pt will be able to perform steps at home with normal reciprocal pattern and no hand rail    Baseline Needs hand rail due to steep steps at home    Time 8    Period Weeks    Status New    Target Date 06/24/20      PT LONG TERM GOAL #4   Title Pt will be able to tolerate standing at least 15 minutes for improved home and community mobility    Baseline Prior to TKA, only able to tolerate ~5 min    Time 8    Period Weeks    Status New    Target Date 06/24/20                 Plan - 05/01/20 1502    Clinical Impression Statement Treatment focused on reviewing HEP and hip/quad strengthening. Worked on knee ROM.    Personal Factors and Comorbidities Age;Fitness;Past/Current Experience;Time since onset of injury/illness/exacerbation    Examination-Activity Limitations Transfers;Locomotion Level;Stairs;Stand;Hygiene/Grooming    Examination-Participation Restrictions Community Activity;Driving;Cleaning    Stability/Clinical Decision Making Stable/Uncomplicated    Rehab Potential Good    PT Frequency 2x / week   Pt to return post surgery after HHPT   PT Duration 8 weeks    PT Treatment/Interventions ADLs/Self Care Home Management;Therapeutic exercise;Aquatic Therapy;Cryotherapy;Iontophoresis 4mg /ml Dexamethasone;Moist Heat;Ultrasound;DME Instruction;Electrical Stimulation;Gait training;Stair training;Functional mobility training;Therapeutic activities;Balance training;Neuromuscular re-education;Manual techniques;Patient/family education;Orthotic Fit/Training;Scar mobilization;Dry needling;Energy conservation;Taping;Vasopneumatic Device    PT Next Visit Plan Assess response to updated HEP. Continue hip, quad, and core strengthening. Work on . Assess 5x STS and balance.    PT Home Exercise Plan  Access Code [email or web address]    Consulted and Agree with Plan of Care Patient;Family member/caregiver    Family  Member Consulted Husband           Patient will benefit from skilled therapeutic intervention in order to improve the following deficits and impairments:  Abnormal gait,Increased fascial restricitons,Pain,Decreased mobility,Decreased activity tolerance,Decreased range of motion,Decreased strength,Hypomobility,Decreased balance,Difficulty walking,Increased edema,Decreased endurance,Impaired flexibility,Decreased scar mobility  Visit Diagnosis: Status post total right knee replacement  Muscle weakness (generalized)  Chronic pain of left knee  Chronic pain of right knee  Stiffness of right knee, not elsewhere classified  Stiffness of left knee, not elsewhere classified     Problem List Patient Active Problem List   Diagnosis Date Noted  . S/P total knee replacement, right 04/15/2020  . Osteopenia 04/10/2020  . Major depressive disorder, recurrent episode, mild (HCC) 10/31/2017  . ADHD, predominantly inattentive type 10/31/2017  . Primary osteoarthritis of right knee 09/09/2016  . Primary osteoarthritis, right shoulder 09/09/2016  . Arthritis 07/25/2012  . Tinnitus 07/25/2012  . Mixed incontinence urge and stress 07/25/2012    [facility] April Ma L Macon PT, DPT 05/01/2020, 3:21 PM  [facility] [address] [place], [place], [address] Phone: [phone]   Fax:  [phone]  Name: Megan Nunez MRN: [id number] Date of Birth: 02-05-1945

## 2020-05-06 ENCOUNTER — Ambulatory Visit (HOSPITAL_BASED_OUTPATIENT_CLINIC_OR_DEPARTMENT_OTHER): Payer: Medicare Other | Admitting: Physical Therapy

## 2020-05-06 ENCOUNTER — Other Ambulatory Visit: Payer: Self-pay

## 2020-05-06 DIAGNOSIS — M25661 Stiffness of right knee, not elsewhere classified: Secondary | ICD-10-CM

## 2020-05-06 DIAGNOSIS — G8929 Other chronic pain: Secondary | ICD-10-CM | POA: Diagnosis not present

## 2020-05-06 DIAGNOSIS — Z96651 Presence of right artificial knee joint: Secondary | ICD-10-CM | POA: Diagnosis not present

## 2020-05-06 DIAGNOSIS — M6281 Muscle weakness (generalized): Secondary | ICD-10-CM | POA: Diagnosis not present

## 2020-05-06 DIAGNOSIS — M25562 Pain in left knee: Secondary | ICD-10-CM | POA: Diagnosis not present

## 2020-05-06 DIAGNOSIS — M25662 Stiffness of left knee, not elsewhere classified: Secondary | ICD-10-CM

## 2020-05-06 DIAGNOSIS — M25561 Pain in right knee: Secondary | ICD-10-CM | POA: Diagnosis not present

## 2020-05-06 NOTE — Therapy (Signed)
[facility] [address] [place], [place], [address] Phone: [phone]   Fax:  [phone]  Physical Therapy Treatment  Patient Details  Name: Megan Nunez MRN: [id number] Date of Birth: [birth year] Referring Provider (PT): Teryl Lucy, MD   Encounter Date: 05/06/2020   PT End of Session - 05/06/20 1703    Visit Number 3    Number of Visits 17    Date for PT Re-Evaluation 06/24/20    Authorization Type UHC Medicare    PT Start Time 1445   Late arrival   PT Stop Time 1530    PT Time Calculation (min) 45 min    Activity Tolerance Patient tolerated treatment well    Behavior During Therapy [facility] for tasks assessed/performed           Past Medical History:  Diagnosis Date  . Anxiety   . Arthritis   . Depression   . Family history of adverse reaction to anesthesia 2012   Mother has cardiac  arrest after surgery  . Hepatitis    high antibodies for hepatitis  . Obesity     Past Surgical History:  Procedure Laterality Date  . LAPAROSCOPIC TUBAL LIGATION  1985  . TOTAL KNEE ARTHROPLASTY Right 04/15/2020   Procedure: TOTAL KNEE ARTHROPLASTY;  Surgeon: Teryl Lucy, MD;  Location: [facility];  Service: Orthopedics;  Laterality: Right;  . WISDOM TOOTH EXTRACTION     age 75    There were no vitals filed for this visit.   Subjective Assessment - 05/06/20 1445    Subjective Pt and family report fatigued. Pt states her knee is doing pretty well. No issues noted with current HEP.    Limitations Walking    How long can you sit comfortably? n/a    How long can you stand comfortably? This feels the worse. ~5 min before needing to sit.    How long can you walk comfortably? ~4 blocks was the limit    Patient Stated Goals Improve R LE knee strength, walking and standing endurance    Currently in Pain? Yes    Pain Score 3     Pain Location Knee    Pain Orientation Right    Pain Descriptors / Indicators Aching    Pain Type  Surgical pain    Pain Onset More than a month ago    Pain Frequency Constant              [facility] PT Assessment - 05/06/20 0001      AROM   Right Knee Flexion 90   swelling     PROM   Right Knee Extension 0    Right Knee Flexion 110                         [facility] Adult PT Treatment/Exercise - 05/06/20 0001      Knee/Hip Exercises: Machines for Strengthening   Cybex Knee Extension 2x10    Cybex Knee Flexion 2x10 @ 20#    Total Gym Leg Press attempted SL @ 55# initially but pt with increased pain. 2x10 DL @ 70#    Other Machine Ankle DF 2x10 DL @ 62#; hip abduction @ 40# 2x10      Vasopneumatic   Number Minutes Vasopneumatic  10 minutes    Vasopnuematic Location  Knee    Vasopneumatic Pressure Medium    Vasopneumatic Temperature  34      Manual Therapy   Manual Therapy  Passive ROM    Passive ROM R knee flexion                  PT Education - 05/06/20 1700    Education Details Answered pt's questions in regards to her exercises and shoulder pain.    Person(s) Educated Patient;Spouse    Methods Explanation;Demonstration    Comprehension Verbalized understanding;Returned demonstration;Tactile cues required;Need further instruction            PT Short Term Goals - 04/29/20 1538      PT SHORT TERM GOAL #1   Title Pt will be independent with HEP    Time 4    Period Weeks    Status New    Target Date 05/27/20      PT SHORT TERM GOAL #2   Title Pt will be able to walk with normal reciprocal gait pattern    Baseline Mildly antalgic    Time 4    Period Days    Status New    Target Date 05/27/20      PT SHORT TERM GOAL #3   Title Pt will be able to demo R knee AROM of 0 to >105 deg    Baseline 2 to 96 deg    Time 4    Period Weeks    Status New    Target Date 05/27/20      PT SHORT TERM GOAL #4   Title --    Baseline --    Time --    Period --    Status --    Target Date --             PT Long Term Goals - 04/29/20 1540       PT LONG TERM GOAL #1   Title Pt will be independent with final HEP    Time 8    Period Weeks    Status New    Target Date 06/24/20      PT LONG TERM GOAL #2   Title Pt will be able to perform 5x STS in <13 sec to demo increased functional strength and balance    Time 8    Period Weeks    Status New    Target Date 06/24/20      PT LONG TERM GOAL #3   Title Pt will be able to perform steps at home with normal reciprocal pattern and no hand rail    Baseline Needs hand rail due to steep steps at home    Time 8    Period Weeks    Status New    Target Date 06/24/20      PT LONG TERM GOAL #4   Title Pt will be able to tolerate standing at least 15 minutes for improved home and community mobility    Baseline Prior to TKA, only able to tolerate ~5 min    Time 8    Period Weeks    Status New    Target Date 06/24/20                 Plan - 05/06/20 1700    Clinical Impression Statement Session focused on educating pt on use of gym equipment for her knee strengthening. Discussed progressing her exercises at home with higher resistance theraband. Pt with increasing knee flexion PROM to 110 deg this session.    Personal Factors and Comorbidities Age;Fitness;Past/Current Experience;Time since onset of injury/illness/exacerbation    Examination-Activity Limitations Transfers;Locomotion Level;Stairs;Stand;Hygiene/Grooming  Examination-Participation Restrictions Community Activity;Driving;Cleaning    Stability/Clinical Decision Making Stable/Uncomplicated    Rehab Potential Good    PT Frequency 2x / week   Pt to return post surgery after HHPT   PT Duration 8 weeks    PT Treatment/Interventions ADLs/Self Care Home Management;Therapeutic exercise;Aquatic Therapy;Cryotherapy;Iontophoresis 4mg /ml Dexamethasone;Moist Heat;Ultrasound;DME Instruction;Electrical Stimulation;Gait training;Stair training;Functional mobility training;Therapeutic activities;Balance training;Neuromuscular  re-education;Manual techniques;Patient/family education;Orthotic Fit/Training;Scar mobilization;Dry needling;Energy conservation;Taping;Vasopneumatic Device    PT Next Visit Plan Assess response to updated HEP. Continue hip, quad, and core strengthening (progress into standing!). Assess 5x STS and balance.    PT Home Exercise Plan Access Code    Consulted and Agree with Plan of Care Patient;Family member/caregiver    Family Member Consulted Husband           Patient will benefit from skilled therapeutic intervention in order to improve the following deficits and impairments:  Abnormal gait,Increased fascial restricitons,Pain,Decreased mobility,Decreased activity tolerance,Decreased range of motion,Decreased strength,Hypomobility,Decreased balance,Difficulty walking,Increased edema,Decreased endurance,Impaired flexibility,Decreased scar mobility  Visit Diagnosis: Status post total right knee replacement  Muscle weakness (generalized)  Chronic pain of left knee  Chronic pain of right knee  Stiffness of right knee, not elsewhere classified  Stiffness of left knee, not elsewhere classified     Problem List Patient Active Problem List   Diagnosis Date Noted  . S/P total knee replacement, right 04/15/2020  . Osteopenia 04/10/2020  . Major depressive disorder, recurrent episode, mild (HCC) 10/31/2017  . ADHD, predominantly inattentive type 10/31/2017  . Primary osteoarthritis of right knee 09/09/2016  . Primary osteoarthritis, right shoulder 09/09/2016  . Arthritis 07/25/2012  . Tinnitus 07/25/2012  . Mixed incontinence urge and stress 07/25/2012    [facility] April Ma L Malta PT, DPT 05/06/2020, 5:05 PM  [facility] [address] [place], [place], [place] Phone: [phone]   Fax:  [phone]  Name: Megan Nunez MRN: [name] Date of Birth: 07/17/1945

## 2020-05-09 ENCOUNTER — Other Ambulatory Visit: Payer: Self-pay

## 2020-05-09 ENCOUNTER — Ambulatory Visit (HOSPITAL_BASED_OUTPATIENT_CLINIC_OR_DEPARTMENT_OTHER): Payer: Medicare Other | Admitting: Physical Therapy

## 2020-05-09 DIAGNOSIS — M25561 Pain in right knee: Secondary | ICD-10-CM | POA: Diagnosis not present

## 2020-05-09 DIAGNOSIS — M25662 Stiffness of left knee, not elsewhere classified: Secondary | ICD-10-CM | POA: Diagnosis not present

## 2020-05-09 DIAGNOSIS — G8929 Other chronic pain: Secondary | ICD-10-CM

## 2020-05-09 DIAGNOSIS — M25661 Stiffness of right knee, not elsewhere classified: Secondary | ICD-10-CM

## 2020-05-09 DIAGNOSIS — M6281 Muscle weakness (generalized): Secondary | ICD-10-CM

## 2020-05-09 DIAGNOSIS — Z96651 Presence of right artificial knee joint: Secondary | ICD-10-CM

## 2020-05-09 DIAGNOSIS — M25562 Pain in left knee: Secondary | ICD-10-CM

## 2020-05-09 NOTE — Therapy (Signed)
[facility] [address] [place], [place], [address] Phone: [phone]   Fax:  [phone]  Physical Therapy Treatment  Patient Details  Name: Megan Nunez MRN: [id number] Date of Birth: Aug 31, 1969 Referring Provider (PT): Teryl Lucy, MD   Encounter Date: 05/09/2020   PT End of Session - 05/09/20 1641    Visit Number 4    Number of Visits 17    Date for PT Re-Evaluation 06/24/20    Authorization Type UHC Medicare    PT Start Time 1442   late arrival   PT Stop Time 1515    PT Time Calculation (min) 33 min    Activity Tolerance Patient tolerated treatment well    Behavior During Therapy [facility] for tasks assessed/performed           Past Medical History:  Diagnosis Date  . Anxiety   . Arthritis   . Depression   . Family history of adverse reaction to anesthesia 2012   Mother has cardiac  arrest after surgery  . Hepatitis    high antibodies for hepatitis  . Obesity     Past Surgical History:  Procedure Laterality Date  . LAPAROSCOPIC TUBAL LIGATION  1985  . TOTAL KNEE ARTHROPLASTY Right 04/15/2020   Procedure: TOTAL KNEE ARTHROPLASTY;  Surgeon: Teryl Lucy, MD;  Location: [facility];  Service: Orthopedics;  Laterality: Right;  . WISDOM TOOTH EXTRACTION     age 75    There were no vitals filed for this visit.   Subjective Assessment - 05/09/20 1444    Subjective Pt states she hurt a lot after attempting leg press the day after. Pt states she lost her exercise papers.    Limitations Walking    How long can you sit comfortably? n/a    How long can you stand comfortably? This feels the worse. ~5 min before needing to sit.    How long can you walk comfortably? ~4 blocks was the limit    Patient Stated Goals Improve R LE knee strength, walking and standing endurance    Pain Onset More than a month ago              [facility] PT Assessment - 05/09/20 0001      AROM   Right Knee Flexion 105      PROM   Right  Knee Flexion 120                         [facility] Adult PT Treatment/Exercise - 05/09/20 0001      Knee/Hip Exercises: Stretches   Knee: Self-Stretch to increase Flexion Right;5 reps;10 seconds      Knee/Hip Exercises: Aerobic   Other Aerobic Sci-fit x4 min for knee ROM      Knee/Hip Exercises: Standing   Heel Raises Left;Right;2 sets;10 reps    Hip Extension Stengthening;Both;2 sets;10 reps;Knee straight    Extension Limitations red tband      Knee/Hip Exercises: Seated   Sit to Sand 10 reps;without UE support      Knee/Hip Exercises: Sidelying   Clams Red tband 2x10 bilat      Knee/Hip Exercises: Prone   Hamstring Curl 10 reps    Hamstring Curl Limitations eccentric      Manual Therapy   Manual Therapy Passive ROM    Passive ROM R knee flexion                    PT Short  Term Goals - 04/29/20 1538      PT SHORT TERM GOAL #1   Title Pt will be independent with HEP    Time 4    Period Weeks    Status New    Target Date 05/27/20      PT SHORT TERM GOAL #2   Title Pt will be able to walk with normal reciprocal gait pattern    Baseline Mildly antalgic    Time 4    Period Days    Status New    Target Date 05/27/20      PT SHORT TERM GOAL #3   Title Pt will be able to demo R knee AROM of 0 to >105 deg    Baseline 2 to 96 deg    Time 4    Period Weeks    Status New    Target Date 05/27/20      PT SHORT TERM GOAL #4   Title --    Baseline --    Time --    Period --    Status --    Target Date --             PT Long Term Goals - 04/29/20 1540      PT LONG TERM GOAL #1   Title Pt will be independent with final HEP    Time 8    Period Weeks    Status New    Target Date 06/24/20      PT LONG TERM GOAL #2   Title Pt will be able to perform 5x STS in <13 sec to demo increased functional strength and balance    Time 8    Period Weeks    Status New    Target Date 06/24/20      PT LONG TERM GOAL #3   Title Pt will be able to  perform steps at home with normal reciprocal pattern and no hand rail    Baseline Needs hand rail due to steep steps at home    Time 8    Period Weeks    Status New    Target Date 06/24/20      PT LONG TERM GOAL #4   Title Pt will be able to tolerate standing at least 15 minutes for improved home and community mobility    Baseline Prior to TKA, only able to tolerate ~5 min    Time 8    Period Weeks    Status New    Target Date 06/24/20                 Plan - 05/09/20 1641    Clinical Impression Statement Pt with improving knee AROM and PROM. Session focused on progressing HEP and strengthening at home. Pt able to tolerate standing exercises well.    Personal Factors and Comorbidities Age;Fitness;Past/Current Experience;Time since onset of injury/illness/exacerbation    Examination-Activity Limitations Transfers;Locomotion Level;Stairs;Stand;Hygiene/Grooming    Examination-Participation Restrictions Community Activity;Driving;Cleaning    Stability/Clinical Decision Making Stable/Uncomplicated    Rehab Potential Good    PT Frequency 2x / week   Pt to return post surgery after HHPT   PT Duration 8 weeks    PT Treatment/Interventions ADLs/Self Care Home Management;Therapeutic exercise;Aquatic Therapy;Cryotherapy;Iontophoresis 4mg /ml Dexamethasone;Moist Heat;Ultrasound;DME Instruction;Electrical Stimulation;Gait training;Stair training;Functional mobility training;Therapeutic activities;Balance training;Neuromuscular re-education;Manual techniques;Patient/family education;Orthotic Fit/Training;Scar mobilization;Dry needling;Energy conservation;Taping;Vasopneumatic Device    PT Next Visit Plan Assess response to updated HEP. Continue hip, quad, and core strengthening (progress into standing!).    PT  Home Exercise Plan Access Code [email or web address]    Consulted and Agree with Plan of Care Patient;Family member/caregiver    Family Member Consulted Husband           Patient will benefit from  skilled therapeutic intervention in order to improve the following deficits and impairments:  Abnormal gait,Increased fascial restricitons,Pain,Decreased mobility,Decreased activity tolerance,Decreased range of motion,Decreased strength,Hypomobility,Decreased balance,Difficulty walking,Increased edema,Decreased endurance,Impaired flexibility,Decreased scar mobility  Visit Diagnosis: Status post total right knee replacement  Muscle weakness (generalized)  Chronic pain of left knee  Chronic pain of right knee  Stiffness of right knee, not elsewhere classified  Stiffness of left knee, not elsewhere classified     Problem List Patient Active Problem List   Diagnosis Date Noted  . S/P total knee replacement, right 04/15/2020  . Osteopenia 04/10/2020  . Major depressive disorder, recurrent episode, mild (HCC) 10/31/2017  . ADHD, predominantly inattentive type 10/31/2017  . Primary osteoarthritis of right knee 09/09/2016  . Primary osteoarthritis, right shoulder 09/09/2016  . Arthritis 07/25/2012  . Tinnitus 07/25/2012  . Mixed incontinence urge and stress 07/25/2012    [facility] April Ma L Berlinda Farve PT, DPT 05/09/2020, 4:43 PM  [facility] [address] [place], [place], [address] Phone: [phone]   Fax:  [phone]  Name: MERINA BEHRENDT MRN: [id number] Date of Birth: 12-Feb-1945

## 2020-05-13 ENCOUNTER — Ambulatory Visit (HOSPITAL_BASED_OUTPATIENT_CLINIC_OR_DEPARTMENT_OTHER): Payer: Medicare Other | Admitting: Physical Therapy

## 2020-05-14 ENCOUNTER — Encounter (HOSPITAL_BASED_OUTPATIENT_CLINIC_OR_DEPARTMENT_OTHER): Payer: Self-pay | Admitting: Physical Therapy

## 2020-05-14 ENCOUNTER — Other Ambulatory Visit: Payer: Self-pay

## 2020-05-14 ENCOUNTER — Ambulatory Visit (HOSPITAL_BASED_OUTPATIENT_CLINIC_OR_DEPARTMENT_OTHER): Payer: Medicare Other | Attending: Orthopedic Surgery | Admitting: Physical Therapy

## 2020-05-14 DIAGNOSIS — M25662 Stiffness of left knee, not elsewhere classified: Secondary | ICD-10-CM | POA: Insufficient documentation

## 2020-05-14 DIAGNOSIS — Z96651 Presence of right artificial knee joint: Secondary | ICD-10-CM | POA: Diagnosis not present

## 2020-05-14 DIAGNOSIS — M6281 Muscle weakness (generalized): Secondary | ICD-10-CM | POA: Insufficient documentation

## 2020-05-14 DIAGNOSIS — M25562 Pain in left knee: Secondary | ICD-10-CM | POA: Insufficient documentation

## 2020-05-14 DIAGNOSIS — M25561 Pain in right knee: Secondary | ICD-10-CM | POA: Insufficient documentation

## 2020-05-14 DIAGNOSIS — G8929 Other chronic pain: Secondary | ICD-10-CM | POA: Insufficient documentation

## 2020-05-14 DIAGNOSIS — M25661 Stiffness of right knee, not elsewhere classified: Secondary | ICD-10-CM | POA: Insufficient documentation

## 2020-05-15 NOTE — Therapy (Signed)
[facility] [address] [place], [place], [address] Phone: [phone]   Fax:  [phone]  Physical Therapy Treatment  Patient Details  Name: Megan Nunez MRN: [id number] Date of Birth: 05-15-1945 Referring Provider (PT): Teryl Lucy, MD   Encounter Date: 05/14/2020   PT End of Session - 05/15/20 0909    Visit Number 5    Number of Visits 17    Date for PT Re-Evaluation 06/24/20    Authorization Type UHC Medicare    PT Start Time 1300    PT Stop Time 1342    PT Time Calculation (min) 42 min    Activity Tolerance Patient tolerated treatment well    Behavior During Therapy [facility] for tasks assessed/performed           Past Medical History:  Diagnosis Date  . Anxiety   . Arthritis   . Depression   . Family history of adverse reaction to anesthesia 2012   Mother has cardiac  arrest after surgery  . Hepatitis    high antibodies for hepatitis  . Obesity     Past Surgical History:  Procedure Laterality Date  . LAPAROSCOPIC TUBAL LIGATION  1985  . TOTAL KNEE ARTHROPLASTY Right 04/15/2020   Procedure: TOTAL KNEE ARTHROPLASTY;  Surgeon: Teryl Lucy, MD;  Location: [facility];  Service: Orthopedics;  Laterality: Right;  . WISDOM TOOTH EXTRACTION     age 75    There were no vitals filed for this visit.   Subjective Assessment - 05/14/20 1307    Subjective Patient feels like she overdid it with her right knee over the past week. The left knee has had significant pain. The left leg is more limiting then the right.    Limitations Walking    How long can you sit comfortably? n/a    How long can you stand comfortably? This feels the worse. ~5 min before needing to sit.    How long can you walk comfortably? ~4 blocks was the limit    Patient Stated Goals Improve R LE knee strength, walking and standing endurance    Currently in Pain? Yes    Pain Score 6     Pain Location Knee    Pain Orientation Left    Pain Descriptors  / Indicators Aching    Pain Type Surgical pain    Pain Onset More than a month ago    Pain Frequency Constant    Aggravating Factors  increased standing time    Pain Relieving Factors rest    Effect of Pain on Daily Activities limited standing and walking              [facility] PT Assessment - 05/15/20 0001      PROM   Right Knee Extension 0    Right Knee Flexion 115                         [facility] Adult PT Treatment/Exercise - 05/15/20 0001      Knee/Hip Exercises: Standing   Heel Raises Left;Right;2 sets;10 reps    Lateral Step Up Limitations x15 4 inch    Forward Step Up Limitations x15 4 inch    Step Down Limitations x15 4 inch      Knee/Hip Exercises: Supine   Short Arc Quad Sets Limitations 2x10 1 lb    Straight Leg Raises Limitations 2x15 2lbs  PT Short Term Goals - 04/29/20 1538      PT SHORT TERM GOAL #1   Title Pt will be independent with HEP    Time 4    Period Weeks    Status New    Target Date 05/27/20      PT SHORT TERM GOAL #2   Title Pt will be able to walk with normal reciprocal gait pattern    Baseline Mildly antalgic    Time 4    Period Days    Status New    Target Date 05/27/20      PT SHORT TERM GOAL #3   Title Pt will be able to demo R knee AROM of 0 to >105 deg    Baseline 2 to 96 deg    Time 4    Period Weeks    Status New    Target Date 05/27/20      PT SHORT TERM GOAL #4   Title --    Baseline --    Time --    Period --    Status --    Target Date --             PT Long Term Goals - 04/29/20 1540      PT LONG TERM GOAL #1   Title Pt will be independent with final HEP    Time 8    Period Weeks    Status New    Target Date 06/24/20      PT LONG TERM GOAL #2   Title Pt will be able to perform 5x STS in <13 sec to demo increased functional strength and balance    Time 8    Period Weeks    Status New    Target Date 06/24/20      PT LONG TERM GOAL #3   Title Pt will be  able to perform steps at home with normal reciprocal pattern and no hand rail    Baseline Needs hand rail due to steep steps at home    Time 8    Period Weeks    Status New    Target Date 06/24/20      PT LONG TERM GOAL #4   Title Pt will be able to tolerate standing at least 15 minutes for improved home and community mobility    Baseline Prior to TKA, only able to tolerate ~5 min    Time 8    Period Weeks    Status New    Target Date 06/24/20                 Plan - 05/15/20 0910    Clinical Impression Statement Patient continues to make great progress. She had minimal pain with treatment today. She reported with self stretching she has pain but only at end range. She was encouraged that this is normal and expected. She had 0-115 total arc today. She tolerated standing exercises well. We initiated stair training. She had mild difficulty with going down steps.    Personal Factors and Comorbidities Age;Fitness;Past/Current Experience;Time since onset of injury/illness/exacerbation    Examination-Activity Limitations Transfers;Locomotion Level;Stairs;Stand;Hygiene/Grooming    Examination-Participation Restrictions Community Activity;Driving;Cleaning    Stability/Clinical Decision Making Stable/Uncomplicated    Clinical Decision Making Low    Rehab Potential Good    PT Frequency 2x / week    PT Duration 8 weeks    PT Treatment/Interventions ADLs/Self Care Home Management;Therapeutic exercise;Aquatic Therapy;Cryotherapy;Iontophoresis 4mg /ml Dexamethasone;Moist Heat;Ultrasound;DME Instruction;Electrical Stimulation;Gait training;Stair training;Functional mobility  training;Therapeutic activities;Balance training;Neuromuscular re-education;Manual techniques;Patient/family education;Orthotic Fit/Training;Scar mobilization;Dry needling;Energy conservation;Taping;Vasopneumatic Device    PT Next Visit Plan Assess response to updated HEP. Continue hip, quad, and core strengthening (progress  into standing!).    PT Home Exercise Plan Access Code [email or web address]    Family Member Consulted Husband           Patient will benefit from skilled therapeutic intervention in order to improve the following deficits and impairments:  Abnormal gait,Increased fascial restricitons,Pain,Decreased mobility,Decreased activity tolerance,Decreased range of motion,Decreased strength,Hypomobility,Decreased balance,Difficulty walking,Increased edema,Decreased endurance,Impaired flexibility,Decreased scar mobility  Visit Diagnosis: Status post total right knee replacement  Muscle weakness (generalized)  Chronic pain of left knee  Stiffness of right knee, not elsewhere classified  Stiffness of left knee, not elsewhere classified  Chronic pain of right knee     Problem List Patient Active Problem List   Diagnosis Date Noted  . S/P total knee replacement, right 04/15/2020  . Osteopenia 04/10/2020  . Major depressive disorder, recurrent episode, mild (HCC) 10/31/2017  . ADHD, predominantly inattentive type 10/31/2017  . Primary osteoarthritis of right knee 09/09/2016  . Primary osteoarthritis, right shoulder 09/09/2016  . Arthritis 07/25/2012  . Tinnitus 07/25/2012  . Mixed incontinence urge and stress 07/25/2012    Dessie Coma PT DPT  05/15/2020, 9:20 AM  [facility] [address] [place], [place], [address] Phone: [phone]   Fax:  [phone]  Name: Megan Nunez MRN: [id number] Date of Birth: September 28, 1945

## 2020-05-16 ENCOUNTER — Ambulatory Visit (HOSPITAL_BASED_OUTPATIENT_CLINIC_OR_DEPARTMENT_OTHER): Payer: Medicare Other | Admitting: Physical Therapy

## 2020-05-16 ENCOUNTER — Other Ambulatory Visit: Payer: Self-pay

## 2020-05-16 ENCOUNTER — Encounter (HOSPITAL_BASED_OUTPATIENT_CLINIC_OR_DEPARTMENT_OTHER): Payer: Self-pay | Admitting: Physical Therapy

## 2020-05-16 DIAGNOSIS — M25662 Stiffness of left knee, not elsewhere classified: Secondary | ICD-10-CM | POA: Diagnosis not present

## 2020-05-16 DIAGNOSIS — Z96651 Presence of right artificial knee joint: Secondary | ICD-10-CM | POA: Diagnosis not present

## 2020-05-16 DIAGNOSIS — M25661 Stiffness of right knee, not elsewhere classified: Secondary | ICD-10-CM | POA: Diagnosis not present

## 2020-05-16 DIAGNOSIS — M25561 Pain in right knee: Secondary | ICD-10-CM | POA: Diagnosis not present

## 2020-05-16 DIAGNOSIS — G8929 Other chronic pain: Secondary | ICD-10-CM

## 2020-05-16 DIAGNOSIS — M6281 Muscle weakness (generalized): Secondary | ICD-10-CM | POA: Diagnosis not present

## 2020-05-16 DIAGNOSIS — M25562 Pain in left knee: Secondary | ICD-10-CM | POA: Diagnosis not present

## 2020-05-18 ENCOUNTER — Encounter (HOSPITAL_BASED_OUTPATIENT_CLINIC_OR_DEPARTMENT_OTHER): Payer: Self-pay | Admitting: Physical Therapy

## 2020-05-18 NOTE — Therapy (Signed)
[facility] [address] [place], [place], [address] Phone: [phone]   Fax:  [phone]  Physical Therapy Treatment  Patient Details  Name: Megan Nunez MRN: [id number] Date of Birth: [birth year] Referring Provider (PT): Teryl Lucy, MD   Encounter Date: 05/16/2020   PT End of Session - 05/18/20 0905    Visit Number 6    Number of Visits 17    Date for PT Re-Evaluation 06/24/20    Authorization Type UHC Medicare    PT Start Time 1430    PT Stop Time 1510    PT Time Calculation (min) 40 min    Activity Tolerance Patient tolerated treatment well    Behavior During Therapy [facility] for tasks assessed/performed           Past Medical History:  Diagnosis Date  . Anxiety   . Arthritis   . Depression   . Family history of adverse reaction to anesthesia 2012   Mother has cardiac  arrest after surgery  . Hepatitis    high antibodies for hepatitis  . Obesity     Past Surgical History:  Procedure Laterality Date  . LAPAROSCOPIC TUBAL LIGATION  1985  . TOTAL KNEE ARTHROPLASTY Right 04/15/2020   Procedure: TOTAL KNEE ARTHROPLASTY;  Surgeon: Teryl Lucy, MD;  Location: [facility];  Service: Orthopedics;  Laterality: Right;  . WISDOM TOOTH EXTRACTION     age 22    There were no vitals filed for this visit.   Subjective Assessment - 05/18/20 0903    Subjective Patient reports she was very sore yesterday. She reports she took a half a pain pill today. She has been to the MD who is happy with her porgress. she reports pain at the top of the incision. The PA reported that is likely due to the tornequet    Limitations Walking    How long can you sit comfortably? n/a    How long can you stand comfortably? This feels the worse. ~5 min before needing to sit.    How long can you walk comfortably? ~4 blocks was the limit    Patient Stated Goals Improve R LE knee strength, walking and standing endurance    Currently in Pain? Yes     Pain Score 4     Pain Location Knee    Pain Orientation Left    Pain Descriptors / Indicators Aching    Pain Type Surgical pain    Pain Onset More than a month ago    Pain Frequency Intermittent    Aggravating Factors  standing and walking    Pain Relieving Factors rest    Effect of Pain on Daily Activities difficulty standing and walking                             [facility] Adult PT Treatment/Exercise - 05/18/20 0001      Knee/Hip Exercises: Standing   Heel Raises Left;Right;2 sets;10 reps    Lateral Step Up Limitations x15 4 inch    Forward Step Up Limitations x15 4 inch    Step Down Limitations x15 4 inch    Other Standing Knee Exercises slow march 2x15      Knee/Hip Exercises: Supine   Quad Sets Limitations 2x15    Short Arc Quad Sets Limitations 3x10    Straight Leg Raises Limitations 2x15      Manual Therapy   Manual Therapy Passive ROM  Passive ROM R knee flexion                  PT Education - 05/18/20 0905    Education Details reviewed HEp and symptom mangement    Person(s) Educated Patient    Methods Explanation;Demonstration;Tactile cues;Verbal cues    Comprehension Verbalized understanding;Returned demonstration;Verbal cues required;Tactile cues required            PT Short Term Goals - 04/29/20 1538      PT SHORT TERM GOAL #1   Title Pt will be independent with HEP    Time 4    Period Weeks    Status New    Target Date 05/27/20      PT SHORT TERM GOAL #2   Title Pt will be able to walk with normal reciprocal gait pattern    Baseline Mildly antalgic    Time 4    Period Days    Status New    Target Date 05/27/20      PT SHORT TERM GOAL #3   Title Pt will be able to demo R knee AROM of 0 to >105 deg    Baseline 2 to 96 deg    Time 4    Period Weeks    Status New    Target Date 05/27/20      PT SHORT TERM GOAL #4   Title --    Baseline --    Time --    Period --    Status --    Target Date --              PT Long Term Goals - 04/29/20 1540      PT LONG TERM GOAL #1   Title Pt will be independent with final HEP    Time 8    Period Weeks    Status New    Target Date 06/24/20      PT LONG TERM GOAL #2   Title Pt will be able to perform 5x STS in <13 sec to demo increased functional strength and balance    Time 8    Period Weeks    Status New    Target Date 06/24/20      PT LONG TERM GOAL #3   Title Pt will be able to perform steps at home with normal reciprocal pattern and no hand rail    Baseline Needs hand rail due to steep steps at home    Time 8    Period Weeks    Status New    Target Date 06/24/20      PT LONG TERM GOAL #4   Title Pt will be able to tolerate standing at least 15 minutes for improved home and community mobility    Baseline Prior to TKA, only able to tolerate ~5 min    Time 8    Period Weeks    Status New    Target Date 06/24/20                 Plan - 05/18/20 0906    Clinical Impression Statement Patient continues to tolerate exercises well. She had a little more pain today then last visit. her range continues to progress well. Seh was encouraged to use her ice at home. We will continue with stair training. She was able to do today with only a minimal increase in pain.    Personal Factors and Comorbidities Age;Fitness;Past/Current Experience;Time since onset of injury/illness/exacerbation    Examination-Activity  Limitations Transfers;Locomotion Level;Stairs;Stand;Hygiene/Grooming    Examination-Participation Restrictions Community Activity;Driving;Cleaning    Stability/Clinical Decision Making Stable/Uncomplicated    Clinical Decision Making Low    Rehab Potential Good    PT Frequency 2x / week    PT Duration 8 weeks    PT Treatment/Interventions ADLs/Self Care Home Management;Therapeutic exercise;Aquatic Therapy;Cryotherapy;Iontophoresis 4mg /ml Dexamethasone;Moist Heat;Ultrasound;DME Instruction;Electrical Stimulation;Gait training;Stair  training;Functional mobility training;Therapeutic activities;Balance training;Neuromuscular re-education;Manual techniques;Patient/family education;Orthotic Fit/Training;Scar mobilization;Dry needling;Energy conservation;Taping;Vasopneumatic Device    PT Next Visit Plan Assess response to updated HEP. Continue hip, quad, and core strengthening (progress into standing!).    PT Home Exercise Plan Access Code    Consulted and Agree with Plan of Care Patient;Family member/caregiver           Patient will benefit from skilled therapeutic intervention in order to improve the following deficits and impairments:  Abnormal gait,Increased fascial restricitons,Pain,Decreased mobility,Decreased activity tolerance,Decreased range of motion,Decreased strength,Hypomobility,Decreased balance,Difficulty walking,Increased edema,Decreased endurance,Impaired flexibility,Decreased scar mobility  Visit Diagnosis: Status post total right knee replacement  Muscle weakness (generalized)  Chronic pain of left knee  Stiffness of right knee, not elsewhere classified  Stiffness of left knee, not elsewhere classified  Chronic pain of right knee     Problem List Patient Active Problem List   Diagnosis Date Noted  . S/P total knee replacement, right 04/15/2020  . Osteopenia 04/10/2020  . Major depressive disorder, recurrent episode, mild (HCC) 10/31/2017  . ADHD, predominantly inattentive type 10/31/2017  . Primary osteoarthritis of right knee 09/09/2016  . Primary osteoarthritis, right shoulder 09/09/2016  . Arthritis 07/25/2012  . Tinnitus 07/25/2012  . Mixed incontinence urge and stress 07/25/2012    09/25/2012 PT DPT  05/18/2020, 9:11 AM  [facility] [address] [place], [place], [place] Phone: [phone]   Fax:  [phone]  Name: RAVONDA BRECHEEN MRN: [name] Date of Birth: 20-May-1945

## 2020-05-20 ENCOUNTER — Encounter (HOSPITAL_BASED_OUTPATIENT_CLINIC_OR_DEPARTMENT_OTHER): Payer: Self-pay | Admitting: Physical Therapy

## 2020-05-20 ENCOUNTER — Other Ambulatory Visit: Payer: Self-pay

## 2020-05-20 ENCOUNTER — Ambulatory Visit (HOSPITAL_BASED_OUTPATIENT_CLINIC_OR_DEPARTMENT_OTHER): Payer: Medicare Other | Attending: Orthopedic Surgery | Admitting: Physical Therapy

## 2020-05-20 DIAGNOSIS — M25661 Stiffness of right knee, not elsewhere classified: Secondary | ICD-10-CM | POA: Insufficient documentation

## 2020-05-20 DIAGNOSIS — M25662 Stiffness of left knee, not elsewhere classified: Secondary | ICD-10-CM | POA: Diagnosis not present

## 2020-05-20 DIAGNOSIS — M25561 Pain in right knee: Secondary | ICD-10-CM | POA: Insufficient documentation

## 2020-05-20 DIAGNOSIS — M25562 Pain in left knee: Secondary | ICD-10-CM | POA: Diagnosis not present

## 2020-05-20 DIAGNOSIS — Z96651 Presence of right artificial knee joint: Secondary | ICD-10-CM | POA: Insufficient documentation

## 2020-05-20 DIAGNOSIS — M6281 Muscle weakness (generalized): Secondary | ICD-10-CM | POA: Diagnosis not present

## 2020-05-20 DIAGNOSIS — G8929 Other chronic pain: Secondary | ICD-10-CM | POA: Insufficient documentation

## 2020-05-21 ENCOUNTER — Encounter (HOSPITAL_BASED_OUTPATIENT_CLINIC_OR_DEPARTMENT_OTHER): Payer: Self-pay | Admitting: Physical Therapy

## 2020-05-21 NOTE — Therapy (Signed)
[facility] [address] [place], [place], [address] Phone: [phone]   Fax:  [phone]  Physical Therapy Treatment  Patient Details  Name: Megan Nunez MRN: [id number] Date of Birth: [birth year] Referring Provider (PT): Teryl Lucy, MD   Encounter Date: 05/20/2020   PT End of Session - 05/21/20 1625    Visit Number 7    Number of Visits 17    Date for PT Re-Evaluation 06/24/20    Authorization Type UHC Medicare    PT Start Time 1430    PT Stop Time 1510    PT Time Calculation (min) 40 min    Activity Tolerance Patient tolerated treatment well    Behavior During Therapy [facility] for tasks assessed/performed           Past Medical History:  Diagnosis Date  . Anxiety   . Arthritis   . Depression   . Family history of adverse reaction to anesthesia 2012   Mother has cardiac  arrest after surgery  . Hepatitis    high antibodies for hepatitis  . Obesity     Past Surgical History:  Procedure Laterality Date  . LAPAROSCOPIC TUBAL LIGATION  1985  . TOTAL KNEE ARTHROPLASTY Right 04/15/2020   Procedure: TOTAL KNEE ARTHROPLASTY;  Surgeon: Teryl Lucy, MD;  Location: [facility];  Service: Orthopedics;  Laterality: Right;  . WISDOM TOOTH EXTRACTION     age 89    There were no vitals filed for this visit.   Subjective Assessment - 05/20/20 1443    Subjective Patient reports she feels like she has truned a corner. She worked over the weekend in the house and only has a minor increase in pain.    Limitations Walking    How long can you sit comfortably? n/a    How long can you stand comfortably? This feels the worse. ~5 min before needing to sit.    How long can you walk comfortably? ~4 blocks was the limit    Patient Stated Goals Improve R LE knee strength, walking and standing endurance    Currently in Pain? Yes    Pain Score 1     Pain Location Knee    Pain Orientation Left    Pain Descriptors / Indicators Aching     Pain Onset More than a month ago    Pain Frequency Intermittent    Aggravating Factors  standing and walking    Pain Relieving Factors rest    Effect of Pain on Daily Activities difficulty standing and walking    Multiple Pain Sites No                             [facility] Adult PT Treatment/Exercise - 05/21/20 0001      Knee/Hip Exercises: Machines for Strengthening   Total Gym Leg Press leg press 30 lbs      Knee/Hip Exercises: Standing   Heel Raises Left;Right;2 sets;10 reps    Lateral Step Up Limitations x15 6 inch    Forward Step Up Limitations x15 6 inch    Step Down Limitations x15 4 inch    Other Standing Knee Exercises slow march 2x15      Knee/Hip Exercises: Seated   Long Arc Quad Strengthening;2 sets;10 reps    Hamstring Curl Strengthening;Right;Left;2 sets;10 reps      Knee/Hip Exercises: Supine   Short Arc Quad Sets Limitations 3x10    Straight Leg Raises Limitations 2x15  Manual Therapy   Manual Therapy Passive ROM    Passive ROM R knee flexion                  PT Education - 05/21/20 1624    Education Details reviewed goals and activity progression    Person(s) Educated Patient    Methods Explanation;Demonstration;Tactile cues;Verbal cues    Comprehension Verbalized understanding;Returned demonstration;Verbal cues required;Tactile cues required            PT Short Term Goals - 04/29/20 1538      PT SHORT TERM GOAL #1   Title Pt will be independent with HEP    Time 4    Period Weeks    Status New    Target Date 05/27/20      PT SHORT TERM GOAL #2   Title Pt will be able to walk with normal reciprocal gait pattern    Baseline Mildly antalgic    Time 4    Period Days    Status New    Target Date 05/27/20      PT SHORT TERM GOAL #3   Title Pt will be able to demo R knee AROM of 0 to >105 deg    Baseline 2 to 96 deg    Time 4    Period Weeks    Status New    Target Date 05/27/20      PT SHORT TERM GOAL #4    Title --    Baseline --    Time --    Period --    Status --    Target Date --             PT Long Term Goals - 04/29/20 1540      PT LONG TERM GOAL #1   Title Pt will be independent with final HEP    Time 8    Period Weeks    Status New    Target Date 06/24/20      PT LONG TERM GOAL #2   Title Pt will be able to perform 5x STS in <13 sec to demo increased functional strength and balance    Time 8    Period Weeks    Status New    Target Date 06/24/20      PT LONG TERM GOAL #3   Title Pt will be able to perform steps at home with normal reciprocal pattern and no hand rail    Baseline Needs hand rail due to steep steps at home    Time 8    Period Weeks    Status New    Target Date 06/24/20      PT LONG TERM GOAL #4   Title Pt will be able to tolerate standing at least 15 minutes for improved home and community mobility    Baseline Prior to TKA, only able to tolerate ~5 min    Time 8    Period Weeks    Status New    Target Date 06/24/20                 Plan - 05/21/20 1625    Clinical Impression Statement Patient continues to make good progress. She had full range today. She tolerated a 6 inch step well and stepped down a 4 inch step. She is hesitant to squat so we started her on a light leg press. She did well with the leg press. Her left knee is more of a problem then the  right. Therapy will continues to progress as tolerated and as the left leg tolerates.    Personal Factors and Comorbidities Age;Fitness;Past/Current Experience;Time since onset of injury/illness/exacerbation    Examination-Activity Limitations Transfers;Locomotion Level;Stairs;Stand;Hygiene/Grooming    Examination-Participation Restrictions Community Activity;Driving;Cleaning    Stability/Clinical Decision Making Stable/Uncomplicated    Clinical Decision Making Low    Rehab Potential Good    PT Frequency 2x / week    PT Duration 8 weeks    PT Treatment/Interventions ADLs/Self Care Home  Management;Therapeutic exercise;Aquatic Therapy;Cryotherapy;Iontophoresis 4mg /ml Dexamethasone;Moist Heat;Ultrasound;DME Instruction;Electrical Stimulation;Gait training;Stair training;Functional mobility training;Therapeutic activities;Balance training;Neuromuscular re-education;Manual techniques;Patient/family education;Orthotic Fit/Training;Scar mobilization;Dry needling;Energy conservation;Taping;Vasopneumatic Device    PT Next Visit Plan Assess response to updated HEP. Continue hip, quad, and core strengthening (progress into standing!).    PT Home Exercise Plan Access Code    Consulted and Agree with Plan of Care Patient;Family member/caregiver           Patient will benefit from skilled therapeutic intervention in order to improve the following deficits and impairments:  Abnormal gait,Increased fascial restricitons,Pain,Decreased mobility,Decreased activity tolerance,Decreased range of motion,Decreased strength,Hypomobility,Decreased balance,Difficulty walking,Increased edema,Decreased endurance,Impaired flexibility,Decreased scar mobility  Visit Diagnosis: Status post total right knee replacement  Muscle weakness (generalized)  Chronic pain of left knee  Stiffness of right knee, not elsewhere classified  Stiffness of left knee, not elsewhere classified  Chronic pain of right knee     Problem List Patient Active Problem List   Diagnosis Date Noted  . S/P total knee replacement, right 04/15/2020  . Osteopenia 04/10/2020  . Major depressive disorder, recurrent episode, mild (HCC) 10/31/2017  . ADHD, predominantly inattentive type 10/31/2017  . Primary osteoarthritis of right knee 09/09/2016  . Primary osteoarthritis, right shoulder 09/09/2016  . Arthritis 07/25/2012  . Tinnitus 07/25/2012  . Mixed incontinence urge and stress 07/25/2012    09/25/2012 PT DPT  05/21/2020, 4:29 PM  [facility] [address] [place], [place], [place] Phone: [phone]   Fax:  [phone]  Name: Megan Nunez MRN: [name] Date of Birth: 29-Sep-1945

## 2020-05-23 ENCOUNTER — Encounter (HOSPITAL_BASED_OUTPATIENT_CLINIC_OR_DEPARTMENT_OTHER): Payer: Self-pay | Admitting: Physical Therapy

## 2020-05-23 ENCOUNTER — Ambulatory Visit (HOSPITAL_BASED_OUTPATIENT_CLINIC_OR_DEPARTMENT_OTHER): Payer: Medicare Other | Admitting: Physical Therapy

## 2020-05-23 ENCOUNTER — Other Ambulatory Visit: Payer: Self-pay

## 2020-05-23 DIAGNOSIS — M25562 Pain in left knee: Secondary | ICD-10-CM | POA: Diagnosis not present

## 2020-05-23 DIAGNOSIS — M25661 Stiffness of right knee, not elsewhere classified: Secondary | ICD-10-CM

## 2020-05-23 DIAGNOSIS — M25561 Pain in right knee: Secondary | ICD-10-CM | POA: Diagnosis not present

## 2020-05-23 DIAGNOSIS — G8929 Other chronic pain: Secondary | ICD-10-CM

## 2020-05-23 DIAGNOSIS — Z96651 Presence of right artificial knee joint: Secondary | ICD-10-CM | POA: Diagnosis not present

## 2020-05-23 DIAGNOSIS — M25662 Stiffness of left knee, not elsewhere classified: Secondary | ICD-10-CM

## 2020-05-23 DIAGNOSIS — M6281 Muscle weakness (generalized): Secondary | ICD-10-CM

## 2020-05-23 NOTE — Therapy (Signed)
[facility] [address] [place], [place], [address] Phone: [phone]   Fax:  [phone]  Physical Therapy Treatment  Patient Details  Name: Megan Nunez MRN: [id number] Date of Birth: March 03, 1945 Referring Provider (PT): Teryl Lucy, MD   Encounter Date: 05/23/2020   PT End of Session - 05/23/20 1435    Visit Number 8    Number of Visits 17    Date for PT Re-Evaluation 06/24/20    Authorization Type UHC Medicare progress note in 2 visits    PT Start Time 1430    PT Stop Time 1509    PT Time Calculation (min) 39 min    Activity Tolerance Patient tolerated treatment well    Behavior During Therapy [facility] for tasks assessed/performed           Past Medical History:  Diagnosis Date  . Anxiety   . Arthritis   . Depression   . Family history of adverse reaction to anesthesia 2012   Mother has cardiac  arrest after surgery  . Hepatitis    high antibodies for hepatitis  . Obesity     Past Surgical History:  Procedure Laterality Date  . LAPAROSCOPIC TUBAL LIGATION  1985  . TOTAL KNEE ARTHROPLASTY Right 04/15/2020   Procedure: TOTAL KNEE ARTHROPLASTY;  Surgeon: Teryl Lucy, MD;  Location: [facility];  Service: Orthopedics;  Laterality: Right;  . WISDOM TOOTH EXTRACTION     age 75    There were no vitals filed for this visit.   Subjective Assessment - 05/23/20 1432    Subjective Patient reports the pain that concerned her in the top of her knee has resolved. She reports that her left knee has hurt significantly. She feels like it is getting close to the time that she will be having the proecedure.    Limitations Walking    How long can you sit comfortably? n/a    How long can you stand comfortably? This feels the worse. ~5 min before needing to sit.    Patient Stated Goals Improve R LE knee strength, walking and standing endurance    Currently in Pain? No/denies              [facility] PT Assessment - 05/23/20 0001       AROM   Left Knee Extension 0    Left Knee Flexion 121      Strength   Right Hip Flexion 5/5    Right Hip Extension 5/5    Right Hip ABduction 5/5    Right Knee Flexion 5/5    Right Knee Extension 5/5                         [facility] Adult PT Treatment/Exercise - 05/23/20 0001      Knee/Hip Exercises: Aerobic   Nustep 5 min L3      Knee/Hip Exercises: Machines for Strengthening   Cybex Knee Extension 2x10 10 lbs    Total Gym Leg Press leg press 30 lbs life ftness 25 lbs 2x10    Other Machine hip abduction machine 2x15 40 lbs      Knee/Hip Exercises: Standing   Heel Raises Left;Right;2 sets;10 reps    Lateral Step Up Limitations x15 6 inch    Forward Step Up Limitations x15 6 inch    Step Down Limitations 2x15 4 inch    Other Standing Knee Exercises slow march 2x15      Knee/Hip Exercises: Seated  Long Arc [employer] Strengthening;2 sets;10 reps    Long Arc [employer] 1 lbs.      Knee/Hip Exercises: Supine   Short Arc Quad Sets Limitations 3x10 1lb    Straight Leg Raises Limitations 2x15 1lb      Manual Therapy   Passive ROM full range noted                  PT Education - 05/23/20 1434    Education Details reviewed progression of exercises    Person(s) Educated Patient    Methods Explanation;Demonstration;Tactile cues;Verbal cues    Comprehension Verbalized understanding;Returned demonstration;Verbal cues required;Tactile cues required            PT Short Term Goals - 04/29/20 1538      PT SHORT TERM GOAL #1   Title Pt will be independent with HEP    Time 4    Period Weeks    Status New    Target Date 05/27/20      PT SHORT TERM GOAL #2   Title Pt will be able to walk with normal reciprocal gait pattern    Baseline Mildly antalgic    Time 4    Period Days    Status New    Target Date 05/27/20      PT SHORT TERM GOAL #3   Title Pt will be able to demo R knee AROM of 0 to >105 deg    Baseline 2 to 96 deg    Time 4    Period  Weeks    Status New    Target Date 05/27/20      PT SHORT TERM GOAL #4   Title --    Baseline --    Time --    Period --    Status --    Target Date --             PT Long Term Goals - 04/29/20 1540      PT LONG TERM GOAL #1   Title Pt will be independent with final HEP    Time 8    Period Weeks    Status New    Target Date 06/24/20      PT LONG TERM GOAL #2   Title Pt will be able to perform 5x STS in <13 sec to demo increased functional strength and balance    Time 8    Period Weeks    Status New    Target Date 06/24/20      PT LONG TERM GOAL #3   Title Pt will be able to perform steps at home with normal reciprocal pattern and no hand rail    Baseline Needs hand rail due to steep steps at home    Time 8    Period Weeks    Status New    Target Date 06/24/20      PT LONG TERM GOAL #4   Title Pt will be able to tolerate standing at least 15 minutes for improved home and community mobility    Baseline Prior to TKA, only able to tolerate ~5 min    Time 8    Period Weeks    Status New    Target Date 06/24/20                 Plan - 05/23/20 1435    Clinical Impression Statement Patient continues to make great progress with her right knee. Her left knee is still giving her problems.  Therapy was able to add weight to her table exercises>She continues to tolerate stair training well. She is nearing the end of her program for her right knee We have also reviewed some exercises she can do for prehab for her left knee. She is also interested in aquatic therapy if the MD feels her wound has healed enough. The main functional tasks we are working on at this point is going dwn steps and squats. Today we worked on [employer] that she can continue to work on upon D/C. No increase in pain noted.    Personal Factors and Comorbidities Age;Fitness;Past/Current Experience;Time since onset of injury/illness/exacerbation    Examination-Activity Limitations  Transfers;Locomotion Level;Stairs;Stand;Hygiene/Grooming    Examination-Participation Restrictions Community Activity;Driving;Cleaning    Stability/Clinical Decision Making Stable/Uncomplicated    Clinical Decision Making Low    Rehab Potential Good    PT Frequency 2x / week    PT Duration 8 weeks    PT Treatment/Interventions ADLs/Self Care Home Management;Therapeutic exercise;Aquatic Therapy;Cryotherapy;Iontophoresis 4mg /ml Dexamethasone;Moist Heat;Ultrasound;DME Instruction;Electrical Stimulation;Gait training;Stair training;Functional mobility training;Therapeutic activities;Balance training;Neuromuscular re-education;Manual techniques;Patient/family education;Orthotic Fit/Training;Scar mobilization;Dry needling;Energy conservation;Taping;Vasopneumatic Device    PT Next Visit Plan Assess response to updated HEP. Continue hip, quad, and core strengthening (progress into standing!).    PT Home Exercise Plan Access Code    Consulted and Agree with Plan of Care Patient    Family Member Consulted Husband           Patient will benefit from skilled therapeutic intervention in order to improve the following deficits and impairments:  Abnormal gait,Increased fascial restricitons,Pain,Decreased mobility,Decreased activity tolerance,Decreased range of motion,Decreased strength,Hypomobility,Decreased balance,Difficulty walking,Increased edema,Decreased endurance,Impaired flexibility,Decreased scar mobility  Visit Diagnosis: Status post total right knee replacement  Muscle weakness (generalized)  Chronic pain of left knee  Stiffness of right knee, not elsewhere classified  Stiffness of left knee, not elsewhere classified  Chronic pain of right knee     Problem List Patient Active Problem List   Diagnosis Date Noted  . S/P total knee replacement, right 04/15/2020  . Osteopenia 04/10/2020  . Major depressive disorder, recurrent episode, mild (HCC) 10/31/2017  . ADHD,  predominantly inattentive type 10/31/2017  . Primary osteoarthritis of right knee 09/09/2016  . Primary osteoarthritis, right shoulder 09/09/2016  . Arthritis 07/25/2012  . Tinnitus 07/25/2012  . Mixed incontinence urge and stress 07/25/2012    09/25/2012 05/23/2020, 3:39 PM  [facility] [address] [place], [place], [place] Phone: [phone]   Fax:  [phone]  Name: KYRIE BUN MRN: [name] Date of Birth: 11-May-1945

## 2020-05-26 DIAGNOSIS — M1712 Unilateral primary osteoarthritis, left knee: Secondary | ICD-10-CM | POA: Diagnosis not present

## 2020-05-27 ENCOUNTER — Ambulatory Visit (HOSPITAL_BASED_OUTPATIENT_CLINIC_OR_DEPARTMENT_OTHER): Payer: Medicare Other | Admitting: Physical Therapy

## 2020-05-27 ENCOUNTER — Other Ambulatory Visit: Payer: Self-pay

## 2020-05-27 ENCOUNTER — Encounter (HOSPITAL_BASED_OUTPATIENT_CLINIC_OR_DEPARTMENT_OTHER): Payer: Self-pay | Admitting: Physical Therapy

## 2020-05-27 DIAGNOSIS — Z96651 Presence of right artificial knee joint: Secondary | ICD-10-CM | POA: Diagnosis not present

## 2020-05-27 DIAGNOSIS — M25562 Pain in left knee: Secondary | ICD-10-CM | POA: Diagnosis not present

## 2020-05-27 DIAGNOSIS — M25561 Pain in right knee: Secondary | ICD-10-CM | POA: Diagnosis not present

## 2020-05-27 DIAGNOSIS — M25661 Stiffness of right knee, not elsewhere classified: Secondary | ICD-10-CM | POA: Diagnosis not present

## 2020-05-27 DIAGNOSIS — M6281 Muscle weakness (generalized): Secondary | ICD-10-CM | POA: Diagnosis not present

## 2020-05-27 DIAGNOSIS — M1712 Unilateral primary osteoarthritis, left knee: Secondary | ICD-10-CM | POA: Diagnosis present

## 2020-05-27 DIAGNOSIS — G8929 Other chronic pain: Secondary | ICD-10-CM | POA: Diagnosis not present

## 2020-05-27 DIAGNOSIS — M25662 Stiffness of left knee, not elsewhere classified: Secondary | ICD-10-CM | POA: Diagnosis not present

## 2020-05-27 NOTE — Therapy (Signed)
[facility] [address] [place], [place], [address] Phone: [phone]   Fax:  [phone]  Physical Therapy Treatment  Patient Details  Name: Megan Nunez MRN: [id number] Date of Birth: March 23, 1945 Referring Provider (PT): Teryl Lucy, MD   Encounter Date: 05/27/2020   PT End of Session - 05/27/20 2101    Visit Number 9    Number of Visits 17    Date for PT Re-Evaluation 06/24/20    Authorization Type UHC Medicare progress note in 2 visits    PT Start Time 1430    PT Stop Time 1510    PT Time Calculation (min) 40 min    Activity Tolerance Patient tolerated treatment well    Behavior During Therapy [facility] for tasks assessed/performed           Past Medical History:  Diagnosis Date  . Anxiety   . Arthritis   . Depression   . Family history of adverse reaction to anesthesia 2012   Mother has cardiac  arrest after surgery  . Hepatitis    high antibodies for hepatitis  . Obesity     Past Surgical History:  Procedure Laterality Date  . LAPAROSCOPIC TUBAL LIGATION  1985  . TOTAL KNEE ARTHROPLASTY Right 04/15/2020   Procedure: TOTAL KNEE ARTHROPLASTY;  Surgeon: Teryl Lucy, MD;  Location: [facility];  Service: Orthopedics;  Laterality: Right;  . WISDOM TOOTH EXTRACTION     age 84    There were no vitals filed for this visit.   Subjective Assessment - 05/27/20 1452    Subjective Patient had a good weekend. She reports her right knee is doing well. her left knee is hurting. She has the surgery scheduled for August.    Limitations Walking    How long can you sit comfortably? n/a    How long can you stand comfortably? This feels the worse. ~5 min before needing to sit.    How long can you walk comfortably? ~4 blocks was the limit    Patient Stated Goals Improve R LE knee strength, walking and standing endurance    Multiple Pain Sites No                             [facility] Adult PT Treatment/Exercise  - 05/27/20 0001      Therapeutic Activites    Therapeutic Activities Other Therapeutic Activities    Other Therapeutic Activities reviewed proper squatitng technique.      Knee/Hip Exercises: Aerobic   Nustep 5 min L3      Knee/Hip Exercises: Standing   Heel Raises Left;Right;2 sets;10 reps    Lateral Step Up Limitations x15 6 inch    Forward Step Up Limitations x15 6 inch    Step Down Limitations 2x15 4 inch    Other Standing Knee Exercises slow march 2x15      Knee/Hip Exercises: Seated   Long Arc Quad Strengthening;2 sets;10 reps    Long Arc Quad Weight 1 lbs.    Long [employer] Limitations 2x15      Knee/Hip Exercises: Supine   Short Arc Quad Sets Limitations 2x15 1 lb    Straight Leg Raises Limitations 2x15 1lb    Other Supine Knee/Hip Exercises 2x15      Manual Therapy   Passive ROM full range noted                  PT Education - 05/27/20 2100  Education Details reviewed squatting technique    Person(s) Educated Patient    Methods Explanation;Demonstration;Tactile cues;Verbal cues    Comprehension Verbalized understanding;Returned demonstration;Verbal cues required;Tactile cues required            PT Short Term Goals - 04/29/20 1538      PT SHORT TERM GOAL #1   Title Pt will be independent with HEP    Time 4    Period Weeks    Status New    Target Date 05/27/20      PT SHORT TERM GOAL #2   Title Pt will be able to walk with normal reciprocal gait pattern    Baseline Mildly antalgic    Time 4    Period Days    Status New    Target Date 05/27/20      PT SHORT TERM GOAL #3   Title Pt will be able to demo R knee AROM of 0 to >105 deg    Baseline 2 to 96 deg    Time 4    Period Weeks    Status New    Target Date 05/27/20      PT SHORT TERM GOAL #4   Title --    Baseline --    Time --    Period --    Status --    Target Date --             PT Long Term Goals - 04/29/20 1540      PT LONG TERM GOAL #1   Title Pt will be  independent with final HEP    Time 8    Period Weeks    Status New    Target Date 06/24/20      PT LONG TERM GOAL #2   Title Pt will be able to perform 5x STS in <13 sec to demo increased functional strength and balance    Time 8    Period Weeks    Status New    Target Date 06/24/20      PT LONG TERM GOAL #3   Title Pt will be able to perform steps at home with normal reciprocal pattern and no hand rail    Baseline Needs hand rail due to steep steps at home    Time 8    Period Weeks    Status New    Target Date 06/24/20      PT LONG TERM GOAL #4   Title Pt will be able to tolerate standing at least 15 minutes for improved home and community mobility    Baseline Prior to TKA, only able to tolerate ~5 min    Time 8    Period Weeks    Status New    Target Date 06/24/20                 Plan - 05/27/20 2103    Clinical Impression Statement therapy reviewed squating technique with the patient. She did well. She demonstrated good balance. with squats and she was bale to keep her weight back. We will continue to progress fucntional activity as tolerated.    Personal Factors and Comorbidities Age;Fitness;Past/Current Experience;Time since onset of injury/illness/exacerbation    Examination-Activity Limitations Transfers;Locomotion Level;Stairs;Stand;Hygiene/Grooming    Stability/Clinical Decision Making Stable/Uncomplicated    Clinical Decision Making Low    Rehab Potential Good    PT Frequency 2x / week    PT Duration 8 weeks    PT Treatment/Interventions ADLs/Self Care Home Management;Therapeutic exercise;Aquatic  Therapy;Cryotherapy;Iontophoresis 4mg /ml Dexamethasone;Moist Heat;Ultrasound;DME Instruction;Electrical Stimulation;Gait training;Stair training;Functional mobility training;Therapeutic activities;Balance training;Neuromuscular re-education;Manual techniques;Patient/family education;Orthotic Fit/Training;Scar mobilization;Dry needling;Energy  conservation;Taping;Vasopneumatic Device    PT Next Visit Plan Assess response to updated HEP. Continue hip, quad, and core strengthening (progress into standing!).    PT Home Exercise Plan Access Code    Consulted and Agree with Plan of Care Patient    Family Member Consulted Husband           Patient will benefit from skilled therapeutic intervention in order to improve the following deficits and impairments:  Abnormal gait,Increased fascial restricitons,Pain,Decreased mobility,Decreased activity tolerance,Decreased range of motion,Decreased strength,Hypomobility,Decreased balance,Difficulty walking,Increased edema,Decreased endurance,Impaired flexibility,Decreased scar mobility  Visit Diagnosis: Status post total right knee replacement  Muscle weakness (generalized)  Chronic pain of left knee     Problem List Patient Active Problem List   Diagnosis Date Noted  . Osteoarthritis of left knee 05/27/2020  . S/P total knee replacement, right 04/15/2020  . Osteopenia 04/10/2020  . Major depressive disorder, recurrent episode, mild (HCC) 10/31/2017  . ADHD, predominantly inattentive type 10/31/2017  . Primary osteoarthritis of right knee 09/09/2016  . Primary osteoarthritis, right shoulder 09/09/2016  . Arthritis 07/25/2012  . Tinnitus 07/25/2012  . Mixed incontinence urge and stress 07/25/2012    09/25/2012 PT  DPT  05/27/2020, 9:09 PM  [facility] [address] [place], [place], [place] Phone: [phone]   Fax:  [phone]  Name: Megan Nunez MRN: [name] Date of Birth: January 31, 1945

## 2020-05-28 ENCOUNTER — Telehealth: Payer: Self-pay | Admitting: Medical

## 2020-05-28 ENCOUNTER — Ambulatory Visit (INDEPENDENT_AMBULATORY_CARE_PROVIDER_SITE_OTHER): Payer: Medicare Other | Admitting: Medical

## 2020-05-28 ENCOUNTER — Encounter: Payer: Self-pay | Admitting: Medical

## 2020-05-28 VITALS — BP 142/70 | HR 77 | Temp 98.4°F | Ht 60.0 in | Wt 162.2 lb

## 2020-05-28 DIAGNOSIS — H01131 Eczematous dermatitis of right upper eyelid: Secondary | ICD-10-CM | POA: Insufficient documentation

## 2020-05-28 DIAGNOSIS — H01134 Eczematous dermatitis of left upper eyelid: Secondary | ICD-10-CM

## 2020-05-28 DIAGNOSIS — Z9889 Other specified postprocedural states: Secondary | ICD-10-CM | POA: Diagnosis not present

## 2020-05-28 DIAGNOSIS — L089 Local infection of the skin and subcutaneous tissue, unspecified: Secondary | ICD-10-CM | POA: Diagnosis not present

## 2020-05-28 DIAGNOSIS — T148XXA Other injury of unspecified body region, initial encounter: Secondary | ICD-10-CM | POA: Insufficient documentation

## 2020-05-28 MED ORDER — CEPHALEXIN 500 MG PO CAPS: 500.0000 mg | ORAL_CAPSULE | Freq: Three times a day (TID) | ORAL | 0 refills | Status: DC

## 2020-05-28 NOTE — Telephone Encounter (Signed)
Please call her back.  In reference to the cream for her eyelids, her pharmacy has no record of that mometasone cream prescription  If she is absolutely sure of the cream she was prescribed by dermatology for her eyelids, please have her either take a picture of the label in my chart upload this or read off exactly what the label says including the name of the drug, the %, and whether it is a cream or ointment  I do not feel comfortable prescribing mometasone for her eyelids unless we know exactly what the label said in the what the directions were per dermatology  I personally would recommend she use Aquaphor or Cetaphil cream or other daily moisturizing lotion such as a Shea butter based lotion for example  Is not typically healthy to use long-term steroids on a thin layer of skin such as eyelids.  Activity would only have someone use Cortaid over-the-counter for flareups but typically we would use this every single day ongoing

## 2020-05-28 NOTE — Telephone Encounter (Signed)
Patient stated she will respond via mychart.

## 2020-05-28 NOTE — Progress Notes (Signed)
Subjective:  Megan Nunez is a 75 y.o. female who presents for Chief Complaint  Patient presents with  . Leg Swelling    Possible infection in left leg      Here for concern for skin infection.  She notes that she may have torn some of her skin on her left lateral calf with her fingernail.  She denies any other specific injury.  She notes having thin skin in general.  This happened about 2 days ago and there is some redness and tenderness that feels a little unusual.  She is worried about infection.  She is using hygiene and cleaning but no specific treatment otherwise.  No fever no nausea or vomiting, no chills.  She had right knee surgery 6 weeks ago and doing well with this.  Still has little swelling in the knee from inflammation and healing.  She denies any calf pain otherwise, no other calf swelling or leg swelling.  No history of blood clots.  She is doing well with rehab currently within the knee.    She notes history of dry itchy skin of upper eyelids.  In the past dermatology prescribed mometasone cream.  This has always worked well but is almost out.  Would like refill on this.     No other aggravating or relieving factors.    No other c/o.  The following portions of the patient's history were reviewed and updated as appropriate: allergies, current medications, past family history, past medical history, past social history, past surgical history and problem list.  ROS Otherwise as in subjective above    Objective: BP (!) 142/70   Pulse 77   Ht 5' (1.524 m)   Wt 162 lb 3.2 oz (73.6 kg)   SpO2 98%   BMI 31.68 kg/m   General appearance: alert, no distress, well developed, well nourished Eyelids with no obvious lesion or rash or deformity.  Rest of HEENT unremarkable, conjunctival normal, eyes PERRLA, EOMI Left lateral leg proximal third with what appears to be a approximately 2 cm x 1 cm skin avulsion type wound that has re approximated.  There is some redness in dried  blood at the wound itself but no surrounding erythema, no fluctuance, no induration.  No calf asymmetry, no calf swelling, no tenderness, no discoloration Right knee with healing vertical surgical scar and mild localized welling, but no diffuse swelling, no posterior leg or calf swelling, negative homans Legs otherwise neurovascularly intact      Assessment: Encounter Diagnoses  Name Primary?  . Skin infection Yes  . Wound of skin   . S/P knee surgery   . Eczematous dermatitis of upper eyelids of both eyes      Plan: We discussed symptoms and concerns.  We discussed no obvious cellulitis of the leg but there is a localized tear in the skin with potential for infection.  She will use good hygiene, soap and water, triple antibiotic over-the-counter topically.  She will begin medication below.  We discussed symptoms or signs that would suggest worsening infection or cellulitis.  If this happens she will recheck right away.    Eczema /itchy skin of eyelids.  I advised that I did not feel comfortable prescribing Mometasone topically.   She notes prior treatment prn script through dermatology.  I asked her to get me the tube of cream to compared.   We discussed daily moisturizing lotion, occasional use of Cortaid OTC, but recommended against regular use of stronger steroid on face or eyelid.  Marco was seen today for leg swelling.  Diagnoses and all orders for this visit:  Skin infection  Wound of skin  S/P knee surgery  Eczematous dermatitis of upper eyelids of both eyes  Other orders -     cephALEXin (KEFLEX) 500 MG capsule; Take 1 capsule (500 mg total) by mouth 3 (three) times daily.   Follow up: prn

## 2020-06-03 ENCOUNTER — Ambulatory Visit (HOSPITAL_BASED_OUTPATIENT_CLINIC_OR_DEPARTMENT_OTHER): Payer: Medicare Other | Admitting: Physical Therapy

## 2020-06-03 ENCOUNTER — Encounter (HOSPITAL_BASED_OUTPATIENT_CLINIC_OR_DEPARTMENT_OTHER): Payer: Self-pay | Admitting: Physical Therapy

## 2020-06-03 ENCOUNTER — Other Ambulatory Visit: Payer: Self-pay

## 2020-06-03 DIAGNOSIS — M6281 Muscle weakness (generalized): Secondary | ICD-10-CM

## 2020-06-03 DIAGNOSIS — M25561 Pain in right knee: Secondary | ICD-10-CM | POA: Diagnosis not present

## 2020-06-03 DIAGNOSIS — M25661 Stiffness of right knee, not elsewhere classified: Secondary | ICD-10-CM | POA: Diagnosis not present

## 2020-06-03 DIAGNOSIS — Z96651 Presence of right artificial knee joint: Secondary | ICD-10-CM

## 2020-06-03 DIAGNOSIS — G8929 Other chronic pain: Secondary | ICD-10-CM

## 2020-06-03 DIAGNOSIS — M25662 Stiffness of left knee, not elsewhere classified: Secondary | ICD-10-CM | POA: Diagnosis not present

## 2020-06-03 DIAGNOSIS — M25562 Pain in left knee: Secondary | ICD-10-CM

## 2020-06-04 ENCOUNTER — Encounter (HOSPITAL_BASED_OUTPATIENT_CLINIC_OR_DEPARTMENT_OTHER): Payer: Medicare Other | Admitting: Physical Therapy

## 2020-06-04 ENCOUNTER — Encounter (HOSPITAL_BASED_OUTPATIENT_CLINIC_OR_DEPARTMENT_OTHER): Payer: Self-pay | Admitting: Physical Therapy

## 2020-06-04 NOTE — Therapy (Signed)
[facility] [address] [place], [place], [address] Phone: [phone]   Fax:  [phone]  Physical Therapy Treatment  Patient Details  Name: Megan Nunez MRN: [id number] Date of Birth: [birth year] Referring Provider (PT): Teryl Lucy, MD   Encounter Date: 06/03/2020   PT End of Session - 06/03/20 1444    Visit Number 10    Number of Visits 17    Date for PT Re-Evaluation 06/24/20    Authorization Type UHC Medicare progress note in 2 visits    PT Start Time 1435    PT Stop Time 1515    PT Time Calculation (min) 40 min    Activity Tolerance Patient tolerated treatment well    Behavior During Therapy [facility] for tasks assessed/performed           Past Medical History:  Diagnosis Date  . Anxiety   . Arthritis   . Depression   . Family history of adverse reaction to anesthesia 2012   Mother has cardiac  arrest after surgery  . Hepatitis    high antibodies for hepatitis  . Obesity     Past Surgical History:  Procedure Laterality Date  . LAPAROSCOPIC TUBAL LIGATION  1985  . TOTAL KNEE ARTHROPLASTY Right 04/15/2020   Procedure: TOTAL KNEE ARTHROPLASTY;  Surgeon: Teryl Lucy, MD;  Location: [facility];  Service: Orthopedics;  Laterality: Right;  . WISDOM TOOTH EXTRACTION     age 23    There were no vitals filed for this visit.   Subjective Assessment - 06/03/20 1440    Subjective Patient is doing well with the right knee but her left knee is hurting. She may try to get her surgery moved up.    Limitations Walking    How long can you sit comfortably? n/a    How long can you stand comfortably? This feels the worse. ~5 min before needing to sit.    How long can you walk comfortably? ~4 blocks was the limit    Patient Stated Goals Improve R LE knee strength, walking and standing endurance    Currently in Pain? No/denies                             [facility] Adult PT Treatment/Exercise - 06/04/20 0001       Knee/Hip Exercises: Aerobic   Nustep 5 min L3      Knee/Hip Exercises: Standing   Heel Raises Left;Right;2 sets;10 reps    Lateral Step Up Limitations x15 6 inch    Forward Step Up Limitations x15 6 inch    Step Down Limitations 2x15 4 inch    Other Standing Knee Exercises slow march 2x15      Knee/Hip Exercises: Seated   Long Arc Quad Strengthening;2 sets;10 reps    Long Arc Quad Weight 1 lbs.    Long Arc [employer] Limitations 2x15      Knee/Hip Exercises: Supine   Short Arc Quad Sets Limitations 2x15 2lb    Straight Leg Raises Limitations 2x15 2lbs    Other Supine Knee/Hip Exercises 2x15      Manual Therapy   Passive ROM full range noted                  PT Education - 06/03/20 1441    Education Details HEP and symptm mangement    Person(s) Educated Patient    Methods Explanation;Demonstration;Tactile cues;Verbal cues    Comprehension Verbalized understanding;Returned  demonstration;Verbal cues required;Tactile cues required            PT Short Term Goals - 04/29/20 1538      PT SHORT TERM GOAL #1   Title Pt will be independent with HEP    Time 4    Period Weeks    Status New    Target Date 05/27/20      PT SHORT TERM GOAL #2   Title Pt will be able to walk with normal reciprocal gait pattern    Baseline Mildly antalgic    Time 4    Period Days    Status New    Target Date 05/27/20      PT SHORT TERM GOAL #3   Title Pt will be able to demo R knee AROM of 0 to >105 deg    Baseline 2 to 96 deg    Time 4    Period Weeks    Status New    Target Date 05/27/20      PT SHORT TERM GOAL #4   Title --    Baseline --    Time --    Period --    Status --    Target Date --             PT Long Term Goals - 04/29/20 1540      PT LONG TERM GOAL #1   Title Pt will be independent with final HEP    Time 8    Period Weeks    Status New    Target Date 06/24/20      PT LONG TERM GOAL #2   Title Pt will be able to perform 5x STS in <13 sec to demo  increased functional strength and balance    Time 8    Period Weeks    Status New    Target Date 06/24/20      PT LONG TERM GOAL #3   Title Pt will be able to perform steps at home with normal reciprocal pattern and no hand rail    Baseline Needs hand rail due to steep steps at home    Time 8    Period Weeks    Status New    Target Date 06/24/20      PT LONG TERM GOAL #4   Title Pt will be able to tolerate standing at least 15 minutes for improved home and community mobility    Baseline Prior to TKA, only able to tolerate ~5 min    Time 8    Period Weeks    Status New    Target Date 06/24/20                 Plan - 06/04/20 1228    Clinical Impression Statement Patient continues to make great progress. She is having very little pain in her right knee. Her left knee is still significantly painful. She will be having surgery on that soon. She will likley D/C next week. We will continue towwork on steps and squats.    Personal Factors and Comorbidities Age;Fitness;Past/Current Experience;Time since onset of injury/illness/exacerbation    Examination-Activity Limitations Transfers;Locomotion Level;Stairs;Stand;Hygiene/Grooming    Examination-Participation Restrictions Community Activity;Driving;Cleaning    Stability/Clinical Decision Making Stable/Uncomplicated    Clinical Decision Making Low    Rehab Potential Good    PT Frequency 2x / week    PT Duration 8 weeks    PT Treatment/Interventions ADLs/Self Care Home Management;Therapeutic exercise;Aquatic Therapy;Cryotherapy;Iontophoresis 4mg /ml Dexamethasone;Moist Heat;Ultrasound;DME Instruction;Electrical Stimulation;Gait  training;Stair training;Functional mobility training;Therapeutic activities;Balance training;Neuromuscular re-education;Manual techniques;Patient/family education;Orthotic Fit/Training;Scar mobilization;Dry needling;Energy conservation;Taping;Vasopneumatic Device    PT Next Visit Plan Assess response to updated  HEP. Continue hip, quad, and core strengthening (progress into standing!).    PT Home Exercise Plan Access Code [email or web address]    Consulted and Agree with Plan of Care Patient           Patient will benefit from skilled therapeutic intervention in order to improve the following deficits and impairments:  Abnormal gait,Increased fascial restricitons,Pain,Decreased mobility,Decreased activity tolerance,Decreased range of motion,Decreased strength,Hypomobility,Decreased balance,Difficulty walking,Increased edema,Decreased endurance,Impaired flexibility,Decreased scar mobility  Visit Diagnosis: Status post total right knee replacement  Muscle weakness (generalized)  Chronic pain of left knee  Stiffness of right knee, not elsewhere classified  Stiffness of left knee, not elsewhere classified  Chronic pain of right knee     Problem List Patient Active Problem List   Diagnosis Date Noted  . Skin infection 05/28/2020  . Wound of skin 05/28/2020  . S/P knee surgery 05/28/2020  . Eczematous dermatitis of upper eyelids of both eyes 05/28/2020  . Osteoarthritis of left knee 05/27/2020  . S/P total knee replacement, right 04/15/2020  . Osteopenia 04/10/2020  . Major depressive disorder, recurrent episode, mild (HCC) 10/31/2017  . ADHD, predominantly inattentive type 10/31/2017  . Primary osteoarthritis of right knee 09/09/2016  . Primary osteoarthritis, right shoulder 09/09/2016  . Arthritis 07/25/2012  . Tinnitus 07/25/2012  . Mixed incontinence urge and stress 07/25/2012    Dessie Coma PT DPT  06/04/2020, 12:32 PM  [facility] [address] [place], [place], [address] Phone: [phone]   Fax:  [phone]  Name: Megan Nunez MRN: [id number] Date of Birth: Oct 31, 1945

## 2020-06-06 ENCOUNTER — Other Ambulatory Visit: Payer: Self-pay

## 2020-06-06 ENCOUNTER — Ambulatory Visit (INDEPENDENT_AMBULATORY_CARE_PROVIDER_SITE_OTHER): Payer: Medicare Other | Admitting: Family Medicine

## 2020-06-06 ENCOUNTER — Encounter (HOSPITAL_BASED_OUTPATIENT_CLINIC_OR_DEPARTMENT_OTHER): Payer: Medicare Other | Admitting: Physical Therapy

## 2020-06-06 ENCOUNTER — Encounter: Payer: Self-pay | Admitting: Family Medicine

## 2020-06-06 VITALS — BP 138/76 | HR 85 | Temp 96.5°F | Ht 59.5 in | Wt 159.0 lb

## 2020-06-06 DIAGNOSIS — L989 Disorder of the skin and subcutaneous tissue, unspecified: Secondary | ICD-10-CM | POA: Diagnosis not present

## 2020-06-06 NOTE — Progress Notes (Signed)
   Subjective:    Patient ID: Megan Nunez, female    DOB: 11-01-1945, 75 y.o.   MRN: [id number]  HPI She notes a lesion on her left calf area that started bleeding on its own several weeks ago.  She is concerned over possible infection.   Review of Systems     Objective:   Physical Exam Exam of the left lateral calf does show a 1 and half centimeter dry slightly erythematous lesion that appears to be in the healing process.       Assessment & Plan:  Leg skin lesion, left I reassured her that I thought the lesion was healing and no further intervention was needed.

## 2020-06-10 ENCOUNTER — Encounter (HOSPITAL_BASED_OUTPATIENT_CLINIC_OR_DEPARTMENT_OTHER): Payer: Self-pay | Admitting: Physical Therapy

## 2020-06-10 ENCOUNTER — Other Ambulatory Visit: Payer: Self-pay

## 2020-06-10 ENCOUNTER — Ambulatory Visit (HOSPITAL_BASED_OUTPATIENT_CLINIC_OR_DEPARTMENT_OTHER): Payer: Medicare Other | Admitting: Physical Therapy

## 2020-06-10 ENCOUNTER — Encounter (HOSPITAL_BASED_OUTPATIENT_CLINIC_OR_DEPARTMENT_OTHER): Payer: Medicare Other | Admitting: Physical Therapy

## 2020-06-10 DIAGNOSIS — M25661 Stiffness of right knee, not elsewhere classified: Secondary | ICD-10-CM | POA: Diagnosis not present

## 2020-06-10 DIAGNOSIS — M25561 Pain in right knee: Secondary | ICD-10-CM | POA: Diagnosis not present

## 2020-06-10 DIAGNOSIS — M6281 Muscle weakness (generalized): Secondary | ICD-10-CM

## 2020-06-10 DIAGNOSIS — M25662 Stiffness of left knee, not elsewhere classified: Secondary | ICD-10-CM | POA: Diagnosis not present

## 2020-06-10 DIAGNOSIS — G8929 Other chronic pain: Secondary | ICD-10-CM

## 2020-06-10 DIAGNOSIS — Z96651 Presence of right artificial knee joint: Secondary | ICD-10-CM

## 2020-06-10 DIAGNOSIS — M25562 Pain in left knee: Secondary | ICD-10-CM | POA: Diagnosis not present

## 2020-06-10 NOTE — Therapy (Signed)
[facility] [address] [place], [place], [address] Phone: [phone]   Fax:  [phone]  Physical Therapy Treatment/Discharge   Patient Details  Name: Megan Nunez MRN: [id number] Date of Birth: 1945-12-17 Referring Provider (PT): Teryl Lucy, MD   Encounter Date: 06/10/2020   PT End of Session - 06/10/20 1154    Visit Number 11    Number of Visits 17    Date for PT Re-Evaluation 06/24/20    Authorization Type UHC Medicare progress note in 2 visits    PT Start Time 1104    PT Stop Time 1145    PT Time Calculation (min) 41 min    Activity Tolerance Patient tolerated treatment well    Behavior During Therapy [facility] for tasks assessed/performed           Past Medical History:  Diagnosis Date  . Anxiety   . Arthritis   . Depression   . Family history of adverse reaction to anesthesia 2012   Mother has cardiac  arrest after surgery  . Hepatitis    high antibodies for hepatitis  . Obesity     Past Surgical History:  Procedure Laterality Date  . LAPAROSCOPIC TUBAL LIGATION  1985  . TOTAL KNEE ARTHROPLASTY Right 04/15/2020   Procedure: TOTAL KNEE ARTHROPLASTY;  Surgeon: Teryl Lucy, MD;  Location: [facility];  Service: Orthopedics;  Laterality: Right;  . WISDOM TOOTH EXTRACTION     age 75    There were no vitals filed for this visit.   Subjective Assessment - 06/10/20 1129    Subjective Patient's left knee is doing very well. Her right knee is still painful. She feels comofrtable with her exercsises at home and is ready for D/C    Limitations Walking    How long can you sit comfortably? n/a    How long can you stand comfortably? This feels the worse. ~5 min before needing to sit.    Patient Stated Goals Improve R LE knee strength, walking and standing endurance    Currently in Pain? No/denies              [facility] PT Assessment - 06/10/20 0001      AROM   Right Knee Extension 0    Right Knee Flexion 120       PROM   Right Knee Extension 0    Right Knee Flexion 120      Strength   Right Hip Flexion 5/5    Right Hip Extension 5/5    Right Hip ABduction 5/5    Right Knee Flexion 5/5    Right Knee Extension 5/5                         [facility] Adult PT Treatment/Exercise - 06/10/20 0001      Self-Care   Self-Care Other Self-Care Comments    Other Self-Care Comments  reivewed final HEP and prehab concepts for her opposite knees.      Knee/Hip Exercises: Aerobic   Nustep 5 min L3      Knee/Hip Exercises: Machines for Strengthening   Other Machine reviewed which emachines to use for her legs      Knee/Hip Exercises: Standing   Heel Raises Left;Right;2 sets;10 reps    Lateral Step Up Limitations x15 6 inch    Forward Step Up Limitations x15 6 inch    Step Down Limitations 2x15 4 inch    Other Standing Knee Exercises  slow march 2x15      Knee/Hip Exercises: Seated   Long Arc Quad Limitations 2x15   RED   Hamstring Limitations red 2x10      Knee/Hip Exercises: Supine   Quad Sets Limitations x15 bilateral reviewed for prehab for the left    Straight Leg Raises Limitations 2x15 2lbs                  PT Education - 06/10/20 1154    Education Details reviewed HEP and symptom management    Person(s) Educated Patient    Methods Explanation;Tactile cues;Demonstration;Verbal cues    Comprehension Returned demonstration;Verbalized understanding;Verbal cues required;Tactile cues required            PT Short Term Goals - 04/29/20 1538      PT SHORT TERM GOAL #1   Title Pt will be independent with HEP    Time 4    Period Weeks    Status New    Target Date 05/27/20      PT SHORT TERM GOAL #2   Title Pt will be able to walk with normal reciprocal gait pattern    Baseline Mildly antalgic    Time 4    Period Days    Status New    Target Date 05/27/20      PT SHORT TERM GOAL #3   Title Pt will be able to demo R knee AROM of 0 to >105 deg    Baseline 2  to 96 deg    Time 4    Period Weeks    Status New    Target Date 05/27/20      PT SHORT TERM GOAL #4   Title --    Baseline --    Time --    Period --    Status --    Target Date --             PT Long Term Goals - 04/29/20 1540      PT LONG TERM GOAL #1   Title Pt will be independent with final HEP    Time 8    Period Weeks    Status New    Target Date 06/24/20      PT LONG TERM GOAL #2   Title Pt will be able to perform 5x STS in <13 sec to demo increased functional strength and balance    Time 8    Period Weeks    Status New    Target Date 06/24/20      PT LONG TERM GOAL #3   Title Pt will be able to perform steps at home with normal reciprocal pattern and no hand rail    Baseline Needs hand rail due to steep steps at home    Time 8    Period Weeks    Status New    Target Date 06/24/20      PT LONG TERM GOAL #4   Title Pt will be able to tolerate standing at least 15 minutes for improved home and community mobility    Baseline Prior to TKA, only able to tolerate ~5 min    Time 8    Period Weeks    Status New    Target Date 06/24/20                 Plan - 06/10/20 1358    Clinical Impression Statement Patient has progressed very well. She is going up and down steps without difficulty. She  is squatting within the limits of her opposite knee. She has full range of motion. Therapy reviewed 4 pre-hab exercises for her opposite knee. She will be having her opposite knee done as soon as she is able. She has a full program for home. See below for goal specific progress.    Personal Factors and Comorbidities Age;Fitness;Past/Current Experience;Time since onset of injury/illness/exacerbation    Examination-Activity Limitations Transfers;Locomotion Level;Stairs;Stand;Hygiene/Grooming    Examination-Participation Restrictions Community Activity;Driving;Cleaning    Stability/Clinical Decision Making Stable/Uncomplicated    Clinical Decision Making Low    Rehab  Potential Good    PT Frequency 2x / week    PT Duration 8 weeks    PT Treatment/Interventions ADLs/Self Care Home Management;Therapeutic exercise;Aquatic Therapy;Cryotherapy;Iontophoresis 4mg /ml Dexamethasone;Moist Heat;Ultrasound;DME Instruction;Electrical Stimulation;Gait training;Stair training;Functional mobility training;Therapeutic activities;Balance training;Neuromuscular re-education;Manual techniques;Patient/family education;Orthotic Fit/Training;Scar mobilization;Dry needling;Energy conservation;Taping;Vasopneumatic Device    PT Next Visit Plan Assess response to updated HEP. Continue hip, quad, and core strengthening (progress into standing!).    PT Home Exercise Plan Access Code    Consulted and Agree with Plan of Care Patient           Patient will benefit from skilled therapeutic intervention in order to improve the following deficits and impairments:  Abnormal gait,Increased fascial restricitons,Pain,Decreased mobility,Decreased activity tolerance,Decreased range of motion,Decreased strength,Hypomobility,Decreased balance,Difficulty walking,Increased edema,Decreased endurance,Impaired flexibility,Decreased scar mobility  Visit Diagnosis: Status post total right knee replacement  Muscle weakness (generalized)  Chronic pain of left knee  Stiffness of right knee, not elsewhere classified  Stiffness of left knee, not elsewhere classified  Chronic pain of right knee     Problem List Patient Active Problem List   Diagnosis Date Noted  . Skin infection 05/28/2020  . Wound of skin 05/28/2020  . S/P knee surgery 05/28/2020  . Eczematous dermatitis of upper eyelids of both eyes 05/28/2020  . Osteoarthritis of left knee 05/27/2020  . S/P total knee replacement, right 04/15/2020  . Osteopenia 04/10/2020  . Major depressive disorder, recurrent episode, mild (HCC) 10/31/2017  . ADHD, predominantly inattentive type 10/31/2017  . Primary osteoarthritis of right knee  09/09/2016  . Primary osteoarthritis, right shoulder 09/09/2016  . Arthritis 07/25/2012  . Tinnitus 07/25/2012  . Mixed incontinence urge and stress 07/25/2012    09/25/2012 PT DPT  06/10/2020, 2:28 PM  [facility] [address] [place], [place], [place] Phone: [phone]   Fax:  [phone]  Name: ADDALIE CALLES MRN: [name] Date of Birth: [birth year]

## 2020-06-13 ENCOUNTER — Ambulatory Visit (HOSPITAL_BASED_OUTPATIENT_CLINIC_OR_DEPARTMENT_OTHER): Payer: Medicare Other | Admitting: Physical Therapy

## 2020-07-02 ENCOUNTER — Encounter (HOSPITAL_COMMUNITY): Admission: RE | Admit: 2020-07-02 | Payer: Medicare Other | Source: Ambulatory Visit

## 2020-07-08 DIAGNOSIS — M1712 Unilateral primary osteoarthritis, left knee: Secondary | ICD-10-CM | POA: Diagnosis present

## 2020-07-14 DIAGNOSIS — M1712 Unilateral primary osteoarthritis, left knee: Secondary | ICD-10-CM | POA: Diagnosis not present

## 2020-07-16 ENCOUNTER — Telehealth: Payer: Self-pay | Admitting: Psychiatry

## 2020-07-16 NOTE — Telephone Encounter (Signed)
Patient lm requesting a refill on the Vyvanse. Will need before her scheduled appointment. Fill at the [facility] [employer].

## 2020-07-16 NOTE — Progress Notes (Signed)
DUE TO COVID-19 ONLY ONE VISITOR IS ALLOWED TO COME WITH YOU AND STAY IN THE WAITING ROOM ONLY DURING PRE OP AND PROCEDURE DAY OF SURGERY. THE 1 VISITOR  MAY VISIT WITH YOU AFTER SURGERY IN YOUR PRIVATE ROOM DURING VISITING HOURS ONLY!  YOU NEED TO HAVE A COVID 19 TEST ON_____7/08/2020 __ @_______ , THIS TEST MUST BE DONE BEFORE SURGERY,  COVID TESTING SITE [address] [place] , IT IS ON THE RIGHT GOING OUT [address] APPROXIMATELY  2 MINUTES PAST [employer] ON THE RIGHT. ONCE YOUR COVID TEST IS COMPLETED,  PLEASE BEGIN THE QUARANTINE INSTRUCTIONS AS OUTLINED IN YOUR HANDOUT.                Megan Nunez  07/16/2020   Your procedure is scheduled on:  07/29/2020   Report to [facility] Main  Entrance   Report to admitting at    0515am   AM     Call this number if you have problems the morning of surgery [phone]    REMEMBER: NO  SOLID FOOD CANDY OR GUM AFTER MIDNIGHT. CLEAR LIQUIDS UNTIL    0430am      . NOTHING BY MOUTH EXCEPT CLEAR LIQUIDS UNTIL   0430am    . PLEASE FINISH ENSURE DRINK PER SURGEON ORDER  WHICH NEEDS TO BE COMPLETED AT 0430am      .      CLEAR LIQUID DIET   Foods Allowed                                                                    Coffee and tea, regular and decaf                            Fruit ices (not with fruit pulp)                                      Iced Popsicles                                    Carbonated beverages, regular and diet                                    Cranberry, grape and apple juices Sports drinks like Gatorade Lightly seasoned clear broth or consume(fat free) Sugar, honey syrup ___________________________________________________________________      BRUSH YOUR TEETH MORNING OF SURGERY AND RINSE YOUR MOUTH OUT, NO CHEWING GUM CANDY OR MINTS.     Take these medicines the morning of surgery wit h A SIP OF WATER:  cymbalta, wellbutrin   DO NOT TAKE ANY DIABETIC MEDICATIONS DAY  OF YOUR SURGERY                               You may not have any metal on your body including hair pins and  piercings  Do not wear jewelry, make-up, lotions, powders or perfumes, deodorant             Do not wear nail polish on your fingernails.  Do not shave  48 hours prior to surgery.              Men may shave face and neck.   Do not bring valuables to the hospital. [place].  Contacts, dentures or bridgework may not be worn into surgery.  Leave suitcase in the car. After surgery it may be brought to your room.     Patients discharged the day of surgery will not be allowed to drive home. IF YOU ARE HAVING SURGERY AND GOING HOME THE SAME DAY, YOU MUST HAVE AN ADULT TO DRIVE YOU HOME AND BE WITH YOU FOR 24 HOURS. YOU MAY GO HOME BY TAXI OR UBER OR ORTHERWISE, BUT AN ADULT MUST ACCOMPANY YOU HOME AND STAY WITH YOU FOR 24 HOURS.  Name and phone number of your driver:  Special Instructions: N/A              Please read over the following fact sheets you were given: _____________________________________________________________________  [facility] - Preparing for Surgery Before surgery, you can play an important role.  Because skin is not sterile, your skin needs to be as free of germs as possible.  You can reduce the number of germs on your skin by washing with CHG (chlorahexidine gluconate) soap before surgery.  CHG is an antiseptic cleaner which kills germs and bonds with the skin to continue killing germs even after washing. Please DO NOT use if you have an allergy to CHG or antibacterial soaps.  If your skin becomes reddened/irritated stop using the CHG and inform your nurse when you arrive at Short Stay. Do not shave (including legs and underarms) for at least 48 hours prior to the first CHG shower.  You may shave your face/neck. Please follow these instructions carefully:  1.  Shower with CHG Soap the night before surgery  and the  morning of Surgery.  2.  If you choose to wash your hair, wash your hair first as usual with your  normal  shampoo.  3.  After you shampoo, rinse your hair and body thoroughly to remove the  shampoo.                           4.  Use CHG as you would any other liquid soap.  You can apply chg directly  to the skin and wash                       Gently with a scrungie or clean washcloth.  5.  Apply the CHG Soap to your body ONLY FROM THE NECK DOWN.   Do not use on face/ open                           Wound or open sores. Avoid contact with eyes, ears mouth and genitals (private parts).                       Wash face,  Genitals (private parts) with your normal soap.             6.  Wash thoroughly, paying special attention to the area where your surgery  will be performed.  7.  Thoroughly rinse your body with warm water from the neck down.  8.  DO NOT shower/wash with your normal soap after using and rinsing off  the CHG Soap.                9.  Pat yourself dry with a clean towel.            10.  Wear clean pajamas.            11.  Place clean sheets on your bed the night of your first shower and do not  sleep with pets. Day of Surgery : Do not apply any lotions/deodorants the morning of surgery.  Please wear clean clothes to the hospital/surgery center.  FAILURE TO FOLLOW THESE INSTRUCTIONS MAY RESULT IN THE CANCELLATION OF YOUR SURGERY PATIENT SIGNATURE_________________________________  NURSE SIGNATURE__________________________________  ________________________________________________________________________

## 2020-07-17 ENCOUNTER — Other Ambulatory Visit: Payer: Self-pay

## 2020-07-17 DIAGNOSIS — F900 Attention-deficit hyperactivity disorder, predominantly inattentive type: Secondary | ICD-10-CM

## 2020-07-17 MED ORDER — LISDEXAMFETAMINE DIMESYLATE 40 MG PO CAPS: 40.0000 mg | ORAL_CAPSULE | ORAL | 0 refills | Status: DC

## 2020-07-17 NOTE — Telephone Encounter (Signed)
Pended.

## 2020-07-18 ENCOUNTER — Other Ambulatory Visit: Payer: Self-pay

## 2020-07-18 ENCOUNTER — Encounter (HOSPITAL_COMMUNITY)
Admission: RE | Admit: 2020-07-18 | Discharge: 2020-07-18 | Disposition: A | Discharge status: Home / Self Care | Payer: Medicare Other | Source: Ambulatory Visit | Attending: Orthopedic Surgery | Admitting: Orthopedic Surgery

## 2020-07-18 ENCOUNTER — Encounter (HOSPITAL_COMMUNITY): Payer: Self-pay

## 2020-07-18 DIAGNOSIS — Z01812 Encounter for preprocedural laboratory examination: Secondary | ICD-10-CM | POA: Insufficient documentation

## 2020-07-18 LAB — CBC
HCT: 46.3 % — ABNORMAL HIGH (ref 36.0–46.0)
Hemoglobin: 14.7 g/dL (ref 12.0–15.0)
MCH: 28.8 pg (ref 26.0–34.0)
MCHC: 31.7 g/dL (ref 30.0–36.0)
MCV: 90.6 fL (ref 80.0–100.0)
Platelets: 224 10*3/uL (ref 150–400)
RBC: 5.11 MIL/uL (ref 3.87–5.11)
RDW: 13.2 % (ref 11.5–15.5)
WBC: 7.1 10*3/uL (ref 4.0–10.5)
nRBC: 0 % (ref 0.0–0.2)

## 2020-07-18 LAB — COMPREHENSIVE METABOLIC PANEL
ALT: 15 U/L (ref 0–44)
AST: 18 U/L (ref 15–41)
Albumin: 4.5 g/dL (ref 3.5–5.0)
Alkaline Phosphatase: 84 U/L (ref 38–126)
Anion gap: 6 (ref 5–15)
BUN: 13 mg/dL (ref 8–23)
CO2: 26 mmol/L (ref 22–32)
Calcium: 9.2 mg/dL (ref 8.9–10.3)
Chloride: 106 mmol/L (ref 98–111)
Creatinine, Ser: 0.64 mg/dL (ref 0.44–1.00)
GFR, Estimated: >60 mL/min (ref >60)
Glucose, Bld: 105 mg/dL — ABNORMAL HIGH (ref 70–99)
Potassium: 4.6 mmol/L (ref 3.5–5.1)
Sodium: 138 mmol/L (ref 135–145)
Total Bilirubin: 0.8 mg/dL (ref 0.3–1.2)
Total Protein: 7.5 g/dL (ref 6.5–8.1)

## 2020-07-18 NOTE — Care Plan (Signed)
Ortho Bundle Case Management Note  Patient Details  Name: Megan Nunez MRN: [id number] Date of Birth: 09/24/1945  Met with patient in the office for H&P visit. She will discharge to home with her family to assist. Has all equipment from prior surgery. Requested a CPM and this has been ordered from [employer]. HHPT referral to [facility] and OPPT set up with [facility]. Patient and MD in agreement with plan. Choice offered                     DME Arranged:    DME Agency:     HH Arranged:  PT HH Agency:  [facility]  Additional Comments: Please contact me with any questions of if this plan should need to change.  Renee Angiulli,  RN,BSN,MHA,CCM  [facility]  [phone] 07/18/2020, 8:06 AM   

## 2020-07-18 NOTE — Progress Notes (Signed)
Anesthesia Review:  PCP: DR lalonde LOV 06/06/2020  Cardiologist : Chest x-ray : EKG : 03/18/20  Echo : Stress test: Cardiac Cath :  Activity level: can do a flight of stairs without difficulty  Sleep Study/ CPAP : none  Fasting Blood Sugar :      / Checks Blood Sugar -- times a day:   Blood Thinner/ Instructions /Last Dose: ASA / Instructions/ Last Dose :   325 mg Aspirin Had right knee done 03/2020.   Nickel Allergy. - placed in special needs column

## 2020-07-19 LAB — SURGICAL PCR SCREEN
MRSA, PCR: NEGATIVE
Staphylococcus aureus: POSITIVE — AB

## 2020-07-25 ENCOUNTER — Other Ambulatory Visit: Payer: Self-pay

## 2020-07-25 ENCOUNTER — Other Ambulatory Visit (HOSPITAL_COMMUNITY)
Admission: RE | Admit: 2020-07-25 | Discharge: 2020-07-25 | Disposition: A | Discharge status: Home / Self Care | Payer: Medicare Other | Source: Ambulatory Visit | Attending: Orthopedic Surgery | Admitting: Orthopedic Surgery

## 2020-07-25 ENCOUNTER — Encounter (HOSPITAL_BASED_OUTPATIENT_CLINIC_OR_DEPARTMENT_OTHER): Payer: Self-pay | Admitting: Physical Therapy

## 2020-07-25 ENCOUNTER — Ambulatory Visit (HOSPITAL_BASED_OUTPATIENT_CLINIC_OR_DEPARTMENT_OTHER): Payer: Medicare Other | Attending: Orthopedic Surgery | Admitting: Physical Therapy

## 2020-07-25 DIAGNOSIS — R2689 Other abnormalities of gait and mobility: Secondary | ICD-10-CM | POA: Diagnosis not present

## 2020-07-25 DIAGNOSIS — G8929 Other chronic pain: Secondary | ICD-10-CM | POA: Diagnosis not present

## 2020-07-25 DIAGNOSIS — M25562 Pain in left knee: Secondary | ICD-10-CM | POA: Diagnosis not present

## 2020-07-25 DIAGNOSIS — Z01812 Encounter for preprocedural laboratory examination: Secondary | ICD-10-CM | POA: Diagnosis not present

## 2020-07-25 DIAGNOSIS — Z20822 Contact with and (suspected) exposure to covid-19: Secondary | ICD-10-CM | POA: Insufficient documentation

## 2020-07-25 DIAGNOSIS — M25662 Stiffness of left knee, not elsewhere classified: Secondary | ICD-10-CM | POA: Insufficient documentation

## 2020-07-25 LAB — SARS CORONAVIRUS 2 (TAT 6-24 HRS): SARS Coronavirus 2: NEGATIVE

## 2020-07-27 ENCOUNTER — Encounter (HOSPITAL_BASED_OUTPATIENT_CLINIC_OR_DEPARTMENT_OTHER): Payer: Self-pay | Admitting: Physical Therapy

## 2020-07-27 NOTE — Therapy (Signed)
[facility] [address] [place], [place], [address] Phone: [phone]   Fax:  [phone]  Physical Therapy Evaluation  Patient Details  Name: Megan Nunez MRN: [id number] Date of Birth: February 09, 1945 Referring Provider (PT): Teryl Lucy, MD   Encounter Date: 07/25/2020   PT End of Session - 07/27/20 0749     Visit Number 1    Number of Visits 1    Date for PT Re-Evaluation 07/26/20    Authorization Type [employer] Medicare progress note in 2 visits    PT Start Time 1300    PT Stop Time 1343    PT Time Calculation (min) 43 min    Activity Tolerance Patient tolerated treatment well    Behavior During Therapy [facility] for tasks assessed/performed             Past Medical History:  Diagnosis Date   Anxiety    Arthritis    Depression    Family history of adverse reaction to anesthesia 2012   Mother has cardiac  arrest after surgery   Hepatitis    high antibodies for hepatitis   Obesity     Past Surgical History:  Procedure Laterality Date   LAPAROSCOPIC TUBAL LIGATION  1985   TOTAL KNEE ARTHROPLASTY Right 04/15/2020   Procedure: TOTAL KNEE ARTHROPLASTY;  Surgeon: Teryl Lucy, MD;  Location: [facility];  Service: Orthopedics;  Laterality: Right;   WISDOM TOOTH EXTRACTION     age 89    There were no vitals filed for this visit.    Subjective Assessment - 07/27/20 0801     Subjective Patient comes in today for a pre-hab visit prir to her surgey. She reports it was very helpful prior to her last suregry. She has significant limitations in knee extension at this time. She has having progressive difficulty walking on the left knee. She has several questions aout steps and progression after her surgery.    Limitations Walking    How long can you sit comfortably? n/a    How long can you stand comfortably? This feels the worse. ~5 min before needing to sit.    How long can you walk comfortably? limited istance with main     Patient Stated Goals Improve R LE knee strength, walking and standing endurance    Currently in Pain? Yes    Pain Score 5     Pain Location Knee    Pain Orientation Left    Pain Descriptors / Indicators Aching    Pain Type Chronic pain    Pain Onset More than a month ago    Pain Frequency Constant    Aggravating Factors  standing and walking    Pain Relieving Factors rest    Effect of Pain on Daily Activities difficulty standing and walking                [facility] PT Assessment - 07/27/20 0001       Assessment   Medical Diagnosis left knee pain    Referring Provider (PT) Teryl Lucy, MD    Onset Date/Surgical Date 04/15/20      Precautions   Precautions None      Restrictions   Weight Bearing Restrictions No      Balance Screen   Has the patient fallen in the past 6 months No      Home Environment   Living Environment Private residence    Living Arrangements Spouse/significant other    Available Help at Discharge Family  Type of Home House    Home Access Level entry    Home Layout Multi-level    Alternate Level Stairs-Number of Steps 2    Alternate Level Stairs-Rails None    Home Equipment Walker - 2 wheels    Additional Comments has steps into her den      Prior Function   Level of Independence Independent      AROM   Left Knee Extension -14    Left Knee Flexion 110                        Objective measurements completed on examination: See above findings.               PT Education - 07/27/20 0748     Education Details reviewed HEP; symptom mangement; safety prior to suregery; things to expect and be careful of after surgery    Person(s) Educated Patient    Methods Explanation;Demonstration;Verbal cues;Tactile cues    Comprehension Verbalized understanding;Returned demonstration;Verbal cues required;Tactile cues required              PT Short Term Goals - 07/27/20 0756       PT SHORT TERM GOAL #1   Title  Patient will feel comfortable and confident fgoing into her total knee replacement    Time 1    Period Days    Status Achieved               PT Long Term Goals - 06/10/20 1429       PT LONG TERM GOAL #1   Title Pt will be independent with final HEP    Time 8    Period Weeks    Status Achieved      PT LONG TERM GOAL #2   Title Pt will be able to perform 5x STS in <13 sec to demo increased functional strength and balance    Time 8    Period Weeks    Status Achieved      PT LONG TERM GOAL #3   Title Pt will be able to perform steps at home with normal reciprocal pattern and no hand rail    Baseline Needs hand rail due to steep steps at home    Time 8    Period Weeks    Status Achieved      PT LONG TERM GOAL #4   Title Pt will be able to tolerate standing at least 15 minutes for improved home and community mobility    Baseline standing as right left knee will allow    Time 8    Period Weeks    Status Achieved                    Plan - 07/27/20 0750     Clinical Impression Statement Therapy reviewed possile techniques for getting up adn down her steps. She has grab bars and she will have assistance. Therapy advised her the best thing to do is proactive with her home health therapist. She has increased verus compared to pher previosu therapy and increased pain while walking. Therapy reviewed what to expect following surgery. She is well prepared for surgery. She has all the equipment she needs at this time.    Personal Factors and Comorbidities Age;Fitness;Past/Current Experience;Time since onset of injury/illness/exacerbation    Examination-Activity Limitations Transfers;Locomotion Level;Stairs;Stand;Hygiene/Grooming    Examination-Participation Restrictions Community Activity;Driving;Cleaning    Stability/Clinical Decision Making Stable/Uncomplicated    Clinical  Decision Making Low    Rehab Potential Good    PT Frequency 2x / week    PT Duration 8 weeks     PT Treatment/Interventions ADLs/Self Care Home Management;Therapeutic exercise;Aquatic Therapy;Cryotherapy;Iontophoresis 4mg /ml Dexamethasone;Moist Heat;Ultrasound;DME Instruction;Electrical Stimulation;Gait training;Stair training;Functional mobility training;Therapeutic activities;Balance training;Neuromuscular re-education;Manual techniques;Patient/family education;Orthotic Fit/Training;Scar mobilization;Dry needling;Energy conservation;Taping;Vasopneumatic Device    PT Next Visit Plan re-assess when she returns following surgery    PT Home Exercise Plan --    Consulted and Agree with Plan of Care Patient    Family Member Consulted Husband             Patient will benefit from skilled therapeutic intervention in order to improve the following deficits and impairments:  Abnormal gait, Increased fascial restricitons, Pain, Decreased mobility, Decreased activity tolerance, Decreased range of motion, Decreased strength, Hypomobility, Decreased balance, Difficulty walking, Increased edema, Decreased endurance, Impaired flexibility, Decreased scar mobility  Visit Diagnosis: Chronic pain of left knee  Stiffness of left knee, not elsewhere classified  Other abnormalities of gait and mobility     Problem List Patient Active Problem List   Diagnosis Date Noted   Skin infection 05/28/2020   Wound of skin 05/28/2020   S/P knee surgery 05/28/2020   Eczematous dermatitis of upper eyelids of both eyes 05/28/2020   Osteoarthritis of left knee 05/27/2020   S/P total knee replacement, right 04/15/2020   Osteopenia 04/10/2020   Major depressive disorder, recurrent episode, mild (HCC) 10/31/2017   ADHD, predominantly inattentive type 10/31/2017   Primary osteoarthritis of right knee 09/09/2016   Primary osteoarthritis, right shoulder 09/09/2016   Arthritis 07/25/2012   Tinnitus 07/25/2012   Mixed incontinence urge and stress 07/25/2012    09/25/2012 07/27/2020, 8:05 AM  [facility] [address] [place], [place], [place] Phone: [phone]   Fax:  [phone]  Name: Megan Nunez MRN: [name] Date of Birth: 16-Feb-1945

## 2020-07-28 ENCOUNTER — Encounter: Payer: Self-pay | Admitting: Psychiatry

## 2020-07-28 ENCOUNTER — Telehealth (INDEPENDENT_AMBULATORY_CARE_PROVIDER_SITE_OTHER): Payer: Medicare Other | Admitting: Psychiatry

## 2020-07-28 DIAGNOSIS — F900 Attention-deficit hyperactivity disorder, predominantly inattentive type: Secondary | ICD-10-CM | POA: Diagnosis not present

## 2020-07-28 DIAGNOSIS — F339 Major depressive disorder, recurrent, unspecified: Secondary | ICD-10-CM | POA: Diagnosis not present

## 2020-07-28 MED ORDER — LISDEXAMFETAMINE DIMESYLATE 40 MG PO CAPS: 40.0000 mg | ORAL_CAPSULE | ORAL | 0 refills | Status: DC

## 2020-07-28 MED ORDER — BUPROPION HCL ER (XL) 300 MG PO TB24: 300.0000 mg | ORAL_TABLET | Freq: Every day | ORAL | 1 refills | Status: DC

## 2020-07-28 MED ORDER — DULOXETINE HCL 60 MG PO CPEP: 60.0000 mg | ORAL_CAPSULE | Freq: Every morning | ORAL | 1 refills | Status: DC

## 2020-07-28 MED ORDER — DULOXETINE HCL 60 MG PO CPEP
60.0000 mg | ORAL_CAPSULE | Freq: Every morning | ORAL | 1 refills | Status: DC
Start: 2020-07-28 — End: 2020-07-28

## 2020-07-28 MED ORDER — DULOXETINE HCL 30 MG PO CPEP: ORAL_CAPSULE | ORAL | 0 refills | Status: DC

## 2020-07-28 NOTE — Progress Notes (Signed)
Megan Nunez [id number] 06/25/1945 75 y.o.  Virtual Visit via Video Note  I connected with pt @ on 07/28/20 at 11:30 AM EDT by a video enabled telemedicine application and verified that I am speaking with the correct person using two identifiers.   I discussed the limitations of evaluation and management by telemedicine and the availability of in person appointments. The patient expressed understanding and agreed to proceed.  I discussed the assessment and treatment plan with the patient. The patient was provided an opportunity to ask questions and all were answered. The patient agreed with the plan and demonstrated an understanding of the instructions.   The patient was advised to call back or seek an in-person evaluation if the symptoms worsen or if the condition fails to improve as anticipated.  I provided 30 minutes of non-face-to-face time during this encounter.  The patient was located at home.  The provider was located at [facility].   Corie Chiquito, PMHNP   Subjective:   Patient ID:  Megan Nunez is a 75 y.o. (DOB 1945-03-16) female.  Chief Complaint:  Chief Complaint  Patient presents with   Follow-up    ADHD, anxiety, and depression    HPI Megan Nunez presents for follow-up of depression, anxiety, and ADHD. She is scheduled to have her second knee replacement tomorrow. She reports that her first knee replacement went very well. She reports that she did not have any anxiety with first knee replacement. Denies any significant depression.  She reports that energy and motivation have been good and she was able to make some significant progress in PT after knee surgery. She reports that she has been staying up later about half the time (ie, 2 am) and sleeping later.  She reports that husband is a night owl and she stays up later with him at times. She reports that current sleep schedule is "not a problem." Appetite has been good. She reports that she cannot buy sweets  without immediately eating them. Concentration has been "pretty good." Reports that Vyvanse is helpful for concentration. She reports that she has not been interrupting husband and this has been helpful for their relationship.   She reports that she has difficulty recalling certain names of nouns.   She reports that she and her husband have been doing very well. Reports that husband has been taking good care of her. Husband just turned 70. They just celebrated their 30th wedding anniversary.   Past Psychiatric Medication Trials: Sertraline Prozac Cymbalta Wellbutrin XL Adderall Adderall XR  Review of Systems:  Review of Systems  Genitourinary:        Urinary incontience  Musculoskeletal:  Negative for gait problem.       Shoulder pain. Knee pain  Psychiatric/Behavioral:         Please refer to HPI   Medications: I have reviewed the patient's current medications.  Current Outpatient Medications  Medication Sig Dispense Refill   ibuprofen (ADVIL) 200 MG tablet Take 200-400 mg by mouth every 6 (six) hours as needed for moderate pain.     [START ON 09/11/2020] lisdexamfetamine (VYVANSE) 40 MG capsule Take 1 capsule (40 mg total) by mouth every morning. 30 capsule 0   [START ON 10/09/2020] lisdexamfetamine (VYVANSE) 40 MG capsule Take 1 capsule (40 mg total) by mouth every morning. 30 capsule 0   Ascorbic Acid (VITAMIN C) 1000 MG tablet Take 1,000 mg by mouth daily. (Patient not taking: Reported on 07/16/2020)     aspirin EC 325  MG tablet Take 1 tablet (325 mg total) by mouth 2 (two) times daily. (Patient not taking: No sig reported) 60 tablet 0   B Complex-C (B-COMPLEX WITH VITAMIN C) tablet Take 1 tablet by mouth daily. (Patient not taking: Reported on 07/16/2020)     Biotin 1610910000 MCG TABS Take 10,000 mcg by mouth daily. (Patient not taking: Reported on 07/16/2020)     buPROPion (WELLBUTRIN XL) 300 MG 24 hr tablet Take 1 tablet (300 mg total) by mouth daily. 90 tablet 1   cephALEXin  (KEFLEX) 500 MG capsule Take 1 capsule (500 mg total) by mouth 3 (three) times daily. (Patient not taking: Reported on 07/16/2020) 30 capsule 0   Cholecalciferol (VITAMIN D) 50 MCG (2000 UT) tablet Take 2,000 Units by mouth daily.     DULoxetine (CYMBALTA) 30 MG capsule TAKE ONE CAPSULE BY MOUTH DAILY TAKE WITH 60 MG 90 capsule 0   DULoxetine (CYMBALTA) 60 MG capsule Take 1 capsule (60 mg total) by mouth every morning. Take with a 30 mg capsule to equal total dose of 90 mg 90 capsule 1   Ginkgo Biloba 120 MG TABS Take 120 mg by mouth daily. (Patient not taking: Reported on 07/16/2020)     Glucosamine-Chondroitin 750-600 MG CHEW Chew 2 tablets by mouth daily. (Patient not taking: Reported on 07/16/2020)     [START ON 08/14/2020] lisdexamfetamine (VYVANSE) 40 MG capsule Take 1 capsule (40 mg total) by mouth every morning. 30 capsule 0   Multiple Vitamins-Minerals (MULTIVITAMIN WITH MINERALS) tablet Take 1 tablet by mouth daily. (Patient not taking: Reported on 07/16/2020)     ondansetron (ZOFRAN) 4 MG tablet Take 1 tablet (4 mg total) by mouth every 8 (eight) hours as needed for nausea or vomiting. 10 tablet 0   OVER THE COUNTER MEDICATION Apply 1 application topically daily as needed (knee pain). CBD Cream     oxyCODONE (ROXICODONE) 5 MG immediate release tablet Take 1 tablet (5 mg total) by mouth every 4 (four) hours as needed for severe pain. 30 tablet 0   polyvinyl alcohol (LIQUIFILM TEARS) 1.4 % ophthalmic solution Place 1 drop into both eyes as needed for dry eyes.     sennosides-docusate sodium (SENOKOT-S) 8.6-50 MG tablet Take 2 tablets by mouth daily. (Patient not taking: Reported on 07/16/2020) 30 tablet 1   vitamin B-12 (CYANOCOBALAMIN) 1000 MCG tablet Take 1,000 mcg by mouth daily. (Patient not taking: Reported on 07/16/2020)     No current facility-administered medications for this visit.    Medication Side Effects: None  Allergies:  Allergies  Allergen Reactions   Nickel Itching and Rash     Past Medical History:  Diagnosis Date   Anxiety    Arthritis    Depression    Family history of adverse reaction to anesthesia 2012   Mother has cardiac  arrest after surgery   Hepatitis    high antibodies for hepatitis   Obesity     Family History  Problem Relation Age of Onset   Cancer Brother 7050       pancreatic cancer   Alcohol abuse Brother    Depression Brother    Asthma Paternal Grandmother    Heart disease Mother        Mitral valve disease.   Dementia Mother    Lung disease Father        Idiopathic pulmonary fibrosis   Heart disease Maternal Grandmother        Died after complications of angioplasty.   Depression Sister  Alcohol abuse Sister    Alcohol abuse Brother    Depression Brother    Alcohol abuse Brother    Depression Cousin    Depression Other     Social History   Socioeconomic History   Marital status: Married    Spouse name: Not on file   Number of children: Not on file   Years of education: Not on file   Highest education level: Not on file  Occupational History   Not on file  Tobacco Use   Smoking status: Never   Smokeless tobacco: Never  Vaping Use   Vaping Use: Never used  Substance and Sexual Activity   Alcohol use: Yes    Alcohol/week: 1.0 standard drink    Types: 1 Glasses of wine per week   Drug use: No   Sexual activity: Not Currently  Other Topics Concern   Not on file  Social History Narrative   She is married to another patient of [facility]. They have been married for 20  years.  No children. 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   He never smoked, and drinks maybe 1-3 glass of wine a day.   He has recently started exercising with her husband who has completed cardiac rehabilitation and is in the maintenance program. They usually walks about 2-3 days a week for 30-45 minutes.   Social Determinants of Health   Financial Resource Strain: Not on file  Food Insecurity:  Not on file  Transportation Needs: Not on file  Physical Activity: Not on file  Stress: Not on file  Social Connections: Not on file  Intimate Partner Violence: Not on file    Past Medical History, Surgical history, Social history, and Family history were reviewed and updated as appropriate.   Please see review of systems for further details on the patient's review from today.   Objective:   Physical Exam:  BP (!) 144/65   Pulse 84   Physical Exam Constitutional:      General: She is not in acute distress. Musculoskeletal:        General: No deformity.  Neurological:     Mental Status: She is alert and oriented to person, place, and time.     Coordination: Coordination normal.  Psychiatric:        Attention and Perception: Attention and perception normal. She does not perceive auditory or visual hallucinations.        Mood and Affect: Mood normal. Mood is not anxious or depressed. Affect is not labile, blunt, angry or inappropriate.        Speech: Speech normal.        Behavior: Behavior normal.        Thought Content: Thought content normal. Thought content is not paranoid or delusional. Thought content does not include homicidal or suicidal ideation. Thought content does not include homicidal or suicidal plan.        Cognition and Memory: Cognition and memory normal.        Judgment: Judgment normal.     Comments: Insight intact    Lab Review:     Component Value Date/Time   NA 138 07/18/2020 1344   NA 141 04/10/2020 0927   K 4.6 07/18/2020 1344   CL 106 07/18/2020 1344   CO2 26 07/18/2020 1344   GLUCOSE 105 (H) 07/18/2020 1344   BUN 13 07/18/2020 1344   BUN 16 04/10/2020 0927   CREATININE 0.64 07/18/2020 1344  CREATININE 0.63 12/08/2016 1030   CALCIUM 9.2 07/18/2020 1344   PROT 7.5 07/18/2020 1344   PROT 6.9 04/10/2020 0927   ALBUMIN 4.5 07/18/2020 1344   ALBUMIN 4.7 04/10/2020 0927   AST 18 07/18/2020 1344   ALT 15 07/18/2020 1344   ALKPHOS 84 07/18/2020  1344   BILITOT 0.8 07/18/2020 1344   BILITOT 0.2 04/10/2020 0927   GFRNONAA >60 07/18/2020 1344   GFRAA 88 04/09/2019 1239       Component Value Date/Time   WBC 7.1 07/18/2020 1344   RBC 5.11 07/18/2020 1344   HGB 14.7 07/18/2020 1344   HGB 14.7 04/09/2019 1239   HCT 46.3 (H) 07/18/2020 1344   HCT 43.2 04/09/2019 1239   PLT 224 07/18/2020 1344   PLT 224 04/09/2019 1239   MCV 90.6 07/18/2020 1344   MCV 89 04/09/2019 1239   MCH 28.8 07/18/2020 1344   MCHC 31.7 07/18/2020 1344   RDW 13.2 07/18/2020 1344   RDW 12.8 04/09/2019 1239   LYMPHSABS 2.1 04/09/2019 1239   MONOABS 546 12/25/2015 1405   EOSABS 0.4 04/09/2019 1239   BASOSABS 0.1 04/09/2019 1239    No results found for: POCLITH, LITHIUM   No results found for: PHENYTOIN, PHENOBARB, VALPROATE, CBMZ   .res Assessment: Plan:   Will continue current plan of care since target signs and symptoms are well controlled without any tolerability issues. Continue Vyvanse 40 mg po q am for ADHD. Continue Cymbalta 90 mg daily for anxiety and depression.  Continue Wellbutrin XL 300 mg po qd for depression.  Pt to follow-up in 3 months or sooner if clinically indicated.  Patient advised to contact office with any questions, adverse effects, or acute worsening in signs and symptoms.  Breindel was seen today for follow-up.  Diagnoses and all orders for this visit:  Major depression, recurrent, chronic (HCC) -     DULoxetine (CYMBALTA) 30 MG capsule; TAKE ONE CAPSULE BY MOUTH DAILY TAKE WITH 60 MG -     Discontinue: DULoxetine (CYMBALTA) 60 MG capsule; Take 1 capsule (60 mg total) by mouth every morning. -     buPROPion (WELLBUTRIN XL) 300 MG 24 hr tablet; Take 1 tablet (300 mg total) by mouth daily. -     DULoxetine (CYMBALTA) 60 MG capsule; Take 1 capsule (60 mg total) by mouth every morning. Take with a 30 mg capsule to equal total dose of 90 mg  Attention deficit hyperactivity disorder (ADHD), predominantly inattentive type -      lisdexamfetamine (VYVANSE) 40 MG capsule; Take 1 capsule (40 mg total) by mouth every morning. -     lisdexamfetamine (VYVANSE) 40 MG capsule; Take 1 capsule (40 mg total) by mouth every morning. -     lisdexamfetamine (VYVANSE) 40 MG capsule; Take 1 capsule (40 mg total) by mouth every morning.    Please see After Visit Summary for patient specific instructions.  Future Appointments  Date Time Provider Department Center  04/15/2021  8:30 AM Ronnald Nian, MD [facility]    No orders of the defined types were placed in this encounter.     -------------------------------

## 2020-07-28 NOTE — H&P (Signed)
KNEE ARTHROPLASTY ADMISSION H&P  Patient ID: CRISTIE MCKINNEY MRN: [id number] DOB/AGE: 10-03-1945 75 y.o.  Chief Complaint: left knee pain.  Planned Procedure Date: 07/29/20 Medical Clearance by Dr. Susann Givens   HPI: MAGDALA BRAHMBHATT is a 75 y.o. female who presents for evaluation of OA LEFT KNEE. The patient has a history of pain and functional disability in the left knee due to arthritis and has failed non-surgical conservative treatments for greater than 12 weeks to include NSAID's and/or analgesics, viscosupplementation injections, use of assistive devices, and activity modification.  Onset of symptoms was gradual, starting 2 years ago with gradually worsening course since that time. The patient noted no past surgery on the left knee.  Patient currently rates pain at 2 out of 10 with activity. Patient has worsening of pain with activity and weight bearing and pain that interferes with activities of daily living.  Patient has evidence of joint space narrowing by imaging studies.  There is no active infection.  Past Medical History:  Diagnosis Date   Anxiety    Arthritis    Depression    Family history of adverse reaction to anesthesia 2012   Mother has cardiac  arrest after surgery   Hepatitis    high antibodies for hepatitis   Obesity    Past Surgical History:  Procedure Laterality Date   LAPAROSCOPIC TUBAL LIGATION  1985   TOTAL KNEE ARTHROPLASTY Right 04/15/2020   Procedure: TOTAL KNEE ARTHROPLASTY;  Surgeon: Teryl Lucy, MD;  Location: [facility];  Service: Orthopedics;  Laterality: Right;   WISDOM TOOTH EXTRACTION     age 68   Allergies  Allergen Reactions   Nickel Itching and Rash   Prior to Admission medications   Medication Sig Start Date End Date Taking? Authorizing Provider  Cholecalciferol (VITAMIN D) 50 MCG (2000 UT) tablet Take 2,000 Units by mouth daily.   Yes [provider]  ibuprofen (ADVIL) 200 MG tablet Take 200-400 mg by mouth every 6 (six) hours as needed  for moderate pain.   Yes [provider]  OVER THE COUNTER MEDICATION Apply 1 application topically daily as needed (knee pain). CBD Cream   Yes [provider]  oxyCODONE (ROXICODONE) 5 MG immediate release tablet Take 1 tablet (5 mg total) by mouth every 4 (four) hours as needed for severe pain. 04/16/20  Yes Janine Ores K, PA-C  polyvinyl alcohol (LIQUIFILM TEARS) 1.4 % ophthalmic solution Place 1 drop into both eyes as needed for dry eyes.   Yes [provider]  Ascorbic Acid (VITAMIN C) 1000 MG tablet Take 1,000 mg by mouth daily. Patient not taking: Reported on 07/16/2020    [provider]  aspirin EC 325 MG tablet Take 1 tablet (325 mg total) by mouth 2 (two) times daily. Patient not taking: No sig reported 04/16/20   Janine Ores K, PA-C  B Complex-C (B-COMPLEX WITH VITAMIN C) tablet Take 1 tablet by mouth daily. Patient not taking: Reported on 07/16/2020    [provider]  Biotin 03546 MCG TABS Take 10,000 mcg by mouth daily. Patient not taking: Reported on 07/16/2020    [provider]  buPROPion (WELLBUTRIN XL) 300 MG 24 hr tablet Take 1 tablet (300 mg total) by mouth daily. 07/28/20   Corie Chiquito, PMHNP  cephALEXin (KEFLEX) 500 MG capsule Take 1 capsule (500 mg total) by mouth 3 (three) times daily. Patient not taking: Reported on 07/16/2020 05/28/20   Tysinger, Kermit Balo, PA-C  DULoxetine (CYMBALTA) 30 MG capsule TAKE  ONE CAPSULE BY MOUTH DAILY TAKE WITH 60 MG 07/28/20   Corie Chiquito, PMHNP  DULoxetine (CYMBALTA) 60 MG capsule Take 1 capsule (60 mg total) by mouth every morning. Take with a 30 mg capsule to equal total dose of 90 mg 07/28/20 10/26/20  Corie Chiquito, PMHNP  Ginkgo Biloba 120 MG TABS Take 120 mg by mouth daily. Patient not taking: Reported on 07/16/2020    [provider]  Glucosamine-Chondroitin 750-600 MG CHEW Chew 2 tablets by mouth daily. Patient not taking: Reported on 07/16/2020    [provider]  lisdexamfetamine (VYVANSE) 40 MG capsule Take 1 capsule (40 mg total) by mouth every morning. 08/14/20   Corie Chiquito, PMHNP  lisdexamfetamine (VYVANSE) 40 MG capsule Take 1 capsule (40 mg total) by mouth every morning. 09/11/20   Corie Chiquito, PMHNP  lisdexamfetamine (VYVANSE) 40 MG capsule Take 1 capsule (40 mg total) by mouth every morning. 10/09/20   Corie Chiquito, PMHNP  Multiple Vitamins-Minerals (MULTIVITAMIN WITH MINERALS) tablet Take 1 tablet by mouth daily. Patient not taking: Reported on 07/16/2020    [provider]  ondansetron (ZOFRAN) 4 MG tablet Take 1 tablet (4 mg total) by mouth every 8 (eight) hours as needed for nausea or vomiting. 04/16/20   Janine Ores K, PA-C  sennosides-docusate sodium (SENOKOT-S) 8.6-50 MG tablet Take 2 tablets by mouth daily. Patient not taking: Reported on 07/16/2020 04/16/20   Armida Sans, PA-C  vitamin B-12 (CYANOCOBALAMIN) 1000 MCG tablet Take 1,000 mcg by mouth daily. Patient not taking: Reported on 07/16/2020    [provider]   Social History   Socioeconomic History   Marital status: Married    Spouse name: Not on file   Number of children: Not on file   Years of education: Not on file   Highest education level: Not on file  Occupational History   Not on file  Tobacco Use   Smoking status: Never   Smokeless tobacco: Never  Vaping Use   Vaping Use: Never used  Substance and Sexual Activity   Alcohol use: Yes    Alcohol/week: 1.0 standard drink    Types: 1 Glasses of wine per week   Drug use: No   Sexual activity: Not Currently  Other Topics Concern   Not on file  Social History Narrative   She is married to another patient of [facility]. They have been married for 20  years.  No children. 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   He never smoked, and drinks maybe 1-3 glass of wine a day.   He has recently started exercising with her husband who has  completed cardiac rehabilitation and is in the maintenance program. They usually walks about 2-3 days a week for 30-45 minutes.   Social Determinants of Health   Financial Resource Strain: Not on file  Food Insecurity: Not on file  Transportation Needs: Not on file  Physical Activity: Not on file  Stress: Not on file  Social Connections: Not on file   Family History  Problem Relation Age of Onset   Cancer Brother 2       pancreatic cancer   Alcohol abuse Brother    Depression Brother    Asthma Paternal Grandmother    Heart disease Mother        Mitral valve disease.   Dementia Mother    Lung disease Father        Idiopathic pulmonary fibrosis  Heart disease Maternal Grandmother        Died after complications of angioplasty.   Depression Sister    Alcohol abuse Sister    Alcohol abuse Brother    Depression Brother    Alcohol abuse Brother    Depression Cousin    Depression Other     ROS: Currently denies lightheadedness, dizziness, Fever, chills, CP, SOB. No personal history of DVT, PE, MI, or CVA. No loose teeth or dentures All other systems have been reviewed and were otherwise currently negative with the exception of those mentioned in the HPI and as above.  Objective: Vitals: Ht: 4' 11.5" Wt: 162 lbs Temp: 97.9 BP: 149/77 Pulse: 99 O2 99% on room air.   Physical Exam: General: Alert, NAD.  Antalgic Gait  HEENT: EOMI, Good Neck Extension  Pulm: No increased work of breathing.  Clear B/L A/P w/o crackle or wheeze.  CV: RRR, No m/g/r appreciated  GI: soft, NT, ND Neuro: Neuro without gross focal deficit.  Sensation intact distally Skin: No lesions in the area of chief complaint MSK/Surgical Site: left knee w/o redness or effusion.  medial JLT. ROM 5-100.  5/5 strength in extension and flexion.  +EHL/FHL.  NVI.  Stable varus and valgus stress.    Imaging Review Plain radiographs demonstrate severe degenerative joint disease of the left knee.   Assessment: OA  LEFT KNEE Principal Problem:   Osteoarthritis of left knee   Plan: Plan for Procedure(s): TOTAL KNEE ARTHROPLASTY  The patient history, physical exam, clinical judgement of the provider and imaging are consistent with end stage degenerative joint disease and total joint arthroplasty is deemed medically necessary. The treatment options including medical management, injection therapy, and arthroplasty were discussed at length. The risks and benefits of Procedure(s): TOTAL KNEE ARTHROPLASTY were presented and reviewed.  The risks of nonoperative treatment, versus surgical intervention including but not limited to continued pain, aseptic loosening, stiffness, dislocation/subluxation, infection, bleeding, nerve injury, blood clots, cardiopulmonary complications, morbidity, mortality, among others were discussed. The patient verbalizes understanding and wishes to proceed with the plan.  Patient is being admitted for inpatient treatment for surgery, pain control, PT, prophylactic antibiotics, VTE prophylaxis, progressive ambulation, ADL's and discharge planning.   Dental prophylaxis discussed and recommended for 2 years postoperatively.  The patient does meet the criteria for TXA which will be used perioperatively.   ASA 325 mg  will be used postoperatively for DVT prophylaxis in addition to SCDs, and early ambulation. The patient is planning to be discharged home with OPPT  in care of husband    Armida Sans, Cordelia Poche 07/28/2020 5:05 PM

## 2020-07-29 DIAGNOSIS — M1712 Unilateral primary osteoarthritis, left knee: Secondary | ICD-10-CM | POA: Diagnosis present

## 2020-07-29 NOTE — Progress Notes (Signed)
Spoke to patient and gave her new arrival time of 1000 for procedure on 08-04-20.  Patient to stop clear liquids at 0930 and Covid test scheduled for 08-01-20 at 1430.

## 2020-07-29 NOTE — Care Plan (Signed)
Ortho Bundle Case Management Note  Patient Details  Name: Megan Nunez MRN: [id number] Date of Birth: [birth year]  Patient's surgery has been rescheduled to 08/04/20. All follow up appointments have been updated and communicated to patient.    DME Arranged:    DME Agency:     HH Arranged:  PT HH Agency:  [facility]  Additional Comments: Please contact me with any questions of if this plan should need to change.  Shauna Hugh,  RN,BSN,MHA,CCM  [facility]  [phone] 07/29/2020, 12:12 PM

## 2020-07-30 NOTE — Progress Notes (Signed)
Spoke to patient and gaver her new arrival time of 1055 for surgery on 08-05-20.  Patient advised to stop clear liquids at 1025.  Patient will still get Covid test on 08-01-20.  Patient stated understanding.

## 2020-08-01 ENCOUNTER — Other Ambulatory Visit (HOSPITAL_COMMUNITY)
Admission: RE | Admit: 2020-08-01 | Discharge: 2020-08-01 | Disposition: A | Discharge status: Home / Self Care | Payer: Medicare Other | Source: Ambulatory Visit | Attending: Orthopedic Surgery | Admitting: Orthopedic Surgery

## 2020-08-01 DIAGNOSIS — Z20822 Contact with and (suspected) exposure to covid-19: Secondary | ICD-10-CM | POA: Insufficient documentation

## 2020-08-01 DIAGNOSIS — Z01812 Encounter for preprocedural laboratory examination: Secondary | ICD-10-CM | POA: Insufficient documentation

## 2020-08-01 LAB — SARS CORONAVIRUS 2 (TAT 6-24 HRS): SARS Coronavirus 2: NEGATIVE

## 2020-08-05 ENCOUNTER — Ambulatory Visit (HOSPITAL_COMMUNITY): Payer: Medicare Other | Admitting: Emergency Medicine

## 2020-08-05 ENCOUNTER — Observation Stay (HOSPITAL_COMMUNITY)
Admission: RE | Admit: 2020-08-05 | Discharge: 2020-08-06 | Disposition: A | Discharge status: Home / Self Care | Payer: Medicare Other | Attending: Orthopedic Surgery | Admitting: Orthopedic Surgery

## 2020-08-05 ENCOUNTER — Ambulatory Visit (HOSPITAL_COMMUNITY): Payer: Medicare Other | Admitting: Certified Registered Nurse Anesthetist

## 2020-08-05 ENCOUNTER — Encounter (HOSPITAL_COMMUNITY)
Admission: RE | Disposition: A | Discharge status: Home / Self Care | Payer: Self-pay | Source: Home / Self Care | Attending: Orthopedic Surgery

## 2020-08-05 ENCOUNTER — Encounter (HOSPITAL_COMMUNITY): Payer: Self-pay | Admitting: Orthopedic Surgery

## 2020-08-05 ENCOUNTER — Other Ambulatory Visit: Payer: Self-pay

## 2020-08-05 DIAGNOSIS — Z96651 Presence of right artificial knee joint: Secondary | ICD-10-CM | POA: Diagnosis not present

## 2020-08-05 DIAGNOSIS — G8918 Other acute postprocedural pain: Secondary | ICD-10-CM | POA: Diagnosis not present

## 2020-08-05 DIAGNOSIS — M8580 Other specified disorders of bone density and structure, unspecified site: Secondary | ICD-10-CM | POA: Diagnosis not present

## 2020-08-05 DIAGNOSIS — M1712 Unilateral primary osteoarthritis, left knee: Secondary | ICD-10-CM | POA: Diagnosis not present

## 2020-08-05 DIAGNOSIS — Z7982 Long term (current) use of aspirin: Secondary | ICD-10-CM | POA: Diagnosis not present

## 2020-08-05 DIAGNOSIS — Z96652 Presence of left artificial knee joint: Secondary | ICD-10-CM

## 2020-08-05 HISTORY — PX: TOTAL KNEE ARTHROPLASTY: SHX125

## 2020-08-05 SURGERY — ARTHROPLASTY, KNEE, TOTAL
Anesthesia: Spinal | Site: Knee | Laterality: Left

## 2020-08-05 MED ORDER — GLYCOPYRROLATE PF 0.2 MG/ML IJ SOSY
PREFILLED_SYRINGE | INTRAMUSCULAR | Status: DC | PRN
  Administered 2020-08-05: .1 mg via INTRAVENOUS

## 2020-08-05 MED ORDER — FENTANYL CITRATE (PF) 100 MCG/2ML IJ SOLN
INTRAMUSCULAR | Status: AC
  Filled 2020-08-05: qty 2

## 2020-08-05 MED ORDER — ONDANSETRON HCL 4 MG/2ML IJ SOLN
INTRAMUSCULAR | Status: AC
  Filled 2020-08-05: qty 2

## 2020-08-05 MED ORDER — FENTANYL CITRATE (PF) 100 MCG/2ML IJ SOLN: 25.0000 ug | INTRAMUSCULAR | Status: DC | PRN

## 2020-08-05 MED ORDER — POVIDONE-IODINE 10 % EX SWAB
2.0000 "application " | Freq: Once | CUTANEOUS | Status: AC
  Administered 2020-08-05: 2 via TOPICAL

## 2020-08-05 MED ORDER — PROPOFOL 500 MG/50ML IV EMUL
INTRAVENOUS | Status: AC
  Filled 2020-08-05: qty 50

## 2020-08-05 MED ORDER — EPHEDRINE SULFATE-NACL 50-0.9 MG/10ML-% IV SOSY
PREFILLED_SYRINGE | INTRAVENOUS | Status: DC | PRN
  Administered 2020-08-05: 5 mg via INTRAVENOUS

## 2020-08-05 MED ORDER — METHOCARBAMOL 500 MG PO TABS: 500.0000 mg | ORAL_TABLET | Freq: Four times a day (QID) | ORAL | Status: DC | PRN

## 2020-08-05 MED ORDER — ONDANSETRON HCL 4 MG PO TABS: 4.0000 mg | ORAL_TABLET | Freq: Four times a day (QID) | ORAL | Status: DC | PRN

## 2020-08-05 MED ORDER — BUPROPION HCL ER (XL) 300 MG PO TB24
300.0000 mg | ORAL_TABLET | Freq: Every day | ORAL | Status: DC
  Administered 2020-08-06: 300 mg via ORAL
  Filled 2020-08-05: qty 1

## 2020-08-05 MED ORDER — AMISULPRIDE (ANTIEMETIC) 5 MG/2ML IV SOLN: 10.0000 mg | Freq: Once | INTRAVENOUS | Status: DC | PRN

## 2020-08-05 MED ORDER — CEFAZOLIN SODIUM-DEXTROSE 1-4 GM/50ML-% IV SOLN
1.0000 g | Freq: Once | INTRAVENOUS | Status: AC
  Administered 2020-08-05: 1 g via INTRAVENOUS

## 2020-08-05 MED ORDER — LIDOCAINE 2% (20 MG/ML) 5 ML SYRINGE
INTRAMUSCULAR | Status: AC
  Filled 2020-08-05: qty 5

## 2020-08-05 MED ORDER — OXYCODONE HCL 5 MG PO TABS
5.0000 mg | ORAL_TABLET | ORAL | Status: DC | PRN
  Administered 2020-08-05: 10 mg via ORAL

## 2020-08-05 MED ORDER — DULOXETINE HCL 30 MG PO CPEP
30.0000 mg | ORAL_CAPSULE | Freq: Every day | ORAL | Status: DC
  Administered 2020-08-06: 30 mg via ORAL
  Filled 2020-08-05: qty 1

## 2020-08-05 MED ORDER — ACETAMINOPHEN 500 MG PO TABS
1000.0000 mg | ORAL_TABLET | Freq: Four times a day (QID) | ORAL | Status: DC
  Administered 2020-08-05 – 2020-08-06 (×3): 1000 mg via ORAL
  Filled 2020-08-05 (×3): qty 2

## 2020-08-05 MED ORDER — MIDAZOLAM HCL 2 MG/2ML IJ SOLN
1.0000 mg | INTRAMUSCULAR | Status: DC
Start: 2020-08-05 — End: 2020-08-05
  Filled 2020-08-05: qty 2

## 2020-08-05 MED ORDER — KETOROLAC TROMETHAMINE 30 MG/ML IJ SOLN
INTRAMUSCULAR | Status: DC | PRN
  Administered 2020-08-05: 30 mg

## 2020-08-05 MED ORDER — FENTANYL CITRATE (PF) 100 MCG/2ML IJ SOLN
50.0000 ug | INTRAMUSCULAR | Status: DC
  Administered 2020-08-05: 50 ug via INTRAVENOUS
  Filled 2020-08-05: qty 2

## 2020-08-05 MED ORDER — BUPIVACAINE-EPINEPHRINE (PF) 0.25% -1:200000 IJ SOLN
INTRAMUSCULAR | Status: AC
  Filled 2020-08-05: qty 30

## 2020-08-05 MED ORDER — ACETAMINOPHEN 500 MG PO TABS
1000.0000 mg | ORAL_TABLET | Freq: Once | ORAL | Status: AC
  Administered 2020-08-05: 1000 mg via ORAL
  Filled 2020-08-05: qty 2

## 2020-08-05 MED ORDER — POTASSIUM CHLORIDE IN NACL 20-0.45 MEQ/L-% IV SOLN
INTRAVENOUS | Status: DC
  Filled 2020-08-05 (×3): qty 1000

## 2020-08-05 MED ORDER — SODIUM CHLORIDE 0.9 % IR SOLN
Status: DC | PRN
  Administered 2020-08-05 (×2): 1000 mL

## 2020-08-05 MED ORDER — KETOROLAC TROMETHAMINE 30 MG/ML IJ SOLN
INTRAMUSCULAR | Status: AC
  Filled 2020-08-05: qty 1

## 2020-08-05 MED ORDER — METOCLOPRAMIDE HCL 5 MG/ML IJ SOLN: 5.0000 mg | Freq: Three times a day (TID) | INTRAMUSCULAR | Status: DC | PRN

## 2020-08-05 MED ORDER — ONDANSETRON HCL 4 MG/2ML IJ SOLN
INTRAMUSCULAR | Status: DC | PRN
  Administered 2020-08-05: 4 mg via INTRAVENOUS

## 2020-08-05 MED ORDER — TRANEXAMIC ACID-NACL 1000-0.7 MG/100ML-% IV SOLN
1000.0000 mg | INTRAVENOUS | Status: AC
  Administered 2020-08-05: 1000 mg via INTRAVENOUS
  Filled 2020-08-05: qty 100

## 2020-08-05 MED ORDER — BUPIVACAINE IN DEXTROSE 0.75-8.25 % IT SOLN
INTRATHECAL | Status: DC | PRN
  Administered 2020-08-05: 1.6 mL via INTRATHECAL

## 2020-08-05 MED ORDER — CEFAZOLIN SODIUM-DEXTROSE 1-4 GM/50ML-% IV SOLN
INTRAVENOUS | Status: AC
  Filled 2020-08-05: qty 50

## 2020-08-05 MED ORDER — MIDAZOLAM HCL 2 MG/2ML IJ SOLN
INTRAMUSCULAR | Status: DC | PRN
  Administered 2020-08-05: 1 mg via INTRAVENOUS

## 2020-08-05 MED ORDER — PROPOFOL 500 MG/50ML IV EMUL
INTRAVENOUS | Status: DC | PRN
  Administered 2020-08-05: 100 ug/kg/min via INTRAVENOUS

## 2020-08-05 MED ORDER — PHENOL 1.4 % MT LIQD: 1.0000 | OROMUCOSAL | Status: DC | PRN

## 2020-08-05 MED ORDER — DEXAMETHASONE SODIUM PHOSPHATE 10 MG/ML IJ SOLN
INTRAMUSCULAR | Status: DC | PRN
  Administered 2020-08-05: 8 mg via INTRAVENOUS

## 2020-08-05 MED ORDER — PROPOFOL 10 MG/ML IV BOLUS
INTRAVENOUS | Status: DC | PRN
  Administered 2020-08-05: 30 mg via INTRAVENOUS

## 2020-08-05 MED ORDER — GLYCOPYRROLATE PF 0.2 MG/ML IJ SOSY
PREFILLED_SYRINGE | INTRAMUSCULAR | Status: AC
  Filled 2020-08-05: qty 1

## 2020-08-05 MED ORDER — BUPIVACAINE-EPINEPHRINE (PF) 0.25% -1:200000 IJ SOLN
INTRAMUSCULAR | Status: DC | PRN
  Administered 2020-08-05: 30 mL

## 2020-08-05 MED ORDER — PHENYLEPHRINE 40 MCG/ML (10ML) SYRINGE FOR IV PUSH (FOR BLOOD PRESSURE SUPPORT)
PREFILLED_SYRINGE | INTRAVENOUS | Status: AC
  Filled 2020-08-05: qty 10

## 2020-08-05 MED ORDER — DEXAMETHASONE SODIUM PHOSPHATE 10 MG/ML IJ SOLN
INTRAMUSCULAR | Status: AC
  Filled 2020-08-05: qty 1

## 2020-08-05 MED ORDER — POLYETHYLENE GLYCOL 3350 17 G PO PACK: 17.0000 g | PACK | Freq: Every day | ORAL | Status: DC | PRN

## 2020-08-05 MED ORDER — EPHEDRINE 5 MG/ML INJ
INTRAVENOUS | Status: AC
  Filled 2020-08-05: qty 5

## 2020-08-05 MED ORDER — LACTATED RINGERS IV SOLN: INTRAVENOUS | Status: DC

## 2020-08-05 MED ORDER — METOCLOPRAMIDE HCL 5 MG PO TABS: 5.0000 mg | ORAL_TABLET | Freq: Three times a day (TID) | ORAL | Status: DC | PRN

## 2020-08-05 MED ORDER — ASPIRIN EC 325 MG PO TBEC
325.0000 mg | DELAYED_RELEASE_TABLET | Freq: Every day | ORAL | Status: DC
  Administered 2020-08-06: 325 mg via ORAL
  Filled 2020-08-05: qty 1

## 2020-08-05 MED ORDER — PROPOFOL 10 MG/ML IV BOLUS
INTRAVENOUS | Status: AC
  Filled 2020-08-05: qty 20

## 2020-08-05 MED ORDER — PHENYLEPHRINE 40 MCG/ML (10ML) SYRINGE FOR IV PUSH (FOR BLOOD PRESSURE SUPPORT)
PREFILLED_SYRINGE | INTRAVENOUS | Status: DC | PRN
  Administered 2020-08-05: 120 ug via INTRAVENOUS

## 2020-08-05 MED ORDER — PHENYLEPHRINE HCL (PRESSORS) 10 MG/ML IV SOLN
INTRAVENOUS | Status: AC
  Filled 2020-08-05: qty 1

## 2020-08-05 MED ORDER — DOCUSATE SODIUM 100 MG PO CAPS
100.0000 mg | ORAL_CAPSULE | Freq: Two times a day (BID) | ORAL | Status: DC
  Administered 2020-08-05 – 2020-08-06 (×2): 100 mg via ORAL
  Filled 2020-08-05 (×2): qty 1

## 2020-08-05 MED ORDER — DIPHENHYDRAMINE HCL 12.5 MG/5ML PO ELIX: 12.5000 mg | ORAL_SOLUTION | ORAL | Status: DC | PRN

## 2020-08-05 MED ORDER — ALUM & MAG HYDROXIDE-SIMETH 200-200-20 MG/5ML PO SUSP
30.0000 mL | ORAL | Status: DC | PRN
Start: 2020-08-05 — End: 2020-08-06

## 2020-08-05 MED ORDER — MIDAZOLAM HCL 2 MG/2ML IJ SOLN
INTRAMUSCULAR | Status: AC
  Filled 2020-08-05: qty 2

## 2020-08-05 MED ORDER — CELECOXIB 200 MG PO CAPS
200.0000 mg | ORAL_CAPSULE | Freq: Once | ORAL | Status: AC
  Administered 2020-08-05: 200 mg via ORAL
  Filled 2020-08-05: qty 1

## 2020-08-05 MED ORDER — CHLORHEXIDINE GLUCONATE 0.12 % MT SOLN: 15.0000 mL | Freq: Once | OROMUCOSAL | Status: AC

## 2020-08-05 MED ORDER — ONDANSETRON HCL 4 MG/2ML IJ SOLN: 4.0000 mg | Freq: Four times a day (QID) | INTRAMUSCULAR | Status: DC | PRN

## 2020-08-05 MED ORDER — ORAL CARE MOUTH RINSE
15.0000 mL | Freq: Once | OROMUCOSAL | Status: AC
  Administered 2020-08-05: 15 mL via OROMUCOSAL

## 2020-08-05 MED ORDER — MENTHOL 3 MG MT LOZG: 1.0000 | LOZENGE | OROMUCOSAL | Status: DC | PRN

## 2020-08-05 MED ORDER — ACETAMINOPHEN 500 MG PO TABS: 1000.0000 mg | ORAL_TABLET | Freq: Once | ORAL | Status: DC

## 2020-08-05 MED ORDER — PHENYLEPHRINE HCL-NACL 10-0.9 MG/250ML-% IV SOLN
INTRAVENOUS | Status: DC | PRN
  Administered 2020-08-05: 30 ug/min via INTRAVENOUS

## 2020-08-05 MED ORDER — HYDROMORPHONE HCL 1 MG/ML IJ SOLN: 0.5000 mg | INTRAMUSCULAR | Status: DC | PRN

## 2020-08-05 MED ORDER — BISACODYL 10 MG RE SUPP: 10.0000 mg | Freq: Every day | RECTAL | Status: DC | PRN

## 2020-08-05 MED ORDER — SODIUM CHLORIDE 0.9 % IV SOLN
2.0000 g | Freq: Four times a day (QID) | INTRAVENOUS | Status: AC
  Administered 2020-08-05 – 2020-08-06 (×2): 2 g via INTRAVENOUS
  Filled 2020-08-05 (×2): qty 2

## 2020-08-05 MED ORDER — ACETAMINOPHEN 325 MG PO TABS: 325.0000 mg | ORAL_TABLET | Freq: Four times a day (QID) | ORAL | Status: DC | PRN

## 2020-08-05 MED ORDER — FENTANYL CITRATE (PF) 100 MCG/2ML IJ SOLN
INTRAMUSCULAR | Status: DC | PRN
  Administered 2020-08-05: 50 ug via INTRAVENOUS

## 2020-08-05 MED ORDER — DULOXETINE HCL 60 MG PO CPEP
60.0000 mg | ORAL_CAPSULE | Freq: Every morning | ORAL | Status: DC
  Administered 2020-08-06: 60 mg via ORAL
  Filled 2020-08-05: qty 1

## 2020-08-05 MED ORDER — MAGNESIUM CITRATE PO SOLN: 1.0000 | Freq: Once | ORAL | Status: DC | PRN

## 2020-08-05 MED ORDER — TRANEXAMIC ACID-NACL 1000-0.7 MG/100ML-% IV SOLN
1000.0000 mg | Freq: Once | INTRAVENOUS | Status: AC
  Administered 2020-08-05: 1000 mg via INTRAVENOUS
  Filled 2020-08-05: qty 100

## 2020-08-05 MED ORDER — DEXAMETHASONE SODIUM PHOSPHATE 10 MG/ML IJ SOLN
10.0000 mg | Freq: Once | INTRAMUSCULAR | Status: AC
  Administered 2020-08-06: 10 mg via INTRAVENOUS
  Filled 2020-08-05: qty 1

## 2020-08-05 MED ORDER — OXYCODONE HCL 5 MG PO TABS
10.0000 mg | ORAL_TABLET | ORAL | Status: DC | PRN
  Administered 2020-08-06 (×3): 15 mg via ORAL
  Filled 2020-08-05 (×3): qty 3
  Filled 2020-08-05: qty 2

## 2020-08-05 MED ORDER — ZOLPIDEM TARTRATE 5 MG PO TABS: 5.0000 mg | ORAL_TABLET | Freq: Every evening | ORAL | Status: DC | PRN

## 2020-08-05 MED ORDER — METHOCARBAMOL 1000 MG/10ML IJ SOLN
500.0000 mg | Freq: Four times a day (QID) | INTRAVENOUS | Status: DC | PRN
  Filled 2020-08-05: qty 5

## 2020-08-05 MED ORDER — BUPIVACAINE-EPINEPHRINE (PF) 0.5% -1:200000 IJ SOLN
INTRAMUSCULAR | Status: DC | PRN
  Administered 2020-08-05: 20 mL

## 2020-08-05 MED ORDER — SODIUM CHLORIDE 0.9 % IV SOLN
2.0000 g | INTRAVENOUS | Status: AC
  Administered 2020-08-05: 2 g via INTRAVENOUS
  Filled 2020-08-05: qty 2

## 2020-08-05 MED ORDER — PROMETHAZINE HCL 25 MG/ML IJ SOLN: 6.2500 mg | INTRAMUSCULAR | Status: DC | PRN

## 2020-08-05 SURGICAL SUPPLY — 72 items
AUG MED ASF PS 4-5 C-D 10 (Joint) ×2 IMPLANT
AUGMENT MED ASF PS 4-5 C-D 10 (Joint) IMPLANT
BAG COUNTER SPONGE SURGICOUNT (BAG) ×1 IMPLANT
BAG SPEC THK2 15X12 ZIP CLS (MISCELLANEOUS) ×1
BAG SPNG CNTER NS LX DISP (BAG) ×1
BAG ZIPLOCK 12X15 (MISCELLANEOUS) ×1 IMPLANT
BLADE SAG 18X100X1.27 (BLADE) ×2 IMPLANT
BLADE SAW SGTL 11.0X1.19X90.0M (BLADE) ×2 IMPLANT
BLADE SAW SGTL 13X75X1.27 (BLADE) ×2 IMPLANT
BLADE SURG 15 STRL LF DISP TIS (BLADE) ×1 IMPLANT
BLADE SURG 15 STRL SS (BLADE) ×2
BNDG CMPR MED 10X6 ELC LF (GAUZE/BANDAGES/DRESSINGS) ×1
BNDG ELASTIC 6X10 VLCR STRL LF (GAUZE/BANDAGES/DRESSINGS) ×2 IMPLANT
BOWL SMART MIX CTS (DISPOSABLE) ×2 IMPLANT
BSPLAT TIB 5D D CMNT STM LT (Knees) ×1 IMPLANT
CEMENT HV SMART SET (Cement) ×4 IMPLANT
CLSR STERI-STRIP ANTIMIC 1/2X4 (GAUZE/BANDAGES/DRESSINGS) ×4 IMPLANT
CMPNT FEM 5 STD CMNT STRL LF (Joint) ×1 IMPLANT
COMP FEM STD PS 5 LT (Joint) ×2 IMPLANT
COMPONENT FEM 5  CMNT STRL LF (Joint) IMPLANT
COVER SURGICAL LIGHT HANDLE (MISCELLANEOUS) ×2 IMPLANT
CUFF TOURN SGL QUICK 34 (TOURNIQUET CUFF) ×2
CUFF TRNQT CYL 34X4.125X (TOURNIQUET CUFF) ×1 IMPLANT
DECANTER SPIKE VIAL GLASS SM (MISCELLANEOUS) IMPLANT
DRAPE SHEET LG 3/4 BI-LAMINATE (DRAPES) ×2 IMPLANT
DRAPE U-SHAPE 47X51 STRL (DRAPES) ×2 IMPLANT
DRSG MEPILEX BORDER 4X12 (GAUZE/BANDAGES/DRESSINGS) ×2 IMPLANT
DRSG PAD ABDOMINAL 8X10 ST (GAUZE/BANDAGES/DRESSINGS) ×4 IMPLANT
DURAPREP 26ML APPLICATOR (WOUND CARE) ×4 IMPLANT
ELECT REM PT RETURN 15FT ADLT (MISCELLANEOUS) ×2 IMPLANT
GLOVE SRG 8 PF TXTR STRL LF DI (GLOVE) ×1 IMPLANT
GLOVE SURG ENC MOIS LTX SZ6.5 (GLOVE) ×2 IMPLANT
GLOVE SURG ENC MOIS LTX SZ7.5 (GLOVE) ×2 IMPLANT
GLOVE SURG UNDER POLY LF SZ7 (GLOVE) ×2 IMPLANT
GLOVE SURG UNDER POLY LF SZ8 (GLOVE) ×2
GOWN STRL REUS W/ TWL LRG LVL3 (GOWN DISPOSABLE) ×2 IMPLANT
GOWN STRL REUS W/TWL LRG LVL3 (GOWN DISPOSABLE) ×4
HANDPIECE INTERPULSE COAX TIP (DISPOSABLE) ×2
HDLS TROCR DRIL PIN KNEE 75 (PIN) ×2
HOLDER FOLEY CATH W/STRAP (MISCELLANEOUS) ×1 IMPLANT
HOOD PEEL AWAY FLYTE STAYCOOL (MISCELLANEOUS) ×6 IMPLANT
IMMOBILIZER KNEE 20 (SOFTGOODS) ×2
IMMOBILIZER KNEE 20 THIGH 36 (SOFTGOODS) ×1 IMPLANT
KIT TURNOVER KIT A (KITS) ×2 IMPLANT
MANIFOLD NEPTUNE II (INSTRUMENTS) ×2 IMPLANT
NDL SAFETY ECLIPSE 18X1.5 (NEEDLE) IMPLANT
NEEDLE HYPO 18GX1.5 SHARP (NEEDLE) ×2
NS IRRIG 1000ML POUR BTL (IV SOLUTION) ×2 IMPLANT
PACK ICE MAXI GEL EZY WRAP (MISCELLANEOUS) ×2 IMPLANT
PACK TOTAL KNEE CUSTOM (KITS) ×2 IMPLANT
PENCIL SMOKE EVACUATOR (MISCELLANEOUS) ×2 IMPLANT
PERSONA  ARTICULAR SURFACE MEDIAL CONGRUENT LEFT (Orthopedic Implant) ×1 IMPLANT
PERSONA FEMUR CEMENTED CRUCIATE RETAINING STANDARD (Orthopedic Implant) ×1 IMPLANT
PIN DRILL HDLS TROCAR 75 4PK (PIN) IMPLANT
PROTECTOR NERVE ULNAR (MISCELLANEOUS) ×2 IMPLANT
SCREW FEMALE HEX FIX 25X2.5 (ORTHOPEDIC DISPOSABLE SUPPLIES) ×5 IMPLANT
SET HNDPC FAN SPRY TIP SCT (DISPOSABLE) ×1 IMPLANT
SET PAD KNEE POSITIONER (MISCELLANEOUS) ×2 IMPLANT
SPONGE T-LAP 18X18 ~~LOC~~+RFID (SPONGE) ×3 IMPLANT
STEM POLY PAT PLY 32M KNEE (Knees) ×1 IMPLANT
STEM TIBIA 5 DEG SZ D L KNEE (Knees) IMPLANT
SUT VIC AB 1 CT1 36 (SUTURE) ×4 IMPLANT
SUT VIC AB 2-0 CT1 27 (SUTURE) ×4
SUT VIC AB 2-0 CT1 TAPERPNT 27 (SUTURE) ×1 IMPLANT
SUT VIC AB 3-0 SH 8-18 (SUTURE) ×2 IMPLANT
SYR 3ML LL SCALE MARK (SYRINGE) ×1 IMPLANT
TIBIA STEM 5 DEG SZ D L KNEE (Knees) ×2 IMPLANT
TRAY FOLEY MTR SLVR 14FR STAT (SET/KITS/TRAYS/PACK) ×1 IMPLANT
TRAY FOLEY MTR SLVR 16FR STAT (SET/KITS/TRAYS/PACK) ×1 IMPLANT
TUBE SUCTION HIGH CAP CLEAR NV (SUCTIONS) ×2 IMPLANT
WATER STERILE IRR 1000ML POUR (IV SOLUTION) ×4 IMPLANT
WRAP KNEE MAXI GEL POST OP (GAUZE/BANDAGES/DRESSINGS) ×1 IMPLANT

## 2020-08-05 NOTE — Progress Notes (Signed)
AssistedDr. Brock with left, ultrasound guided, adductor canal block. Side rails up, monitors on throughout procedure. See vital signs in flow sheet. Tolerated Procedure well.  

## 2020-08-05 NOTE — Anesthesia Postprocedure Evaluation (Signed)
Anesthesia Post Note  Patient: Megan Nunez  Procedure(s) Performed: TOTAL KNEE ARTHROPLASTY (Left: Knee)     Patient location during evaluation: PACU Anesthesia Type: Spinal Level of consciousness: awake and alert and oriented Pain management: pain level controlled Vital Signs Assessment: post-procedure vital signs reviewed and stable Respiratory status: spontaneous breathing, nonlabored ventilation and respiratory function stable Cardiovascular status: blood pressure returned to baseline Postop Assessment: no apparent nausea or vomiting, spinal receding, no headache and no backache Anesthetic complications: no   No notable events documented.  Last Vitals:  Vitals:   08/05/20 1900 08/05/20 1915  BP: 134/60 129/67  Pulse: 84 71  Resp: (!) 21 16  Temp:    SpO2: 99% 100%    Last Pain:  Vitals:   08/05/20 1916  TempSrc:   PainSc: 0-No pain                 Kaylyn Layer

## 2020-08-05 NOTE — Op Note (Signed)
DATE OF SURGERY:  08/05/2020 TIME: 6:06 PM  PATIENT NAME:  Megan Nunez   AGE: 75 y.o.    PRE-OPERATIVE DIAGNOSIS: Left knee primary localized osteoarthritis  POST-OPERATIVE DIAGNOSIS:  Same  PROCEDURE: LEFT total Knee Arthroplasty  SURGEON:  Eulas Post, MD   ASSISTANT:  Janine Ores, PA-C, present and scrubbed throughout the case, critical for assistance with exposure, retraction, instrumentation, and closure.   OPERATIVE IMPLANTS: Zimmer persona nickel free nitride femur cruciate retaining left size 5, size D5 degree stemmed tibia with a medial congruent size 10 mm vitamin E polyethylene with a size 32 x 8.5 mm patellar button  PREOPERATIVE INDICATIONS:  Megan Nunez is a 75 y.o. year old female with end stage bone on bone degenerative arthritis of the knee who failed conservative treatment, including injections, antiinflammatories, activity modification, and assistive devices, and had significant impairment of their activities of daily living, and elected for Total Knee Arthroplasty.   The risks, benefits, and alternatives were discussed at length including but not limited to the risks of infection, bleeding, nerve injury, stiffness, blood clots, the need for revision surgery, cardiopulmonary complications, among others, and they were willing to proceed.  OPERATIVE FINDINGS AND UNIQUE ASPECTS OF THE CASE: She had symmetric wear similar to the other side although her medial side was much worse than the lateral.  I referenced the tibial cut with 2 mm off of the medial side which was significantly eburnated and scalloped out.  The patella was just large enough to accommodate a 32, even though she had a 29 on the other side.  ESTIMATED BLOOD LOSS: 100 mL  OPERATIVE DESCRIPTION:  The patient was brought to the operative room and placed in a supine position.  Anesthesia was administered.  IV antibiotics were given.  The lower extremity was prepped and draped in the usual sterile  fashion.  Time out was performed.  The leg was elevated and exsanguinated and the tourniquet was inflated.  Anterior quadriceps tendon splitting approach was performed.  The patella was everted and osteophytes were removed.  The anterior horn of the medial and lateral meniscus was removed.   The patella was then measured, and cut with the saw.  The thickness before the cut was 21.5 and after the cut was 15.  A metal shield was used to protect the patella throughout the case.    The distal femur was opened with the drill and the intramedullary distal femoral cutting jig was utilized, set at 5 degrees resecting 10 mm off the distal femur.  Care was taken to protect the collateral ligaments.  Then the extramedullary tibial cutting jig was utilized making the appropriate cut using the anterior tibial crest as a reference building in appropriate posterior slope.  Care was taken during the cut to protect the medial and collateral ligaments.  The proximal tibia was removed along with the posterior horns of the menisci.  The PCL was sacrificed.    The extensor gap was measured and found to have adequate resection, measuring to a size 10.    The distal femoral sizing jig was applied, taking care to avoid notching.  This was set at 3 degrees of external rotation.  Then the 4-in-1 cutting jig was applied and the anterior and posterior femur was cut, along with the chamfer cuts.  All posterior osteophytes were removed.  The flexion gap was then measured and was symmetric with the extension gap.  The proximal tibia sized and prepared accordingly with the reamer  and the punch, and then all components were trialed with the poly insert.  The knee was found to have excellent balance and full motion.    The above named components were then cemented into place and all excess cement was removed.  The real polyethylene implant was placed.  After the cement had cured I released the tourniquet and confirmed excellent  hemostasis with no major posterior vessel injury.    The knee was easily taken through a range of motion and the patella tracked well and the knee irrigated copiously and the parapatellar and subcutaneous tissue closed with vicryl, and monocryl with steri strips for the skin.  The wounds were injected with marcaine, and dressed with sterile gauze and the patient was awakened and returned to the PACU in stable and satisfactory condition.  There were no complications.  Total tourniquet time was 90 minutes.

## 2020-08-05 NOTE — Anesthesia Procedure Notes (Signed)
Anesthesia Regional Block: Adductor canal block   Pre-Anesthetic Checklist: , timeout performed,  Correct Patient, Correct Site, Correct Laterality,  Correct Procedure, Correct Position, site marked,  Risks and benefits discussed,  Surgical consent,  Pre-op evaluation,  At surgeon's request and post-op pain management  Laterality: Left  Prep: chloraprep       Needles:  Injection technique: Single-shot  Needle Type: Echogenic Needle     Needle Length: 10cm  Needle Gauge: 21     Additional Needles:   Narrative:  Start time: 08/05/2020 1:10 PM End time: 08/05/2020 1:13 PM Injection made incrementally with aspirations every 5 mL.  Performed by: Personally  Anesthesiologist: Beryle Lathe, MD  Additional Notes: No pain on injection. No increased resistance to injection. Injection made in 5cc increments. Good needle visualization. Patient tolerated the procedure well.

## 2020-08-05 NOTE — Anesthesia Procedure Notes (Signed)
Spinal  Patient location during procedure: OR Start time: 08/05/2020 2:43 PM End time: 08/05/2020 2:49 PM Reason for block: surgical anesthesia Staffing Performed: anesthesiologist  Anesthesiologist: Beryle Lathe, MD Preanesthetic Checklist Completed: patient identified, IV checked, risks and benefits discussed, surgical consent, monitors and equipment checked, pre-op evaluation and timeout performed Spinal Block Patient position: sitting Prep: DuraPrep Patient monitoring: heart rate, cardiac monitor, continuous pulse ox and blood pressure Approach: midline Location: L3-4 Injection technique: single-shot Needle Needle type: Pencan  Needle gauge: 24 G Assessment Events: second provider Additional Notes Consent was obtained prior to the procedure with all questions answered and concerns addressed. Risks including, but not limited to, bleeding, infection, nerve damage, paralysis, failed block, inadequate analgesia, allergic reaction, high spinal, itching, and headache were discussed and the patient wished to proceed. Functioning IV was confirmed and monitors were applied. Sterile prep and drape, including hand hygiene, mask, and sterile gloves were used. The patient was positioned and the spine was prepped. The skin was anesthetized with lidocaine. After failed attempt by CRNA, MD successful on first attempt. Free flow of clear CSF was obtained prior to injecting local anesthetic into the CSF. The spinal needle aspirated freely following injection. The needle was carefully withdrawn. The patient tolerated the procedure well.   Leslye Peer, MD

## 2020-08-05 NOTE — Interval H&P Note (Signed)
History and Physical Interval Note:  08/05/2020 1:22 PM  Megan Nunez  has presented today for surgery, with the diagnosis of OA LEFT KNEE.  The various methods of treatment have been discussed with the patient and family. After consideration of risks, benefits and other options for treatment, the patient has consented to  Procedure(s): TOTAL KNEE ARTHROPLASTY (Left) as a surgical intervention.  The patient's history has been reviewed, patient examined, no change in status, stable for surgery.  I have reviewed the patient's chart and labs.  Questions were answered to the patient's satisfaction.     Eulas Post

## 2020-08-05 NOTE — Anesthesia Preprocedure Evaluation (Addendum)
Anesthesia Evaluation  Patient identified by MRN, date of birth, ID band Patient awake    Reviewed: Allergy & Precautions, H&P , NPO status , Patient's Chart, lab work & pertinent test results  Airway Mallampati: III  TM Distance: >3 FB Neck ROM: Full    Dental  (+) Dental Advisory Given, Chipped   Pulmonary neg pulmonary ROS,    Pulmonary exam normal        Cardiovascular Exercise Tolerance: Good negative cardio ROS Normal cardiovascular exam     Neuro/Psych PSYCHIATRIC DISORDERS Anxiety Depression negative neurological ROS     GI/Hepatic negative GI ROS, Neg liver ROS,   Endo/Other  negative endocrine ROS  Renal/GU negative Renal ROS  negative genitourinary   Musculoskeletal  (+) Arthritis , Osteoarthritis,    Abdominal   Peds  Hematology negative hematology ROS (+)   Anesthesia Other Findings   Reproductive/Obstetrics negative OB ROS                            Anesthesia Physical  Anesthesia Plan  ASA: 2  Anesthesia Plan: Spinal   Post-op Pain Management:  Regional for Post-op pain   Induction: Intravenous  PONV Risk Score and Plan: 3 and Ondansetron, Dexamethasone, Propofol infusion and Treatment may vary due to age or medical condition  Airway Management Planned: Simple Face Mask and Natural Airway  Additional Equipment: None  Intra-op Plan:   Post-operative Plan:   Informed Consent: I have reviewed the patients History and Physical, chart, labs and discussed the procedure including the risks, benefits and alternatives for the proposed anesthesia with the patient or authorized representative who has indicated his/her understanding and acceptance.       Plan Discussed with: Anesthesiologist  Anesthesia Plan Comments: (Labs reviewed, platelets acceptable. Discussed risks and benefits of spinal, including spinal/epidural hematoma, infection, failed block, and PDPH.  Patient expressed understanding and wished to proceed. )       Anesthesia Quick Evaluation

## 2020-08-05 NOTE — Transfer of Care (Signed)
Immediate Anesthesia Transfer of Care Note  Patient: Megan Nunez  Procedure(s) Performed: TOTAL KNEE ARTHROPLASTY (Left: Knee)  Patient Location: PACU  Anesthesia Type:Spinal  Level of Consciousness: awake, alert , oriented and patient cooperative  Airway & Oxygen Therapy: Patient Spontanous Breathing and Patient connected to face mask oxygen  Post-op Assessment: Report given to RN, Post -op Vital signs reviewed and stable and Patient moving all extremities X 4  Post vital signs: stable  Last Vitals:  Vitals Value Taken Time  BP 130/65 08/05/20 1830  Temp    Pulse 81 08/05/20 1831  Resp 18 08/05/20 1831  SpO2 95 % 08/05/20 1831  Vitals shown include unvalidated device data.  Last Pain:  Vitals:   08/05/20 1132  TempSrc: Oral  PainSc:          Complications: No notable events documented.

## 2020-08-06 ENCOUNTER — Encounter (HOSPITAL_COMMUNITY): Payer: Self-pay | Admitting: Orthopedic Surgery

## 2020-08-06 DIAGNOSIS — M1712 Unilateral primary osteoarthritis, left knee: Secondary | ICD-10-CM | POA: Diagnosis not present

## 2020-08-06 DIAGNOSIS — Z7982 Long term (current) use of aspirin: Secondary | ICD-10-CM | POA: Diagnosis not present

## 2020-08-06 DIAGNOSIS — Z96651 Presence of right artificial knee joint: Secondary | ICD-10-CM | POA: Diagnosis not present

## 2020-08-06 LAB — BASIC METABOLIC PANEL
Anion gap: 6 (ref 5–15)
BUN: 14 mg/dL (ref 8–23)
CO2: 26 mmol/L (ref 22–32)
Calcium: 8.8 mg/dL — ABNORMAL LOW (ref 8.9–10.3)
Chloride: 106 mmol/L (ref 98–111)
Creatinine, Ser: 0.6 mg/dL (ref 0.44–1.00)
GFR, Estimated: >60 mL/min (ref >60)
Glucose, Bld: 139 mg/dL — ABNORMAL HIGH (ref 70–99)
Potassium: 4.5 mmol/L (ref 3.5–5.1)
Sodium: 138 mmol/L (ref 135–145)

## 2020-08-06 LAB — CBC
HCT: 39.9 % (ref 36.0–46.0)
Hemoglobin: 13 g/dL (ref 12.0–15.0)
MCH: 29.1 pg (ref 26.0–34.0)
MCHC: 32.6 g/dL (ref 30.0–36.0)
MCV: 89.3 fL (ref 80.0–100.0)
Platelets: 184 10*3/uL (ref 150–400)
RBC: 4.47 MIL/uL (ref 3.87–5.11)
RDW: 13 % (ref 11.5–15.5)
WBC: 18.3 10*3/uL — ABNORMAL HIGH (ref 4.0–10.5)
nRBC: 0 % (ref 0.0–0.2)

## 2020-08-06 MED ORDER — ONDANSETRON HCL 4 MG PO TABS: 4.0000 mg | ORAL_TABLET | Freq: Three times a day (TID) | ORAL | 0 refills | Status: DC | PRN

## 2020-08-06 MED ORDER — ASPIRIN EC 325 MG PO TBEC: 325.0000 mg | DELAYED_RELEASE_TABLET | Freq: Two times a day (BID) | ORAL | 0 refills | Status: DC

## 2020-08-06 MED ORDER — SENNA-DOCUSATE SODIUM 8.6-50 MG PO TABS: 2.0000 | ORAL_TABLET | Freq: Every day | ORAL | 1 refills | Status: DC

## 2020-08-06 MED ORDER — OXYCODONE HCL 5 MG PO TABS: 5.0000 mg | ORAL_TABLET | ORAL | 0 refills | Status: DC | PRN

## 2020-08-06 NOTE — Progress Notes (Signed)
     Subjective: 1 Day Post-Op s/p Procedure(s): TOTAL KNEE ARTHROPLASTY   Patient is alert, oriented, yes  Patient reports pain as mild.   Denies chest pain, SOB, Calf pain. No nausea/vomiting. No other complaints.     Objective:  PE: VITALS:   Vitals:   08/05/20 2036 08/05/20 2231 08/06/20 0159 08/06/20 0631  BP: 130/68 (!) 115/39 (!) 111/51 (!) 113/57  Pulse: 77 76 76 62  Resp: 18 16 16 17   Temp: 98.7 F (37.1 C) 98.2 F (36.8 C) 97.9 F (36.6 C) 97.6 F (36.4 C)  TempSrc: Oral Oral Oral Oral  SpO2: 99% 97% 95% 96%  Weight:      Height:        ABD soft Sensation intact distally Intact pulses distally Dorsiflexion/Plantar flexion intact Incision: dressing C/D/I  LABS  Results for orders placed or performed during the hospital encounter of 08/05/20 (from the past 24 hour(s))  CBC     Status: Abnormal   Collection Time: 08/06/20  3:05 AM  Result Value Ref Range   WBC 18.3 (H) 4.0 - 10.5 K/uL   RBC 4.47 3.87 - 5.11 MIL/uL   Hemoglobin 13.0 12.0 - 15.0 g/dL   HCT 08/08/20 83.4 - 19.6 %   MCV 89.3 80.0 - 100.0 fL   MCH 29.1 26.0 - 34.0 pg   MCHC 32.6 30.0 - 36.0 g/dL   RDW 22.2 97.9 - 89.2 %   Platelets 184 150 - 400 K/uL   nRBC 0.0 0.0 - 0.2 %  Basic metabolic panel     Status: Abnormal   Collection Time: 08/06/20  3:05 AM  Result Value Ref Range   Sodium 138 135 - 145 mmol/L   Potassium 4.5 3.5 - 5.1 mmol/L   Chloride 106 98 - 111 mmol/L   CO2 26 22 - 32 mmol/L   Glucose, Bld 139 (H) 70 - 99 mg/dL   BUN 14 8 - 23 mg/dL   Creatinine, Ser 08/08/20 0.44 - 1.00 mg/dL   Calcium 8.8 (L) 8.9 - 10.3 mg/dL   GFR, Estimated 4.17 >40 mL/min   Anion gap 6 5 - 15    No results found.  Assessment/Plan: Principal Problem:   Osteoarthritis of left knee Active Problems:   S/P TKR (total knee replacement), left   1 Day Post-Op s/p Procedure(s): TOTAL KNEE ARTHROPLASTY  Weightbearing: WBAT LLE Insicional and dressing care: Reinforce dressings as needed Orthopedic  device(s): None VTE prophylaxis: Aspirin 325mg  BID  x 30 days Pain control: will discharge with oxycodone Follow - up plan: 2 weeks with Dr. >81 Dispo: pending PT eval, likely this afternoon if passes PT  Contact information:   Weekdays 8-5 Dion Saucier, PA-C [phone] A fter hours and holidays please check [email or web address] for group call information for [facility]  Janine Ores 08/06/2020, 6:57 AM

## 2020-08-06 NOTE — Discharge Instructions (Signed)

## 2020-08-06 NOTE — Plan of Care (Signed)
  Problem: Education: Goal: Knowledge of the prescribed therapeutic regimen will improve Outcome: Adequate for Discharge Goal: Individualized Educational Video(s) Outcome: Adequate for Discharge   Problem: Activity: Goal: Ability to avoid complications of mobility impairment will improve Outcome: Adequate for Discharge Goal: Range of joint motion will improve Outcome: Adequate for Discharge   Problem: Clinical Measurements: Goal: Postoperative complications will be avoided or minimized Outcome: Adequate for Discharge   Problem: Pain Management: Goal: Pain level will decrease with appropriate interventions Outcome: Adequate for Discharge   Problem: Skin Integrity: Goal: Will show signs of wound healing Outcome: Adequate for Discharge   Problem: Acute Rehab PT Goals(only PT should resolve) Goal: Pt Will Go Supine/Side To Sit Outcome: Adequate for Discharge Goal: Patient Will Transfer Sit To/From Stand Outcome: Adequate for Discharge Goal: Pt Will Ambulate Outcome: Adequate for Discharge Goal: Pt/caregiver will Perform Home Exercise Program Outcome: Adequate for Discharge

## 2020-08-06 NOTE — Progress Notes (Signed)
Physical Therapy Treatment Patient Details Name: Megan Nunez MRN: [id number] DOB: 1945-12-15 Today's Date: 08/06/2020    History of Present Illness 75 y.o. female admitted for L TKA on 08/05/20. PMH: R TKA 03/2018, anxiety, depression, OA.    PT Comments    Pt ambulated 130' with RW, no loss of balance. Stair training completed. Pt demonstrates good understanding of HEP. She is ready to DC home from a PT standpoint     Follow Up Recommendations  Follow surgeon's recommendation for DC plan and follow-up therapies     Equipment Recommendations  None recommended by PT    Recommendations for Other Services       Precautions / Restrictions Precautions Precautions: Knee Precaution Booklet Issued: Yes (comment) Precaution Comments: reviewed no pillow under knee Restrictions Weight Bearing Restrictions: No Other Position/Activity Restrictions: WBAT    Mobility  Bed Mobility Overal bed mobility: Modified Independent             General bed mobility comments: up in recliner    Transfers Overall transfer level: Needs assistance Equipment used: Rolling walker (2 wheeled) Transfers: Sit to/from Stand Sit to Stand: Supervision         General transfer comment: VCs hand placement  Ambulation/Gait Ambulation/Gait assistance: Modified independent (Device/Increase time) Gait Distance (Feet): 130 Feet Assistive device: Rolling walker (2 wheeled) Gait Pattern/deviations: Step-to pattern;Decreased step length - right;Decreased step length - left Gait velocity: decr   General Gait Details: steady, no loss of balance   Stairs Stairs: Yes   Stair Management: One rail Left;Forwards;With cane Number of Stairs: 3 General stair comments: VCs sequencing, 3 stairs x 2 trials.  Pt has 2 steps in interior of home with 1 handrail.   Wheelchair Mobility    Modified Rankin (Stroke Patients Only)       Balance Overall balance assessment: Modified Independent                                           Cognition Arousal/Alertness: Awake/alert Behavior During Therapy: WFL for tasks assessed/performed Overall Cognitive Status: Within Functional Limits for tasks assessed                                        Exercises Total Joint Exercises Ankle Circles/Pumps: AROM;Both;10 reps;Supine Quad Sets: AROM;Left;5 reps;Supine Short Arc Quad: AROM;Left;5 reps;Supine Heel Slides: AAROM;Left;5 reps;Supine Hip ABduction/ADduction: AAROM;Left;5 reps;Supine Straight Leg Raises: AROM;Left;5 reps;Supine Long Arc Quad: AROM;Left;5 reps;Seated Knee Flexion: AAROM;Left;10 reps;Seated Goniometric ROM: ~0-60* AAROM L knee    General Comments        Pertinent Vitals/Pain Pain Assessment: 0-10 Pain Score: 4  Pain Location: L knee Pain Descriptors / Indicators: Sore Pain Intervention(s): Limited activity within patient's tolerance;Monitored during session;Ice applied;Patient requesting pain meds-RN notified    Home Living Family/patient expects to be discharged to:: Private residence Living Arrangements: Spouse/significant other Available Help at Discharge: Family;Available 24 hours/day Type of Home: House Home Access: Level entry   Home Layout: Two level Home Equipment: Shower seat;Bedside commode;Toilet riser;Walker - 2 wheels;Cane - single point      Prior Function Level of Independence: Independent          PT Goals (current goals can now be found in the care plan section) Acute Rehab PT Goals Patient Stated Goal: hike and dance  PT Goal Formulation: With patient Time For Goal Achievement: 08/13/20 Potential to Achieve Goals: Good Progress towards PT goals: Progressing toward goals    Frequency    7X/week      PT Plan Current plan remains appropriate    Co-evaluation              AM-PAC PT "6 Clicks" Mobility   Outcome Measure  Help needed turning from your back to your side while in a flat bed  without using bedrails?: A Little Help needed moving from lying on your back to sitting on the side of a flat bed without using bedrails?: A Little Help needed moving to and from a bed to a chair (including a wheelchair)?: None Help needed standing up from a chair using your arms (e.g., wheelchair or bedside chair)?: None Help needed to walk in hospital room?: None Help needed climbing 3-5 steps with a railing? : A Little 6 Click Score: 21    End of Session Equipment Utilized During Treatment: Gait belt Activity Tolerance: Patient tolerated treatment well Patient left: in chair;with call bell/phone within reach;with chair alarm set Nurse Communication: Mobility status;Patient requests pain meds PT Visit Diagnosis: Pain;Difficulty in walking, not elsewhere classified (R26.2);Muscle weakness (generalized) (M62.81) Pain - Right/Left: Left Pain - part of body: Knee     Time: 6063-0160 PT Time Calculation (min) (ACUTE ONLY): 41 min  Charges:  $Gait Training: 8-22 mins $Therapeutic Exercise: 8-22 mins $Therapeutic Activity: 8-22 mins                     Ralene Bathe Kistler PT 08/06/2020  Acute Rehabilitation Services Pager [phone] Office [phone]

## 2020-08-06 NOTE — TOC Transition Note (Signed)
Transition of Care [facility]) - CM/SW Discharge Note   Patient Details  Name: Megan Nunez MRN: [id number] Date of Birth: 07-21-1945  Transition of Care [facility]) CM/SW Contact:  Lennart Pall, LCSW Phone Number: 08/06/2020, 10:56 AM   Clinical Narrative:    Met briefly with pt and confirming she has all needed DME at home.  Plan for [facility] per [place] - arranged prior to surgery.  No TOC needs.   Final next level of care: [place] Barriers to Discharge: No Barriers Identified   Patient Goals and CMS Choice Patient states their goals for this hospitalization and ongoing recovery are:: return home      Discharge Placement                       Discharge Plan and Services                DME Arranged: N/A DME Agency: NA       HH Arranged: PT [place] Agency: [place]        Social Determinants of Health (SDOH) Interventions     Readmission Risk Interventions No flowsheet data found.

## 2020-08-06 NOTE — Plan of Care (Signed)

## 2020-08-06 NOTE — Discharge Summary (Signed)
Discharge Summary  Patient ID: KMYA PLACIDE MRN: [id number] DOB/AGE: Jun 09, 1945 75 y.o.  Admit date: 08/05/2020 Discharge date: 08/06/2020  Admission Diagnoses:  Osteoarthritis of left knee  Discharge Diagnoses:  Principal Problem:   Osteoarthritis of left knee Active Problems:   S/P TKR (total knee replacement), left   Past Medical History:  Diagnosis Date   Anxiety    Arthritis    Depression    Family history of adverse reaction to anesthesia 2012   Mother has cardiac  arrest after surgery   Hepatitis    high antibodies for hepatitis   Obesity     Surgeries: Procedure(s): TOTAL KNEE ARTHROPLASTY on 08/05/2020   Consultants (if any):   Discharged Condition: Improved  Hospital Course: Megan Nunez is an 75 y.o. female who was admitted 08/05/2020 with a diagnosis of Osteoarthritis of left knee and went to the operating room on 08/05/2020 and underwent the above named procedures.    She was given perioperative antibiotics:  Anti-infectives (From admission, onward)    Start     Dose/Rate Route Frequency Ordered Stop   08/05/20 2200  ceFAZolin (ANCEF) 2 g in sodium chloride 0.9 % 100 mL IVPB        2 g 200 mL/hr over 30 Minutes Intravenous Every 6 hours 08/05/20 2050 08/06/20 0441   08/05/20 1600  ceFAZolin (ANCEF) IVPB 1 g/50 mL premix        1 g 100 mL/hr over 30 Minutes Intravenous  Once 08/05/20 1546 08/05/20 1624   08/05/20 1550  ceFAZolin (ANCEF) 1-4 GM/50ML-% IVPB       Note to Pharmacy: Jodi Marble   : cabinet override      08/05/20 1550 08/05/20 1554   08/05/20 1100  ceFAZolin (ANCEF) 2 g in sodium chloride 0.9 % 100 mL IVPB        2 g 200 mL/hr over 30 Minutes Intravenous On call 08/05/20 1056 08/05/20 1520     .  She was given sequential compression devices, early ambulation, and aspirin for DVT prophylaxis.  She benefited maximally from the hospital stay and there were no complications.    Recent vital signs:  Vitals:   08/06/20 0631 08/06/20  1024  BP: (!) 113/57 125/64  Pulse: 62 72  Resp: 17 16  Temp: 97.6 F (36.4 C) 98.1 F (36.7 C)  SpO2: 96% 100%    Recent laboratory studies:  Lab Results  Component Value Date   HGB 13.0 08/06/2020   HGB 14.7 07/18/2020   HGB 11.0 (L) 04/16/2020   Lab Results  Component Value Date   WBC 18.3 (H) 08/06/2020   PLT 184 08/06/2020   No results found for: INR Lab Results  Component Value Date   NA 138 08/06/2020   K 4.5 08/06/2020   CL 106 08/06/2020   CO2 26 08/06/2020   BUN 14 08/06/2020   CREATININE 0.60 08/06/2020   GLUCOSE 139 (H) 08/06/2020    Discharge Medications:   Allergies as of 08/06/2020       Reactions   Nickel Itching, Rash        Medication List     STOP taking these medications    B-complex with vitamin C tablet   Biotin 32202 MCG Tabs   cephALEXin 500 MG capsule Commonly known as: KEFLEX   ibuprofen 200 MG tablet Commonly known as: ADVIL   lisdexamfetamine 40 MG capsule Commonly known as: Vyvanse   multivitamin with minerals tablet   vitamin B-12 1000 MCG tablet  Commonly known as: CYANOCOBALAMIN   vitamin C 1000 MG tablet       TAKE these medications    aspirin EC 325 MG tablet Take 1 tablet (325 mg total) by mouth 2 (two) times daily.   buPROPion 300 MG 24 hr tablet Commonly known as: WELLBUTRIN XL Take 1 tablet (300 mg total) by mouth daily.   DULoxetine 30 MG capsule Commonly known as: CYMBALTA TAKE ONE CAPSULE BY MOUTH DAILY TAKE WITH 60 MG   DULoxetine 60 MG capsule Commonly known as: CYMBALTA Take 1 capsule (60 mg total) by mouth every morning. Take with a 30 mg capsule to equal total dose of 90 mg   Ginkgo Biloba 120 MG Tabs Take 120 mg by mouth daily.   Glucosamine-Chondroitin 750-600 MG Chew Chew 2 tablets by mouth daily.   ondansetron 4 MG tablet Commonly known as: Zofran Take 1 tablet (4 mg total) by mouth every 8 (eight) hours as needed for nausea or vomiting.   OVER THE COUNTER  MEDICATION Apply 1 application topically daily as needed (knee pain). CBD Cream   oxyCODONE 5 MG immediate release tablet Commonly known as: Roxicodone Take 1 tablet (5 mg total) by mouth every 4 (four) hours as needed for severe pain.   polyvinyl alcohol 1.4 % ophthalmic solution Commonly known as: LIQUIFILM TEARS Place 1 drop into both eyes as needed for dry eyes.   sennosides-docusate sodium 8.6-50 MG tablet Commonly known as: SENOKOT-S Take 2 tablets by mouth daily.   Vitamin D 50 MCG (2000 UT) tablet Take 2,000 Units by mouth daily.        Diagnostic Studies: No results found.  Disposition: Discharge disposition: 01-Home or Self Care          Follow-up Information     Teryl Lucy, MD. Go on 08/18/2020.   Specialty: Orthopedic Surgery Why: Your appointment is scheudled for 11:30. Contact information: [address] [place] [address] [phone]         [facility] Follow up.   Specialty: Home Health Services Why: HHPT will provide 5 visits prior to starting Outpatient physical therapy Contact information: [address] [place] [address] [phone]         [facility]. Go on 08/19/2020.   Why: Your appointment is scheduled for 11:00. Please arrive a few minutes early to complete your paperwork. Contact information: [phone]                 Signed: Armida Sans PA-C 08/06/2020, 1:31 PM

## 2020-08-06 NOTE — Evaluation (Signed)
Physical Therapy Evaluation Patient Details Name: Megan Nunez MRN: [id number] DOB: [birth year] Today's Date: 08/06/2020   History of Present Illness  75 y.o. female admitted for L TKA on 08/05/20. PMH: R TKA 03/2018, anxiety, depression, OA.  Clinical Impression  Pt is s/p TKA resulting in the deficits listed below (see PT Problem List). Pt ambulated 150' with RW, no loss of balance. Instructed pt in TKA HEP, she demonstrates good understanding. Will plan to do stair training this afternoon after which I expect she'll be ready to DC home.  Pt will benefit from skilled PT to increase their independence and safety with mobility to allow discharge to the venue listed below.      Follow Up Recommendations Follow surgeon's recommendation for DC plan and follow-up therapies    Equipment Recommendations  None recommended by PT    Recommendations for Other Services       Precautions / Restrictions Precautions Precautions: Knee Precaution Booklet Issued: Yes (comment) Precaution Comments: reviewed no pillow under knee Restrictions Weight Bearing Restrictions: No Other Position/Activity Restrictions: WBAT      Mobility  Bed Mobility Overal bed mobility: Modified Independent             General bed mobility comments: used bedrail, HOB up    Transfers Overall transfer level: Needs assistance Equipment used: Rolling walker (2 wheeled) Transfers: Sit to/from Stand Sit to Stand: Min guard         General transfer comment: VCs hand placement, min guard for safety  Ambulation/Gait Ambulation/Gait assistance: Supervision Gait Distance (Feet): 150 Feet Assistive device: Rolling walker (2 wheeled) Gait Pattern/deviations: Step-to pattern;Decreased step length - right;Decreased step length - left Gait velocity: decr   General Gait Details: steady, no loss of balance, VCs for sequencing initially  Stairs            Wheelchair Mobility    Modified Rankin (Stroke  Patients Only)       Balance Overall balance assessment: Modified Independent                                           Pertinent Vitals/Pain Pain Assessment: 0-10 Pain Score: 5  Pain Location: L knee Pain Descriptors / Indicators: Sore Pain Intervention(s): Limited activity within patient's tolerance;Monitored during session;Premedicated before session;Ice applied    Home Living Family/patient expects to be discharged to:: Private residence Living Arrangements: Spouse/significant other Available Help at Discharge: Family;Available 24 hours/day Type of Home: House Home Access: Level entry     Home Layout: Two level Home Equipment: Shower seat;Bedside commode;Toilet riser;Walker - 2 wheels;Cane - single point      Prior Function Level of Independence: Independent               Hand Dominance        Extremity/Trunk Assessment   Upper Extremity Assessment Upper Extremity Assessment: RUE deficits/detail;Overall WFL for tasks assessed RUE Deficits / Details: can actively elevate shoulder to ~100* AROM    Lower Extremity Assessment Lower Extremity Assessment: LLE deficits/detail LLE Deficits / Details: SLR 3/5, knee AAROM ~0-60* LLE Sensation: WNL LLE Coordination: WNL    Cervical / Trunk Assessment Cervical / Trunk Assessment: Normal  Communication   Communication: No difficulties  Cognition Arousal/Alertness: Awake/alert Behavior During Therapy: WFL for tasks assessed/performed Overall Cognitive Status: Within Functional Limits for tasks assessed  General Comments      Exercises Total Joint Exercises Ankle Circles/Pumps: AROM;Both;10 reps;Supine Quad Sets: AROM;Left;5 reps;Supine Short Arc Quad: AROM;Left;5 reps;Supine Heel Slides: AAROM;Left;5 reps;Supine Hip ABduction/ADduction: AAROM;Left;5 reps;Supine Straight Leg Raises: AROM;Left;5 reps;Supine Long Arc Quad:  AROM;Left;5 reps;Seated Knee Flexion: AAROM;Left;10 reps;Seated Goniometric ROM: ~0-60* AAROM L knee   Assessment/Plan    PT Assessment Patient needs continued PT services  PT Problem List Decreased range of motion;Decreased strength;Decreased activity tolerance;Decreased mobility;Pain       PT Treatment Interventions DME instruction;Gait training;Functional mobility training;Therapeutic exercise;Therapeutic activities;Patient/family education    PT Goals (Current goals can be found in the Care Plan section)  Acute Rehab PT Goals Patient Stated Goal: hike and dance PT Goal Formulation: With patient Time For Goal Achievement: 08/13/20 Potential to Achieve Goals: Good    Frequency 7X/week   Barriers to discharge        Co-evaluation               AM-PAC PT "6 Clicks" Mobility  Outcome Measure Help needed turning from your back to your side while in a flat bed without using bedrails?: A Little Help needed moving from lying on your back to sitting on the side of a flat bed without using bedrails?: A Little Help needed moving to and from a bed to a chair (including a wheelchair)?: A Little Help needed standing up from a chair using your arms (e.g., wheelchair or bedside chair)?: A Little Help needed to walk in hospital room?: A Little Help needed climbing 3-5 steps with a railing? : A Little 6 Click Score: 18    End of Session Equipment Utilized During Treatment: Gait belt Activity Tolerance: Patient tolerated treatment well Patient left: in chair;with call bell/phone within reach;with chair alarm set Nurse Communication: Mobility status PT Visit Diagnosis: Pain;Difficulty in walking, not elsewhere classified (R26.2);Muscle weakness (generalized) (M62.81) Pain - Right/Left: Left Pain - part of body: Knee    Time: 7262-0355 PT Time Calculation (min) (ACUTE ONLY): 26 min   Charges:   PT Evaluation $PT Eval Moderate Complexity: 1 Mod PT Treatments $Gait Training:  8-22 mins       Ralene Bathe Kistler PT 08/06/2020  Acute Rehabilitation Services Pager [phone] Office [phone]

## 2020-08-07 DIAGNOSIS — M1712 Unilateral primary osteoarthritis, left knee: Secondary | ICD-10-CM | POA: Diagnosis not present

## 2020-08-09 DIAGNOSIS — F32A Depression, unspecified: Secondary | ICD-10-CM | POA: Diagnosis not present

## 2020-08-09 DIAGNOSIS — I10 Essential (primary) hypertension: Secondary | ICD-10-CM | POA: Diagnosis not present

## 2020-08-09 DIAGNOSIS — K759 Inflammatory liver disease, unspecified: Secondary | ICD-10-CM | POA: Diagnosis not present

## 2020-08-09 DIAGNOSIS — Z96652 Presence of left artificial knee joint: Secondary | ICD-10-CM | POA: Diagnosis not present

## 2020-08-09 DIAGNOSIS — Z471 Aftercare following joint replacement surgery: Secondary | ICD-10-CM | POA: Diagnosis not present

## 2020-08-12 ENCOUNTER — Ambulatory Visit: Payer: Medicare Other | Admitting: Physical Therapy

## 2020-08-12 DIAGNOSIS — Z471 Aftercare following joint replacement surgery: Secondary | ICD-10-CM | POA: Diagnosis not present

## 2020-08-12 DIAGNOSIS — F32A Depression, unspecified: Secondary | ICD-10-CM | POA: Diagnosis not present

## 2020-08-12 DIAGNOSIS — Z96652 Presence of left artificial knee joint: Secondary | ICD-10-CM | POA: Diagnosis not present

## 2020-08-12 DIAGNOSIS — K759 Inflammatory liver disease, unspecified: Secondary | ICD-10-CM | POA: Diagnosis not present

## 2020-08-12 DIAGNOSIS — I10 Essential (primary) hypertension: Secondary | ICD-10-CM | POA: Diagnosis not present

## 2020-08-14 DIAGNOSIS — Z96652 Presence of left artificial knee joint: Secondary | ICD-10-CM | POA: Diagnosis not present

## 2020-08-14 DIAGNOSIS — F32A Depression, unspecified: Secondary | ICD-10-CM | POA: Diagnosis not present

## 2020-08-14 DIAGNOSIS — K759 Inflammatory liver disease, unspecified: Secondary | ICD-10-CM | POA: Diagnosis not present

## 2020-08-14 DIAGNOSIS — Z471 Aftercare following joint replacement surgery: Secondary | ICD-10-CM | POA: Diagnosis not present

## 2020-08-14 DIAGNOSIS — I10 Essential (primary) hypertension: Secondary | ICD-10-CM | POA: Diagnosis not present

## 2020-08-15 DIAGNOSIS — I10 Essential (primary) hypertension: Secondary | ICD-10-CM | POA: Diagnosis not present

## 2020-08-15 DIAGNOSIS — Z96652 Presence of left artificial knee joint: Secondary | ICD-10-CM | POA: Diagnosis not present

## 2020-08-15 DIAGNOSIS — F32A Depression, unspecified: Secondary | ICD-10-CM | POA: Diagnosis not present

## 2020-08-15 DIAGNOSIS — K759 Inflammatory liver disease, unspecified: Secondary | ICD-10-CM | POA: Diagnosis not present

## 2020-08-15 DIAGNOSIS — Z471 Aftercare following joint replacement surgery: Secondary | ICD-10-CM | POA: Diagnosis not present

## 2020-08-18 ENCOUNTER — Other Ambulatory Visit (HOSPITAL_COMMUNITY): Payer: Medicare Other

## 2020-08-18 DIAGNOSIS — M25512 Pain in left shoulder: Secondary | ICD-10-CM | POA: Diagnosis not present

## 2020-08-18 DIAGNOSIS — M25511 Pain in right shoulder: Secondary | ICD-10-CM | POA: Diagnosis not present

## 2020-08-18 DIAGNOSIS — Z96652 Presence of left artificial knee joint: Secondary | ICD-10-CM | POA: Diagnosis not present

## 2020-08-18 DIAGNOSIS — F32A Depression, unspecified: Secondary | ICD-10-CM | POA: Diagnosis not present

## 2020-08-18 DIAGNOSIS — K759 Inflammatory liver disease, unspecified: Secondary | ICD-10-CM | POA: Diagnosis not present

## 2020-08-18 DIAGNOSIS — I10 Essential (primary) hypertension: Secondary | ICD-10-CM | POA: Diagnosis not present

## 2020-08-18 DIAGNOSIS — Z471 Aftercare following joint replacement surgery: Secondary | ICD-10-CM | POA: Diagnosis not present

## 2020-08-19 ENCOUNTER — Ambulatory Visit (HOSPITAL_BASED_OUTPATIENT_CLINIC_OR_DEPARTMENT_OTHER): Payer: Medicare Other | Attending: Orthopedic Surgery | Admitting: Physical Therapy

## 2020-08-19 ENCOUNTER — Encounter (HOSPITAL_BASED_OUTPATIENT_CLINIC_OR_DEPARTMENT_OTHER): Payer: Self-pay | Admitting: Physical Therapy

## 2020-08-19 ENCOUNTER — Other Ambulatory Visit: Payer: Self-pay

## 2020-08-19 DIAGNOSIS — M25562 Pain in left knee: Secondary | ICD-10-CM | POA: Insufficient documentation

## 2020-08-19 DIAGNOSIS — R262 Difficulty in walking, not elsewhere classified: Secondary | ICD-10-CM | POA: Insufficient documentation

## 2020-08-19 DIAGNOSIS — M25662 Stiffness of left knee, not elsewhere classified: Secondary | ICD-10-CM | POA: Insufficient documentation

## 2020-08-19 DIAGNOSIS — M6281 Muscle weakness (generalized): Secondary | ICD-10-CM | POA: Insufficient documentation

## 2020-08-19 NOTE — Patient Instructions (Signed)
Access Code: [email or web address] URL: [email or web address] Date: 08/19/2020 Prepared by: Lorayne Bender  Exercises Supine Heel Slide with Strap - 2 x daily - 7 x weekly - 3 sets - 10 reps Supine Knee Extension Stretch on Towel Roll - 2 x daily - 7 x weekly - 3 sets - 10 reps Supine Quad Set - 1 x daily - 7 x weekly - 3 sets - 10 reps Small Range Straight Leg Raise - 1 x daily - 7 x weekly - 3 sets - 10 reps Standing March with Counter Support - 1 x daily - 7 x weekly - 3 sets - 10 reps Heel Raises with Counter Support - 1 x daily - 7 x weekly - 3 sets - 10 reps

## 2020-08-20 ENCOUNTER — Encounter (HOSPITAL_BASED_OUTPATIENT_CLINIC_OR_DEPARTMENT_OTHER): Payer: Self-pay | Admitting: Physical Therapy

## 2020-08-20 NOTE — Therapy (Signed)
[facility] [address] [place], [place], [address] Phone: [phone]   Fax:  [phone]  Physical Therapy Evaluation  Patient Details  Name: Megan Nunez MRN: [id number] Date of Birth: [birth year] Referring Provider (PT): Teryl Lucy, MD   Encounter Date: 08/19/2020   PT End of Session - 08/19/20 1510     Visit Number 2    Number of Visits 16    Date for PT Re-Evaluation 10/15/20    Authorization Type [employer] Medicare    PT Start Time 1354    PT Stop Time 1436    PT Time Calculation (min) 42 min    Activity Tolerance Patient tolerated treatment well    Behavior During Therapy [facility] for tasks assessed/performed             Past Medical History:  Diagnosis Date   Anxiety    Arthritis    Depression    Family history of adverse reaction to anesthesia 2012   Mother has cardiac  arrest after surgery   Hepatitis    high antibodies for hepatitis   Obesity     Past Surgical History:  Procedure Laterality Date   LAPAROSCOPIC TUBAL LIGATION  1985   TOTAL KNEE ARTHROPLASTY Right 04/15/2020   Procedure: TOTAL KNEE ARTHROPLASTY;  Surgeon: Teryl Lucy, MD;  Location: [facility];  Service: Orthopedics;  Laterality: Right;   TOTAL KNEE ARTHROPLASTY Left 08/05/2020   Procedure: TOTAL KNEE ARTHROPLASTY;  Surgeon: Teryl Lucy, MD;  Location: [facility];  Service: Orthopedics;  Laterality: Left;   WISDOM TOOTH EXTRACTION     age 89    There were no vitals filed for this visit.    Subjective Assessment - 08/19/20 1357     Subjective Patient reports to physical therapy after total left knee athroplasty. Patient states that she is feeling strong with her knee and has been able to complete her home health physical therapy exercises with out too much pain or discomfort. Patient reports that she still has trouble with flexion and extension at end ranges but reports no major issues sitting or sit to stand. Patient reports she wants to  continue therapy to improve flexion and strengthen her knee to stay consistent with her HEP.    Patient is accompained by: Family member    Pertinent History right total knee in the past, anxiety, depression    Limitations Standing;Walking;Lifting    How long can you sit comfortably? n/a    How long can you stand comfortably? 10-15 mins    How long can you walk comfortably? limited distance,    Patient Stated Goals improve strenght, endurance,    Currently in Pain? Yes    Pain Score 5     Pain Location Knee    Pain Orientation Left    Pain Descriptors / Indicators Aching    Pain Type Surgical pain    Pain Radiating Towards n/a    Pain Onset 1 to 4 weeks ago    Pain Frequency Intermittent    Aggravating Factors  prolonged walking and standing    Pain Relieving Factors rest, medication    Effect of Pain on Daily Activities difficulty walking long distance    Multiple Pain Sites No                [facility] PT Assessment - 08/20/20 0001       Assessment   Medical Diagnosis Left knee pain    Referring Provider (PT) Teryl Lucy, MD  Onset Date/Surgical Date 08/05/20    Next MD Visit 4 weeks    Prior Therapy Yes for right knee.      Precautions   Precautions None      Restrictions   Weight Bearing Restrictions No      Balance Screen   Has the patient fallen in the past 6 months No    Has the patient had a decrease in activity level because of a fear of falling?  No    Is the patient reluctant to leave their home because of a fear of falling?  No      Home Tourist information centre manager residence    Living Arrangements Spouse/significant other    Available Help at Discharge Family    Type of Home House    Home Access Level entry    Home Layout Multi-level    Alternate Level Stairs-Number of Steps 2    Alternate Level Stairs-Rails Left   going down   Home Equipment Walker - 2 wheels    Additional Comments has steps into her den      Prior Function    Level of Independence Independent    Vocation Retired    Leisure go for long walk, long shopping trips, dance      Cognition   Overall Cognitive Status Within Functional Limits for tasks assessed    Attention Focused    Focused Attention Appears intact    Memory Appears intact    Awareness Appears intact    Problem Solving Appears intact      Observation/Other Assessments   Observations Knee incisions healing, swelling and redness event      Observation/Other Assessments-Edema    Edema Circumferential      Circumferential Edema   Circumferential - Right 37    Circumferential - Left  40 1/2      Sensation   Light Touch Appears Intact      Coordination   Gross Motor Movements are Fluid and Coordinated Yes    Fine Motor Movements are Fluid and Coordinated Yes      Functional Tests   Functional tests Sit to Stand      Sit to Stand   Comments 5x STS 17 sec without use of hands      Posture/Postural Control   Posture Comments forward head, rounded shoulders      AROM   Right Knee Extension 0    Right Knee Flexion 110    Left Knee Extension -8    Left Knee Flexion 96      Strength   Right Hip Flexion 5/5    Right Hip Extension 5/5    Right Hip ABduction 5/5    Left Hip Flexion 4/5    Left Hip ABduction 4+/5    Left Hip ADduction 4+/5    Right Knee Flexion 5/5    Right Knee Extension 5/5    Left Knee Flexion 4/5    Left Knee Extension 4/5      Palpation   Palpation comment tender to palpation on joint line, medial and lateral knee but tolerable.      Transfers   Transfers Sit to Stand;Stand to Sit    Sit to Stand 7: Independent    Stand to Sit 7: Independent      Ambulation/Gait   Gait Comments patient has correct gait mechanics, slight forward walking but overall no major abnormalities.  Objective measurements completed on examination: See above findings.       [facility] Adult PT Treatment/Exercise - 08/20/20 0001        Knee/Hip Exercises: Standing   Other Standing Knee Exercises slow march 2x15, weight shifts 2x10 forward and backward      Knee/Hip Exercises: Supine   Quad Sets Strengthening;Left;2 sets;10 reps    Heel Slides AROM;Left;2 sets;10 reps    Straight Leg Raises Limitations 2x10    Other Supine Knee/Hip Exercises self knee stretch with strap 5x10 sec hold                    PT Education - 08/19/20 1509     Education Details Review HEP, symptom mangement (ice for swelling, elevation).    Person(s) Educated Patient    Methods Explanation;Demonstration;Verbal cues    Comprehension Verbalized understanding;Returned demonstration;Verbal cues required              PT Short Term Goals - 08/19/20 1704       PT SHORT TERM GOAL #1   Title Patient will report decrease pain to less than 3/10.    Baseline 5/10    Time 4    Period Weeks    Status New    Target Date 09/17/20      PT SHORT TERM GOAL #2   Title Patient will improve hip flexion strength to 4+/5 in left knee in order to increase walking distance.    Baseline 4/5    Time 4    Period Weeks    Status New    Target Date 09/17/20      PT SHORT TERM GOAL #3   Title Patient will demonstrate 110 degrees on the left    Baseline 110    Time 4    Period Weeks    Status New    Target Date 09/17/20               PT Long Term Goals - 08/19/20 1708       PT LONG TERM GOAL #1   Title Patient will improve 5x sit to stand to under 13 seconds in order to decrease risk of falls during ambulation.    Baseline 17 secs    Time 8    Period Weeks    Status New    Target Date 10/15/20      PT LONG TERM GOAL #2   Title Patient will show 5/5 strength in left knee in order to ambulate around community safely.    Baseline 4/5 knee flexion/extension, 4/5 hip flexion.    Time 8    Period Weeks    Status New    Target Date 10/15/20      PT LONG TERM GOAL #3   Title Patient will be able have active range of  motion in left and knee atleast 95% equal in order to safely perform ADL's with appropriate mechanics.    Baseline LExt: -8 LFlex: 96 RExt: 0  RFlex: 110    Time 8    Period Weeks    Status New    Target Date 10/15/20      PT LONG TERM GOAL #4   Title Pt will be able to tolerate standing at least 30 minutes for improved home and community mobility.    Baseline Limited 5-10 mins at moment.    Time 4    Period Weeks    Status New    Target Date 09/16/20  Plan - 08/19/20 1554     Clinical Impression Statement Patient presents with left knee pain after total knee athroplasty on July/19/22. Patient has swelling on left knee with surgical wound healing accordinely. Patient presents with decrease strength on left lower extremity compared to right. Patient is walking without a walker at slower speed.  Patient 5x times sit to stand was 17 seconds. Overall, patients presents with expected limitations after total left knee athroplasty. Patient would benefit from skilled therapy to improve strength, balance, ROM, and endurance in left knee.    Personal Factors and Comorbidities Age;Fitness;Past/Current Experience;Time since onset of injury/illness/exacerbation    Examination-Activity Limitations Transfers;Locomotion Level;Stairs;Stand;Hygiene/Grooming    Examination-Participation Restrictions Community Activity;Driving;Cleaning    Stability/Clinical Decision Making Stable/Uncomplicated    Clinical Decision Making Low    Rehab Potential Excellent    PT Frequency 2x / week    PT Duration 8 weeks    PT Treatment/Interventions ADLs/Self Care Home Management;Therapeutic exercise;Aquatic Therapy;Cryotherapy;Iontophoresis 4mg /ml Dexamethasone;Moist Heat;Ultrasound;DME Instruction;Electrical Stimulation;Gait training;Stair training;Functional mobility training;Therapeutic activities;Balance training;Neuromuscular re-education;Manual techniques;Patient/family education;Orthotic  Fit/Training;Scar mobilization;Dry needling;Energy conservation;Taping;Vasopneumatic Device    PT Next Visit Plan Review HEP, begin to incorporate hip strengthening exercises such as clamshells, bridges as tolerated, manuel therapy to assist in improving flexion and extension, joint mobs. Vaso for swelling if needed. Include balance exercises in weight bearing positions.    PT Home Exercise Plan Access Code: [email or web address]  URL: [email or web address]  Date: 08/19/2020  Prepared by: 10/19/2020    Exercises  Supine Heel Slide with Strap - 2 x daily - 7 x weekly - 3 sets - 10 reps  Supine Knee Extension Stretch on Towel Roll - 2 x daily - 7 x weekly - 3 sets - 10 reps  Supine Quad Set - 1 x daily - 7 x weekly - 3 sets - 10 reps  Small Range Straight Leg Raise - 1 x daily - 7 x weekly - 3 sets - 10 reps  Standing March with Counter Support - 1 x daily - 7 x weekly - 3 sets - 10 reps  Heel Raises with Counter Support - 1 x daily - 7 x weekly - 3 sets - 10 reps    Consulted and Agree with Plan of Care Patient    Family Member Consulted Husband             Patient will benefit from skilled therapeutic intervention in order to improve the following deficits and impairments:  Abnormal gait, Increased fascial restricitons, Pain, Decreased mobility, Decreased activity tolerance, Decreased range of motion, Decreased strength, Hypomobility, Decreased balance, Difficulty walking, Increased edema, Decreased endurance, Impaired flexibility, Decreased scar mobility  Visit Diagnosis: Acute pain of left knee  Stiffness of left knee, not elsewhere classified     Problem List Patient Active Problem List   Diagnosis Date Noted   S/P TKR (total knee replacement), left 08/05/2020   Skin infection 05/28/2020   Wound of skin 05/28/2020   S/P knee surgery 05/28/2020   Eczematous dermatitis of upper eyelids of both eyes 05/28/2020   Osteoarthritis of left knee 05/27/2020   S/P total knee replacement,  right 04/15/2020   Osteopenia 04/10/2020   Major depressive disorder, recurrent episode, mild (HCC) 10/31/2017   ADHD, predominantly inattentive type 10/31/2017   Primary osteoarthritis of right knee 09/09/2016   Primary osteoarthritis, right shoulder 09/09/2016   Arthritis 07/25/2012   Tinnitus 07/25/2012   Mixed incontinence urge and stress 07/25/2012    09/25/2012 PT DPT  08/20/2020, 1:01 PM  [facility] [address] [place], [place], [address] Phone: [phone]   Fax:  [phone]  Name: Megan Nunez MRN: [id number] Date of Birth: 1945-07-06

## 2020-08-25 ENCOUNTER — Other Ambulatory Visit: Payer: Self-pay

## 2020-08-25 ENCOUNTER — Encounter (HOSPITAL_BASED_OUTPATIENT_CLINIC_OR_DEPARTMENT_OTHER): Payer: Self-pay | Admitting: Physical Therapy

## 2020-08-25 ENCOUNTER — Ambulatory Visit (HOSPITAL_BASED_OUTPATIENT_CLINIC_OR_DEPARTMENT_OTHER): Payer: Medicare Other | Admitting: Physical Therapy

## 2020-08-25 DIAGNOSIS — M25662 Stiffness of left knee, not elsewhere classified: Secondary | ICD-10-CM | POA: Diagnosis not present

## 2020-08-25 DIAGNOSIS — M6281 Muscle weakness (generalized): Secondary | ICD-10-CM | POA: Diagnosis not present

## 2020-08-25 DIAGNOSIS — M25562 Pain in left knee: Secondary | ICD-10-CM

## 2020-08-25 DIAGNOSIS — R262 Difficulty in walking, not elsewhere classified: Secondary | ICD-10-CM

## 2020-08-25 NOTE — Therapy (Signed)
[facility] [address] [place], [place], [address] Phone: [phone]   Fax:  [phone]  Physical Therapy Treatment  Patient Details  Name: Megan SloopKay E Lessig MRN: [id number] Date of Birth: 12/06/1945 Referring Provider (PT): Teryl LucyLandau, Joshua, MD   Encounter Date: 08/25/2020   PT End of Session - 08/25/20 1656     Visit Number 3    Number of Visits 16    Date for PT Re-Evaluation 10/15/20    Authorization Type U[employer] Medicare    PT Start Time 1650    PT Stop Time 1730    PT Time Calculation (min) 40 min    Activity Tolerance Patient tolerated treatment well    Behavior During Therapy [facility] for tasks assessed/performed             Past Medical History:  Diagnosis Date   Anxiety    Arthritis    Depression    Family history of adverse reaction to anesthesia 2012   Mother has cardiac  arrest after surgery   Hepatitis    high antibodies for hepatitis   Obesity     Past Surgical History:  Procedure Laterality Date   LAPAROSCOPIC TUBAL LIGATION  1985   TOTAL KNEE ARTHROPLASTY Right 04/15/2020   Procedure: TOTAL KNEE ARTHROPLASTY;  Surgeon: Teryl LucyLandau, Joshua, MD;  Location: [facility];  Service: Orthopedics;  Laterality: Right;   TOTAL KNEE ARTHROPLASTY Left 08/05/2020   Procedure: TOTAL KNEE ARTHROPLASTY;  Surgeon: Teryl LucyLandau, Joshua, MD;  Location: [facility];  Service: Orthopedics;  Laterality: Left;   WISDOM TOOTH EXTRACTION     age 75    There were no vitals filed for this visit.   Subjective Assessment - 08/25/20 1653     Subjective Pt states the knee is doing well. She states that there was some stiffness/sorness at the end of the day yesterday. She states the bending and straightening are going well. She has been icing intermittently.    Patient is accompained by: Family member    Pertinent History right total knee in the past, anxiety, depression    Limitations Standing;Walking;Lifting    How long can you sit comfortably? n/a     How long can you stand comfortably? 10-15 mins    How long can you walk comfortably? limited distance,    Patient Stated Goals improve strenght, endurance,    Currently in Pain? No/denies    Pain Score 0-No pain    Pain Location Knee    Pain Orientation Left    Pain Onset 1 to 4 weeks ago                [facility] - 08/25/20 0001       AROM   Left Knee Extension 0    Left Knee Flexion 110                           [facility] Adult PT Treatment/Exercise - 08/25/20 0001       Transfers   Transfers Sit to Stand;Stand to Sit    Sit to Stand 7: Independent                            Knee/Hip Exercises: Standing   Other Standing Knee Exercises heel toe rocking 20x, emphasis on heel strike and toe off      Knee/Hip Exercises: Seated   Long Arc Quad Limitations 20x  Knee/Hip Exercises: Supine   Quad Sets Strengthening;Left;2 sets;10 reps    Heel Slides AROM;Left;2 sets;10 reps    Bridges 2 sets;10 reps    Straight Leg Raises Limitations 2x10        Other Supine Knee/Hip Exercises short sitting self flexion stretch 10s 10x      Manual Therapy   Manual Therapy Joint mobilization    Joint Mobilization L knee flexion mob 90 deg, posterior tib grade III                    PT Education - 08/25/20 1928     Education Details anatomy, exercise progression, DOMS expectations, quad muscle firing, HEP, compliance, cryotherapy    Person(s) Educated Patient    Methods Explanation;Tactile cues;Verbal cues    Comprehension Verbalized understanding;Returned demonstration;Verbal cues required              PT Short Term Goals - 08/19/20 1704       PT SHORT TERM GOAL #1   Title Patient will report decrease pain to less than 3/10.    Baseline 5/10    Time 4    Period Weeks    Status New    Target Date 09/17/20      PT SHORT TERM GOAL #2   Title Patient will improve hip flexion strength to 4+/5 in left knee in order to  increase walking distance.    Baseline 4/5    Time 4    Period Weeks    Status New    Target Date 09/17/20      PT SHORT TERM GOAL #3   Title Patient will demonstrate 110 degrees on the left    Baseline 110    Time 4    Period Weeks    Status New    Target Date 09/17/20               PT Long Term Goals - 08/19/20 1708       PT LONG TERM GOAL #1   Title Patient will improve 5x sit to stand to under 13 seconds in order to decrease risk of falls during ambulation.    Baseline 17 secs    Time 8    Period Weeks    Status New    Target Date 10/15/20      PT LONG TERM GOAL #2   Title Patient will show 5/5 strength in left knee in order to ambulate around community safely.    Baseline 4/5 knee flexion/extension, 4/5 hip flexion.    Time 8    Period Weeks    Status New    Target Date 10/15/20      PT LONG TERM GOAL #3   Title Patient will be able have active range of motion in left and knee atleast 95% equal in order to safely perform ADL's with appropriate mechanics.    Baseline LExt: -8 LFlex: 96 RExt: 0  RFlex: 110    Time 8    Period Weeks    Status New    Target Date 10/15/20      PT LONG TERM GOAL #4   Title Pt will be able to tolerate standing at least 30 minutes for improved home and community mobility.    Baseline Limited 5-10 mins at moment.    Time 4    Period Weeks    Status New    Target Date 09/16/20  Plan - 08/25/20 1723     Clinical Impression Statement Pt demonstrates signficant improvement in knee A/PROM at today's session with ability to reach 110 flexion and 0 deg flexion. Pt demonstrates good SLR without extensor lag and improved quadriceps endurance. Pt required cuing for equal WB during CKC exercise as well as quadriceps contraction with gait simulated training. Pt with minimal pain but moderate swelling. Pt HEP unchanged at this time due to mixed compliance. Pt edu given to improve independence with HEP  independence. Pt would benefit from continued skilled therapy in order to reach goals and maximize functional L LE strength and ROM for full return to PLOF.    Personal Factors and Comorbidities Age;Fitness;Past/Current Experience;Time since onset of injury/illness/exacerbation    Examination-Activity Limitations Transfers;Locomotion Level;Stairs;Stand;Hygiene/Grooming;Other    Examination-Participation Restrictions Community Activity;Driving;Cleaning    Stability/Clinical Decision Making Stable/Uncomplicated    Rehab Potential Excellent    PT Frequency 2x / week    PT Duration 8 weeks    PT Treatment/Interventions ADLs/Self Care Home Management;Therapeutic exercise;Aquatic Therapy;Cryotherapy;Iontophoresis 4mg /ml Dexamethasone;Moist Heat;Ultrasound;DME Instruction;Electrical Stimulation;Gait training;Stair training;Functional mobility training;Therapeutic activities;Balance training;Neuromuscular re-education;Manual techniques;Patient/family education;Orthotic Fit/Training;Scar mobilization;Dry needling;Energy conservation;Taping;Vasopneumatic Device    PT Next Visit Plan Review HEP, begin to incorporate hip strengthening exercises such as clamshells, bridges as tolerated, manuel therapy to assist in improving flexion and extension, joint mobs. Vaso for swelling if needed. Include balance exercises in weight bearing positions.    PT Home Exercise Plan Access Code: [email or web address]  URL: [email or web address]  Date: 08/19/2020  Prepared by: 10/19/2020    Exercises  Supine Heel Slide with Strap - 2 x daily - 7 x weekly - 3 sets - 10 reps  Supine Knee Extension Stretch on Towel Roll - 2 x daily - 7 x weekly - 3 sets - 10 reps  Supine Quad Set - 1 x daily - 7 x weekly - 3 sets - 10 reps  Small Range Straight Leg Raise - 1 x daily - 7 x weekly - 3 sets - 10 reps  Standing March with Counter Support - 1 x daily - 7 x weekly - 3 sets - 10 reps  Heel Raises with Counter Support - 1 x daily - 7 x weekly -  3 sets - 10 reps    Consulted and Agree with Plan of Care Patient    Family Member Consulted Husband             Patient will benefit from skilled therapeutic intervention in order to improve the following deficits and impairments:  Abnormal gait, Increased fascial restricitons, Pain, Decreased mobility, Decreased activity tolerance, Decreased range of motion, Decreased strength, Hypomobility, Decreased balance, Difficulty walking, Increased edema, Decreased endurance, Impaired flexibility, Decreased scar mobility  Visit Diagnosis: Acute pain of left knee  Stiffness of left knee, not elsewhere classified  Muscle weakness (generalized)  Difficulty walking     Problem List Patient Active Problem List   Diagnosis Date Noted   S/P TKR (total knee replacement), left 08/05/2020   Skin infection 05/28/2020   Wound of skin 05/28/2020   S/P knee surgery 05/28/2020   Eczematous dermatitis of upper eyelids of both eyes 05/28/2020   Osteoarthritis of left knee 05/27/2020   S/P total knee replacement, right 04/15/2020   Osteopenia 04/10/2020   Major depressive disorder, recurrent episode, mild (HCC) 10/31/2017   ADHD, predominantly inattentive type 10/31/2017   Primary osteoarthritis of right knee 09/09/2016   Primary osteoarthritis, right shoulder 09/09/2016   Arthritis 07/25/2012   Tinnitus 07/25/2012  Mixed incontinence urge and stress 07/25/2012    Zebedee Iba PT, DPT 08/25/20 7:32 PM   [facility] [address] [place], [place], [address] Phone: [phone]   Fax:  [phone]  Name: NAZLI PENN MRN: [id number] Date of Birth: 1945/11/05

## 2020-08-26 DIAGNOSIS — M1712 Unilateral primary osteoarthritis, left knee: Secondary | ICD-10-CM | POA: Diagnosis present

## 2020-08-27 ENCOUNTER — Ambulatory Visit (HOSPITAL_BASED_OUTPATIENT_CLINIC_OR_DEPARTMENT_OTHER): Payer: Medicare Other | Admitting: Physical Therapy

## 2020-08-27 ENCOUNTER — Encounter (HOSPITAL_BASED_OUTPATIENT_CLINIC_OR_DEPARTMENT_OTHER): Payer: Self-pay | Admitting: Physical Therapy

## 2020-08-27 ENCOUNTER — Other Ambulatory Visit: Payer: Self-pay

## 2020-08-27 DIAGNOSIS — M25562 Pain in left knee: Secondary | ICD-10-CM

## 2020-08-27 DIAGNOSIS — M25662 Stiffness of left knee, not elsewhere classified: Secondary | ICD-10-CM

## 2020-08-27 DIAGNOSIS — R262 Difficulty in walking, not elsewhere classified: Secondary | ICD-10-CM

## 2020-08-27 DIAGNOSIS — M6281 Muscle weakness (generalized): Secondary | ICD-10-CM

## 2020-08-27 NOTE — Therapy (Signed)
[facility] [address] [place], [place], [address] Phone: [phone]   Fax:  [phone]  Physical Therapy Treatment  Patient Details  Name: Megan Nunez MRN: [id number] Date of Birth: 1945-10-01 Referring Provider (PT): Teryl Lucy, MD   Encounter Date: 08/27/2020   PT End of Session - 08/27/20 1455     Visit Number 4    Number of Visits 16    Date for PT Re-Evaluation 10/15/20    Authorization Type [employer] Medicare    PT Start Time 1440   pt arrives late   PT Stop Time 1515    PT Time Calculation (min) 35 min    Activity Tolerance Patient tolerated treatment well    Behavior During Therapy [facility] for tasks assessed/performed             Past Medical History:  Diagnosis Date   Anxiety    Arthritis    Depression    Family history of adverse reaction to anesthesia 2012   Mother has cardiac  arrest after surgery   Hepatitis    high antibodies for hepatitis   Obesity     Past Surgical History:  Procedure Laterality Date   LAPAROSCOPIC TUBAL LIGATION  1985   TOTAL KNEE ARTHROPLASTY Right 04/15/2020   Procedure: TOTAL KNEE ARTHROPLASTY;  Surgeon: Teryl Lucy, MD;  Location: [facility];  Service: Orthopedics;  Laterality: Right;   TOTAL KNEE ARTHROPLASTY Left 08/05/2020   Procedure: TOTAL KNEE ARTHROPLASTY;  Surgeon: Teryl Lucy, MD;  Location: [facility];  Service: Orthopedics;  Laterality: Left;   WISDOM TOOTH EXTRACTION     age 75    There were no vitals filed for this visit.   Subjective Assessment - 08/27/20 1447     Subjective Pt states she was not too sore after last session but was fatigued after PT and her grocery store shopping. Pt does not complain of pain today.    Patient is accompained by: Family member    Pertinent History right total knee in the past, anxiety, depression    Limitations Standing;Walking;Lifting    How long can you sit comfortably? n/a    How long can you stand comfortably? 10-15  mins    How long can you walk comfortably? limited distance,    Patient Stated Goals improve strenght, endurance,    Currently in Pain? No/denies    Pain Score 0-No pain    Pain Onset 1 to 4 weeks ago                  [facility] Adult PT Treatment/Exercise - 08/27/20 0001       Transfers   Transfers Sit to Stand;Stand to Sit    Sit to Stand 7: Independent      Knee/Hip Exercises: Standing   Other Standing Knee Exercises HR/TR 20x  Heel toe rock 10x     Knee/Hip Exercises: Seated   Long Arc Quad Limitations 20x   1lb     Knee/Hip Exercises: Supine   Quad Sets Strengthening;Left;2 sets;10 reps        Bridges 10 reps;3 sets    Straight Leg Raises Limitations 2x10   1lb   Other Supine Knee/Hip Exercises short sitting self flexion stretch 10s 10x      Manual Therapy   Manual Therapy Joint mobilization    Joint Mobilization L knee flexion mob 90 deg, posterior tib grade III  PT Short Term Goals - 08/19/20 1704       PT SHORT TERM GOAL #1   Title Patient will report decrease pain to less than 3/10.    Baseline 5/10    Time 4    Period Weeks    Status New    Target Date 09/17/20      PT SHORT TERM GOAL #2   Title Patient will improve hip flexion strength to 4+/5 in left knee in order to increase walking distance.    Baseline 4/5    Time 4    Period Weeks    Status New    Target Date 09/17/20      PT SHORT TERM GOAL #3   Title Patient will demonstrate 110 degrees on the left    Baseline 110    Time 4    Period Weeks    Status New    Target Date 09/17/20               PT Long Term Goals - 08/19/20 1708       PT LONG TERM GOAL #1   Title Patient will improve 5x sit to stand to under 13 seconds in order to decrease risk of falls during ambulation.    Baseline 17 secs    Time 8    Period Weeks    Status New    Target Date 10/15/20      PT LONG TERM GOAL #2   Title Patient will show 5/5 strength in left knee in  order to ambulate around community safely.    Baseline 4/5 knee flexion/extension, 4/5 hip flexion.    Time 8    Period Weeks    Status New    Target Date 10/15/20      PT LONG TERM GOAL #3   Title Patient will be able have active range of motion in left and knee atleast 95% equal in order to safely perform ADL's with appropriate mechanics.    Baseline LExt: -8 LFlex: 96 RExt: 0  RFlex: 110    Time 8    Period Weeks    Status New    Target Date 10/15/20      PT LONG TERM GOAL #4   Title Pt will be able to tolerate standing at least 30 minutes for improved home and community mobility.    Baseline Limited 5-10 mins at moment.    Time 4    Period Weeks    Status New    Target Date 09/16/20                   Plan - 08/27/20 1456     Clinical Impression Statement Pt able to increase intensity and volume of quadriceps loading at today's session with SLR and LAQ. Pt had good TKE with CKC exercise and able to continue with progressing standing exercise tolerance. Pt's flexion ROM continues to improve with flexion mobility. Pt would benefit from continued skilled therapy in order to reach goals and maximize functional L LE strength and ROM for full return to PLOF.    Personal Factors and Comorbidities Age;Fitness;Past/Current Experience;Time since onset of injury/illness/exacerbation    Examination-Activity Limitations Transfers;Locomotion Level;Stairs;Stand;Hygiene/Grooming;Other    Examination-Participation Restrictions Community Activity;Driving;Cleaning    Stability/Clinical Decision Making Stable/Uncomplicated    Rehab Potential Excellent    PT Frequency 2x / week    PT Duration 8 weeks    PT Treatment/Interventions ADLs/Self Care Home Management;Therapeutic exercise;Aquatic Therapy;Cryotherapy;Iontophoresis 4mg /ml Dexamethasone;Moist Heat;Ultrasound;DME Instruction;Electrical Stimulation;Gait  training;Stair training;Functional mobility training;Therapeutic activities;Balance  training;Neuromuscular re-education;Manual techniques;Patient/family education;Orthotic Fit/Training;Scar mobilization;Dry needling;Energy conservation;Taping;Vasopneumatic Device    PT Next Visit Plan Review HEP, begin to incorporate hip strengthening exercises such as clamshells, bridges as tolerated, manuel therapy to assist in improving flexion and extension, joint mobs. Vaso for swelling if needed. Include balance exercises in weight bearing positions.    PT Home Exercise Plan Access Code: [email or web address]  URL: [email or web address]  Date: 08/19/2020  Prepared by: Lorayne Bender    Exercises  Supine Heel Slide with Strap - 2 x daily - 7 x weekly - 3 sets - 10 reps  Supine Knee Extension Stretch on Towel Roll - 2 x daily - 7 x weekly - 3 sets - 10 reps  Supine Quad Set - 1 x daily - 7 x weekly - 3 sets - 10 reps  Small Range Straight Leg Raise - 1 x daily - 7 x weekly - 3 sets - 10 reps  Standing March with Counter Support - 1 x daily - 7 x weekly - 3 sets - 10 reps  Heel Raises with Counter Support - 1 x daily - 7 x weekly - 3 sets - 10 reps    Consulted and Agree with Plan of Care Patient    Family Member Consulted Husband             Patient will benefit from skilled therapeutic intervention in order to improve the following deficits and impairments:  Abnormal gait, Increased fascial restricitons, Pain, Decreased mobility, Decreased activity tolerance, Decreased range of motion, Decreased strength, Hypomobility, Decreased balance, Difficulty walking, Increased edema, Decreased endurance, Impaired flexibility, Decreased scar mobility  Visit Diagnosis: Acute pain of left knee  Stiffness of left knee, not elsewhere classified  Muscle weakness (generalized)  Difficulty walking     Problem List Patient Active Problem List   Diagnosis Date Noted   S/P TKR (total knee replacement), left 08/05/2020   Skin infection 05/28/2020   Wound of skin 05/28/2020   S/P knee surgery  05/28/2020   Eczematous dermatitis of upper eyelids of both eyes 05/28/2020   Osteoarthritis of left knee 05/27/2020   S/P total knee replacement, right 04/15/2020   Osteopenia 04/10/2020   Major depressive disorder, recurrent episode, mild (HCC) 10/31/2017   ADHD, predominantly inattentive type 10/31/2017   Primary osteoarthritis of right knee 09/09/2016   Primary osteoarthritis, right shoulder 09/09/2016   Arthritis 07/25/2012   Tinnitus 07/25/2012   Mixed incontinence urge and stress 07/25/2012   Zebedee Iba PT, DPT 08/27/20 3:05 PM   [facility] [address] [place], [place], [address] Phone: [phone]   Fax:  [phone]  Name: Megan Nunez MRN: [id number] Date of Birth: 1945/10/15

## 2020-08-29 ENCOUNTER — Encounter (HOSPITAL_BASED_OUTPATIENT_CLINIC_OR_DEPARTMENT_OTHER): Payer: Medicare Other | Admitting: Physical Therapy

## 2020-09-01 ENCOUNTER — Other Ambulatory Visit: Payer: Self-pay

## 2020-09-01 ENCOUNTER — Ambulatory Visit (HOSPITAL_BASED_OUTPATIENT_CLINIC_OR_DEPARTMENT_OTHER): Payer: Medicare Other | Admitting: Physical Therapy

## 2020-09-01 ENCOUNTER — Encounter (HOSPITAL_BASED_OUTPATIENT_CLINIC_OR_DEPARTMENT_OTHER): Payer: Self-pay | Admitting: Physical Therapy

## 2020-09-01 DIAGNOSIS — M6281 Muscle weakness (generalized): Secondary | ICD-10-CM | POA: Diagnosis not present

## 2020-09-01 DIAGNOSIS — M25662 Stiffness of left knee, not elsewhere classified: Secondary | ICD-10-CM

## 2020-09-01 DIAGNOSIS — R262 Difficulty in walking, not elsewhere classified: Secondary | ICD-10-CM | POA: Diagnosis not present

## 2020-09-01 DIAGNOSIS — M25562 Pain in left knee: Secondary | ICD-10-CM

## 2020-09-01 NOTE — Therapy (Signed)
[facility] [address] [place], [place], [address] Phone: [phone]   Fax:  [phone]  Physical Therapy Treatment  Patient Details  Name: Megan Nunez MRN: [id number] Date of Birth: February 08, 1945 Referring Provider (PT): Megan Lucy, MD   Encounter Date: 09/01/2020   PT End of Session - 09/01/20 1520     Visit Number 5    Number of Visits 16    Date for PT Re-Evaluation 10/15/20    Authorization Type [employer] Medicare    PT Start Time 1518    PT Stop Time 1559    PT Time Calculation (min) 41 min    Activity Tolerance Patient tolerated treatment well    Behavior During Therapy Megan Nunez for tasks assessed/performed             Past Medical History:  Diagnosis Date   Anxiety    Arthritis    Depression    Family history of adverse reaction to anesthesia 2012   Mother has cardiac  arrest after surgery   Hepatitis    high antibodies for hepatitis   Obesity     Past Surgical History:  Procedure Laterality Date   LAPAROSCOPIC TUBAL LIGATION  1985   TOTAL KNEE ARTHROPLASTY Right 04/15/2020   Procedure: TOTAL KNEE ARTHROPLASTY;  Surgeon: Megan Lucy, MD;  Location: [facility];  Service: Orthopedics;  Laterality: Right;   TOTAL KNEE ARTHROPLASTY Left 08/05/2020   Procedure: TOTAL KNEE ARTHROPLASTY;  Surgeon: Megan Lucy, MD;  Location: [facility];  Service: Orthopedics;  Laterality: Left;   WISDOM TOOTH EXTRACTION     age 21    There were no vitals filed for this visit.   Subjective Assessment - 09/01/20 1518     Subjective Denies N/T just feels tight like there is not enough skin.    Patient Stated Goals improve strenght, endurance,    Currently in Pain? No/denies                               [facility] Adult PT Treatment/Exercise - 09/01/20 0001       Knee/Hip Exercises: Standing   Lateral Step Up Limitations hip hike prog to lateral step up with level pelvis    Other Standing Knee Exercises  lateral weight shift with level pelvis      Knee/Hip Exercises: Seated   Sit to Sand without UE support;15 reps   gluts+core     Knee/Hip Exercises: Supine   Short Arc Quad Sets Left;15 reps    Bridges with Megan Nunez Strengthening;15 reps   cues for core                     PT Short Term Goals - 08/19/20 1704       PT SHORT TERM GOAL #1   Title Patient will report decrease pain to less than 3/10.    Baseline 5/10    Time 4    Period Weeks    Status New    Target Date 09/17/20      PT SHORT TERM GOAL #2   Title Patient will improve hip flexion strength to 4+/5 in left knee in order to increase walking distance.    Baseline 4/5    Time 4    Period Weeks    Status New    Target Date 09/17/20      PT SHORT TERM GOAL #3   Title Patient will demonstrate 110  degrees on the left    Baseline 110    Time 4    Period Weeks    Status New    Target Date 09/17/20               PT Long Term Goals - 08/19/20 1708       PT LONG TERM GOAL #1   Title Patient will improve 5x sit to stand to under 13 seconds in order to decrease risk of falls during ambulation.    Baseline 17 secs    Time 8    Period Weeks    Status New    Target Date 10/15/20      PT LONG TERM GOAL #2   Title Patient will show 5/5 strength in left knee in order to ambulate around community safely.    Baseline 4/5 knee flexion/extension, 4/5 hip flexion.    Time 8    Period Weeks    Status New    Target Date 10/15/20      PT LONG TERM GOAL #3   Title Patient will be able have active range of motion in left and knee atleast 95% equal in order to safely perform ADL's with appropriate mechanics.    Baseline LExt: -8 LFlex: 96 RExt: 0  RFlex: 110    Time 8    Period Weeks    Status New    Target Date 10/15/20      PT LONG TERM GOAL #4   Title Pt will be able to tolerate standing at least 30 minutes for improved home and community mobility.    Baseline Limited 5-10 mins at moment.     Time 4    Period Weeks    Status New    Target Date 09/16/20                   Plan - 09/01/20 1545     Clinical Impression Statement Added CKC strength for hip abd activation to decrease trendelenburg gait. Prog OKC HEP with cues to add core engagement with all exercises.    PT Treatment/Interventions ADLs/Self Care Home Management;Therapeutic exercise;Aquatic Therapy;Cryotherapy;Iontophoresis 4mg /ml Dexamethasone;Moist Heat;Ultrasound;DME Instruction;Electrical Stimulation;Gait training;Stair training;Functional mobility training;Therapeutic activities;Balance training;Neuromuscular re-education;Manual techniques;Patient/family education;Orthotic Fit/Training;Scar mobilization;Dry needling;Energy conservation;Taping;Vasopneumatic Device    PT Next Visit Plan cont CKC with balance challenges- engaging hip abd group+core    PT Home Exercise Plan Access Code: [email or web address]    Consulted and Agree with Plan of Care Patient             Patient will benefit from skilled therapeutic intervention in order to improve the following deficits and impairments:  Abnormal gait, Increased fascial restricitons, Pain, Decreased mobility, Decreased activity tolerance, Decreased range of motion, Decreased strength, Hypomobility, Decreased balance, Difficulty walking, Increased edema, Decreased endurance, Impaired flexibility, Decreased scar mobility  Visit Diagnosis: Acute pain of left knee  Stiffness of left knee, not elsewhere classified  Muscle weakness (generalized)  Difficulty walking     Problem List Patient Active Problem List   Diagnosis Date Noted   S/P TKR (total knee replacement), left 08/05/2020   Skin infection 05/28/2020   Wound of skin 05/28/2020   S/P knee surgery 05/28/2020   Eczematous dermatitis of upper eyelids of both eyes 05/28/2020   Osteoarthritis of left knee 05/27/2020   S/P total knee replacement, right 04/15/2020   Osteopenia 04/10/2020   Major depressive  disorder, recurrent episode, mild (HCC) 10/31/2017   ADHD, predominantly inattentive type 10/31/2017   Primary osteoarthritis of  right knee 09/09/2016   Primary osteoarthritis, right shoulder 09/09/2016   Arthritis 07/25/2012   Tinnitus 07/25/2012   Mixed incontinence urge and stress 07/25/2012   Melenie Minniear C. Tawnia Schirm PT, DPT 09/01/20 8:45 PM  [facility] [address] [place], [place], [address] Phone: [phone]   Fax:  [phone]  Name: Megan Nunez MRN: [id number] Date of Birth: 1945/02/11

## 2020-09-04 ENCOUNTER — Ambulatory Visit (HOSPITAL_BASED_OUTPATIENT_CLINIC_OR_DEPARTMENT_OTHER): Payer: Medicare Other | Admitting: Physical Therapy

## 2020-09-04 ENCOUNTER — Other Ambulatory Visit: Payer: Self-pay

## 2020-09-04 DIAGNOSIS — M6281 Muscle weakness (generalized): Secondary | ICD-10-CM

## 2020-09-04 DIAGNOSIS — M25562 Pain in left knee: Secondary | ICD-10-CM | POA: Diagnosis not present

## 2020-09-04 DIAGNOSIS — M25662 Stiffness of left knee, not elsewhere classified: Secondary | ICD-10-CM

## 2020-09-04 DIAGNOSIS — R262 Difficulty in walking, not elsewhere classified: Secondary | ICD-10-CM

## 2020-09-04 NOTE — Therapy (Signed)
[facility] [address] [place], [place], [address] Phone: [phone]   Fax:  [phone]  Physical Therapy Treatment  Patient Details  Name: Megan Nunez MRN: [id number] Date of Birth: 1945-02-03 Referring Provider (PT): Teryl Lucy, MD   Encounter Date: 09/04/2020   PT End of Session - 09/04/20 1437     Visit Number 6    Number of Visits 16    Date for PT Re-Evaluation 10/15/20    Authorization Type UHC Medicare    PT Start Time 1430    PT Stop Time 1510    PT Time Calculation (min) 40 min    Activity Tolerance Patient tolerated treatment well    Behavior During Therapy [facility] for tasks assessed/performed             Past Medical History:  Diagnosis Date   Anxiety    Arthritis    Depression    Family history of adverse reaction to anesthesia 2012   Mother has cardiac  arrest after surgery   Hepatitis    high antibodies for hepatitis   Obesity     Past Surgical History:  Procedure Laterality Date   LAPAROSCOPIC TUBAL LIGATION  1985   TOTAL KNEE ARTHROPLASTY Right 04/15/2020   Procedure: TOTAL KNEE ARTHROPLASTY;  Surgeon: Teryl Lucy, MD;  Location: [facility];  Service: Orthopedics;  Laterality: Right;   TOTAL KNEE ARTHROPLASTY Left 08/05/2020   Procedure: TOTAL KNEE ARTHROPLASTY;  Surgeon: Teryl Lucy, MD;  Location: [facility];  Service: Orthopedics;  Laterality: Left;   WISDOM TOOTH EXTRACTION     age 75    There were no vitals filed for this visit.   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 Pt states the knee feels good "most of the time." Pt states the knee is tired in the evening.    Patient Stated Goals improve strenght, endurance,    Currently in Pain? No/denies    Pain Score 0-No pain                   [facility] Adult PT Treatment/Exercise - 09/04/20 0001       Knee/Hip Exercises: Aerobic    Recumbent Bike L2 seat 5, aiming for stretch at top of revolution      Knee/Hip Exercises: Standing   Other Standing Knee Exercises tandem stance 30s 3x, unable to perform SLS   Other Standing Knee Exercises fwd and lateral step up 2x10, GTB TKE 2x10 at rail      Knee/Hip Exercises: Seated   Long Arc Quad Limitations 20x   4lb         HR/TR 20x           Other Supine Knee/Hip Exercises short sitting self flexion stretch 10s 10x                                 PT Education - 09/04/20 1434     Education Details Knee ROM ,exercise progression, DOMS expectations, muscle firing, HEP    Person(s) Educated Patient    Methods Explanation;Demonstration;Tactile cues;Verbal cues    Comprehension Verbalized understanding;Returned demonstration;Verbal cues required              PT Short Term Goals - 08/19/20 1704       PT SHORT TERM GOAL #1  Title Patient will report decrease pain to less than 3/10.    Baseline 5/10    Time 4    Period Weeks    Status New    Target Date 09/17/20      PT SHORT TERM GOAL #2   Title Patient will improve hip flexion strength to 4+/5 in left knee in order to increase walking distance.    Baseline 4/5    Time 4    Period Weeks    Status New    Target Date 09/17/20      PT SHORT TERM GOAL #3   Title Patient will demonstrate 110 degrees on the left    Baseline 110    Time 4    Period Weeks    Status New    Target Date 09/17/20               PT Long Term Goals - 08/19/20 1708       PT LONG TERM GOAL #1   Title Patient will improve 5x sit to stand to under 13 seconds in order to decrease risk of falls during ambulation.    Baseline 17 secs    Time 8    Period Weeks    Status New    Target Date 10/15/20      PT LONG TERM GOAL #2   Title Patient will show 5/5 strength in left knee in order to ambulate around community safely.    Baseline 4/5 knee flexion/extension, 4/5 hip flexion.    Time 8    Period Weeks     Status New    Target Date 10/15/20      PT LONG TERM GOAL #3   Title Patient will be able have active range of motion in left and knee atleast 95% equal in order to safely perform ADL's with appropriate mechanics.    Baseline LExt: -8 LFlex: 96 RExt: 0  RFlex: 110    Time 8    Period Weeks    Status New    Target Date 10/15/20      PT LONG TERM GOAL #4   Title Pt will be able to tolerate standing at least 30 minutes for improved home and community mobility.    Baseline Limited 5-10 mins at moment.    Time 4    Period Weeks    Status New    Target Date 09/16/20                   Plan - 09/04/20 1452     Clinical Impression Statement Pt able to continue with progression of L knee strengthening exercise. Weight increased with LAQ and quad set progressed to standing TKE. Pt required VC and TC for level pelvis and quadriceps contraction with CKC step up exercise. Pt unable to do full SLS, regressed to tandem balance. Pt progressed knee flexion up to  104 today. Pt would benefit from continued skilled therapy in order to reach goals and maximize functional L LE strength and ROM for full return to PLOF.    PT Treatment/Interventions ADLs/Self Care Home Management;Therapeutic exercise;Aquatic Therapy;Cryotherapy;Iontophoresis 4mg /ml Dexamethasone;Moist Heat;Ultrasound;DME Instruction;Electrical Stimulation;Gait training;Stair training;Functional mobility training;Therapeutic activities;Balance training;Neuromuscular re-education;Manual techniques;Patient/family education;Orthotic Fit/Training;Scar mobilization;Dry needling;Energy conservation;Taping;Vasopneumatic Device    PT Next Visit Plan balance progression, extensor strength progression    PT Home Exercise Plan Access Code: [email or web address]    Consulted and Agree with Plan of Care Patient             Patient  will benefit from skilled therapeutic intervention in order to improve the following deficits and impairments:  Abnormal  gait, Increased fascial restricitons, Pain, Decreased mobility, Decreased activity tolerance, Decreased range of motion, Decreased strength, Hypomobility, Decreased balance, Difficulty walking, Increased edema, Decreased endurance, Impaired flexibility, Decreased scar mobility  Visit Diagnosis: Acute pain of left knee  Stiffness of left knee, not elsewhere classified  Muscle weakness (generalized)  Difficulty walking     Problem List Patient Active Problem List   Diagnosis Date Noted   S/P TKR (total knee replacement), left 08/05/2020   Skin infection 05/28/2020   Wound of skin 05/28/2020   S/P knee surgery 05/28/2020   Eczematous dermatitis of upper eyelids of both eyes 05/28/2020   Osteoarthritis of left knee 05/27/2020   S/P total knee replacement, right 04/15/2020   Osteopenia 04/10/2020   Major depressive disorder, recurrent episode, mild (HCC) 10/31/2017   ADHD, predominantly inattentive type 10/31/2017   Primary osteoarthritis of right knee 09/09/2016   Primary osteoarthritis, right shoulder 09/09/2016   Arthritis 07/25/2012   Tinnitus 07/25/2012   Mixed incontinence urge and stress 07/25/2012   Zebedee Iba PT, DPT 09/04/20 3:07 PM   [facility] [address] [place], [place], [address] Phone: [phone]   Fax:  [phone]  Name: Megan Nunez MRN: [id number] Date of Birth: 1945/03/26

## 2020-09-04 NOTE — Patient Instructions (Signed)
Access Code: [email or web address] URL: [email or web address] Date: 09/04/2020 Prepared by: Zebedee Iba  Exercises Supine Heel Slide with Strap - 2 x daily - 7 x weekly - 3 sets - 10 reps Active Straight Leg Raise Advanced - 1 x daily - 5 x weekly - 2 sets - 15 reps Straight Leg Raise with External Rotation - 1 x daily - 5 x weekly - 2 sets - 15 reps Sidelying Hip Abduction - 1 x daily - 5 x weekly - 2 sets - 20 reps Heel Raises with Counter Support - 1 x daily - 5 x weekly - 2 sets - 20 reps Hip Hiking on Step - 1 x daily - 5 x weekly - 3 sets - 10 reps Lateral Step Up - 1 x daily - 7 x weekly - 3 sets - 10 reps Standing Terminal Knee Extension with Resistance - 1 x daily - 5 x weekly - 3 sets - 10 reps

## 2020-09-08 ENCOUNTER — Ambulatory Visit (HOSPITAL_BASED_OUTPATIENT_CLINIC_OR_DEPARTMENT_OTHER): Payer: Medicare Other | Admitting: Physical Therapy

## 2020-09-08 ENCOUNTER — Other Ambulatory Visit: Payer: Self-pay

## 2020-09-08 ENCOUNTER — Encounter (HOSPITAL_BASED_OUTPATIENT_CLINIC_OR_DEPARTMENT_OTHER): Payer: Self-pay | Admitting: Physical Therapy

## 2020-09-08 DIAGNOSIS — M6281 Muscle weakness (generalized): Secondary | ICD-10-CM

## 2020-09-08 DIAGNOSIS — M25562 Pain in left knee: Secondary | ICD-10-CM

## 2020-09-08 DIAGNOSIS — R262 Difficulty in walking, not elsewhere classified: Secondary | ICD-10-CM | POA: Diagnosis not present

## 2020-09-08 DIAGNOSIS — M25662 Stiffness of left knee, not elsewhere classified: Secondary | ICD-10-CM | POA: Diagnosis not present

## 2020-09-08 NOTE — Therapy (Signed)
[facility] [address] [place], [place], [address] Phone: [phone]   Fax:  [phone]  Physical Therapy Treatment  Patient Details  Name: Megan Nunez MRN: [id number] Date of Birth: [birth year] Referring Provider (PT): Teryl Lucy, MD   Encounter Date: 09/08/2020   PT End of Session - 09/08/20 1121     Visit Number 7    Number of Visits 16    Date for PT Re-Evaluation 10/15/20    Authorization Type U[employer] Medicare apply KX 2nd to previous therapy visits    PT Start Time 1104   patient 4 minutes late   PT Stop Time 1145    PT Time Calculation (min) 41 min    Activity Tolerance Patient tolerated treatment well    Behavior During Therapy [facility] for tasks assessed/performed             Past Medical History:  Diagnosis Date   Anxiety    Arthritis    Depression    Family history of adverse reaction to anesthesia 2012   Mother has cardiac  arrest after surgery   Hepatitis    high antibodies for hepatitis   Obesity     Past Surgical History:  Procedure Laterality Date   LAPAROSCOPIC TUBAL LIGATION  1985   TOTAL KNEE ARTHROPLASTY Right 04/15/2020   Procedure: TOTAL KNEE ARTHROPLASTY;  Surgeon: Teryl Lucy, MD;  Location: [facility];  Service: Orthopedics;  Laterality: Right;   TOTAL KNEE ARTHROPLASTY Left 08/05/2020   Procedure: TOTAL KNEE ARTHROPLASTY;  Surgeon: Teryl Lucy, MD;  Location: [facility];  Service: Orthopedics;  Laterality: Left;   WISDOM TOOTH EXTRACTION     age 75    There were no vitals filed for this visit.   Subjective Assessment - 09/08/20 1108     Subjective Patient has not been having much pain. She has been more complaint with her HEP.    Patient is accompained by: Family member    Pertinent History right total knee in the past, anxiety, depression    Limitations Standing;Walking;Lifting    How long can you sit comfortably? n/a    How long can you stand comfortably? 10-15 mins    How long  can you walk comfortably? limited distance,    Patient Stated Goals improve strenght, endurance,    Currently in Pain? No/denies                [facility] PT Assessment - 09/08/20 0001       AROM   Left Knee Extension 0    Left Knee Flexion 115                           [facility] Adult PT Treatment/Exercise - 09/08/20 0001       Knee/Hip Exercises: Standing   Other Standing Knee Exercises fwd and lateral step up 2x10, GTB TKE 2x10 at rail      Knee/Hip Exercises: Seated   Long Arc Quad Limitations 20x   4lb     Knee/Hip Exercises: Supine   Bridges with [employer] Strengthening;15 reps   cues for core   Straight Leg Raises Limitations 3x10      Modalities   Modalities Cryotherapy      Cryotherapy   Number Minutes Cryotherapy 10 Minutes    Cryotherapy Location Knee    Type of Cryotherapy Ice pack      Manual Therapy   Manual Therapy Joint mobilization  Joint Mobilization L knee flexion mob 90 deg, posterior tib grade III    Passive ROM full range noted                    PT Education - 09/08/20 1112     Education Details reviewed ROM and    Person(s) Educated Patient    Methods Explanation;Demonstration    Comprehension Verbalized understanding;Returned demonstration              PT Short Term Goals - 08/19/20 1704       PT SHORT TERM GOAL #1   Title Patient will report decrease pain to less than 3/10.    Baseline 5/10    Time 4    Period Weeks    Status New    Target Date 09/17/20      PT SHORT TERM GOAL #2   Title Patient will improve hip flexion strength to 4+/5 in left knee in order to increase walking distance.    Baseline 4/5    Time 4    Period Weeks    Status New    Target Date 09/17/20      PT SHORT TERM GOAL #3   Title Patient will demonstrate 110 degrees on the left    Baseline 110    Time 4    Period Weeks    Status New    Target Date 09/17/20               PT Long Term Goals - 08/19/20 1708        PT LONG TERM GOAL #1   Title Patient will improve 5x sit to stand to under 13 seconds in order to decrease risk of falls during ambulation.    Baseline 17 secs    Time 8    Period Weeks    Status New    Target Date 10/15/20      PT LONG TERM GOAL #2   Title Patient will show 5/5 strength in left knee in order to ambulate around community safely.    Baseline 4/5 knee flexion/extension, 4/5 hip flexion.    Time 8    Period Weeks    Status New    Target Date 10/15/20      PT LONG TERM GOAL #3   Title Patient will be able have active range of motion in left and knee atleast 95% equal in order to safely perform ADL's with appropriate mechanics.    Baseline LExt: -8 LFlex: 96 RExt: 0  RFlex: 110    Time 8    Period Weeks    Status New    Target Date 10/15/20      PT LONG TERM GOAL #4   Title Pt will be able to tolerate standing at least 30 minutes for improved home and community mobility.    Baseline Limited 5-10 mins at moment.    Time 4    Period Weeks    Status New    Target Date 09/16/20                   Plan - 09/08/20 1122     Clinical Impression Statement Patient continues to progress. her Knee ROM was measured at 115 after manual therapy. She tolerated ther-ex well. Therapy reviewed tehcniuqe weight shifting. Therapy also reviewed tehcniuqe going down steps. She did well but had to concentrate to nto drop her hip. She requested to ice here today> she reported at onepoint that she  had minor swelling in the knee.    Personal Factors and Comorbidities Age;Fitness;Past/Current Experience;Time since onset of injury/illness/exacerbation    Examination-Activity Limitations Transfers;Locomotion Level;Stairs;Stand;Hygiene/Grooming;Other    Examination-Participation Restrictions Community Activity;Driving;Cleaning    Stability/Clinical Decision Making Stable/Uncomplicated    Clinical Decision Making Low    Rehab Potential Excellent    PT Frequency 2x / week    PT  Duration 8 weeks    PT Treatment/Interventions ADLs/Self Care Home Management;Therapeutic exercise;Aquatic Therapy;Cryotherapy;Iontophoresis 4mg /ml Dexamethasone;Moist Heat;Ultrasound;DME Instruction;Electrical Stimulation;Gait training;Stair training;Functional mobility training;Therapeutic activities;Balance training;Neuromuscular re-education;Manual techniques;Patient/family education;Orthotic Fit/Training;Scar mobilization;Dry needling;Energy conservation;Taping;Vasopneumatic Device    PT Next Visit Plan balance progression, extensor strength progression    PT Home Exercise Plan Access Code: [email or web address]    Consulted and Agree with Plan of Care Patient    Family Member Consulted Husband             Patient will benefit from skilled therapeutic intervention in order to improve the following deficits and impairments:  Abnormal gait, Increased fascial restricitons, Pain, Decreased mobility, Decreased activity tolerance, Decreased range of motion, Decreased strength, Hypomobility, Decreased balance, Difficulty walking, Increased edema, Decreased endurance, Impaired flexibility, Decreased scar mobility  Visit Diagnosis: Acute pain of left knee  Stiffness of left knee, not elsewhere classified  Muscle weakness (generalized)  Difficulty walking     Problem List Patient Active Problem List   Diagnosis Date Noted   S/P TKR (total knee replacement), left 08/05/2020   Skin infection 05/28/2020   Wound of skin 05/28/2020   S/P knee surgery 05/28/2020   Eczematous dermatitis of upper eyelids of both eyes 05/28/2020   Osteoarthritis of left knee 05/27/2020   S/P total knee replacement, right 04/15/2020   Osteopenia 04/10/2020   Major depressive disorder, recurrent episode, mild (HCC) 10/31/2017   ADHD, predominantly inattentive type 10/31/2017   Primary osteoarthritis of right knee 09/09/2016   Primary osteoarthritis, right shoulder 09/09/2016   Arthritis 07/25/2012   Tinnitus 07/25/2012    Mixed incontinence urge and stress 07/25/2012    09/25/2012 PT DPT  09/08/2020, 4:36 PM  [facility] [address] [place], [place], [place] Phone: [phone]   Fax:  [phone]  Name: Megan Nunez MRN: [name] Date of Birth: 1945/09/29

## 2020-09-11 ENCOUNTER — Ambulatory Visit (HOSPITAL_BASED_OUTPATIENT_CLINIC_OR_DEPARTMENT_OTHER): Payer: Medicare Other | Admitting: Physical Therapy

## 2020-09-11 ENCOUNTER — Other Ambulatory Visit: Payer: Self-pay

## 2020-09-11 DIAGNOSIS — M6281 Muscle weakness (generalized): Secondary | ICD-10-CM | POA: Diagnosis not present

## 2020-09-11 DIAGNOSIS — M25562 Pain in left knee: Secondary | ICD-10-CM

## 2020-09-11 DIAGNOSIS — M25662 Stiffness of left knee, not elsewhere classified: Secondary | ICD-10-CM

## 2020-09-11 DIAGNOSIS — R262 Difficulty in walking, not elsewhere classified: Secondary | ICD-10-CM

## 2020-09-12 ENCOUNTER — Encounter (HOSPITAL_BASED_OUTPATIENT_CLINIC_OR_DEPARTMENT_OTHER): Payer: Self-pay | Admitting: Physical Therapy

## 2020-09-12 NOTE — Therapy (Signed)
[facility] [address] [place], [place], [address] Phone: [phone]   Fax:  [phone]  Patient Details  Name: Megan Nunez MRN: [id number] Date of Birth: 12/31/1945 Referring Provider:  Ronnald Nian, MD  Encounter Date: 09/11/2020   Dessie Coma PT DPT  09/12/2020, 10:08 AM  [facility] [address] [place], [place], [address] Phone: [phone]   Fax:  [phone]

## 2020-09-15 ENCOUNTER — Other Ambulatory Visit: Payer: Self-pay

## 2020-09-15 ENCOUNTER — Ambulatory Visit (HOSPITAL_BASED_OUTPATIENT_CLINIC_OR_DEPARTMENT_OTHER): Payer: Medicare Other | Admitting: Physical Therapy

## 2020-09-15 ENCOUNTER — Encounter (HOSPITAL_BASED_OUTPATIENT_CLINIC_OR_DEPARTMENT_OTHER): Payer: Self-pay | Admitting: Physical Therapy

## 2020-09-15 DIAGNOSIS — M25662 Stiffness of left knee, not elsewhere classified: Secondary | ICD-10-CM

## 2020-09-15 DIAGNOSIS — M6281 Muscle weakness (generalized): Secondary | ICD-10-CM | POA: Diagnosis not present

## 2020-09-15 DIAGNOSIS — M25562 Pain in left knee: Secondary | ICD-10-CM

## 2020-09-15 DIAGNOSIS — R262 Difficulty in walking, not elsewhere classified: Secondary | ICD-10-CM

## 2020-09-15 NOTE — Therapy (Signed)
[facility] [address] [place], [place], [address] Phone: [phone]   Fax:  [phone]  Physical Therapy Treatment  Patient Details  Name: Megan Nunez MRN: [id number] Date of Birth: 02/22/1945 Referring Provider (PT): Teryl Lucy, MD   Encounter Date: 09/15/2020   PT End of Session - 09/15/20 1140     Visit Number 9    Number of Visits 16    Date for PT Re-Evaluation 10/15/20    Authorization Type U[employer] Medicare apply KX 2nd to previous therapy visits    PT Start Time 1105   Patient 5 min late   PT Stop Time 1155    PT Time Calculation (min) 50 min    Activity Tolerance Patient tolerated treatment well    Behavior During Therapy [facility] for tasks assessed/performed             Past Medical History:  Diagnosis Date   Anxiety    Arthritis    Depression    Family history of adverse reaction to anesthesia 2012   Mother has cardiac  arrest after surgery   Hepatitis    high antibodies for hepatitis   Obesity     Past Surgical History:  Procedure Laterality Date   LAPAROSCOPIC TUBAL LIGATION  1985   TOTAL KNEE ARTHROPLASTY Right 04/15/2020   Procedure: TOTAL KNEE ARTHROPLASTY;  Surgeon: Teryl Lucy, MD;  Location: [facility];  Service: Orthopedics;  Laterality: Right;   TOTAL KNEE ARTHROPLASTY Left 08/05/2020   Procedure: TOTAL KNEE ARTHROPLASTY;  Surgeon: Teryl Lucy, MD;  Location: [facility];  Service: Orthopedics;  Laterality: Left;   WISDOM TOOTH EXTRACTION     age 75    There were no vitals filed for this visit.   Subjective Assessment - 09/15/20 1108     Subjective Patient teports she had to help a friend so she was on her feet a lot.    Patient is accompained by: Family member    Pertinent History right total knee in the past, anxiety, depression    Limitations Standing;Walking;Lifting    How long can you sit comfortably? n/a    How long can you stand comfortably? 10-15 mins    How long can you walk  comfortably? limited distance,    Patient Stated Goals improve strenght, endurance,    Currently in Pain? No/denies    Pain Score 0-No pain    Pain Location Knee    Pain Orientation Left    Pain Descriptors / Indicators Aching    Pain Type Chronic pain    Pain Onset 1 to 4 weeks ago    Pain Frequency Constant    Aggravating Factors  prolonged walking and standing.    Pain Relieving Factors rest and medication    Effect of Pain on Daily Activities difficuly walking  and long distances    Multiple Pain Sites No                [facility] PT Assessment - 09/15/20 0001       AROM   Left Knee Extension 0    Left Knee Flexion 120                           [facility] Adult PT Treatment/Exercise - 09/15/20 0001       Knee/Hip Exercises: Standing   Heel Raises Limitations 2x20    Lateral Step Up Limitations 2x15 4 inch    Forward Step Up Limitations  2x15 4 inch    Functional Squat Limitations x15    Other Standing Knee Exercises Slow March 2x15      Knee/Hip Exercises: Seated   Long Arc Quad Limitations 20x   2lb     Knee/Hip Exercises: Supine   Bridges with [employer] Strengthening;15 reps    Straight Leg Raises Limitations 3x10   1lb     Modalities   Modalities Cryotherapy      Cryotherapy   Number Minutes Cryotherapy 10 Minutes    Cryotherapy Location Knee    Type of Cryotherapy Ice pack      Manual Therapy   Manual Therapy Joint mobilization    Joint Mobilization L knee flexion mob 90 deg, posterior tib grade III    Passive ROM full range noted                    PT Education - 09/15/20 1139     Education Details reviewed HEP and symptom mangement    Person(s) Educated Patient    Methods Explanation;Demonstration;Tactile cues;Verbal cues    Comprehension Verbalized understanding;Returned demonstration;Verbal cues required;Tactile cues required              PT Short Term Goals - 08/19/20 1704       PT SHORT TERM GOAL #1    Title Patient will report decrease pain to less than 3/10.    Baseline 5/10    Time 4    Period Weeks    Status New    Target Date 09/17/20      PT SHORT TERM GOAL #2   Title Patient will improve hip flexion strength to 4+/5 in left knee in order to increase walking distance.    Baseline 4/5    Time 4    Period Weeks    Status New    Target Date 09/17/20      PT SHORT TERM GOAL #3   Title Patient will demonstrate 110 degrees on the left    Baseline 110    Time 4    Period Weeks    Status New    Target Date 09/17/20               PT Long Term Goals - 08/19/20 1708       PT LONG TERM GOAL #1   Title Patient will improve 5x sit to stand to under 13 seconds in order to decrease risk of falls during ambulation.    Baseline 17 secs    Time 8    Period Weeks    Status New    Target Date 10/15/20      PT LONG TERM GOAL #2   Title Patient will show 5/5 strength in left knee in order to ambulate around community safely.    Baseline 4/5 knee flexion/extension, 4/5 hip flexion.    Time 8    Period Weeks    Status New    Target Date 10/15/20      PT LONG TERM GOAL #3   Title Patient will be able have active range of motion in left and knee atleast 95% equal in order to safely perform ADL's with appropriate mechanics.    Baseline LExt: -8 LFlex: 96 RExt: 0  RFlex: 110    Time 8    Period Weeks    Status New    Target Date 10/15/20      PT LONG TERM GOAL #4   Title Pt will be able to tolerate  standing at least 30 minutes for improved home and community mobility.    Baseline Limited 5-10 mins at moment.    Time 4    Period Weeks    Status New    Target Date 09/16/20                   Plan - 09/15/20 1140     Clinical Impression Statement Patient was fatigued prior to treatment today. Her knee was mildly inflamed. Therapy kept her treatment a little eaier then last visit. She had no increase in pain or swelling. Her range remains good. She will continue  1W4 after this week. We will continue to work on steps, squats, and endurance with exercises.    Personal Factors and Comorbidities Age;Fitness;Past/Current Experience;Time since onset of injury/illness/exacerbation    Examination-Activity Limitations Transfers;Locomotion Level;Stairs;Stand;Hygiene/Grooming;Other    Examination-Participation Restrictions Community Activity;Driving;Cleaning    Stability/Clinical Decision Making Stable/Uncomplicated    Clinical Decision Making Low    Rehab Potential Excellent    PT Frequency 2x / week    PT Duration 8 weeks    PT Treatment/Interventions ADLs/Self Care Home Management;Therapeutic exercise;Aquatic Therapy;Cryotherapy;Iontophoresis 4mg /ml Dexamethasone;Moist Heat;Ultrasound;DME Instruction;Electrical Stimulation;Gait training;Stair training;Functional mobility training;Therapeutic activities;Balance training;Neuromuscular re-education;Manual techniques;Patient/family education;Orthotic Fit/Training;Scar mobilization;Dry needling;Energy conservation;Taping;Vasopneumatic Device    PT Next Visit Plan balance progression, extensor strength progression    PT Home Exercise Plan Access Code: [email or web address]    Consulted and Agree with Plan of Care Patient             Patient will benefit from skilled therapeutic intervention in order to improve the following deficits and impairments:  Abnormal gait, Increased fascial restricitons, Pain, Decreased mobility, Decreased activity tolerance, Decreased range of motion, Decreased strength, Hypomobility, Decreased balance, Difficulty walking, Increased edema, Decreased endurance, Impaired flexibility, Decreased scar mobility  Visit Diagnosis: Acute pain of left knee  Muscle weakness (generalized)  Stiffness of left knee, not elsewhere classified  Difficulty walking     Problem List Patient Active Problem List   Diagnosis Date Noted   S/P TKR (total knee replacement), left 08/05/2020   Skin infection  05/28/2020   Wound of skin 05/28/2020   S/P knee surgery 05/28/2020   Eczematous dermatitis of upper eyelids of both eyes 05/28/2020   Osteoarthritis of left knee 05/27/2020   S/P total knee replacement, right 04/15/2020   Osteopenia 04/10/2020   Major depressive disorder, recurrent episode, mild (HCC) 10/31/2017   ADHD, predominantly inattentive type 10/31/2017   Primary osteoarthritis of right knee 09/09/2016   Primary osteoarthritis, right shoulder 09/09/2016   Arthritis 07/25/2012   Tinnitus 07/25/2012   Mixed incontinence urge and stress 07/25/2012    09/25/2012 PT DPT  09/15/2020, 12:05 PM  [facility] [address] [place], [place], [place] Phone: [phone]   Fax:  [phone]  Name: Megan Nunez MRN: [name] Date of Birth: 1945/01/24

## 2020-09-18 ENCOUNTER — Encounter (HOSPITAL_BASED_OUTPATIENT_CLINIC_OR_DEPARTMENT_OTHER): Payer: Medicare Other | Admitting: Physical Therapy

## 2020-09-19 DIAGNOSIS — M25512 Pain in left shoulder: Secondary | ICD-10-CM | POA: Diagnosis not present

## 2020-09-19 DIAGNOSIS — M25511 Pain in right shoulder: Secondary | ICD-10-CM | POA: Diagnosis not present

## 2020-09-24 ENCOUNTER — Ambulatory Visit (HOSPITAL_BASED_OUTPATIENT_CLINIC_OR_DEPARTMENT_OTHER): Payer: Medicare Other | Attending: Orthopedic Surgery | Admitting: Physical Therapy

## 2020-09-24 ENCOUNTER — Encounter (HOSPITAL_BASED_OUTPATIENT_CLINIC_OR_DEPARTMENT_OTHER): Payer: Self-pay | Admitting: Physical Therapy

## 2020-09-24 ENCOUNTER — Other Ambulatory Visit: Payer: Self-pay

## 2020-09-24 DIAGNOSIS — R2689 Other abnormalities of gait and mobility: Secondary | ICD-10-CM | POA: Diagnosis not present

## 2020-09-24 DIAGNOSIS — R262 Difficulty in walking, not elsewhere classified: Secondary | ICD-10-CM | POA: Diagnosis not present

## 2020-09-24 DIAGNOSIS — M6281 Muscle weakness (generalized): Secondary | ICD-10-CM | POA: Diagnosis not present

## 2020-09-24 DIAGNOSIS — G8929 Other chronic pain: Secondary | ICD-10-CM | POA: Diagnosis not present

## 2020-09-24 DIAGNOSIS — M25562 Pain in left knee: Secondary | ICD-10-CM | POA: Diagnosis not present

## 2020-09-24 DIAGNOSIS — M25512 Pain in left shoulder: Secondary | ICD-10-CM | POA: Insufficient documentation

## 2020-09-24 DIAGNOSIS — M25662 Stiffness of left knee, not elsewhere classified: Secondary | ICD-10-CM | POA: Insufficient documentation

## 2020-09-24 DIAGNOSIS — M25511 Pain in right shoulder: Secondary | ICD-10-CM | POA: Diagnosis not present

## 2020-09-25 ENCOUNTER — Encounter (HOSPITAL_BASED_OUTPATIENT_CLINIC_OR_DEPARTMENT_OTHER): Payer: Self-pay | Admitting: Physical Therapy

## 2020-09-25 NOTE — Therapy (Signed)
[facility] [address] [place], [place], [address] Phone: [phone]   Fax:  [phone]  Physical Therapy Treatment  Patient Details  Name: Megan Nunez MRN: [id number] Date of Birth: July 12, 1945 Referring Provider (PT): Teryl Lucy, MD   Encounter Date: 09/24/2020   PT End of Session - 09/25/20 1945     Visit Number 10    Number of Visits 22    Date for PT Re-Evaluation 11/06/20    Authorization Type U[employer] Medicare apply KX 2nd to previous therapy visits    PT Start Time 1015    PT Stop Time 1058    PT Time Calculation (min) 43 min    Activity Tolerance Patient tolerated treatment well    Behavior During Therapy [facility] for tasks assessed/performed             Past Medical History:  Diagnosis Date   Anxiety    Arthritis    Depression    Family history of adverse reaction to anesthesia 2012   Mother has cardiac  arrest after surgery   Hepatitis    high antibodies for hepatitis   Obesity     Past Surgical History:  Procedure Laterality Date   LAPAROSCOPIC TUBAL LIGATION  1985   TOTAL KNEE ARTHROPLASTY Right 04/15/2020   Procedure: TOTAL KNEE ARTHROPLASTY;  Surgeon: Teryl Lucy, MD;  Location: [facility];  Service: Orthopedics;  Laterality: Right;   TOTAL KNEE ARTHROPLASTY Left 08/05/2020   Procedure: TOTAL KNEE ARTHROPLASTY;  Surgeon: Teryl Lucy, MD;  Location: [facility];  Service: Orthopedics;  Laterality: Left;   WISDOM TOOTH EXTRACTION     age 75    There were no vitals filed for this visit.       [facility] PT Assessment - 09/25/20 0001       Assessment   Medical Diagnosis Left knee pain    Referring Provider (PT) Teryl Lucy, MD      Circumferential Edema   Circumferential - Right 37    Circumferential - Left  38      AROM   Left Knee Extension 0    Left Knee Flexion 118      Strength   Right Hip Flexion 5/5    Right Hip ABduction 5/5    Left Hip Flexion 4+/5    Left Hip ABduction 5/5     Left Hip ADduction 5/5    Right Knee Flexion 5/5    Right Knee Extension 5/5    Left Knee Flexion 4+/5    Left Knee Extension 4+/5                           [facility] Adult PT Treatment/Exercise - 09/25/20 0001       Knee/Hip Exercises: Aerobic   Nustep 5 min L4      Knee/Hip Exercises: Standing   Heel Raises Limitations 2x20    Lateral Step Up Limitations 2x15 4 inch    Forward Step Up Limitations 2x15 4 inch    Step Down Limitations 2x10 4 inch    Functional Squat Limitations x15    Other Standing Knee Exercises Slow March 2x15      Knee/Hip Exercises: Seated   Long Arc Quad Limitations 20x   2lb     Knee/Hip Exercises: Supine   Bridges with Ball Squeeze Strengthening;15 reps;2 sets    Straight Leg Raises Limitations 3x10   2 lb     Modalities   Modalities  Cryotherapy      Cryotherapy   Number Minutes Cryotherapy 10 Minutes    Cryotherapy Location Knee    Type of Cryotherapy Ice pack      Manual Therapy   Manual Therapy Joint mobilization    Joint Mobilization L knee flexion mob 90 deg, posterior tib grade III    Passive ROM full range noted                     PT Education - 09/25/20 1944     Education Details reviewed progression of steps    Person(s) Educated Patient    Methods Explanation;Demonstration;Tactile cues;Verbal cues    Comprehension Verbalized understanding;Returned demonstration;Verbal cues required;Tactile cues required              PT Short Term Goals - 09/25/20 1953       PT SHORT TERM GOAL #1   Title Patient will report decrease pain to less than 3/10.    Baseline minor pain time to time    Time 4    Period Weeks    Status Achieved    Target Date 10/23/20      PT SHORT TERM GOAL #2   Title Patient will improve hip flexion strength to 4+/5 in left knee in order to increase walking distance.    Baseline 4+/5    Time 4    Period Weeks    Status Achieved      PT SHORT TERM GOAL #3   Title Patient  will demonstrate 110 degrees on the left    Baseline 0-120    Time 4    Period Weeks    Status Achieved    Target Date 10/23/20               PT Long Term Goals - 09/25/20 1954       PT LONG TERM GOAL #1   Title Patient will improve 5x sit to stand to under 13 seconds in order to decrease risk of falls during ambulation.    Baseline not tested this visit    Time 8    Period Weeks    Status On-going      PT LONG TERM GOAL #2   Title Patient will show 5/5 strength in left knee in order to ambulate around community safely.    Baseline mild limitations in hip flexion and knee extension    Time 8    Period Weeks    Status On-going      PT LONG TERM GOAL #3   Title Patient will be able have active range of motion in left and knee atleast 95% equal in order to safely perform ADL's with appropriate mechanics.    Baseline full range    Time 8    Period Weeks    Status Achieved      PT LONG TERM GOAL #4   Title Pt will be able to tolerate standing at least 30 minutes for improved home and community mobility.    Baseline improved but not to 30 min yet    Time 8    Period Weeks    Status On-going                   Plan - 09/25/20 1951     Clinical Impression Statement Patient tolerated treatment well today. She tolerated increased height with step downs. Therapy also advanced her to a 2lb weigh twith LAQ and SLR. She is making great progress.  She is having very little pain. Her range is near ful Patient would benefit from continued therapy 2W6 to continue to work on functional activty sch as squats and steps. We will likely reduce visits to 1x a week soon.    Personal Factors and Comorbidities Age;Fitness;Past/Current Experience;Time since onset of injury/illness/exacerbation    Examination-Activity Limitations Transfers;Locomotion Level;Stairs;Stand;Hygiene/Grooming;Other    Examination-Participation Restrictions Community Activity;Driving;Cleaning     Stability/Clinical Decision Making Stable/Uncomplicated    Clinical Decision Making Low    Rehab Potential Excellent    PT Frequency 2x / week    PT Duration 6 weeks    PT Treatment/Interventions ADLs/Self Care Home Management;Therapeutic exercise;Aquatic Therapy;Cryotherapy;Iontophoresis 4mg /ml Dexamethasone;Moist Heat;Ultrasound;DME Instruction;Electrical Stimulation;Gait training;Stair training;Functional mobility training;Therapeutic activities;Balance training;Neuromuscular re-education;Manual techniques;Patient/family education;Orthotic Fit/Training;Scar mobilization;Dry needling;Energy conservation;Taping;Vasopneumatic Device    PT Next Visit Plan balance progression, extensor strength progression    PT Home Exercise Plan Access Code: [email or web address]    Consulted and Agree with Plan of Care Patient    Family Member Consulted Husband             Patient will benefit from skilled therapeutic intervention in order to improve the following deficits and impairments:  Abnormal gait, Increased fascial restricitons, Pain, Decreased mobility, Decreased activity tolerance, Decreased range of motion, Decreased strength, Hypomobility, Decreased balance, Difficulty walking, Increased edema, Decreased endurance, Impaired flexibility, Decreased scar mobility  Visit Diagnosis: Acute pain of left knee  Muscle weakness (generalized)  Stiffness of left knee, not elsewhere classified  Difficulty walking  Chronic pain of left knee  Other abnormalities of gait and mobility     Problem List Patient Active Problem List   Diagnosis Date Noted   S/P TKR (total knee replacement), left 08/05/2020   Skin infection 05/28/2020   Wound of skin 05/28/2020   S/P knee surgery 05/28/2020   Eczematous dermatitis of upper eyelids of both eyes 05/28/2020   Osteoarthritis of left knee 05/27/2020   S/P total knee replacement, right 04/15/2020   Osteopenia 04/10/2020   Major depressive disorder, recurrent  episode, mild (HCC) 10/31/2017   ADHD, predominantly inattentive type 10/31/2017   Primary osteoarthritis of right knee 09/09/2016   Primary osteoarthritis, right shoulder 09/09/2016   Arthritis 07/25/2012   Tinnitus 07/25/2012   Mixed incontinence urge and stress 07/25/2012    09/25/2012, PT 09/25/2020, 8:01 PM  [facility] [address] [place], [place], [place] Phone: [phone]   Fax:  [phone]  Name: Megan Nunez MRN: [name] Date of Birth: 01-30-1945

## 2020-10-01 ENCOUNTER — Other Ambulatory Visit: Payer: Self-pay

## 2020-10-01 ENCOUNTER — Encounter (HOSPITAL_BASED_OUTPATIENT_CLINIC_OR_DEPARTMENT_OTHER): Payer: Self-pay | Admitting: Physical Therapy

## 2020-10-01 ENCOUNTER — Ambulatory Visit (HOSPITAL_BASED_OUTPATIENT_CLINIC_OR_DEPARTMENT_OTHER): Payer: Medicare Other | Admitting: Physical Therapy

## 2020-10-01 DIAGNOSIS — M25662 Stiffness of left knee, not elsewhere classified: Secondary | ICD-10-CM | POA: Diagnosis not present

## 2020-10-01 DIAGNOSIS — M25511 Pain in right shoulder: Secondary | ICD-10-CM | POA: Diagnosis not present

## 2020-10-01 DIAGNOSIS — M6281 Muscle weakness (generalized): Secondary | ICD-10-CM

## 2020-10-01 DIAGNOSIS — R262 Difficulty in walking, not elsewhere classified: Secondary | ICD-10-CM

## 2020-10-01 DIAGNOSIS — M25562 Pain in left knee: Secondary | ICD-10-CM

## 2020-10-01 DIAGNOSIS — M25512 Pain in left shoulder: Secondary | ICD-10-CM | POA: Diagnosis not present

## 2020-10-01 DIAGNOSIS — R2689 Other abnormalities of gait and mobility: Secondary | ICD-10-CM | POA: Diagnosis not present

## 2020-10-01 DIAGNOSIS — G8929 Other chronic pain: Secondary | ICD-10-CM | POA: Diagnosis not present

## 2020-10-01 NOTE — Therapy (Signed)
[facility] [address] [place], [place], [address] Phone: [phone]   Fax:  [phone]  Physical Therapy Treatment  Patient Details  Name: Megan Nunez MRN: [id number] Date of Birth: 11-May-1945 Referring Provider (PT): Teryl Lucy, MD   Encounter Date: 10/01/2020   PT End of Session - 10/01/20 1047     Visit Number 11    Number of Visits 22    Date for PT Re-Evaluation 11/06/20    Authorization Type [employer] Medicare apply KX 2nd to previous therapy visits    PT Start Time 1029   Patient 19 minutes late   PT Stop Time 1100    PT Time Calculation (min) 31 min    Activity Tolerance Patient tolerated treatment well    Behavior During Therapy [facility] for tasks assessed/performed             Past Medical History:  Diagnosis Date   Anxiety    Arthritis    Depression    Family history of adverse reaction to anesthesia 2012   Mother has cardiac  arrest after surgery   Hepatitis    high antibodies for hepatitis   Obesity     Past Surgical History:  Procedure Laterality Date   LAPAROSCOPIC TUBAL LIGATION  1985   TOTAL KNEE ARTHROPLASTY Right 04/15/2020   Procedure: TOTAL KNEE ARTHROPLASTY;  Surgeon: Teryl Lucy, MD;  Location: [facility];  Service: Orthopedics;  Laterality: Right;   TOTAL KNEE ARTHROPLASTY Left 08/05/2020   Procedure: TOTAL KNEE ARTHROPLASTY;  Surgeon: Teryl Lucy, MD;  Location: [facility];  Service: Orthopedics;  Laterality: Left;   WISDOM TOOTH EXTRACTION     age 75    There were no vitals filed for this visit.   Subjective Assessment - 10/01/20 1043     Subjective Patient reports her right shoulder has been painful. Her knee has been doing about the same. She feels like it is a little stiff today.    Patient is accompained by: Family member    Pertinent History right total knee in the past, anxiety, depression    Limitations Standing;Walking;Lifting    How long can you sit comfortably? n/a     How long can you stand comfortably? 10-15 mins    How long can you walk comfortably? limited distance,    Patient Stated Goals improve strenght, endurance,    Currently in Pain? No/denies                [facility] PT Assessment - 10/01/20 0001       Assessment   Medical Diagnosis Left knee pain    Referring Provider (PT) Teryl Lucy, MD      Circumferential Edema   Circumferential - Right 37    Circumferential - Left  38      AROM   Left Knee Extension 0    Left Knee Flexion 118      Strength   Right Hip Flexion 5/5    Right Hip ABduction 5/5    Left Hip Flexion 4+/5    Left Hip ABduction 5/5    Left Hip ADduction 5/5    Right Knee Flexion 5/5    Right Knee Extension 5/5    Left Knee Flexion 4+/5    Left Knee Extension 4+/5                           [facility] Adult PT Treatment/Exercise - 10/01/20 0001  Knee/Hip Exercises: Aerobic   Nustep 5 min L4      Knee/Hip Exercises: Standing   Heel Raises Limitations 2x20    Lateral Step Up Limitations 2x15 4 inch    Forward Step Up Limitations 2x15 4 inch    Step Down Limitations 2x10 4 inch    Functional Squat Limitations x15    Other Standing Knee Exercises Slow March 2x15      Knee/Hip Exercises: Seated   Long Arc Quad Limitations 20x   2lb     Knee/Hip Exercises: Supine   Bridges with [employer] Strengthening;15 reps;2 sets    Straight Leg Raises Limitations 3x10   2 lb     Modalities   Modalities Cryotherapy      Cryotherapy   Number Minutes Cryotherapy 10 Minutes    Cryotherapy Location Knee    Type of Cryotherapy Ice pack      Manual Therapy   Manual Therapy Joint mobilization    Joint Mobilization L knee flexion mob 90 deg, posterior tib grade III    Passive ROM full range noted                     PT Education - 10/01/20 1044     Education Details reviewed HEP and syptom management    Person(s) Educated Patient    Methods Explanation;Demonstration;Tactile  cues;Verbal cues    Comprehension Verbalized understanding;Verbal cues required;Returned demonstration;Tactile cues required              PT Short Term Goals - 09/25/20 1953       PT SHORT TERM GOAL #1   Title Patient will report decrease pain to less than 3/10.    Baseline minor pain time to time    Time 4    Period Weeks    Status Achieved    Target Date 10/23/20      PT SHORT TERM GOAL #2   Title Patient will improve hip flexion strength to 4+/5 in left knee in order to increase walking distance.    Baseline 4+/5    Time 4    Period Weeks    Status Achieved      PT SHORT TERM GOAL #3   Title Patient will demonstrate 110 degrees on the left    Baseline 0-120    Time 4    Period Weeks    Status Achieved    Target Date 10/23/20               PT Long Term Goals - 09/25/20 1954       PT LONG TERM GOAL #1   Title Patient will improve 5x sit to stand to under 13 seconds in order to decrease risk of falls during ambulation.    Baseline not tested this visit    Time 8    Period Weeks    Status On-going      PT LONG TERM GOAL #2   Title Patient will show 5/5 strength in left knee in order to ambulate around community safely.    Baseline mild limitations in hip flexion and knee extension    Time 8    Period Weeks    Status On-going      PT LONG TERM GOAL #3   Title Patient will be able have active range of motion in left and knee atleast 95% equal in order to safely perform ADL's with appropriate mechanics.    Baseline full range    Time 8  Period Weeks    Status Achieved      PT LONG TERM GOAL #4   Title Pt will be able to tolerate standing at least 30 minutes for improved home and community mobility.    Baseline improved but not to 30 min yet    Time 8    Period Weeks    Status On-going                   Plan - 10/01/20 1057     Clinical Impression Statement Patient was liited by time today. She is trying to get a script for her  shoulder. We did not have it yet. Her motion was more limited today but its still doing well. She had improved end range pain with manual therapy. Therapy continues to focus on stari training.    Personal Factors and Comorbidities Age;Fitness;Past/Current Experience;Time since onset of injury/illness/exacerbation    Examination-Activity Limitations Transfers;Locomotion Level;Stairs;Stand;Hygiene/Grooming;Other    Examination-Participation Restrictions Community Activity;Driving;Cleaning    Stability/Clinical Decision Making Stable/Uncomplicated    Clinical Decision Making Low    Rehab Potential Excellent    PT Frequency 2x / week    PT Duration 6 weeks    PT Treatment/Interventions ADLs/Self Care Home Management;Therapeutic exercise;Aquatic Therapy;Cryotherapy;Iontophoresis 4mg /ml Dexamethasone;Moist Heat;Ultrasound;DME Instruction;Electrical Stimulation;Gait training;Stair training;Functional mobility training;Therapeutic activities;Balance training;Neuromuscular re-education;Manual techniques;Patient/family education;Orthotic Fit/Training;Scar mobilization;Dry needling;Energy conservation;Taping;Vasopneumatic Device    PT Next Visit Plan balance progression, extensor strength progression    PT Home Exercise Plan Access Code: [email or web address]    Consulted and Agree with Plan of Care Patient    Family Member Consulted Husband             Patient will benefit from skilled therapeutic intervention in order to improve the following deficits and impairments:  Abnormal gait, Increased fascial restricitons, Pain, Decreased mobility, Decreased activity tolerance, Decreased range of motion, Decreased strength, Hypomobility, Decreased balance, Difficulty walking, Increased edema, Decreased endurance, Impaired flexibility, Decreased scar mobility  Visit Diagnosis: Acute pain of left knee  Muscle weakness (generalized)  Stiffness of left knee, not elsewhere classified  Difficulty walking     Problem  List Patient Active Problem List   Diagnosis Date Noted   S/P TKR (total knee replacement), left 08/05/2020   Skin infection 05/28/2020   Wound of skin 05/28/2020   S/P knee surgery 05/28/2020   Eczematous dermatitis of upper eyelids of both eyes 05/28/2020   Osteoarthritis of left knee 05/27/2020   S/P total knee replacement, right 04/15/2020   Osteopenia 04/10/2020   Major depressive disorder, recurrent episode, mild (HCC) 10/31/2017   ADHD, predominantly inattentive type 10/31/2017   Primary osteoarthritis of right knee 09/09/2016   Primary osteoarthritis, right shoulder 09/09/2016   Arthritis 07/25/2012   Tinnitus 07/25/2012   Mixed incontinence urge and stress 07/25/2012    09/25/2012, PT 10/01/2020, 8:51 PM  [facility] [address] [place], [place], [place] Phone: [phone]   Fax:  [phone]  Name: LAMIKA CONNOLLY MRN: [name] Date of Birth: 01/21/1945

## 2020-10-08 ENCOUNTER — Ambulatory Visit (HOSPITAL_BASED_OUTPATIENT_CLINIC_OR_DEPARTMENT_OTHER): Payer: Medicare Other | Admitting: Physical Therapy

## 2020-10-08 ENCOUNTER — Encounter (HOSPITAL_BASED_OUTPATIENT_CLINIC_OR_DEPARTMENT_OTHER): Payer: Self-pay | Admitting: Physical Therapy

## 2020-10-08 ENCOUNTER — Other Ambulatory Visit: Payer: Self-pay

## 2020-10-08 DIAGNOSIS — R2689 Other abnormalities of gait and mobility: Secondary | ICD-10-CM | POA: Diagnosis not present

## 2020-10-08 DIAGNOSIS — R262 Difficulty in walking, not elsewhere classified: Secondary | ICD-10-CM | POA: Diagnosis not present

## 2020-10-08 DIAGNOSIS — M25511 Pain in right shoulder: Secondary | ICD-10-CM

## 2020-10-08 DIAGNOSIS — G8929 Other chronic pain: Secondary | ICD-10-CM | POA: Diagnosis not present

## 2020-10-08 DIAGNOSIS — M25562 Pain in left knee: Secondary | ICD-10-CM

## 2020-10-08 DIAGNOSIS — M25662 Stiffness of left knee, not elsewhere classified: Secondary | ICD-10-CM | POA: Diagnosis not present

## 2020-10-08 DIAGNOSIS — M25512 Pain in left shoulder: Secondary | ICD-10-CM | POA: Diagnosis not present

## 2020-10-08 DIAGNOSIS — M6281 Muscle weakness (generalized): Secondary | ICD-10-CM

## 2020-10-08 NOTE — Therapy (Signed)
[facility] [address] [place], [place], [address] Phone: [phone]   Fax:  [phone]  Physical Therapy Treatment  Patient Details  Name: Megan Nunez MRN: [id number] Date of Birth: 03-Apr-1945 Referring Provider (PT): Teryl Lucy, MD   Encounter Date: 10/08/2020   PT End of Session - 10/08/20 1028     Visit Number 12    Number of Visits 24    Date for PT Re-Evaluation 11/19/20    Authorization Type [employer] Medicare apply KX 2nd to previous therapy visits    PT Start Time 1015    PT Stop Time 1058    PT Time Calculation (min) 43 min    Activity Tolerance Patient tolerated treatment well    Behavior During Therapy [facility] for tasks assessed/performed             Past Medical History:  Diagnosis Date   Anxiety    Arthritis    Depression    Family history of adverse reaction to anesthesia 2012   Mother has cardiac  arrest after surgery   Hepatitis    high antibodies for hepatitis   Obesity     Past Surgical History:  Procedure Laterality Date   LAPAROSCOPIC TUBAL LIGATION  1985   TOTAL KNEE ARTHROPLASTY Right 04/15/2020   Procedure: TOTAL KNEE ARTHROPLASTY;  Surgeon: Teryl Lucy, MD;  Location: [facility];  Service: Orthopedics;  Laterality: Right;   TOTAL KNEE ARTHROPLASTY Left 08/05/2020   Procedure: TOTAL KNEE ARTHROPLASTY;  Surgeon: Teryl Lucy, MD;  Location: [facility];  Service: Orthopedics;  Laterality: Left;   WISDOM TOOTH EXTRACTION     age 75    There were no vitals filed for this visit.   Subjective Assessment - 10/08/20 1022     Subjective Patient has been having left shoulder pain since June. The right shoulder has been hurting her for year. She will have a right shoulder replacmenet on the right some time in the futre. She would like to work on her left shoulder at this time to prevent the need for suregry. She ahs increased pain lifting her left shoulder past 120 degrees. She also has increased  pain with extension and functional IR. The atients knee has no been painful, but it has been a little stiff.    Pertinent History right total knee in the past, anxiety, depression    Limitations Standing;Walking;Lifting    How long can you sit comfortably? n/a    How long can you stand comfortably? 10-15 mins    How long can you walk comfortably? limited distance,    Patient Stated Goals improve strenght, endurance,    Currently in Pain? Yes    Pain Score 1     Pain Location Shoulder    Pain Orientation Right    Pain Descriptors / Indicators Aching    Pain Type Chronic pain    Pain Onset 1 to 4 weeks ago    Pain Frequency Intermittent    Aggravating Factors  use of the arm; reaching overhead    Pain Relieving Factors rest and miedication    Effect of Pain on Daily Activities difficulty using her arms for functional tasks                [facility] PT Assessment - 10/08/20 0001       Assessment   Medical Diagnosis Left knee pain    Referring Provider (PT) Teryl Lucy, MD      AROM   AROM Assessment  Site Shoulder    Right/Left Shoulder Right;Left    Right Shoulder Flexion 88 Degrees    Right Shoulder Internal Rotation --   unable   Left Shoulder Flexion 120 Degrees   with pain   Left Shoulder Internal Rotation --   painful to belt line   Left Shoulder External Rotation --   can reach lower occiput with pain   Left Knee Extension 0    Left Knee Flexion 120      PROM   Overall PROM Comments full PROM of the left shoulder with pain at end range. Significant crepitus of the right shoulder      Strength   Strength Assessment Site Shoulder    Right/Left Shoulder Right;Left    Right Shoulder Flexion 3+/5    Right Shoulder External Rotation 3+/5    Right Shoulder Horizontal ABduction 3+/5    Left Shoulder Flexion 4/5    Left Shoulder Internal Rotation 4/5    Left Shoulder External Rotation 4/5    Right Hip Flexion 5/5    Right Hip Extension 5/5    Right Hip ABduction 5/5     Left Hip Flexion 4+/5    Left Hip Internal Rotation 5/5    Right Knee Flexion 5/5    Right Knee Extension 5/5    Left Knee Flexion 5/5    Left Knee Extension 4+/5      Palpation   Palpation comment tender to palpation on joint line, medial and lateral knee but tolerable.                           [facility] Adult PT Treatment/Exercise - 10/08/20 0001       Knee/Hip Exercises: Aerobic   Nustep 5 min L4      Knee/Hip Exercises: Standing   Heel Raises Limitations 2x20    Lateral Step Up Limitations 2x15 4 inch    Forward Step Up Limitations 2x15 4 inch    Step Down Limitations 2x10 4 inch    Functional Squat Limitations x15    Other Standing Knee Exercises Slow March 2x15      Knee/Hip Exercises: Seated   Long Arc Quad Limitations 20x   2lb     Knee/Hip Exercises: Supine   Bridges with [employer] Strengthening;15 reps;2 sets    Straight Leg Raises Limitations 2x15   2 lb     Shoulder Exercises: Seated   Other Seated Exercises scap retraction x20      Shoulder Exercises: Pulleys   Flexion Limitations 2 min      Shoulder Exercises: Stretch   Other Shoulder Stretches left upper trap stretch 3x20 sec hold; levato stretch 3x20 sec hold      Modalities   Modalities Cryotherapy      Cryotherapy   Cryotherapy Location Knee      Manual Therapy   Manual Therapy Joint mobilization    Joint Mobilization L knee flexion mob 90 deg, posterior tib grade III    Soft tissue mobilization to upper trap    Passive ROM full range noted                     PT Education - 10/08/20 1025     Education Details reviewed HEP for her shoulders.    Person(s) Educated Patient    Methods Explanation;Demonstration;Tactile cues;Verbal cues    Comprehension Returned demonstration;Verbal cues required;Tactile cues required;Verbalized understanding  PT Short Term Goals - 09/25/20 1953       PT SHORT TERM GOAL #1   Title Patient will report  decrease pain to less than 3/10.    Baseline minor pain time to time    Time 4    Period Weeks    Status Achieved    Target Date 10/23/20      PT SHORT TERM GOAL #2   Title Patient will improve hip flexion strength to 4+/5 in left knee in order to increase walking distance.    Baseline 4+/5    Time 4    Period Weeks    Status Achieved      PT SHORT TERM GOAL #3   Title Patient will demonstrate 110 degrees on the left    Baseline 0-120    Time 4    Period Weeks    Status Achieved    Target Date 10/23/20               PT Long Term Goals - 09/25/20 1954       PT LONG TERM GOAL #1   Title Patient will improve 5x sit to stand to under 13 seconds in order to decrease risk of falls during ambulation.    Baseline not tested this visit    Time 8    Period Weeks    Status On-going      PT LONG TERM GOAL #2   Title Patient will show 5/5 strength in left knee in order to ambulate around community safely.    Baseline mild limitations in hip flexion and knee extension    Time 8    Period Weeks    Status On-going      PT LONG TERM GOAL #3   Title Patient will be able have active range of motion in left and knee atleast 95% equal in order to safely perform ADL's with appropriate mechanics.    Baseline full range    Time 8    Period Weeks    Status Achieved      PT LONG TERM GOAL #4   Title Pt will be able to tolerate standing at least 30 minutes for improved home and community mobility.    Baseline improved but not to 30 min yet    Time 8    Period Weeks    Status On-going                   Plan - 10/08/20 1046     Clinical Impression Statement Patient has limited end range flexion and strength on the left ide. Sh eis more limited on the right. On the right she had significant crepitus on the right and limited movement. She will have that shouldr reeplceed. She had spasming on the left in the levator and upper trap. She was given stretches for theses muscles.  She was shown how to use pulleys which may be helpful for both sides. Patient worked on instability with her knee as well as steps. Her knee is progressing well towards discharge soon.    Personal Factors and Comorbidities Age;Fitness;Past/Current Experience;Time since onset of injury/illness/exacerbation    Examination-Activity Limitations Transfers;Locomotion Level;Stairs;Stand;Hygiene/Grooming;Other    Stability/Clinical Decision Making Stable/Uncomplicated    Clinical Decision Making Low    PT Frequency 2x / week    PT Duration 6 weeks    PT Treatment/Interventions ADLs/Self Care Home Management;Therapeutic exercise;Aquatic Therapy;Cryotherapy;Iontophoresis 4mg /ml Dexamethasone;Moist Heat;Ultrasound;DME Instruction;Electrical Stimulation;Gait training;Stair training;Functional mobility training;Therapeutic activities;Balance training;Neuromuscular re-education;Manual techniques;Patient/family education;Orthotic  Fit/Training;Scar mobilization;Dry needling;Energy conservation;Taping;Vasopneumatic Device    PT Next Visit Plan balance progression, extensor strength progression    PT Home Exercise Plan Access Code: [email or web address]             Patient will benefit from skilled therapeutic intervention in order to improve the following deficits and impairments:  Abnormal gait, Increased fascial restricitons, Pain, Decreased mobility, Decreased activity tolerance, Decreased range of motion, Decreased strength, Hypomobility, Decreased balance, Difficulty walking, Increased edema, Decreased endurance, Impaired flexibility, Decreased scar mobility  Visit Diagnosis: Acute pain of left knee  Muscle weakness (generalized)  Difficulty walking  Stiffness of left knee, not elsewhere classified  Chronic left shoulder pain  Chronic right shoulder pain     Problem List Patient Active Problem List   Diagnosis Date Noted   S/P TKR (total knee replacement), left 08/05/2020   Skin infection 05/28/2020    Wound of skin 05/28/2020   S/P knee surgery 05/28/2020   Eczematous dermatitis of upper eyelids of both eyes 05/28/2020   Osteoarthritis of left knee 05/27/2020   S/P total knee replacement, right 04/15/2020   Osteopenia 04/10/2020   Major depressive disorder, recurrent episode, mild (HCC) 10/31/2017   ADHD, predominantly inattentive type 10/31/2017   Primary osteoarthritis of right knee 09/09/2016   Primary osteoarthritis, right shoulder 09/09/2016   Arthritis 07/25/2012   Tinnitus 07/25/2012   Mixed incontinence urge and stress 07/25/2012    Dessie Coma, PT DPT  10/08/2020, 8:13 PM  [facility] [address] [place], [place], [address] Phone: [phone]   Fax:  [phone]  Name: BREEANA SAWTELLE MRN: [id number] Date of Birth: 09-Jan-1946

## 2020-10-14 DIAGNOSIS — M25511 Pain in right shoulder: Secondary | ICD-10-CM | POA: Diagnosis not present

## 2020-10-14 DIAGNOSIS — M19012 Primary osteoarthritis, left shoulder: Secondary | ICD-10-CM | POA: Diagnosis not present

## 2020-10-14 DIAGNOSIS — M19011 Primary osteoarthritis, right shoulder: Secondary | ICD-10-CM | POA: Diagnosis not present

## 2020-10-14 DIAGNOSIS — M25512 Pain in left shoulder: Secondary | ICD-10-CM | POA: Diagnosis not present

## 2020-10-17 ENCOUNTER — Encounter (HOSPITAL_BASED_OUTPATIENT_CLINIC_OR_DEPARTMENT_OTHER): Payer: Self-pay | Admitting: Physical Therapy

## 2020-10-17 ENCOUNTER — Ambulatory Visit (HOSPITAL_BASED_OUTPATIENT_CLINIC_OR_DEPARTMENT_OTHER): Payer: Medicare Other | Admitting: Physical Therapy

## 2020-10-17 ENCOUNTER — Other Ambulatory Visit: Payer: Self-pay

## 2020-10-17 DIAGNOSIS — M25512 Pain in left shoulder: Secondary | ICD-10-CM | POA: Diagnosis not present

## 2020-10-17 DIAGNOSIS — R2689 Other abnormalities of gait and mobility: Secondary | ICD-10-CM | POA: Diagnosis not present

## 2020-10-17 DIAGNOSIS — G8929 Other chronic pain: Secondary | ICD-10-CM

## 2020-10-17 DIAGNOSIS — M6281 Muscle weakness (generalized): Secondary | ICD-10-CM | POA: Diagnosis not present

## 2020-10-17 DIAGNOSIS — M25511 Pain in right shoulder: Secondary | ICD-10-CM

## 2020-10-17 DIAGNOSIS — R262 Difficulty in walking, not elsewhere classified: Secondary | ICD-10-CM

## 2020-10-17 DIAGNOSIS — M25662 Stiffness of left knee, not elsewhere classified: Secondary | ICD-10-CM

## 2020-10-17 DIAGNOSIS — M25562 Pain in left knee: Secondary | ICD-10-CM | POA: Diagnosis not present

## 2020-10-19 ENCOUNTER — Encounter (HOSPITAL_BASED_OUTPATIENT_CLINIC_OR_DEPARTMENT_OTHER): Payer: Self-pay | Admitting: Physical Therapy

## 2020-10-19 NOTE — Therapy (Signed)
[facility] [address] [place], [place], [address] Phone: [phone]   Fax:  [phone]  Physical Therapy Treatment  Patient Details  Name: Megan Nunez MRN: [id number] Date of Birth: 06/03/1945 Referring Provider (PT): Teryl Lucy, MD   Encounter Date: 10/17/2020   PT End of Session - 10/19/20 1426     Visit Number 13    Number of Visits 24    Date for PT Re-Evaluation 11/19/20    Authorization Type [employer] Medicare apply KX 2nd to previous therapy visits    PT Start Time 1015    PT Stop Time 1057    PT Time Calculation (min) 42 min    Activity Tolerance Patient tolerated treatment well    Behavior During Therapy [facility] for tasks assessed/performed             Past Medical History:  Diagnosis Date   Anxiety    Arthritis    Depression    Family history of adverse reaction to anesthesia 2012   Mother has cardiac  arrest after surgery   Hepatitis    high antibodies for hepatitis   Obesity     Past Surgical History:  Procedure Laterality Date   LAPAROSCOPIC TUBAL LIGATION  1985   TOTAL KNEE ARTHROPLASTY Right 04/15/2020   Procedure: TOTAL KNEE ARTHROPLASTY;  Surgeon: Teryl Lucy, MD;  Location: [facility];  Service: Orthopedics;  Laterality: Right;   TOTAL KNEE ARTHROPLASTY Left 08/05/2020   Procedure: TOTAL KNEE ARTHROPLASTY;  Surgeon: Teryl Lucy, MD;  Location: [facility];  Service: Orthopedics;  Laterality: Left;   WISDOM TOOTH EXTRACTION     age 75    There were no vitals filed for this visit.   Subjective Assessment - 10/19/20 1423     Subjective Patient reports her left shoulder has responded well to exercises. She has had very little pain in the left shoulder. she has had more pain on the left.    Patient is accompained by: Family member    Pertinent History right total knee in the past, anxiety, depression    Limitations Standing;Walking;Lifting    How long can you sit comfortably? n/a    How long  can you stand comfortably? 10-15 mins    How long can you walk comfortably? limited distance,    Patient Stated Goals improve strenght, endurance,    Currently in Pain? No/denies                               [facility] Adult PT Treatment/Exercise - 10/19/20 0001       Knee/Hip Exercises: Aerobic   Nustep 5 min L4 ue/le      Knee/Hip Exercises: Standing   Heel Raises Limitations 2x20    Functional Squat Limitations x15    Other Standing Knee Exercises Slow March 2x15      Shoulder Exercises: Supine   Other Supine Exercises wand fleion bilateral in pain free range; supine ABC 1lb weight with left shoulder      Shoulder Exercises: Pulleys   Flexion Limitations 2 min      Shoulder Exercises: Stretch   Other Shoulder Stretches bilateral scap retraction 2x10 yellow; shoulder extension 2x15                     PT Education - 10/19/20 1424     Person(s) Educated Patient    Methods Explanation;Demonstration;Tactile cues;Verbal cues    Comprehension Verbalized  understanding;Returned demonstration;Verbal cues required;Tactile cues required              PT Short Term Goals - 09/25/20 1953       PT SHORT TERM GOAL #1   Title Patient will report decrease pain to less than 3/10.    Baseline minor pain time to time    Time 4    Period Weeks    Status Achieved    Target Date 10/23/20      PT SHORT TERM GOAL #2   Title Patient will improve hip flexion strength to 4+/5 in left knee in order to increase walking distance.    Baseline 4+/5    Time 4    Period Weeks    Status Achieved      PT SHORT TERM GOAL #3   Title Patient will demonstrate 110 degrees on the left    Baseline 0-120    Time 4    Period Weeks    Status Achieved    Target Date 10/23/20               PT Long Term Goals - 09/25/20 1954       PT LONG TERM GOAL #1   Title Patient will improve 5x sit to stand to under 13 seconds in order to decrease risk of falls during  ambulation.    Baseline not tested this visit    Time 8    Period Weeks    Status On-going      PT LONG TERM GOAL #2   Title Patient will show 5/5 strength in left knee in order to ambulate around community safely.    Baseline mild limitations in hip flexion and knee extension    Time 8    Period Weeks    Status On-going      PT LONG TERM GOAL #3   Title Patient will be able have active range of motion in left and knee atleast 95% equal in order to safely perform ADL's with appropriate mechanics.    Baseline full range    Time 8    Period Weeks    Status Achieved      PT LONG TERM GOAL #4   Title Pt will be able to tolerate standing at least 30 minutes for improved home and community mobility.    Baseline improved but not to 30 min yet    Time 8    Period Weeks    Status On-going                   Plan - 10/19/20 1426     Clinical Impression Statement Therapy focused on exercises for the left shoulder. She was also able to do light exercises on her right shoulder without increased pain. She did a few exercises for the knee but she is pretty much indpednent at this tie for the knee.    Personal Factors and Comorbidities Age;Fitness;Past/Current Experience;Time since onset of injury/illness/exacerbation    Examination-Activity Limitations Transfers;Locomotion Level;Stairs;Stand;Hygiene/Grooming;Other    Examination-Participation Restrictions Community Activity;Driving;Cleaning    Stability/Clinical Decision Making Stable/Uncomplicated    Clinical Decision Making Low    Rehab Potential Excellent    PT Frequency 2x / week    PT Treatment/Interventions ADLs/Self Care Home Management;Therapeutic exercise;Aquatic Therapy;Cryotherapy;Iontophoresis 4mg /ml Dexamethasone;Moist Heat;Ultrasound;DME Instruction;Electrical Stimulation;Gait training;Stair training;Functional mobility training;Therapeutic activities;Balance training;Neuromuscular re-education;Manual  techniques;Patient/family education;Orthotic Fit/Training;Scar mobilization;Dry needling;Energy conservation;Taping;Vasopneumatic Device    PT Next Visit Plan balance progression, extensor strength progression    PT Home  Exercise Plan Access Code: [email or web address]    Consulted and Agree with Plan of Care Patient    Family Member Consulted Husband             Patient will benefit from skilled therapeutic intervention in order to improve the following deficits and impairments:  Abnormal gait, Increased fascial restricitons, Pain, Decreased mobility, Decreased activity tolerance, Decreased range of motion, Decreased strength, Hypomobility, Decreased balance, Difficulty walking, Increased edema, Decreased endurance, Impaired flexibility, Decreased scar mobility  Visit Diagnosis: Acute pain of left knee  Muscle weakness (generalized)  Difficulty walking  Stiffness of left knee, not elsewhere classified  Chronic left shoulder pain  Chronic right shoulder pain     Problem List Patient Active Problem List   Diagnosis Date Noted   S/P TKR (total knee replacement), left 08/05/2020   Skin infection 05/28/2020   Wound of skin 05/28/2020   S/P knee surgery 05/28/2020   Eczematous dermatitis of upper eyelids of both eyes 05/28/2020   Osteoarthritis of left knee 05/27/2020   S/P total knee replacement, right 04/15/2020   Osteopenia 04/10/2020   Major depressive disorder, recurrent episode, mild (HCC) 10/31/2017   ADHD, predominantly inattentive type 10/31/2017   Primary osteoarthritis of right knee 09/09/2016   Primary osteoarthritis, right shoulder 09/09/2016   Arthritis 07/25/2012   Tinnitus 07/25/2012   Mixed incontinence urge and stress 07/25/2012    Dessie Coma, PT 10/19/2020, 3:51 PM  [facility] [address] [place], [place], [address] Phone: [phone]   Fax:  [phone]  Name: Megan Nunez MRN: [id number] Date of  Birth: 28-Feb-1945

## 2020-10-21 DIAGNOSIS — Z1231 Encounter for screening mammogram for malignant neoplasm of breast: Secondary | ICD-10-CM | POA: Diagnosis not present

## 2020-10-21 LAB — HM MAMMOGRAPHY

## 2020-10-22 ENCOUNTER — Encounter (HOSPITAL_BASED_OUTPATIENT_CLINIC_OR_DEPARTMENT_OTHER): Payer: Self-pay | Admitting: Physical Therapy

## 2020-10-22 ENCOUNTER — Ambulatory Visit (HOSPITAL_BASED_OUTPATIENT_CLINIC_OR_DEPARTMENT_OTHER): Payer: Medicare Other | Attending: Orthopedic Surgery | Admitting: Physical Therapy

## 2020-10-22 ENCOUNTER — Other Ambulatory Visit: Payer: Self-pay

## 2020-10-22 DIAGNOSIS — M25512 Pain in left shoulder: Secondary | ICD-10-CM | POA: Diagnosis not present

## 2020-10-22 DIAGNOSIS — M25562 Pain in left knee: Secondary | ICD-10-CM | POA: Insufficient documentation

## 2020-10-22 DIAGNOSIS — M6281 Muscle weakness (generalized): Secondary | ICD-10-CM | POA: Insufficient documentation

## 2020-10-22 DIAGNOSIS — G8929 Other chronic pain: Secondary | ICD-10-CM | POA: Insufficient documentation

## 2020-10-22 DIAGNOSIS — R262 Difficulty in walking, not elsewhere classified: Secondary | ICD-10-CM | POA: Insufficient documentation

## 2020-10-22 DIAGNOSIS — M25511 Pain in right shoulder: Secondary | ICD-10-CM | POA: Insufficient documentation

## 2020-10-23 ENCOUNTER — Encounter: Payer: Self-pay | Admitting: Family Medicine

## 2020-10-23 ENCOUNTER — Encounter (HOSPITAL_BASED_OUTPATIENT_CLINIC_OR_DEPARTMENT_OTHER): Payer: Self-pay | Admitting: Physical Therapy

## 2020-10-23 ENCOUNTER — Ambulatory Visit (INDEPENDENT_AMBULATORY_CARE_PROVIDER_SITE_OTHER): Payer: Medicare Other | Admitting: Family Medicine

## 2020-10-23 VITALS — BP 140/80 | HR 68 | Ht 59.5 in | Wt 156.8 lb

## 2020-10-23 DIAGNOSIS — L237 Allergic contact dermatitis due to plants, except food: Secondary | ICD-10-CM | POA: Diagnosis not present

## 2020-10-23 MED ORDER — PREDNISONE 10 MG (21) PO TBPK: ORAL_TABLET | ORAL | 0 refills | Status: DC

## 2020-10-23 MED ORDER — TRIAMCINOLONE ACETONIDE 0.1 % EX CREA: TOPICAL_CREAM | CUTANEOUS | 0 refills | Status: DC

## 2020-10-23 NOTE — Patient Instructions (Signed)
Use the steroid cream sparingly to affected areas of the skin twice daily for 7-10 days (or until healed).  Don't apply this to normal-appearing skin.  Rinse your hands after applying.  If you find that your rash is continuing to spread to new areas, then we need to switch to the oral steroids instead of the topical (you can still use the topical if helping for itching). We discussed the possible side effects--you may use benadryl or a PM medication if it causes insomnia. If you have more severe side effects regarding mood--you should either decrease the dose significantly, or stop the medication entirely if any suicidal thoughts.  If you have any recurrent rash upon completion of the steroid pills, please contact us and we can extend the steroids at the lower dose for longer.  Poison Ivy Dermatitis Poison ivy dermatitis is inflammation of the skin that is caused by chemicals in the leaves of the poison ivy plant. The skin reaction often involves redness, swelling, blisters, and extreme itching. What are the causes? This condition is caused by a chemical (urushiol) found in the sap of the poison ivy plant. This chemical is sticky and can be easily spread to people, animals, and objects. You can get poison ivy dermatitis by: Having direct contact with a poison ivy plant. Touching animals, other people, or objects that have come in contact with poison ivy and have the chemical on them. What increases the risk? This condition is more likely to develop in people who: Are outdoors often in wooded or [place] areas. Go outdoors without wearing protective clothing, such as closed shoes, long pants, and a long-sleeved shirt. What are the signs or symptoms? Symptoms of this condition include: Redness of the skin. Extreme itching. A rash that often includes bumps and blisters. The rash usually appears 48 hours after exposure, if you have been exposed before. If this is the first time you have been  exposed, the rash may not appear until a week after exposure. Swelling. This may occur if the reaction is more severe. Symptoms usually last for 1-2 weeks. However, the first time you develop this condition, symptoms may last 3-4 weeks. How is this diagnosed? This condition may be diagnosed based on your symptoms and a physical exam. Your health care provider may also ask you about any recent outdoor activity. How is this treated? Treatment for this condition will vary depending on how severe it is. Treatment may include: Hydrocortisone cream or calamine lotion to relieve itching. Oatmeal baths to soothe the skin. Medicines, such as over-the-counter antihistamine tablets. Oral steroid medicine, for more severe reactions. Follow these instructions at home: Medicines Take or apply over-the-counter and prescription medicines only as told by your health care provider. Use hydrocortisone cream or calamine lotion as needed to soothe the skin and relieve itching. General instructions Do not scratch or rub your skin. Apply a cold, wet cloth (cold compress) to the affected areas or take baths in cool water. This will help with itching. Avoid hot baths and showers. Take oatmeal baths as needed. Use colloidal oatmeal. You can get this at your local pharmacy or grocery store. Follow the instructions on the packaging. While you have the rash, wash clothes right after you wear them. Keep all follow-up visits as told by your health care provider. This is important. How is this prevented?  Learn to identify the poison ivy plant and avoid contact with the plant. This plant can be recognized by the number of leaves. Generally, poison  ivy has three leaves with flowering branches on a single stem. The leaves are typically glossy, and they have jagged edges that come to a point at the front. If you have been exposed to poison ivy, thoroughly wash with soap and water right away. You have about 30 minutes to remove  the plant resin before it will cause the rash. Be sure to wash under your fingernails, because any plant resin there will continue to spread the rash. When hiking or camping, wear clothes that will help you to avoid exposure on the skin. This includes long pants, a long-sleeved shirt, tall socks, and hiking boots. You can also apply preventive lotion to your skin to help limit exposure. If you suspect that your clothes or outdoor gear came in contact with poison ivy, rinse them off outside with a garden hose before you bring them inside your house. When doing yard work or gardening, wear gloves, long sleeves, long pants, and boots. Wash your garden tools and gloves if they come in contact with poison ivy. If you suspect that your pet has come into contact with poison ivy, wash him or her with pet shampoo and water. Make sure to wear gloves while washing your pet. Contact a health care provider if you have: Open sores in the rash area. More redness, swelling, or pain in the affected area. Redness that spreads beyond the rash area. Fluid, blood, or pus coming from the affected area. A fever. A rash over a large area of your body. A rash on your eyes, mouth, or genitals. A rash that does not improve after a few weeks. Get help right away if: Your face swells or your eyes swell shut. You have trouble breathing. You have trouble swallowing. These symptoms may represent a serious problem that is an emergency. Do not wait to see if the symptoms will go away. Get medical help right away. Call your local emergency services (911 in the [place] Do not drive yourself to the hospital. Summary Poison ivy dermatitis is inflammation of the skin that is caused by chemicals in the leaves of the poison ivy plant. Symptoms of this condition include redness, itching, a rash, and swelling. Do not scratch or rub your skin. Take or apply over-the-counter and prescription medicines only as told by your health care  provider. This information is not intended to replace advice given to you by your health care provider. Make sure you discuss any questions you have with your health care provider. Document Revised: 04/28/2018 Document Reviewed: 12/30/2017 Elsevier Patient Education  2022 [employer].

## 2020-10-23 NOTE — Progress Notes (Signed)
Chief Complaint  Patient presents with   Poison Ivy    Started 3 days ago and has gotten worse over the last few days. Has been using calamine lotion.    9/28 she pulled poison ivy out from her yard.  She reports she wore gloves, and bathed with Dawn dishwashing liquid afterwards.  10/1 she put away the sheers and cleaned things up, forgot to wash her hands afterwards. 1-2 days later she started with very mild rash along the anterior forearm on the left side. She now has it under her breasts and on the left knee.  It seems to be getting worse each day.  3 years ago she had a month-long case of poison ivy (never came for treatment)  PMH, PSH, SH reviewed  Outpatient Encounter Medications as of 10/23/2020  Medication Sig Note   buPROPion (WELLBUTRIN XL) 300 MG 24 hr tablet Take 1 tablet (300 mg total) by mouth daily. 10/23/2020: Missed last 2 days   calamine lotion Apply 1 application topically as needed for itching.    Cholecalciferol (VITAMIN D) 50 MCG (2000 UT) tablet Take 2,000 Units by mouth daily.    DULoxetine (CYMBALTA) 30 MG capsule TAKE ONE CAPSULE BY MOUTH DAILY TAKE WITH 60 MG 10/23/2020: Missed last 2 days   DULoxetine (CYMBALTA) 60 MG capsule Take 1 capsule (60 mg total) by mouth every morning. Take with a 30 mg capsule to equal total dose of 90 mg 10/23/2020: Missed last 2 days   Ginkgo Biloba 120 MG TABS Take 120 mg by mouth daily.    OVER THE COUNTER MEDICATION Apply 1 application topically daily as needed (knee pain). CBD Cream (Patient not taking: Reported on 10/23/2020)    polyvinyl alcohol (LIQUIFILM TEARS) 1.4 % ophthalmic solution Place 1 drop into both eyes as needed for dry eyes. (Patient not taking: Reported on 10/23/2020)    [DISCONTINUED] aspirin EC 325 MG tablet Take 1 tablet (325 mg total) by mouth 2 (two) times daily.    [DISCONTINUED] Glucosamine-Chondroitin 750-600 MG CHEW Chew 2 tablets by mouth daily.    [DISCONTINUED] ondansetron (ZOFRAN) 4 MG tablet Take 1 tablet  (4 mg total) by mouth every 8 (eight) hours as needed for nausea or vomiting.    [DISCONTINUED] oxyCODONE (ROXICODONE) 5 MG immediate release tablet Take 1 tablet (5 mg total) by mouth every 4 (four) hours as needed for severe pain.    [DISCONTINUED] sennosides-docusate sodium (SENOKOT-S) 8.6-50 MG tablet Take 2 tablets by mouth daily.    No facility-administered encounter medications on file as of 10/23/2020.   Allergies  Allergen Reactions   Nickel Itching and Rash   ROS: no fever, chills, URI symptoms, headaches, dizziness, chest pain, shortness of breath.  Rash per HPI.  Moods are stable.  See HPI   PHYSICAL EXAM:  BP 140/80   Pulse 68   Ht 4' 11.5" (1.511 m)   Wt 156 lb 12.8 oz (71.1 kg)   BMI 31.14 kg/m   Pleasant, well-appearing female, in no distress HEENT: conjunctiva and sclera are clear, EOMI, wearing mask Skin:  Small patch of erythematous papules at the left medial knee. WHSS noted Abdomen: extensive area of erythema starting below both breasts and extending inferiorly. Small central section connecting the two areas at mid-abdomen. L antecubital fossa with erythema and some excoriations. Fine small erythematous papules at the left anterior forearm. Back is clear. Neuro: alert and oriented, normal strength, gait Psych: normal mood, affect, hygiene and grooming   ASSESSMENT/PLAN:  Poison ivy  dermatitis - start with topical steroids to affected areas.  Oral steroid course sent to pharmacy, to start if continues to spread.  Risks/SE reviewed in detail - Plan: triamcinolone cream (KENALOG) 0.1 %, predniSONE (STERAPRED UNI-PAK 21 TAB) 10 MG (21) TBPK tablet  Use the steroid cream sparingly to affected areas of the skin twice daily for 7-10 days (or until healed).  Don't apply this to normal-appearing skin.  Rinse your hands after applying.  If you find that your rash is continuing to spread to new areas, then we need to switch to the oral steroids instead of the topical  (you can still use the topical if helping for itching). We discussed the possible side effects--you may use benadryl or a PM medication if it causes insomnia. If you have more severe side effects regarding mood--you should either decrease the dose significantly, or stop the medication entirely if any suicidal thoughts.  If you have any recurrent rash upon completion of the steroid pills, please contact us and we can extend the steroids at the lower dose for longer.

## 2020-10-23 NOTE — Therapy (Signed)
[facility] [address] [place], [place], [address] Phone: [phone]   Fax:  [phone]  Physical Therapy Treatment  Patient Details  Name: Megan Nunez MRN: [id number] Date of Birth: April 25, 1945 Referring Provider (PT): Teryl Lucy, MD   Encounter Date: 10/22/2020   PT End of Session - 10/22/20 1437     Visit Number 14    Number of Visits 24    Date for PT Re-Evaluation 11/19/20    Authorization Type U[employer] Medicare apply KX 2nd to previous therapy visits    PT Start Time 1432    PT Stop Time 1512    PT Time Calculation (min) 40 min    Activity Tolerance Patient tolerated treatment well    Behavior During Therapy [facility] for tasks assessed/performed             Past Medical History:  Diagnosis Date   Anxiety    Arthritis    Depression    Family history of adverse reaction to anesthesia 2012   Mother has cardiac  arrest after surgery   Hepatitis    high antibodies for hepatitis   Obesity     Past Surgical History:  Procedure Laterality Date   LAPAROSCOPIC TUBAL LIGATION  1985   TOTAL KNEE ARTHROPLASTY Right 04/15/2020   Procedure: TOTAL KNEE ARTHROPLASTY;  Surgeon: Teryl Lucy, MD;  Location: [facility];  Service: Orthopedics;  Laterality: Right;   TOTAL KNEE ARTHROPLASTY Left 08/05/2020   Procedure: TOTAL KNEE ARTHROPLASTY;  Surgeon: Teryl Lucy, MD;  Location: [facility];  Service: Orthopedics;  Laterality: Left;   WISDOM TOOTH EXTRACTION     age 75    There were no vitals filed for this visit.   Subjective Assessment - 10/22/20 1435     Subjective Patient reports her shoulder is not exactly hurting, but it is not not exactly not hurting. She feels like she is able to do a little more. Her knee is doing OK.    Patient is accompained by: Family member    Pertinent History right total knee in the past, anxiety, depression    Limitations Standing;Walking;Lifting    How long can you sit comfortably? n/a     How long can you stand comfortably? 10-15 mins    How long can you walk comfortably? limited distance,    Patient Stated Goals improve strenght, endurance,    Currently in Pain? No/denies   urts at times.                              [facility] Adult PT Treatment/Exercise - 10/23/20 0001       Self-Care   Other Self-Care Comments  reviewed use of thera-cane and where to buy as well as use of the roller . She was advised to use a rolling pin at home.      Knee/Hip Exercises: Aerobic   Nustep 5 min L4 ue/le      Knee/Hip Exercises: Standing   Heel Raises Limitations 2x20    Functional Squat Limitations x15    Other Standing Knee Exercises Slow March 2x15      Knee/Hip Exercises: Supine   Bridges with [employer] Pies Squeeze Strengthening;15 reps;2 sets    Straight Leg Raises Limitations 2x15      Shoulder Exercises: Supine   Other Supine Exercises wand flexion bilateral in pain free range; supine ABC 1lb weight with left shoulder      Shoulder Exercises: Pulleys  Flexion Limitations 2 min      Shoulder Exercises: Stretch   Other Shoulder Stretches bilateral scap retraction 2x10 yellow; shoulder extension 2x15      Manual Therapy   Manual Therapy Joint mobilization    Joint Mobilization L knee flexion mobposterior tib grade III    Soft tissue mobilization to upper trap    Passive ROM full range noted                     PT Education - 10/22/20 1437     Education Details reviewed exercises for shoulder and knee    Person(s) Educated Patient    Methods Explanation;Demonstration;Tactile cues;Verbal cues    Comprehension Verbalized understanding;Returned demonstration;Verbal cues required;Tactile cues required              PT Short Term Goals - 09/25/20 1953       PT SHORT TERM GOAL #1   Title Patient will report decrease pain to less than 3/10.    Baseline minor pain time to time    Time 4    Period Weeks    Status Achieved    Target Date  10/23/20      PT SHORT TERM GOAL #2   Title Patient will improve hip flexion strength to 4+/5 in left knee in order to increase walking distance.    Baseline 4+/5    Time 4    Period Weeks    Status Achieved      PT SHORT TERM GOAL #3   Title Patient will demonstrate 110 degrees on the left    Baseline 0-120    Time 4    Period Weeks    Status Achieved    Target Date 10/23/20               PT Long Term Goals - 09/25/20 1954       PT LONG TERM GOAL #1   Title Patient will improve 5x sit to stand to under 13 seconds in order to decrease risk of falls during ambulation.    Baseline not tested this visit    Time 8    Period Weeks    Status On-going      PT LONG TERM GOAL #2   Title Patient will show 5/5 strength in left knee in order to ambulate around community safely.    Baseline mild limitations in hip flexion and knee extension    Time 8    Period Weeks    Status On-going      PT LONG TERM GOAL #3   Title Patient will be able have active range of motion in left and knee atleast 95% equal in order to safely perform ADL's with appropriate mechanics.    Baseline full range    Time 8    Period Weeks    Status Achieved      PT LONG TERM GOAL #4   Title Pt will be able to tolerate standing at least 30 minutes for improved home and community mobility.    Baseline improved but not to 30 min yet    Time 8    Period Weeks    Status On-going                   Plan - 10/22/20 1439     Clinical Impression Statement Patient's knee flexion was good, but she had some tightness in her IT band. She was shown how to use the roller to improve  her trigger points in her IT band. She also had a trigger point in her upper trap. She has had it needled before but it came back. She was shown the thera-cane and how to use it. Therapy reviewed her shoulder exercises with her. She tolerated well.    Personal Factors and Comorbidities Age;Fitness;Past/Current Experience;Time  since onset of injury/illness/exacerbation    Examination-Activity Limitations Transfers;Locomotion Level;Stairs;Stand;Hygiene/Grooming;Other    Examination-Participation Restrictions Community Activity;Driving;Cleaning    Stability/Clinical Decision Making Stable/Uncomplicated    Clinical Decision Making Low    Rehab Potential Excellent    PT Frequency 2x / week    PT Duration 6 weeks    PT Treatment/Interventions ADLs/Self Care Home Management;Therapeutic exercise;Aquatic Therapy;Cryotherapy;Iontophoresis 4mg /ml Dexamethasone;Moist Heat;Ultrasound;DME Instruction;Electrical Stimulation;Gait training;Stair training;Functional mobility training;Therapeutic activities;Balance training;Neuromuscular re-education;Manual techniques;Patient/family education;Orthotic Fit/Training;Scar mobilization;Dry needling;Energy conservation;Taping;Vasopneumatic Device    PT Next Visit Plan balance progression, extensor strength progression    PT Home Exercise Plan Access Code: [email or web address]    Consulted and Agree with Plan of Care Patient    Family Member Consulted Husband             Patient will benefit from skilled therapeutic intervention in order to improve the following deficits and impairments:  Abnormal gait, Increased fascial restricitons, Pain, Decreased mobility, Decreased activity tolerance, Decreased range of motion, Decreased strength, Hypomobility, Decreased balance, Difficulty walking, Increased edema, Decreased endurance, Impaired flexibility, Decreased scar mobility  Visit Diagnosis: Acute pain of left knee  Muscle weakness (generalized)  Difficulty walking  Chronic left shoulder pain  Chronic right shoulder pain     Problem List Patient Active Problem List   Diagnosis Date Noted   S/P TKR (total knee replacement), left 08/05/2020   Skin infection 05/28/2020   Wound of skin 05/28/2020   S/P knee surgery 05/28/2020   Eczematous dermatitis of upper eyelids of both eyes 05/28/2020    Osteoarthritis of left knee 05/27/2020   S/P total knee replacement, right 04/15/2020   Osteopenia 04/10/2020   Major depressive disorder, recurrent episode, mild (HCC) 10/31/2017   ADHD, predominantly inattentive type 10/31/2017   Primary osteoarthritis of right knee 09/09/2016   Primary osteoarthritis, right shoulder 09/09/2016   Arthritis 07/25/2012   Tinnitus 07/25/2012   Mixed incontinence urge and stress 07/25/2012    09/25/2012, PT DPT  10/23/2020, 2:20 PM  [facility] [address] [place], [place], [place] Phone: [phone]   Fax:  [phone]  Name: Megan Nunez MRN: [name] Date of Birth: 1945/10/08

## 2020-10-29 ENCOUNTER — Ambulatory Visit (HOSPITAL_BASED_OUTPATIENT_CLINIC_OR_DEPARTMENT_OTHER): Payer: Medicare Other | Admitting: Physical Therapy

## 2020-10-30 ENCOUNTER — Encounter: Payer: Self-pay | Admitting: Family Medicine

## 2020-10-30 MED ORDER — PREDNISONE 10 MG PO TABS: ORAL_TABLET | ORAL | 0 refills | Status: DC

## 2020-11-05 ENCOUNTER — Other Ambulatory Visit: Payer: Self-pay

## 2020-11-05 ENCOUNTER — Ambulatory Visit (HOSPITAL_BASED_OUTPATIENT_CLINIC_OR_DEPARTMENT_OTHER): Payer: Medicare Other | Admitting: Physical Therapy

## 2020-11-05 DIAGNOSIS — M25562 Pain in left knee: Secondary | ICD-10-CM | POA: Diagnosis not present

## 2020-11-05 DIAGNOSIS — M6281 Muscle weakness (generalized): Secondary | ICD-10-CM

## 2020-11-05 DIAGNOSIS — G8929 Other chronic pain: Secondary | ICD-10-CM | POA: Diagnosis not present

## 2020-11-05 DIAGNOSIS — M25511 Pain in right shoulder: Secondary | ICD-10-CM | POA: Diagnosis not present

## 2020-11-05 DIAGNOSIS — M25512 Pain in left shoulder: Secondary | ICD-10-CM | POA: Diagnosis not present

## 2020-11-05 DIAGNOSIS — R262 Difficulty in walking, not elsewhere classified: Secondary | ICD-10-CM

## 2020-11-06 ENCOUNTER — Encounter (HOSPITAL_BASED_OUTPATIENT_CLINIC_OR_DEPARTMENT_OTHER): Payer: Self-pay | Admitting: Physical Therapy

## 2020-11-06 NOTE — Therapy (Addendum)
[facility] [address] [place], [place], [address] Phone: [phone]   Fax:  [phone]  Physical Therapy Treatment  Patient Details  Name: Megan Nunez MRN: [id number] Date of Birth: 08/20/1945 Referring Provider (PT): Marchia Bond, MD   Encounter Date: 11/05/2020   PT End of Session - 11/06/20 1325     Visit Number 15    Number of Visits 24    Date for PT Re-Evaluation 11/19/20    Authorization Type U[employer] Medicare apply KX 2nd to previous therapy visits    PT Start Time 1015    PT Stop Time 1057    PT Time Calculation (min) 42 min    Activity Tolerance Patient tolerated treatment well    Behavior During Therapy [facility] for tasks assessed/performed             Past Medical History:  Diagnosis Date   Anxiety    Arthritis    Depression    Family history of adverse reaction to anesthesia 2012   Mother has cardiac  arrest after surgery   Hepatitis    high antibodies for hepatitis   Obesity     Past Surgical History:  Procedure Laterality Date   [place] Right 04/15/2020   Procedure: TOTAL KNEE ARTHROPLASTY;  Surgeon: Marchia Bond, MD;  Location: [facility];  Service: Orthopedics;  Laterality: Right;   TOTAL KNEE ARTHROPLASTY Left 08/05/2020   Procedure: TOTAL KNEE ARTHROPLASTY;  Surgeon: Marchia Bond, MD;  Location: [facility];  Service: Orthopedics;  Laterality: Left;   WISDOM TOOTH EXTRACTION     age 75    There were no vitals filed for this visit.   Subjective Assessment - 11/06/20 1323     Subjective Patient is having some issues with mental health and home issues which are effecting her ability to perfrom her rehab. She would like to discharge today and focus on those things.    Patient is accompained by: Family member    Pertinent History right total knee in the past, anxiety, depression    Limitations Standing;Walking;Lifting    How long can you sit  comfortably? n/a    How long can you stand comfortably? 10-15 mins    How long can you walk comfortably? limited distance,    Patient Stated Goals improve strenght, endurance,    Currently in Pain? No/denies   just pain with active movement of the shoulder   Multiple Pain Sites No                               [facility] Adult PT Treatment/Exercise - 11/06/20 0001       Self-Care   Other Self-Care Comments  reviewed use of thera-cane and where to buy as well as use of the roller . She was advised to use a rolling pin at home.      Knee/Hip Exercises: Aerobic   Nustep 5 min L4 ue/le      Knee/Hip Exercises: Standing   Heel Raises Limitations 2x20    Functional Squat Limitations x15    Other Standing Knee Exercises Slow March 2x15      Knee/Hip Exercises: Supine   Bridges with Diona Foley Squeeze Strengthening;15 reps;2 sets    Straight Leg Raises Limitations 2x15      Shoulder Exercises: Supine   Other Supine Exercises wand flexion bilateral in pain free range; supine ABC 1lb weight  with left shoulder      Shoulder Exercises: Pulleys   Flexion Limitations 2 min      Shoulder Exercises: Stretch   Other Shoulder Stretches bilateral scap retraction 2x10 yellow; shoulder extension 2x15      Manual Therapy   Manual Therapy Joint mobilization    Joint Mobilization L knee flexion mobposterior tib grade III    Soft tissue mobilization to upper trap    Passive ROM full range noted                     PT Education - 11/06/20 0918     Education Details reviewed final HEP for her shoulder    Person(s) Educated Patient    Methods Explanation;Demonstration;Tactile cues;Verbal cues    Comprehension Verbalized understanding;Verbal cues required;Returned demonstration;Tactile cues required              PT Short Term Goals - 09/25/20 1953       PT SHORT TERM GOAL #1   Title Patient will report decrease pain to less than 3/10.    Baseline minor pain time  to time    Time 4    Period Weeks    Status Achieved    Target Date 10/23/20      PT SHORT TERM GOAL #2   Title Patient will improve hip flexion strength to 4+/5 in left knee in order to increase walking distance.    Baseline 4+/5    Time 4    Period Weeks    Status Achieved      PT SHORT TERM GOAL #3   Title Patient will demonstrate 110 degrees on the left    Baseline 0-120    Time 4    Period Weeks    Status Achieved    Target Date 10/23/20               PT Long Term Goals - 09/25/20 1954       PT LONG TERM GOAL #1   Title Patient will improve 5x sit to stand to under 13 seconds in order to decrease risk of falls during ambulation.    Baseline not tested this visit    Time 8    Period Weeks    Status On-going      PT LONG TERM GOAL #2   Title Patient will show 5/5 strength in left knee in order to ambulate around community safely.    Baseline mild limitations in hip flexion and knee extension    Time 8    Period Weeks    Status On-going      PT LONG TERM GOAL #3   Title Patient will be able have active range of motion in left and knee atleast 95% equal in order to safely perform ADL's with appropriate mechanics.    Baseline full range    Time 8    Period Weeks    Status Achieved      PT LONG TERM GOAL #4   Title Pt will be able to tolerate standing at least 30 minutes for improved home and community mobility.    Baseline improved but not to 30 min yet    Time 8    Period Weeks    Status On-going                   Plan - 11/05/20 1057     Clinical Impression Statement Therapy reviewed the patients HEP for her shoulderhe did well with  ehr HEP. She knows she needs to do it, but she needs toget ohter things under control. She has the help that she needs from that aspect. We will D/C at this time.    Personal Factors and Comorbidities Age;Fitness;Past/Current Experience;Time since onset of injury/illness/exacerbation    Examination-Activity  Limitations Transfers;Locomotion Level;Stairs;Stand;Hygiene/Grooming;Other    Examination-Participation Restrictions Community Activity;Driving;Cleaning    Stability/Clinical Decision Making Stable/Uncomplicated    Clinical Decision Making Low    Rehab Potential Excellent    PT Frequency 2x / week    PT Duration 6 weeks    PT Treatment/Interventions ADLs/Self Care Home Management;Therapeutic exercise;Aquatic Therapy;Cryotherapy;Iontophoresis 90m/ml Dexamethasone;Moist Heat;Ultrasound;DME Instruction;Electrical Stimulation;Gait training;Stair training;Functional mobility training;Therapeutic activities;Balance training;Neuromuscular re-education;Manual techniques;Patient/family education;Orthotic Fit/Training;Scar mobilization;Dry needling;Energy conservation;Taping;Vasopneumatic Device    PT Next Visit Plan balance progression, extensor strength progression    PT Home Exercise Plan Access Code: [email or web address]   Consulted and Agree with Plan of Care Patient    Family Member Consulted Husband             Patient will benefit from skilled therapeutic intervention in order to improve the following deficits and impairments:  Abnormal gait, Increased fascial restricitons, Pain, Decreased mobility, Decreased activity tolerance, Decreased range of motion, Decreased strength, Hypomobility, Decreased balance, Difficulty walking, Increased edema, Decreased endurance, Impaired flexibility, Decreased scar mobility  Visit Diagnosis: Acute pain of left knee  Muscle weakness (generalized)  Difficulty walking  Chronic left shoulder pain  Chronic right shoulder pain   PHYSICAL THERAPY DISCHARGE SUMMARY  Visits from Start of Care: 15  Current functional level related to goals / functional outcomes: Excellent improvement in knee movement; has a full program for her shoulder    Remaining deficits: Pain with active movement of her left shoulder    Education / Equipment: HEP   Patient agrees to  discharge. Patient goals were met. Patient is being discharged due to meeting the stated rehab goals.   Problem List Patient Active Problem List   Diagnosis Date Noted   S/P TKR (total knee replacement), left 08/05/2020   Skin infection 05/28/2020   Wound of skin 05/28/2020   S/P knee surgery 05/28/2020   Eczematous dermatitis of upper eyelids of both eyes 05/28/2020   Osteoarthritis of left knee 05/27/2020   S/P total knee replacement, right 04/15/2020   Osteopenia 04/10/2020   Major [place] disorder, recurrent episode, [place] 10/31/2017   ADHD, predominantly inattentive type 10/31/2017   Primary osteoarthritis of right knee 09/09/2016   Primary osteoarthritis, right shoulder 09/09/2016   Arthritis 07/25/2012   Tinnitus 07/25/2012   Mixed incontinence urge and stress 07/25/2012    [facility] 11/06/2020, 1:35 PM  [place] [address]Phone: [phone]  Fax:  [phone] Name: Megan CADDENMRN: [id number] of Birth: [birth year]

## 2020-12-04 ENCOUNTER — Encounter: Payer: Self-pay | Admitting: Psychiatry

## 2020-12-04 ENCOUNTER — Other Ambulatory Visit: Payer: Self-pay

## 2020-12-04 ENCOUNTER — Ambulatory Visit: Payer: Medicare Other | Admitting: Psychiatry

## 2020-12-04 VITALS — BP 125/76 | HR 75

## 2020-12-04 DIAGNOSIS — F339 Major depressive disorder, recurrent, unspecified: Secondary | ICD-10-CM | POA: Diagnosis not present

## 2020-12-04 MED ORDER — DULOXETINE HCL 30 MG PO CPEP: ORAL_CAPSULE | ORAL | 0 refills | Status: DC

## 2020-12-04 MED ORDER — ARIPIPRAZOLE 5 MG PO TABS: ORAL_TABLET | ORAL | 1 refills | Status: DC

## 2020-12-04 MED ORDER — BUPROPION HCL ER (XL) 300 MG PO TB24: 300.0000 mg | ORAL_TABLET | Freq: Every day | ORAL | 1 refills | Status: DC

## 2020-12-04 MED ORDER — DULOXETINE HCL 60 MG PO CPEP: 60.0000 mg | ORAL_CAPSULE | Freq: Every morning | ORAL | 1 refills | Status: DC

## 2020-12-04 NOTE — Progress Notes (Signed)
Megan Nunez [id number] 09/19/1945 75 y.o.  Subjective:   Patient ID:  Megan Nunez is a 75 y.o. (DOB Mar 10, 1945) female.  Chief Complaint:  Chief Complaint  Patient presents with   Depression   Anxiety     HPI Megan Nunez presents to the office today for follow-up of ADD, depression, anxiety, and insomnia. She reports, "I'm not getting anything done." She reports that she stopped Vyvanse about 1.5 months ago due to concerns about BP. She reports, "without it I am not getting anything done." She recalls having severe depressive s/s around this time last year.   She reports that she has started sleeping later and "when I get out of bed, I just want to get back in it." Staying in bed more. Occ difficulty falling and staying asleep. She reports that she is sleeping 5-7 hours a night. "I am not thinking as well."   Continues to enjoy her church. Singing in a choir and reports that this positively effects her mood. 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 Currently there is uncertainty around music program in her church. "I am not taking the uncertainty well... because I depend on it so much." Notices some mistakes she would not typically make. She reports some compulsive over-eating and has been gaining weight. She denies any change in appetite. Energy and motivation have been lower. Diminished interest and enjoyment in things. Denies SI.   She reports that depression and anxiety was gradually worsening and may have further worsened after stopping Vyvanse. She reports having some panic "mentally but not physically." Has had increased anxiety around driving. Describes mood as "gloomy."   Past Psychiatric Medication Trials: Sertraline Prozac Cymbalta Wellbutrin XL Adderall Adderall XR Vyvanse   PHQ2-9    Flowsheet Row Office Visit from 05/28/2020 in [facility] Office Visit from  04/10/2020 in [facility] Office Visit from 03/18/2020 in [facility] Office Visit from 07/06/2019 in [facility] Office Visit from 04/09/2019 in [facility]  PHQ-2 Total Score 0 1 0 0 2  PHQ-9 Total Score 0 -- -- -- 13      Flowsheet Row Admission (Discharged) from 08/05/2020 in [place] [facility] Admission (Discharged) from 04/15/2020 in [place] [facility]  C-SSRS RISK CATEGORY No Risk No Risk        Review of Systems:  Review of Systems  Musculoskeletal:  Negative for gait problem.  Skin:        Recent severe poison ivy  Psychiatric/Behavioral:         Please refer to HPI   Medications: I have reviewed the patient's current medications.  Current Outpatient Medications  Medication Sig Dispense Refill   ARIPiprazole (ABILIFY) 5 MG tablet Take 1/2 tablet daily for 2 weeks, then may increase to 1 tablet daily if no significant improvement or side effects 30 tablet 1   Ascorbic Acid (VITAMIN C PO) Take by mouth.     BIOTIN PO Take by mouth.     Cholecalciferol (VITAMIN D) 50 MCG (2000 UT) tablet Take 2,000 Units by mouth daily.     Ginkgo Biloba 120 MG TABS Take 120 mg by mouth daily.     TURMERIC PO Take by mouth.     buPROPion (WELLBUTRIN XL) 300 MG 24 hr tablet Take 1 tablet (300 mg total) by mouth daily. 90 tablet 1   calamine lotion Apply 1 application  topically as needed for itching.     DULoxetine (CYMBALTA) 30 MG capsule TAKE ONE CAPSULE BY MOUTH DAILY TAKE WITH 60 MG 90 capsule 0   DULoxetine (CYMBALTA) 60 MG capsule Take 1 capsule (60 mg total) by mouth every morning. Take with a 30 mg capsule to equal total dose of 90 mg 90 capsule 1   OVER THE COUNTER MEDICATION Apply 1 application topically daily as needed (knee pain). CBD Cream (Patient not taking: Reported on 10/23/2020)     polyvinyl alcohol (LIQUIFILM TEARS) 1.4 % ophthalmic solution Place 1 drop into both eyes as needed for dry eyes. (Patient not  taking: Reported on 10/23/2020)     predniSONE (DELTASONE) 10 MG tablet Take 3 tablets for 1 day, then 2 tablets for 3 days, then 1 table for 3 days. (Patient not taking: Reported on 12/04/2020) 12 tablet 0   triamcinolone cream (KENALOG) 0.1 % Apply sparingly to affected areas of skin twice daily for up to 10 days. 45 g 0   No current facility-administered medications for this visit.    Medication Side Effects: Other: Possible increased BP with Vyvanse.   Allergies:  Allergies  Allergen Reactions   Nickel Itching and Rash    Past Medical History:  Diagnosis Date   Anxiety    Arthritis    Depression    Family history of adverse reaction to anesthesia 2012   Mother has cardiac  arrest after surgery   Hepatitis    high antibodies for hepatitis   Obesity     Past Medical History, Surgical history, Social history, and Family history were reviewed and updated as appropriate.   Please see review of systems for further details on the patient's review from today.   Objective:   Physical Exam:  BP 125/76   Pulse 75   Physical Exam Constitutional:      General: She is not in acute distress. Musculoskeletal:        General: No deformity.  Neurological:     Mental Status: She is alert and oriented to person, place, and time.     Coordination: Coordination normal.  Psychiatric:        Attention and Perception: Attention and perception normal. She does not perceive auditory or visual hallucinations.        Mood and Affect: Mood is anxious and depressed. Affect is not labile, blunt, angry or inappropriate.        Speech: Speech normal.        Behavior: Behavior normal.        Thought Content: Thought content normal. Thought content is not paranoid or delusional. Thought content does not include homicidal or suicidal ideation. Thought content does not include homicidal or suicidal plan.        Cognition and Memory: Cognition and memory normal.        Judgment: Judgment normal.      Comments: Insight intact    Lab Review:     Component Value Date/Time   NA 138 08/06/2020 0305   NA 141 04/10/2020 0927   K 4.5 08/06/2020 0305   CL 106 08/06/2020 0305   CO2 26 08/06/2020 0305   GLUCOSE 139 (H) 08/06/2020 0305   BUN 14 08/06/2020 0305   BUN 16 04/10/2020 0927   CREATININE 0.60 08/06/2020 0305   CREATININE 0.63 12/08/2016 1030   CALCIUM 8.8 (L) 08/06/2020 0305   PROT 7.5 07/18/2020 1344   PROT 6.9 04/10/2020 0927   ALBUMIN 4.5 07/18/2020 1344  ALBUMIN 4.7 04/10/2020 0927   AST 18 07/18/2020 1344   ALT 15 07/18/2020 1344   ALKPHOS 84 07/18/2020 1344   BILITOT 0.8 07/18/2020 1344   BILITOT 0.2 04/10/2020 0927   GFRNONAA >60 08/06/2020 0305   GFRAA 88 04/09/2019 1239       Component Value Date/Time   WBC 18.3 (H) 08/06/2020 0305   RBC 4.47 08/06/2020 0305   HGB 13.0 08/06/2020 0305   HGB 14.7 04/09/2019 1239   HCT 39.9 08/06/2020 0305   HCT 43.2 04/09/2019 1239   PLT 184 08/06/2020 0305   PLT 224 04/09/2019 1239   MCV 89.3 08/06/2020 0305   MCV 89 04/09/2019 1239   MCH 29.1 08/06/2020 0305   MCHC 32.6 08/06/2020 0305   RDW 13.0 08/06/2020 0305   RDW 12.8 04/09/2019 1239   LYMPHSABS 2.1 04/09/2019 1239   MONOABS 546 12/25/2015 1405   EOSABS 0.4 04/09/2019 1239   BASOSABS 0.1 04/09/2019 1239    No results found for: POCLITH, LITHIUM   No results found for: PHENYTOIN, PHENOBARB, VALPROATE, CBMZ   .res Assessment: Plan:   Pt seen for 45 minutes and time spent discussing treatment options for treatment resistant depression to include TMS and Spravato. Pt reports that she may contact [facility].  Discussed potential benefits, risks, and side effects of Abilify. Discussed potential metabolic side effects associated with atypical antipsychotics, as well as potential risk for movement side effects. Advised pt to contact office if movement side effects occur. She agrees to trial of Abilify. Will start Abilify 5 mg 1/2 tab po qd for 2 weeks, then may  increase to 1 tab po qd if symptoms have not significantly improved and not experiencing side effects.  Continue Cymbalta 90 mg po qd for anxiety and depression.  Continue Wellbutrin XL 300 mg po qd for depression.  Will not re-start Vyvanse at this time due to recent elevations in BP with improved BP after stopping Vyvanse. May consider future trial of Modafinil in the future for off-label indication for ADD if needed since this would have less risk of elevating BP.  Pt to follow-up in 2 months or sooner if clinically indicated.  Patient advised to contact office with any questions, adverse effects, or acute worsening in signs and symptoms.   Berklee was seen today for depression and anxiety.  Diagnoses and all orders for this visit:  Major depression, recurrent, chronic (HCC) -     ARIPiprazole (ABILIFY) 5 MG tablet; Take 1/2 tablet daily for 2 weeks, then may increase to 1 tablet daily if no significant improvement or side effects -     buPROPion (WELLBUTRIN XL) 300 MG 24 hr tablet; Take 1 tablet (300 mg total) by mouth daily. -     DULoxetine (CYMBALTA) 30 MG capsule; TAKE ONE CAPSULE BY MOUTH DAILY TAKE WITH 60 MG -     DULoxetine (CYMBALTA) 60 MG capsule; Take 1 capsule (60 mg total) by mouth every morning. Take with a 30 mg capsule to equal total dose of 90 mg    Please see After Visit Summary for patient specific instructions.  Future Appointments  Date Time Provider Department Center  02/03/2021  9:30 AM Corie Chiquito, PMHNP [facility] None  04/15/2021  8:30 AM Ronnald Nian, MD [facility]    No orders of the defined types were placed in this encounter.   -------------------------------

## 2020-12-16 ENCOUNTER — Telehealth: Payer: Self-pay | Admitting: Family Medicine

## 2020-12-16 NOTE — Chronic Care Management (AMB) (Signed)
  Chronic Care Management   Note  12/16/2020 Name: JEANETTA ALONZO MRN: [id number] DOB: Jun 06, 1945  SARY BOGIE is a 75 y.o. year old female who is a primary care patient of Susann Givens, Everardo All, MD. I reached out to [name] by phone today in response to a referral sent by Ms. Jon Billings Tillett's PCP, Ronnald Nian, MD.   Ms. Mohiuddin was given information about Chronic Care Management services today including:  CCM service includes personalized support from designated clinical staff supervised by her physician, including individualized plan of care and coordination with other care providers 24/7 contact phone numbers for assistance for urgent and routine care needs. Service will only be billed when office clinical staff spend 20 minutes or more in a month to coordinate care. Only one practitioner may furnish and bill the service in a calendar month. The patient may stop CCM services at any time (effective at the end of the month) by phone call to the office staff.   Patient agreed to services and verbal consent obtained.   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

## 2020-12-29 ENCOUNTER — Ambulatory Visit: Payer: Medicare Other | Admitting: Psychiatry

## 2021-02-03 ENCOUNTER — Ambulatory Visit: Payer: Medicare Other | Admitting: Psychiatry

## 2021-02-04 ENCOUNTER — Other Ambulatory Visit: Payer: Self-pay | Admitting: Orthopedic Surgery

## 2021-02-04 DIAGNOSIS — M25511 Pain in right shoulder: Secondary | ICD-10-CM

## 2021-02-04 DIAGNOSIS — M25512 Pain in left shoulder: Secondary | ICD-10-CM

## 2021-02-04 DIAGNOSIS — M19011 Primary osteoarthritis, right shoulder: Secondary | ICD-10-CM | POA: Diagnosis not present

## 2021-02-04 DIAGNOSIS — M19012 Primary osteoarthritis, left shoulder: Secondary | ICD-10-CM | POA: Diagnosis not present

## 2021-02-05 ENCOUNTER — Telehealth: Payer: Self-pay | Admitting: Pharmacist

## 2021-02-05 ENCOUNTER — Encounter: Payer: Self-pay | Admitting: Psychiatry

## 2021-02-05 ENCOUNTER — Other Ambulatory Visit: Payer: Self-pay

## 2021-02-05 ENCOUNTER — Ambulatory Visit (INDEPENDENT_AMBULATORY_CARE_PROVIDER_SITE_OTHER): Payer: Medicare Other | Admitting: Psychiatry

## 2021-02-05 DIAGNOSIS — F900 Attention-deficit hyperactivity disorder, predominantly inattentive type: Secondary | ICD-10-CM | POA: Diagnosis not present

## 2021-02-05 DIAGNOSIS — F3341 Major depressive disorder, recurrent, in partial remission: Secondary | ICD-10-CM | POA: Diagnosis not present

## 2021-02-05 DIAGNOSIS — F339 Major depressive disorder, recurrent, unspecified: Secondary | ICD-10-CM

## 2021-02-05 MED ORDER — DULOXETINE HCL 60 MG PO CPEP: 60.0000 mg | ORAL_CAPSULE | Freq: Every morning | ORAL | 1 refills | Status: DC

## 2021-02-05 MED ORDER — ARIPIPRAZOLE 5 MG PO TABS: 5.0000 mg | ORAL_TABLET | Freq: Every day | ORAL | 1 refills | Status: DC

## 2021-02-05 MED ORDER — BUPROPION HCL ER (XL) 300 MG PO TB24: 300.0000 mg | ORAL_TABLET | Freq: Every day | ORAL | 1 refills | Status: DC

## 2021-02-05 MED ORDER — DULOXETINE HCL 30 MG PO CPEP: ORAL_CAPSULE | ORAL | 1 refills | Status: DC

## 2021-02-05 NOTE — Progress Notes (Signed)
Megan Nunez [id number] 1945-12-02 76 y.o.  Subjective:   Patient ID:  Megan Nunez is a 76 y.o. (DOB 1945/01/29) female.  Chief Complaint:  Chief Complaint  Patient presents with   Follow-up    Depression, anxiety, insomnia, and ADHD    HPI Megan Nunez presents to the office today for follow-up of depression, anxiety, insomnia, and ADHD. She reports that she has had less depression. She is unsure if depression is in remission. She reports that she has been able to focus better with Abilify. "Anxiety is a lot better, I think." Energy and motivation have improved. She plans to start Noom. Denies any appetite changes since Abilify- "maybe a little more munchy." She reports that she is not sleeping enough and this may be due to "bad habits." Denies SI.   She reports that she and her husband went to [employer] around the holidays and enjoyed this. She reports that she and her husband are getting along better. She has started a project to improve her office and husband has been assisting her. She reports that she has been enjoying this. Has been decluttering.   Past Psychiatric Medication Trials: Sertraline Prozac Cymbalta Wellbutrin XL Adderall Adderall XR Vyvanse  AIMS    Flowsheet Row Office Visit from 02/05/2021 in [facility]  AIMS Total Score 0      PHQ2-9    Flowsheet Row Office Visit from 05/28/2020 in [facility] Office Visit from 04/10/2020 in [facility] Office Visit from 03/18/2020 in [facility] Office Visit from 07/06/2019 in [facility] Office Visit from 04/09/2019 in [facility]  PHQ-2 Total Score 0 1 0 0 2  PHQ-9 Total Score 0 -- -- -- 13      Flowsheet Row Admission (Discharged) from 08/05/2020 in [place] [facility] Admission (Discharged) from 04/15/2020 in [facility]  C-SSRS RISK CATEGORY No Risk No Risk        Review of Systems:  Review of  Systems  Musculoskeletal:  Negative for gait problem.       Getting ready to have shoulder surgery  Neurological:  Positive for tremors.       Denies any change in tremor  Psychiatric/Behavioral:         Please refer to HPI   Medications: I have reviewed the patient's current medications.  Current Outpatient Medications  Medication Sig Dispense Refill   ARIPiprazole (ABILIFY) 5 MG tablet Take 1 tablet (5 mg total) by mouth daily. 90 tablet 1   Ascorbic Acid (VITAMIN C PO) Take by mouth.     BIOTIN PO Take by mouth.     buPROPion (WELLBUTRIN XL) 300 MG 24 hr tablet Take 1 tablet (300 mg total) by mouth daily. 90 tablet 1   calamine lotion Apply 1 application topically as needed for itching.     Cholecalciferol (VITAMIN D) 50 MCG (2000 UT) tablet Take 2,000 Units by mouth daily.     DULoxetine (CYMBALTA) 30 MG capsule TAKE ONE CAPSULE BY MOUTH DAILY TAKE WITH 60 MG 90 capsule 1   DULoxetine (CYMBALTA) 60 MG capsule Take 1 capsule (60 mg total) by mouth every morning. Take with a 30 mg capsule to equal total dose of 90 mg 90 capsule 1   Ginkgo Biloba 120 MG TABS Take 120 mg by mouth daily.     OVER THE COUNTER MEDICATION Apply 1 application topically daily as needed (knee pain). CBD Cream (Patient not taking: Reported on  10/23/2020)     polyvinyl alcohol (LIQUIFILM TEARS) 1.4 % ophthalmic solution Place 1 drop into both eyes as needed for dry eyes. (Patient not taking: Reported on 10/23/2020)     predniSONE (DELTASONE) 10 MG tablet Take 3 tablets for 1 day, then 2 tablets for 3 days, then 1 table for 3 days. (Patient not taking: Reported on 12/04/2020) 12 tablet 0   triamcinolone cream (KENALOG) 0.1 % Apply sparingly to affected areas of skin twice daily for up to 10 days. 45 g 0   TURMERIC PO Take by mouth.     No current facility-administered medications for this visit.    Medication Side Effects: None  Allergies:  Allergies  Allergen Reactions   Nickel Itching and Rash    Past  Medical History:  Diagnosis Date   Anxiety    Arthritis    Depression    Family history of adverse reaction to anesthesia 2012   Mother has cardiac  arrest after surgery   Hepatitis    high antibodies for hepatitis   Obesity     Past Medical History, Surgical history, Social history, and Family history were reviewed and updated as appropriate.   Please see review of systems for further details on the patient's review from today.   Objective:   Physical Exam:  There were no vitals taken for this visit.  Physical Exam Constitutional:      General: She is not in acute distress. Musculoskeletal:        General: No deformity.  Neurological:     Mental Status: She is alert and oriented to person, place, and time.     Coordination: Coordination normal.  Psychiatric:        Attention and Perception: Attention and perception normal. She does not perceive auditory or visual hallucinations.        Mood and Affect: Mood normal. Mood is not anxious or depressed. Affect is not labile, blunt, angry or inappropriate.        Speech: Speech normal.        Behavior: Behavior normal.        Thought Content: Thought content normal. Thought content is not paranoid or delusional. Thought content does not include homicidal or suicidal ideation. Thought content does not include homicidal or suicidal plan.        Cognition and Memory: Cognition and memory normal.        Judgment: Judgment normal.     Comments: Insight intact    Lab Review:     Component Value Date/Time   NA 138 08/06/2020 0305   NA 141 04/10/2020 0927   K 4.5 08/06/2020 0305   CL 106 08/06/2020 0305   CO2 26 08/06/2020 0305   GLUCOSE 139 (H) 08/06/2020 0305   BUN 14 08/06/2020 0305   BUN 16 04/10/2020 0927   CREATININE 0.60 08/06/2020 0305   CREATININE 0.63 12/08/2016 1030   CALCIUM 8.8 (L) 08/06/2020 0305   PROT 7.5 07/18/2020 1344   PROT 6.9 04/10/2020 0927   ALBUMIN 4.5 07/18/2020 1344   ALBUMIN 4.7 04/10/2020 0927    AST 18 07/18/2020 1344   ALT 15 07/18/2020 1344   ALKPHOS 84 07/18/2020 1344   BILITOT 0.8 07/18/2020 1344   BILITOT 0.2 04/10/2020 0927   GFRNONAA >60 08/06/2020 0305   GFRAA 88 04/09/2019 1239       Component Value Date/Time   WBC 18.3 (H) 08/06/2020 0305   RBC 4.47 08/06/2020 0305   HGB 13.0 08/06/2020 0305  HGB 14.7 04/09/2019 1239   HCT 39.9 08/06/2020 0305   HCT 43.2 04/09/2019 1239   PLT 184 08/06/2020 0305   PLT 224 04/09/2019 1239   MCV 89.3 08/06/2020 0305   MCV 89 04/09/2019 1239   MCH 29.1 08/06/2020 0305   MCHC 32.6 08/06/2020 0305   RDW 13.0 08/06/2020 0305   RDW 12.8 04/09/2019 1239   LYMPHSABS 2.1 04/09/2019 1239   MONOABS 546 12/25/2015 1405   EOSABS 0.4 04/09/2019 1239   BASOSABS 0.1 04/09/2019 1239    No results found for: POCLITH, LITHIUM   No results found for: PHENYTOIN, PHENOBARB, VALPROATE, CBMZ   .res Assessment: Plan:   Pt seen for 30 minutes and time spent reviewing response to treatment and treatment plan. She feels that depressive s/s are currently in remission and concentration has also improved with Abilify. Will continue current plan of care since target signs and symptoms are well controlled without any tolerability issues. Continue Abilify 5 mg po qd for depression since she reports that this has been very helpful for depressive s/s and denies any side effects.  Continue Wellbutrin XL 300 mg po qd for depression. Continue Cymbalta 90 mg po qd for anxiety and depression.  Pt to follow-up in 3 months or sooner if clinically indicated.  Patient advised to contact office with any questions, adverse effects, or acute worsening in signs and symptoms.    Megan Nunez was seen today for follow-up.  Diagnoses and all orders for this visit:  Attention deficit hyperactivity disorder (ADHD), predominantly inattentive type  Recurrent major depressive disorder, in partial remission (HCC) -     buPROPion (WELLBUTRIN XL) 300 MG 24 hr tablet; Take 1  tablet (300 mg total) by mouth daily. -     DULoxetine (CYMBALTA) 30 MG capsule; TAKE ONE CAPSULE BY MOUTH DAILY TAKE WITH 60 MG -     DULoxetine (CYMBALTA) 60 MG capsule; Take 1 capsule (60 mg total) by mouth every morning. Take with a 30 mg capsule to equal total dose of 90 mg -     ARIPiprazole (ABILIFY) 5 MG tablet; Take 1 tablet (5 mg total) by mouth daily.     Please see After Visit Summary for patient specific instructions.  Future Appointments  Date Time Provider Department Center  02/12/2021  3:00 PM PFM-CCM PHARMACIST [facility]  02/24/2021  1:40 PM [facility] 1 [facility]  02/24/2021  2:00 PM [facility] 1 [facility]  04/15/2021  8:30 AM Ronnald Nian, MD [facility]  05/13/2021 10:30 AM Corie Chiquito, PMHNP [facility] None    No orders of the defined types were placed in this encounter.   -------------------------------

## 2021-02-05 NOTE — Chronic Care Management (AMB) (Signed)
Chronic Care Management Pharmacy Assistant   Name: Megan Nunez  MRN: [id number] DOB: 1945-11-17  Reason for Encounter: Chart review for initial encounter with Gaylord Shih Clinical Pharmacist on 02/12/21 at 3 pm in office.   Conditions to be addressed/monitored: Depression, Osteopenia, and Osteoarthritis  Recent office visits:  10/23/20 Joselyn Arrow, MD - Patient presented for Poison ivy dermatitis. Prescribed prednisone 10 mg and Triamcinolone 0.1% Stopped Asprin and Glucosamine  Recent consult visits:  12/04/20 Corie Chiquito PMHNP (Behavioral H) - Patient presented for Major depression. No other visit details available.  10/22/20 Dessie Coma, PT (Rehab) - Patient presented for Acute pain of left knee and other concerns.No medication changes noted.  09/19/20 Teryl Lucy (Orthopedic Surg) - Patient presented for Pain in right shoulder. No other visit details available.  09/15/20 Dessie Coma, PT (Rehab) - Patient presented for Acute pain of left knee and other concerns.No medication changes noted  08/18/20 Teryl Lucy (Orthopedic Surg) - Patient presented for aftercare following joint replacement surgery and other concerns. No other visit details available.   Hospital visits:  None in previous 6 months  Medications: Outpatient Encounter Medications as of 02/05/2021  Medication Sig   ARIPiprazole (ABILIFY) 5 MG tablet Take 1/2 tablet daily for 2 weeks, then may increase to 1 tablet daily if no significant improvement or side effects   Ascorbic Acid (VITAMIN C PO) Take by mouth.   BIOTIN PO Take by mouth.   buPROPion (WELLBUTRIN XL) 300 MG 24 hr tablet Take 1 tablet (300 mg total) by mouth daily.   calamine lotion Apply 1 application topically as needed for itching.   Cholecalciferol (VITAMIN D) 50 MCG (2000 UT) tablet Take 2,000 Units by mouth daily.   DULoxetine (CYMBALTA) 30 MG capsule TAKE ONE CAPSULE BY MOUTH DAILY TAKE WITH 60 MG   DULoxetine (CYMBALTA) 60 MG  capsule Take 1 capsule (60 mg total) by mouth every morning. Take with a 30 mg capsule to equal total dose of 90 mg   Ginkgo Biloba 120 MG TABS Take 120 mg by mouth daily.   OVER THE COUNTER MEDICATION Apply 1 application topically daily as needed (knee pain). CBD Cream (Patient not taking: Reported on 10/23/2020)   polyvinyl alcohol (LIQUIFILM TEARS) 1.4 % ophthalmic solution Place 1 drop into both eyes as needed for dry eyes. (Patient not taking: Reported on 10/23/2020)   predniSONE (DELTASONE) 10 MG tablet Take 3 tablets for 1 day, then 2 tablets for 3 days, then 1 table for 3 days. (Patient not taking: Reported on 12/04/2020)   triamcinolone cream (KENALOG) 0.1 % Apply sparingly to affected areas of skin twice daily for up to 10 days.   TURMERIC PO Take by mouth.   No facility-administered encounter medications on file as of 02/05/2021.  Fill History : aripiprazole 5 mg tablet 02/04/2021   BUPROPION HYDROCHLORIDE ER (XL)  300 MG TB24 01/19/2021 90   duloxetine 30 mg capsule,delayed release(DR/EC) 02/04/2021   duloxetine 60 mg capsule,delayed release(DR/EC) 02/04/2021   triamcinolone acetonide 0.1% cream 02/04/2021       Have you seen any other providers since your last visit? Patient reports she sees Corie Chiquito was recently changed from Vyvanse to Abilify.  Any side effects from any medications? Patient reports she has dry mouth  Do you have an symptoms or problems not managed by your medications? Patient reports she  has ADHD finds it difficult to get things done as well as some incontinence that she would  be interested in finding a solution that may not be medication for.  Any concerns about your health right now? Patient reports her husband worries about her with her health and blood pressures.  Has your provider asked that you check blood pressure, blood sugar, or follow special diet at home? Patient reports she has a blood pressure cuff that she uses at times at home.  Do  you get any type of exercise on a regular basis? Patient reports she just recently had both knees done and is getting around well now, but no formal exercise.  Can you think of a goal you would like to reach for your health? Patient reports Loosing weight, she has concerns her memory isn't what it should be, and her blood pressures could be better.  Do you have any problems getting your medications? Patient reports she is happy with her current pharmacy and has no financial barriers to obtaining her medications.  Is there anything that you would like to discuss during the appointment? Patient reports no  Patient assistance for the following mediations ; None   Patient aware to bring blood pressure cuff, medications that do not need refrigeration and supplements to appointment   Care Gaps: Flu Vaccine - Overdue Cologaurd - Overdue AWV- 3/22  Star Rating Drugs: None   Pamala Duffel CMA Clinical Pharmacist Assistant [phone]

## 2021-02-09 ENCOUNTER — Telehealth: Payer: Self-pay | Admitting: Family Medicine

## 2021-02-09 NOTE — Telephone Encounter (Signed)
Left message that dr [employer] needs to see pt before filling out surgical clearance form put in folder

## 2021-02-10 ENCOUNTER — Telehealth: Payer: Self-pay | Admitting: Pharmacist

## 2021-02-10 NOTE — Chronic Care Management (AMB) (Signed)
° ° °  Chronic Care Management Pharmacy Assistant   Name: Megan Nunez  MRN: [id number] DOB: 01-08-1946  02/10/21 APPOINTMENT REMINDER   Patient was reminded to have all medications, supplements and any blood glucose and blood pressure readings available for review with Gaylord Shih, Pharm. D, for office visit on 02/12/21 at 3.   Care Gaps: Flu Vaccine - Overdue Cologaurd - Overdue AWV- 3/22  Star Rating Drug: None  Any gaps in medications fill history?  None      Medications: Outpatient Encounter Medications as of 02/10/2021  Medication Sig   ARIPiprazole (ABILIFY) 5 MG tablet Take 1 tablet (5 mg total) by mouth daily.   Ascorbic Acid (VITAMIN C PO) Take by mouth.   BIOTIN PO Take by mouth.   buPROPion (WELLBUTRIN XL) 300 MG 24 hr tablet Take 1 tablet (300 mg total) by mouth daily.   calamine lotion Apply 1 application topically as needed for itching.   Cholecalciferol (VITAMIN D) 50 MCG (2000 UT) tablet Take 2,000 Units by mouth daily.   DULoxetine (CYMBALTA) 30 MG capsule TAKE ONE CAPSULE BY MOUTH DAILY TAKE WITH 60 MG   DULoxetine (CYMBALTA) 60 MG capsule Take 1 capsule (60 mg total) by mouth every morning. Take with a 30 mg capsule to equal total dose of 90 mg   Ginkgo Biloba 120 MG TABS Take 120 mg by mouth daily.   OVER THE COUNTER MEDICATION Apply 1 application topically daily as needed (knee pain). CBD Cream (Patient not taking: Reported on 10/23/2020)   polyvinyl alcohol (LIQUIFILM TEARS) 1.4 % ophthalmic solution Place 1 drop into both eyes as needed for dry eyes. (Patient not taking: Reported on 10/23/2020)   predniSONE (DELTASONE) 10 MG tablet Take 3 tablets for 1 day, then 2 tablets for 3 days, then 1 table for 3 days. (Patient not taking: Reported on 12/04/2020)   triamcinolone cream (KENALOG) 0.1 % Apply sparingly to affected areas of skin twice daily for up to 10 days.   TURMERIC PO Take by mouth.   No facility-administered encounter medications on file as of  02/10/2021.        Pamala Duffel CMA Clinical Pharmacist Assistant [phone]

## 2021-02-12 ENCOUNTER — Other Ambulatory Visit: Payer: Self-pay

## 2021-02-12 ENCOUNTER — Ambulatory Visit (INDEPENDENT_AMBULATORY_CARE_PROVIDER_SITE_OTHER): Payer: Medicare Other | Admitting: Pharmacist

## 2021-02-12 VITALS — BP 146/80

## 2021-02-12 DIAGNOSIS — M8580 Other specified disorders of bone density and structure, unspecified site: Secondary | ICD-10-CM

## 2021-02-12 DIAGNOSIS — F330 Major depressive disorder, recurrent, mild: Secondary | ICD-10-CM

## 2021-02-12 NOTE — Patient Instructions (Signed)
Hi Megan Nunez,  It was great to get to meet you in person! Below is a summary of some of the topics we discussed.  Don't forget to keep on checking your blood pressure at home and also continue doing those kegel exercises regularly. I attached some information on both that I hope is helpful!   Also make sure to only take one of the vitamin D supplements you have to avoid taking too much.  Please reach out to me if you have any questions or need anything before our follow up!  Best, Maddie  Gaylord Shih, PharmD, [facility] Clinical Pharmacist [facility] [phone]   Visit Information   Goals Addressed   None    Patient Care Plan: CCM Pharmacy Care Plan     Problem Identified: Problem: Depression, Osteopenia, Osteoarthritis, Overactive Bladder, and ADHD      Long-Range Goal: Patient-Specific Goal   Start Date: 02/12/2021  Expected End Date: 02/12/2022  This Visit's Progress: On track  Priority: High  Note:   Current Barriers:  Unable to independently monitor therapeutic efficacy  Pharmacist Clinical Goal(s):  Patient will achieve adherence to monitoring guidelines and medication adherence to achieve therapeutic efficacy through collaboration with PharmD and provider.   Interventions: 1:1 collaboration with Ronnald Nian, MD regarding development and update of comprehensive plan of care as evidenced by provider attestation and co-signature Inter-disciplinary care team collaboration (see longitudinal plan of care) Comprehensive medication review performed; medication list updated in electronic medical record  Depression (Goal: minimize symptoms) -Controlled -Current treatment: Bupropion XL 300 mg 1 tablet daily - Appropriate, Effective, Safe, Accessible Duloxetine 60 mg 1 capsule daily - Appropriate, Effective, Safe, Accessible Duloxetine 30 mg 1 capsule daily - Appropriate, Effective, Safe, Accessible -Medications previously tried/failed: sertraline,  fluoxetine -PHQ9: 0 -GAD7: n/a -Educated on Benefits of medication for symptom control Benefits of cognitive-behavioral therapy with or without medication -Counseled on diet and exercise extensively Recommended to continue current medication Counseled on benefits of exercise for mood.  ADHD (Goal: improve attention and completing tasks) -Controlled -Current treatment  Abilify 5 mg 1 tablet daily - Appropriate, Effective, Safe, Accessible -Medications previously tried: Adderall, Vyvanse  -Recommended to continue current medication  Osteopenia (Goal prevent fractures) -Controlled -Last DEXA Scan: 10/19/19   T-Score femoral neck: -1.1  T-Score total hip: n/a  T-Score lumbar spine: -0.1  T-Score forearm radius: n/a  10-year probability of major osteoporotic fracture: 14.1%  10-year probability of hip fracture: 1.7% -Patient is not a candidate for pharmacologic treatment -Current treatment  Vitamin D 2000 units daily (taking 2 of these on accident) - Appropriate, Effective, Query Safe, Accessible Multvitamin (1000 units of vitamin D, 300 mg of calcium) daily - Appropriate, Effective, Safe, Accessible -Medications previously tried: none  -Recommend [phone] units of vitamin D daily. Recommend 1200 mg of calcium daily from dietary and supplemental sources. Recommend weight-bearing and muscle strengthening exercises for building and maintaining bone density. -Counseled on diet and exercise extensively Recommended to continue current medication Recommended vitamin D level.  Overactive bladder (Goal: minimize frequency and urgency) -Not ideally controlled -Current treatment  No medications -Medications previously tried: solifenacin (did not want to take)  -Recommended kegel exercises and limiting caffeine intake.  Health Maintenance -Vaccine gaps: influenza -Current therapy:  Liquifilm tears 1.4% as needed Vitamin B complex daily Biotin 36144 mcg daily Gingko biloba 120 mg 1  tablet daily Turmeric 500 mg daily Multivitamin 1 tablet daily -Educated on Cost vs benefit of each product must be carefully weighed by individual  consumer -Patient is satisfied with current therapy and denies issues -Recommended trial off of turmeric as patient isn't sure it's still helping.  Patient Goals/Self-Care Activities Patient will:  - check blood pressure weekly, document, and provide at future appointments target a minimum of 150 minutes of moderate intensity exercise weekly  Follow Up Plan: The care management team will reach out to the patient again over the next 60 days.       Ms. Landry was given information about Chronic Care Management services today including:  CCM service includes personalized support from designated clinical staff supervised by her physician, including individualized plan of care and coordination with other care providers 24/7 contact phone numbers for assistance for urgent and routine care needs. Standard insurance, coinsurance, copays and deductibles apply for chronic care management only during months in which we provide at least 20 minutes of these services. Most insurances cover these services at 100%, however patients may be responsible for any copay, coinsurance and/or deductible if applicable. This service may help you avoid the need for more expensive face-to-face services. Only one practitioner may furnish and bill the service in a calendar month. The patient may stop CCM services at any time (effective at the end of the month) by phone call to the office staff.  Patient agreed to services and verbal consent obtained.   Patient verbalizes understanding of instructions and care plan provided today and agrees to view in MyChart. Active MyChart status confirmed with patient.   The pharmacy team will reach out to the patient again over the next 60 days.   Verner Chol, [facility]

## 2021-02-12 NOTE — Progress Notes (Signed)
Chronic Care Management Pharmacy Note  02/12/2021 Name:  Megan Nunez MRN:  [id number] DOB:  1945/03/28  Summary: BP is elevated in office and per some recent home readings  Recommendations/Changes made from today's visit: -Recommended checking BP at home at least weekly -Recommended trial off of turmeric as patient isn't sure it's still helping -Recommended kegel exercises daily  Plan: BP assessment in 1-2 months   Subjective: Megan Nunez is an 76 y.o. year old female who is a primary patient of Denita Lung, MD.  The CCM team was consulted for assistance with disease management and care coordination needs.    Engaged with patient face to face for initial visit in response to provider referral for pharmacy case management and/or care coordination services.   Consent to Services:  The patient was given the following information about Chronic Care Management services today, agreed to services, and gave verbal consent: 1. CCM service includes personalized support from designated clinical staff supervised by the primary care provider, including individualized plan of care and coordination with other care providers 2. 24/7 contact phone numbers for assistance for urgent and routine care needs. 3. Service will only be billed when office clinical staff spend 20 minutes or more in a month to coordinate care. 4. Only one practitioner may furnish and bill the service in a calendar month. 5.The patient may stop CCM services at any time (effective at the end of the month) by phone call to the office staff. 6. The patient will be responsible for cost sharing (co-pay) of up to 20% of the service fee (after annual deductible is met). Patient agreed to services and consent obtained.  Patient Care Team: Denita Lung, MD as PCP - General (Family Medicine) Viona Gilmore, [facility] as Pharmacist (Pharmacist)  Recent office visits: 10/23/20 Rita Ohara, MD - Patient presented for Poison ivy  dermatitis. Prescribed prednisone 10 mg and Triamcinolone 0.1% Stopped Asprin and Glucosamine.  06/06/20 Jill Alexanders, MD: Patient presented for sore on left leg.  Recent consult visits: 02/05/21 Thayer Headings PMHNP (Behavioral H) - Patient presented for Major depression and ADHD follow up. No other visit details available.  12/04/20 Thayer Headings PMHNP (Behavioral H) - Patient presented for Major depression. No other visit details available.   10/22/20 Carney Living, PT (Rehab) - Patient presented for Acute pain of left knee and other concerns.No medication changes noted.   09/19/20 Marchia Bond (Orthopedic Surg) - Patient presented for Pain in right shoulder. No other visit details available.   09/15/20 Carney Living, PT (Rehab) - Patient presented for Acute pain of left knee and other concerns.No medication changes noted   08/18/20 Marchia Bond (Orthopedic Surg) - Patient presented for aftercare following joint replacement surgery and other concerns. No other visit details available.    Hospital visits: None in previous 6 months   Objective:  Lab Results  Component Value Date   CREATININE 0.60 08/06/2020   BUN 14 08/06/2020   EGFR 94 04/10/2020   GFRNONAA >60 08/06/2020   GFRAA 88 04/09/2019   NA 138 08/06/2020   K 4.5 08/06/2020   CALCIUM 8.8 (L) 08/06/2020   CO2 26 08/06/2020   GLUCOSE 139 (H) 08/06/2020    No results found for: HGBA1C, FRUCTOSAMINE, GFR, MICROALBUR  Last diabetic Eye exam: No results found for: HMDIABEYEEXA  Last diabetic Foot exam: No results found for: HMDIABFOOTEX   Lab Results  Component Value Date   CHOL 172 04/10/2020   HDL  57 04/10/2020   LDLCALC 102 (H) 04/10/2020   TRIG 70 04/10/2020   CHOLHDL 3.0 04/10/2020    Hepatic Function Latest Ref Rng & Units 07/18/2020 04/10/2020 04/09/2019  Total Protein 6.5 - 8.1 g/dL 7.5 6.9 7.0  Albumin 3.5 - 5.0 g/dL 4.5 4.7 4.5  AST 15 - 41 U/L _0 ALT 0 - 44 U/L _1 Alk Phosphatase  38 - 126 U/L 84 117 115  Total Bilirubin 0.3 - 1.2 mg/dL 0.8 0.2 0.2    No results found for: TSH, FREET4  CBC Latest Ref Rng & Units 08/06/2020 07/18/2020 04/16/2020  WBC 4.0 - 10.5 K/uL 18.3(H) 7.1 14.2(H)  Hemoglobin 12.0 - 15.0 g/dL 13.0 14.7 11.0(L)  Hematocrit 36.0 - 46.0 % 39.9 46.3(H) 33.8(L)  Platelets 150 - 400 K/uL 184 224 153    No results found for: VD25OH  Clinical ASCVD: No  The 10-year ASCVD risk score (Arnett DK, et al., 2019) is: 16.1%   Values used to calculate the score:     Age: 68 years     Sex: Female     Is Non-Hispanic African American: No     Diabetic: No     Tobacco smoker: No     Systolic Blood Pressure: 517 mmHg     Is BP treated: No     HDL Cholesterol: 57 mg/dL     Total Cholesterol: 172 mg/dL    Depression screen [facility] 2/9 05/28/2020 04/10/2020 03/18/2020  Decreased Interest 0 0 0  Down, Depressed, Hopeless 0 1 0  PHQ - 2 Score 0 1 0  Altered sleeping 0 - -  Tired, decreased energy 0 - -  Change in appetite 0 - -  Feeling bad or failure about yourself  0 - -  Trouble concentrating 0 - -  Moving slowly or fidgety/restless 0 - -  Suicidal thoughts 0 - -  PHQ-9 Score 0 - -  Difficult doing work/chores Not difficult at all - -     Social History   Tobacco Use  Smoking Status Never  Smokeless Tobacco Never   BP Readings from Last 3 Encounters:  02/12/21 (!) 146/80  12/04/20 125/76  10/23/20 140/80   Pulse Readings from Last 3 Encounters:  12/04/20 75  10/23/20 68  08/06/20 72   Wt Readings from Last 3 Encounters:  10/23/20 156 lb 12.8 oz (71.1 kg)  08/05/20 162 lb (73.5 kg)  07/18/20 159 lb (72.1 kg)   BMI Readings from Last 3 Encounters:  10/23/20 31.14 kg/m  08/05/20 32.72 kg/m  07/18/20 31.58 kg/m    Assessment/Interventions: Review of patient past medical history, allergies, medications, health status, including review of consultants reports, laboratory and other test data, was performed as part of comprehensive evaluation  and provision of chronic care management services.   SDOH:  (Social Determinants of Health) assessments and interventions performed: Yes SDOH Interventions    Flowsheet Row Most Recent Value  SDOH Interventions   Financial Strain Interventions Intervention Not Indicated  Transportation Interventions Intervention Not Indicated      SDOH Screenings   Alcohol Screen: Not on file  Depression (PHQ2-9): Low Risk    PHQ-2 Score: 0  Financial Resource Strain: Low Risk    Difficulty of Paying Living Expenses: Not hard at all  Food Insecurity: Not on file  Housing: Not on file  Physical Activity: Not on file  Social Connections: Not on file  Stress: Not on file  Tobacco Use: Low Risk  Smoking Tobacco Use: Never   Smokeless Tobacco Use: Never   Passive Exposure: Not on file  Transportation Needs: No Transportation Needs   Lack of Transportation (Medical): No   Lack of Transportation (Non-Medical): No   Patient reports she has some concerns about her health and her husband has some concerns about her health. She is motivated to try to be healthy and take care of herself but finds it challenging sometimes with her husband. He does not want to go to the gym and does not and she usually doesn't go because he doesn't go. She is hoping to start regularly going to [place] once she has her shoulder surgery. She does enjoy swimming and dancing and dances around the house some already.  Patient loves books and finds it frustrating when she is not able to find the right words. Patient also loves to sing and is in a choir.   Patient's husband is concerned about her blood pressure. She just started checking sporadically about 5 months ago with her wrist cuff. Her husband has high blood pressure as well but also had quadruple bypass surgery. She usually checks her BP first thing in the morning and before getting up. Here are her recent BP readings: 123/72, 94/63, 116/62, 143/79, 132/74, 135/80, 106/68,  116/73, 140/89.  Patient is also concerned with her incontinence. She usually goes to the bathroom frequently during the day and about once during the night. She doesn't want to try medications right now. She does do kegel exercises but not very often and does drink diet coke with caffeine about 2 cans a day. Patient wants to try a cream to help as her friend did that and it helped. She also doesn't drink much water during the day.  Patient seeps about 6 hours a night but tries to sleep 8 hours. She has always been a morning person and married a night owl and started adapting to his lifestyle with retirement. She feels tired some during the day and has taken a nap sometimes but cannot sleep for more than 1 hour otherwise she won't get out of bed.  Patient denies any problems with her medications other than her constant dry mouth, which she attributes to her antidepressants. She has been on these since her 66s and has gone off 2-3 times and she lost a job because of it each time. She just started using Biotene mouthwash which is helping.   CCM Care Plan  Allergies  Allergen Reactions   Nickel Itching and Rash    Medications Reviewed Today     Reviewed by Viona Gilmore, [facility] (Pharmacist) on 02/12/21 at 1609  Med List Status: <None>   Medication Order Taking? Sig Documenting Provider Last Dose Status Informant  ARIPiprazole (ABILIFY) 5 MG tablet [id number] Yes Take 1 tablet (5 mg total) by mouth daily. Thayer Headings, PMHNP Taking Active   B Complex-C (B-COMPLEX WITH VITAMIN C) tablet [id number] Yes Take 1 tablet by mouth daily. [provider] Taking Active   BIOTIN PO [id number] Yes Take 10,000 mcg by mouth daily. [provider] Taking Active   buPROPion (WELLBUTRIN XL) 300 MG 24 hr tablet [id number] Yes Take 1 tablet (300 mg total) by mouth daily. Thayer Headings, PMHNP Taking Active   Cholecalciferol (VITAMIN D) 50 MCG (2000 UT) tablet [id number] Yes Take 2,000 Units by  mouth daily. [provider] Taking Active Self  DULoxetine (CYMBALTA) 30 MG capsule [id number] Yes TAKE ONE CAPSULE BY MOUTH DAILY TAKE WITH 60 MG Carter,  Janett Billow, PMHNP Taking Active   DULoxetine (CYMBALTA) 60 MG capsule [id number] Yes Take 1 capsule (60 mg total) by mouth every morning. Take with a 30 mg capsule to equal total dose of 90 mg Thayer Headings, PMHNP Taking Active   Ginkgo Biloba 120 MG TABS [id number] Yes Take 120 mg by mouth daily. [provider] Taking Active   polyvinyl alcohol (LIQUIFILM TEARS) 1.4 % ophthalmic solution [id number] Yes Place 1 drop into both eyes as needed for dry eyes. [provider] Taking Active Self  TURMERIC PO [id number] Yes Take 500 mg by mouth daily. [provider] Taking Active             Patient Active Problem List   Diagnosis Date Noted   S/P TKR (total knee replacement), left 08/05/2020   Skin infection 05/28/2020   Wound of skin 05/28/2020   S/P knee surgery 05/28/2020   Eczematous dermatitis of upper eyelids of both eyes 05/28/2020   Osteoarthritis of left knee 05/27/2020   S/P total knee replacement, right 04/15/2020   Osteopenia 04/10/2020   Major depressive disorder, recurrent episode, mild ([place]) 10/31/2017   ADHD, predominantly inattentive type 10/31/2017   Primary osteoarthritis of right knee 09/09/2016   Primary osteoarthritis, right shoulder 09/09/2016   Arthritis 07/25/2012   Tinnitus 07/25/2012   Mixed incontinence urge and stress 07/25/2012    Immunization History  Administered Date(s) Administered   Fluad Quad(high Dose 65+) 01/01/2020   Influenza Split 01/27/2011   Influenza, High Dose Seasonal PF 11/14/2013, 11/05/2014, 12/25/2015, 09/23/2016, 01/03/2018   Influenza,inj,Quad PF,6+ Mos 03/20/2013   PFIZER(Purple Top)SARS-COV-2 Vaccination 02/25/2019, 03/22/2019, 11/30/2019   Pfizer Covid-19 Vaccine Bivalent Booster 32yrs & up 09/26/2020   Pneumococcal Conjugate-13 07/12/2013    Pneumococcal Polysaccharide-23 11/05/2014   Tdap 08/27/2008, 02/12/2018   Zoster Recombinat (Shingrix) 02/12/2018, 08/04/2018   Zoster, Live 12/30/2008    Conditions to be addressed/monitored:  Depression, Osteopenia, Osteoarthritis, Overactive Bladder, and ADHD  Care Plan : [place]  Updates made by Viona Gilmore, [place] since 02/12/2021 12:00 AM     Problem: Problem: Depression, Osteopenia, Osteoarthritis, Overactive Bladder, and ADHD      Long-Range Goal: Patient-Specific Goal   Start Date: 02/12/2021  Expected End Date: 02/12/2022  This Visit's Progress: On track  Priority: High  Note:   Current Barriers:  Unable to independently monitor therapeutic efficacy  Pharmacist Clinical Goal(s):  Patient will achieve adherence to monitoring guidelines and medication adherence to achieve therapeutic efficacy through collaboration with PharmD and provider.   Interventions: 1:1 collaboration with Denita Lung, MD regarding development and update of comprehensive plan of care as evidenced by provider attestation and co-signature Inter-disciplinary care team collaboration (see longitudinal plan of care) Comprehensive medication review performed; medication list updated in electronic medical record  Depression (Goal: minimize symptoms) -Controlled -Current treatment: Bupropion XL 300 mg 1 tablet daily - Appropriate, Effective, Safe, Accessible Duloxetine 60 mg 1 capsule daily - Appropriate, Effective, Safe, Accessible Duloxetine 30 mg 1 capsule daily - Appropriate, Effective, Safe, Accessible -Medications previously tried/failed: sertraline, fluoxetine -PHQ9: 0 -GAD7: n/a -Educated on Benefits of medication for symptom control Benefits of cognitive-behavioral therapy with or without medication -Counseled on diet and exercise extensively Recommended to continue current medication Counseled on benefits of exercise for mood.  ADHD (Goal: improve attention and  completing tasks) -Controlled -Current treatment  Abilify 5 mg 1 tablet daily - Appropriate, Effective, Safe, Accessible -Medications previously tried: Adderall, Vyvanse  -Recommended to continue current medication  Osteopenia (Goal  prevent fractures) -Controlled -Last DEXA Scan: 10/19/19   T-Score femoral neck: -1.1  T-Score total hip: n/a  T-Score lumbar spine: -0.1  T-Score forearm radius: n/a  10-year probability of major osteoporotic fracture: 14.1%  10-year probability of hip fracture: 1.7% -Patient is not a candidate for pharmacologic treatment -Current treatment  Vitamin D 2000 units daily (taking 2 of these on accident) - Appropriate, Effective, Query Safe, Accessible Multvitamin (1000 units of vitamin D, 300 mg of calcium) daily - Appropriate, Effective, Safe, Accessible -Medications previously tried: none  -Recommend [phone] units of vitamin D daily. Recommend 1200 mg of calcium daily from dietary and supplemental sources. Recommend weight-bearing and muscle strengthening exercises for building and maintaining bone density. -Counseled on diet and exercise extensively Recommended to continue current medication Recommended vitamin D level.  Overactive bladder (Goal: minimize frequency and urgency) -Not ideally controlled -Current treatment  No medications -Medications previously tried: solifenacin (did not want to take)  -Recommended kegel exercises and limiting caffeine intake.  Health Maintenance -Vaccine gaps: influenza -Current therapy:  Liquifilm tears 1.4% as needed Vitamin B complex daily Biotin 10000 mcg daily Gingko biloba 120 mg 1 tablet daily Turmeric 500 mg daily Multivitamin 1 tablet daily -Educated on Cost vs benefit of each product must be carefully weighed by individual consumer -Patient is satisfied with current therapy and denies issues -Recommended trial off of turmeric as patient isn't sure it's still helping.  Patient Goals/Self-Care  Activities Patient will:  - check blood pressure weekly, document, and provide at future appointments target a minimum of 150 minutes of moderate intensity exercise weekly  Follow Up Plan: The care management team will reach out to the patient again over the next 60 days.        Medication Assistance: None required.  Patient affirms current coverage meets needs.  Compliance/Adherence/Medication fill history: Care Gaps: Cologuard, influenza  Star-Rating Drugs: None  Patient's preferred pharmacy is:  [facility] [id number] - [address], [place] [address] Phone: [phone] Fax: [phone]  Uses pill box? Yes Pt endorses 100% compliance  We discussed: Current pharmacy is preferred with insurance plan and patient is satisfied with pharmacy services Patient decided to: Continue current medication management strategy  Care Plan and Follow Up Patient Decision:  Patient agrees to Care Plan and Follow-up.  Plan: The care management team will reach out to the patient again over the next 60 days.  Jeni Salles, PharmD, [facility] [phone]

## 2021-02-13 ENCOUNTER — Ambulatory Visit (INDEPENDENT_AMBULATORY_CARE_PROVIDER_SITE_OTHER): Payer: Medicare Other | Admitting: Family Medicine

## 2021-02-13 ENCOUNTER — Encounter: Payer: Self-pay | Admitting: Family Medicine

## 2021-02-13 VITALS — BP 128/80 | HR 73 | Temp 97.5°F | Ht 60.0 in | Wt 167.2 lb

## 2021-02-13 DIAGNOSIS — Z23 Encounter for immunization: Secondary | ICD-10-CM | POA: Diagnosis not present

## 2021-02-13 DIAGNOSIS — Z01818 Encounter for other preprocedural examination: Secondary | ICD-10-CM | POA: Diagnosis not present

## 2021-02-13 LAB — POCT GLYCOSYLATED HEMOGLOBIN (HGB A1C): Hemoglobin A1C: 5.4 % (ref 4.0–5.6)

## 2021-02-13 NOTE — Progress Notes (Signed)
° °  Subjective:    Patient ID: CYNDY ACQUISTO, female    DOB: 06-15-1945, 76 y.o.   MRN: [id number]  HPI She is here for preoperative clearance prior to having shoulder surgery. She has no underlying history of cardiac or pulmonary issues.  Did have an EKG done in March of last year which was negative.  Review of Systems     Objective:   Physical Exam Alert and in no distress. Tympanic membranes and canals are normal. Pharyngeal area is normal. Neck is supple without adenopathy or thyromegaly. Cardiac exam shows a regular sinus rhythm without murmurs or gallops. Lungs are clear to auscultation.        Assessment & Plan:  Pre-operative clearance - Plan: POCT glycosylated hemoglobin (Hb A1C)  Need for influenza vaccination - Plan: Flu Vaccine QUAD High Dose(Fluad) I see no medical contraindication.  Having surgery.

## 2021-02-17 DIAGNOSIS — F330 Major depressive disorder, recurrent, mild: Secondary | ICD-10-CM | POA: Diagnosis not present

## 2021-02-24 ENCOUNTER — Ambulatory Visit
Admission: RE | Admit: 2021-02-24 | Discharge: 2021-02-24 | Disposition: A | Discharge status: Home / Self Care | Payer: Medicare Other | Source: Ambulatory Visit | Attending: Orthopedic Surgery | Admitting: Orthopedic Surgery

## 2021-02-24 ENCOUNTER — Other Ambulatory Visit: Payer: Self-pay

## 2021-02-24 DIAGNOSIS — M25511 Pain in right shoulder: Secondary | ICD-10-CM

## 2021-02-24 DIAGNOSIS — M19012 Primary osteoarthritis, left shoulder: Secondary | ICD-10-CM | POA: Diagnosis not present

## 2021-02-24 DIAGNOSIS — M25711 Osteophyte, right shoulder: Secondary | ICD-10-CM | POA: Diagnosis not present

## 2021-02-24 DIAGNOSIS — M25512 Pain in left shoulder: Secondary | ICD-10-CM

## 2021-02-24 DIAGNOSIS — M19011 Primary osteoarthritis, right shoulder: Secondary | ICD-10-CM | POA: Diagnosis not present

## 2021-02-24 DIAGNOSIS — Z01818 Encounter for other preprocedural examination: Secondary | ICD-10-CM | POA: Diagnosis not present

## 2021-03-10 NOTE — Progress Notes (Signed)
Sent message, via epic in basket, requesting orders in epic from surgeon.  

## 2021-03-16 DIAGNOSIS — M19011 Primary osteoarthritis, right shoulder: Secondary | ICD-10-CM | POA: Diagnosis not present

## 2021-03-18 NOTE — Patient Instructions (Addendum)
DUE TO COVID-19 ONLY ONE VISITOR IS ALLOWED TO COME WITH YOU AND STAY IN THE WAITING ROOM ONLY DURING PRE OP AND PROCEDURE.   **NO VISITORS ARE ALLOWED IN THE SHORT STAY AREA OR RECOVERY ROOM!!**  IF YOU WILL BE ADMITTED INTO THE HOSPITAL YOU ARE ALLOWED ONLY TWO SUPPORT PEOPLE DURING VISITATION HOURS ONLY (7 AM -8PM)   The support person(s) must pass our screening, gel in and out, and wear a mask at all times, including in the patients room. Patients must also wear a mask when staff or their support person are in the room. Visitors GUEST BADGE MUST BE WORN VISIBLY  One adult visitor may remain with you overnight and MUST be in the room by 8 P.M.  No visitors under the age of 75. Any visitor under the age of 17 must be accompanied by an adult.    COVID SWAB TESTING MUST BE COMPLETED ON:  03/27/21 @ 9:45 AM   Site: [facility] Enter: Main Entrance have a seat in the waiting area to the right of main entrance (DO NOT [place]!!!!!) Dial: [phone] to alert staff you have arrived  You are not required to quarantine, however you are required to wear a well-fitted mask when you are out and around people not in your household.  Hand Hygiene often Do NOT share personal items Notify your provider if you are in close contact with someone who has COVID or you develop fever 100.4 or greater, new onset of sneezing, cough, sore throat, shortness of breath or body aches.   Your procedure is scheduled on: 03/31/21   Report to [facility] Main Entrance    Report to admitting at 8:00 AM   Call this number if you have problems the morning of surgery [phone]   Do not eat food :After Midnight.   After Midnight you may have the following liquids until 7:40 AM DAY OF SURGERY  Water Black Coffee (sugar ok, NO MILK/CREAM OR CREAMERS)  Tea (sugar ok, NO MILK/CREAM OR CREAMERS) regular and decaf                             Plain  Jell-O (NO RED)                                           Fruit ices (not with fruit pulp, NO RED)                                     Popsicles (NO RED)                                                                  Juice: apple, WHITE grape, WHITE cranberry Sports drinks like Gatorade (NO RED) Clear broth(vegetable,chicken,beef)   The day of surgery:  Drink ONE (1) Pre-Surgery Clear Ensure at 7:40 AM the morning of surgery. Drink in one sitting. Do not sip.  This drink was given to you during your hospital  pre-op  appointment visit. Nothing else to drink after completing the  Pre-Surgery Clear Ensure.          If you have questions, please contact your surgeons office.   FOLLOW BOWEL PREP AND ANY ADDITIONAL PRE OP INSTRUCTIONS YOU RECEIVED FROM YOUR SURGEON'S OFFICE!!!     Oral Hygiene is also important to reduce your risk of infection.                                    Remember - BRUSH YOUR TEETH THE MORNING OF SURGERY WITH YOUR REGULAR TOOTHPASTE   Stop all vitamins and supplements 7 days before surgery.   Take these medicines the morning of surgery with A SIP OF WATER: Abilify, Bupropion, Duloxetine                              You may not have any metal on your body including hair pins, jewelry, and body piercing             Do not wear make-up, lotions, powders, perfumes, or deodorant  Do not wear nail polish including gel and S&S, artificial/acrylic nails, or any other type of covering on natural nails including finger and toenails. If you have artificial nails, gel coating, etc. that needs to be removed by a nail salon please have this removed prior to surgery or surgery may need to be canceled/ delayed if the surgeon/ anesthesia feels like they are unable to be safely monitored.   Do not shave  48 hours prior to surgery.    Do not bring valuables to the hospital. [place].   Bring small overnight bag day of  surgery.    Special Instructions: Bring a copy of your healthcare power of attorney and living will documents         the day of surgery if you haven't scanned them before.              Please read over the following fact sheets you were given: IF YOU HAVE QUESTIONS ABOUT YOUR PRE-OP INSTRUCTIONS PLEASE CALL [facility] - Preparing for Surgery Before surgery, you can play an important role.  Because skin is not sterile, your skin needs to be as free of germs as possible.  You can reduce the number of germs on your skin by washing with CHG (chlorahexidine gluconate) soap before surgery.  CHG is an antiseptic cleaner which kills germs and bonds with the skin to continue killing germs even after washing. Please DO NOT use if you have an allergy to CHG or antibacterial soaps.  If your skin becomes reddened/irritated stop using the CHG and inform your nurse when you arrive at Short Stay. Do not shave (including legs and underarms) for at least 48 hours prior to the first CHG shower.  You may shave your face/neck.  Please follow these instructions carefully:  1.  Shower with CHG Soap the night before surgery and the  morning of surgery.  2.  If you choose to wash your hair, wash your hair first as usual with your normal  shampoo.  3.  After you shampoo, rinse your hair and body thoroughly to remove the shampoo.  4.  Use CHG as you would any other liquid soap.  You can apply chg directly to the skin and wash.  Gently with a scrungie or clean washcloth.  5.  Apply the CHG Soap to your body ONLY FROM THE NECK DOWN.   Do   not use on face/ open                           Wound or open sores. Avoid contact with eyes, ears mouth and   genitals (private parts).                       Wash face,  Genitals (private parts) with your normal soap.             6.  Wash thoroughly, paying special attention to the area where your    surgery  will be performed.  7.   Thoroughly rinse your body with warm water from the neck down.  8.  DO NOT shower/wash with your normal soap after using and rinsing off the CHG Soap.                9.  Pat yourself dry with a clean towel.            10.  Wear clean pajamas.            11.  Place clean sheets on your bed the night of your first shower and do not  sleep with pets. Day of Surgery : Do not apply any lotions/deodorants the morning of surgery.  Please wear clean clothes to the hospital/surgery center.  FAILURE TO FOLLOW THESE INSTRUCTIONS MAY RESULT IN THE CANCELLATION OF YOUR SURGERY  PATIENT SIGNATURE_________________________________  NURSE SIGNATURE__________________________________  ________________________________________________________________________   Adam Phenix  An incentive spirometer is a tool that can help keep your lungs clear and active. This tool measures how well you are filling your lungs with each breath. Taking long deep breaths may help reverse or decrease the chance of developing breathing (pulmonary) problems (especially infection) following: A long period of time when you are unable to move or be active. BEFORE THE PROCEDURE  If the spirometer includes an indicator to show your best effort, your nurse or respiratory therapist will set it to a desired goal. If possible, sit up straight or lean slightly forward. Try not to slouch. Hold the incentive spirometer in an upright position. INSTRUCTIONS FOR USE  Sit on the edge of your bed if possible, or sit up as far as you can in bed or on a chair. Hold the incentive spirometer in an upright position. Breathe out normally. Place the mouthpiece in your mouth and seal your lips tightly around it. Breathe in slowly and as deeply as possible, raising the piston or the ball toward the top of the column. Hold your breath for 3-5 seconds or for as long as possible. Allow the piston or ball to fall to the bottom of the column. Remove  the mouthpiece from your mouth and breathe out normally. Rest for a few seconds and repeat Steps 1 through 7 at least 10 times every 1-2 hours when you are awake. Take your time and take a few normal breaths between deep breaths. The spirometer may include an indicator to show your best effort. Use the indicator as a goal to work toward during each repetition. After each set of 10 deep breaths, practice coughing to be sure  your lungs are clear. If you have an incision (the cut made at the time of surgery), support your incision when coughing by placing a pillow or rolled up towels firmly against it. Once you are able to get out of bed, walk around indoors and cough well. You may stop using the incentive spirometer when instructed by your caregiver.  RISKS AND COMPLICATIONS Take your time so you do not get dizzy or light-headed. If you are in pain, you may need to take or ask for pain medication before doing incentive spirometry. It is harder to take a deep breath if you are having pain. AFTER USE Rest and breathe slowly and easily. It can be helpful to keep track of a log of your progress. Your caregiver can provide you with a simple table to help with this. If you are using the spirometer at home, follow these instructions: [place] IF:  You are having difficultly using the spirometer. You have trouble using the spirometer as often as instructed. Your pain medication is not giving enough relief while using the spirometer. You develop fever of 100.5 F (38.1 C) or higher. SEEK IMMEDIATE MEDICAL CARE IF:  You cough up bloody sputum that had not been present before. You develop fever of 102 F (38.9 C) or greater. You develop worsening pain at or near the incision site. MAKE SURE YOU:  Understand these instructions. Will watch your condition. Will get help right away if you are not doing well or get worse. Document Released: 05/17/2006 Document Revised: 03/29/2011 Document Reviewed:  07/18/2006 ExitCare Patient Information 2014 [place].   ________________________________________________________________________ [facility]- Preparing for Total Shoulder Arthroplasty    Before surgery, you can play an important role. Because skin is not sterile, your skin needs to be as free of germs as possible. You can reduce the number of germs on your skin by using the following products. Benzoyl Peroxide Gel Reduces the number of germs present on the skin Applied twice a day to shoulder area starting two days before surgery    ==================================================================  Please follow these instructions carefully:  BENZOYL PEROXIDE 5% GEL  Please do not use if you have an allergy to benzoyl peroxide.   If your skin becomes reddened/irritated stop using the benzoyl peroxide.  Starting two days before surgery, apply as follows: Apply benzoyl peroxide in the morning and at night. Apply after taking a shower. If you are not taking a shower clean entire shoulder front, back, and side along with the armpit with a clean wet washcloth.  Place a quarter-sized dollop on your shoulder and rub in thoroughly, making sure to cover the front, back, and side of your shoulder, along with the armpit.   2 days before ____ AM   ____ PM              1 day before ____ AM   ____ PM                         Do this twice a day for two days.  (Last application is the night before surgery, AFTER using the CHG soap as described below).  Do NOT apply benzoyl peroxide gel on the day of surgery.

## 2021-03-18 NOTE — Progress Notes (Addendum)
COVID swab appointment: 03/27/21 @ 0945 ? ?COVID Vaccine Completed: yes x4 ?Date COVID Vaccine completed: 02/25/19, 03/22/19 ?Has received booster: 11/30/19, 09/26/20 ?COVID vaccine manufacturer: Pfizer     ? ?Date of COVID positive in last 90 days: no ? ?PCP - Sharlot Gowda, MD ?Cardiologist - n/a ? ?Chest x-ray - n/a ?EKG - greater than 1 year ?Stress Test - yes long time per pt ?ECHO - n/a ?Cardiac Cath - n/a ?Pacemaker/ICD device last checked: n/a ?Spinal Cord Stimulator: n/a ? ?Bowel Prep - no ? ?Sleep Study - n/a ?CPAP -  ? ?Fasting Blood Sugar - n/a ?Checks Blood Sugar _____ times a day ? ?Blood Thinner Instructions:n/a ?Aspirin Instructions: ?Last Dose: ? ?Activity level: Can go up a flight of stairs and perform activities of daily living without stopping and without symptoms of chest pain or shortness of breath. ?   ?Anesthesia review:  ? ?Patient denies shortness of breath, fever, cough and chest pain at PAT appointment ? ? ?Patient verbalized understanding of instructions that were given to them at the PAT appointment. Patient was also instructed that they will need to review over the PAT instructions again at home before surgery.  ?

## 2021-03-19 ENCOUNTER — Encounter (HOSPITAL_COMMUNITY)
Admission: RE | Admit: 2021-03-19 | Discharge: 2021-03-19 | Disposition: A | Discharge status: Home / Self Care | Payer: Medicare Other | Source: Ambulatory Visit | Attending: Orthopedic Surgery | Admitting: Orthopedic Surgery

## 2021-03-19 ENCOUNTER — Encounter (HOSPITAL_COMMUNITY): Payer: Self-pay

## 2021-03-19 ENCOUNTER — Other Ambulatory Visit: Payer: Self-pay

## 2021-03-19 VITALS — BP 135/79 | HR 93 | Temp 97.6°F | Resp 16 | Ht 60.0 in | Wt 165.0 lb

## 2021-03-19 DIAGNOSIS — Z01812 Encounter for preprocedural laboratory examination: Secondary | ICD-10-CM | POA: Insufficient documentation

## 2021-03-19 DIAGNOSIS — K759 Inflammatory liver disease, unspecified: Secondary | ICD-10-CM | POA: Diagnosis not present

## 2021-03-19 DIAGNOSIS — Z01818 Encounter for other preprocedural examination: Secondary | ICD-10-CM

## 2021-03-19 LAB — COMPREHENSIVE METABOLIC PANEL
ALT: 15 U/L (ref 0–44)
AST: 19 U/L (ref 15–41)
Albumin: 4.1 g/dL (ref 3.5–5.0)
Alkaline Phosphatase: 75 U/L (ref 38–126)
Anion gap: 6 (ref 5–15)
BUN: 12 mg/dL (ref 8–23)
CO2: 28 mmol/L (ref 22–32)
Calcium: 9.2 mg/dL (ref 8.9–10.3)
Chloride: 103 mmol/L (ref 98–111)
Creatinine, Ser: 0.65 mg/dL (ref 0.44–1.00)
GFR, Estimated: >60 mL/min (ref >60)
Glucose, Bld: 96 mg/dL (ref 70–99)
Potassium: 4 mmol/L (ref 3.5–5.1)
Sodium: 137 mmol/L (ref 135–145)
Total Bilirubin: 0.6 mg/dL (ref 0.3–1.2)
Total Protein: 7.1 g/dL (ref 6.5–8.1)

## 2021-03-19 LAB — SURGICAL PCR SCREEN
MRSA, PCR: NEGATIVE
Staphylococcus aureus: NEGATIVE

## 2021-03-19 LAB — CBC
HCT: 45.2 % (ref 36.0–46.0)
Hemoglobin: 14.8 g/dL (ref 12.0–15.0)
MCH: 29.6 pg (ref 26.0–34.0)
MCHC: 32.7 g/dL (ref 30.0–36.0)
MCV: 90.4 fL (ref 80.0–100.0)
Platelets: 211 10*3/uL (ref 150–400)
RBC: 5 MIL/uL (ref 3.87–5.11)
RDW: 13.8 % (ref 11.5–15.5)
WBC: 7.6 10*3/uL (ref 4.0–10.5)
nRBC: 0 % (ref 0.0–0.2)

## 2021-03-19 NOTE — H&P (Signed)
SHOULDER ARTHROPLASTY ADMISSION H&P ? ?Patient ID: ?[name] ?MRN: [id number] ?DOB/AGE: 03/02/1945 76 y.o. ? ?Chief Complaint: right shoulder pain. ? ?Planned Procedure Date: 03/31/21 ?Medical Clearance by Dr. Redmond School   ? ? ?HPI: ?Megan Nunez is a 76 y.o. female who presents for evaluation of djd right shoulder. The patient has a history of pain and functional disability in the right shoulder due to arthritis and has failed non-surgical conservative treatments for greater than 12 weeks to include NSAID's and/or analgesics, corticosteriod injections, flexibility and strengthening excercises, and activity modification.  Onset of symptoms was gradual, starting 7 years ago with gradually worsening course since that time. The patient noted no past surgery on the right shoulder.  Patient currently rates pain at 7 out of 10 with activity. Patient has worsening of pain with activity and weight bearing and pain that interferes with activities of daily living.  Patient has evidence of joint space narrowing by imaging studies.  There is no active infection. ? ?Past Medical History:  ?Diagnosis Date  ? Anxiety   ? Arthritis   ? Depression   ? Family history of adverse reaction to anesthesia 2012  ? Mother has cardiac  arrest after surgery  ? Hepatitis   ? high antibodies for hepatitis  ? Obesity   ? ?Past Surgical History:  ?Procedure Laterality Date  ? LAPAROSCOPIC TUBAL LIGATION  1985  ? TOTAL KNEE ARTHROPLASTY Right 04/15/2020  ? Procedure: TOTAL KNEE ARTHROPLASTY;  Surgeon: Marchia Bond, MD;  Location: [facility];  Service: Orthopedics;  Laterality: Right;  ? TOTAL KNEE ARTHROPLASTY Left 08/05/2020  ? Procedure: TOTAL KNEE ARTHROPLASTY;  Surgeon: Marchia Bond, MD;  Location: [facility];  Service: Orthopedics;  Laterality: Left;  ? WISDOM TOOTH EXTRACTION    ? age 59  ? ?Allergies  ?Allergen Reactions  ? Nickel Itching and Rash  ? ?Prior to Admission medications   ?Medication Sig Start Date End Date Taking? Authorizing Provider   ?ARIPiprazole (ABILIFY) 5 MG tablet Take 1 tablet (5 mg total) by mouth daily. 02/05/21  Yes Thayer Headings, [place]  ?B Complex-C (B-COMPLEX WITH VITAMIN C) tablet Take 1 tablet by mouth daily.   Yes [provider]  ?BIOTIN PO Take 10,000 mcg by mouth daily.   Yes [provider]  ?buPROPion (WELLBUTRIN XL) 300 MG 24 hr tablet Take 1 tablet (300 mg total) by mouth daily. 02/05/21  Yes Thayer Headings, [place]  ?Cholecalciferol (VITAMIN D) 50 MCG (2000 UT) tablet Take 2,000 Units by mouth daily.   Yes [provider]  ?DULoxetine (CYMBALTA) 30 MG capsule TAKE ONE CAPSULE BY MOUTH DAILY TAKE WITH 60 MG 02/05/21  Yes Thayer Headings, PMHNP  ?DULoxetine (CYMBALTA) 60 MG capsule Take 1 capsule (60 mg total) by mouth every morning. Take with a 30 mg capsule to equal total dose of 90 mg 02/05/21 05/06/21 Yes Thayer Headings, PMHNP  ?Ginkgo Biloba 120 MG TABS Take 120 mg by mouth daily.   Yes [provider]  ?polyvinyl alcohol (LIQUIFILM TEARS) 1.4 % ophthalmic solution Place 1 drop into both eyes as needed for dry eyes.   Yes [provider]  ?Turmeric 500 MG CAPS Take 500 mg by mouth daily.   Yes [provider]  ? ?Social History  ? ?Socioeconomic History  ? Marital status: Married  ?  Spouse name: Not on file  ? Number of children: Not on file  ? Years of education: Not on file  ? Highest education level: Not on file  ?  Occupational History  ? Not on file  ?Tobacco Use  ? Smoking status: Never  ? Smokeless tobacco: Never  ?Vaping Use  ? Vaping Use: Never used  ?Substance and Sexual Activity  ? Alcohol use: Yes  ?  Alcohol/week: 1.0 standard drink  ?  Types: 1 Glasses of wine per week  ? Drug use: No  ? Sexual activity: Not Currently  ?Other Topics Concern  ? Not on file  ?Social History Narrative  ? She is married to another patient of [place]. They have been married for 20  years.  No children. 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  ? He never smoked, and drinks maybe 1-3 glass of wine a day.  ? He has recently started exercising with her husband who has completed cardiac rehabilitation and is in the maintenance program. They usually walks about 2-3 days a week for 30-45 minutes.  ? ?Social Determinants of Health  ? ?Financial Resource Strain: Low Risk   ? Difficulty of Paying Living Expenses: Not hard at all  ?Food Insecurity: Not on file  ?Transportation Needs: No Transportation Needs  ? Lack of Transportation (Medical): No  ? Lack of Transportation (Non-Medical): No  ?Physical Activity: Not on file  ?Stress: Not on file  ?Social Connections: Not on file  ? ?Family History  ?Problem Relation Age of Onset  ? Cancer Brother 66  ?     pancreatic cancer  ? Alcohol abuse Brother   ? Depression Brother   ? Asthma Paternal Grandmother   ? Heart disease Mother   ?     Mitral valve disease.  ? Dementia Mother   ? Lung disease Father   ?     Idiopathic pulmonary fibrosis  ? Heart disease Maternal Grandmother   ?     Died after complications of angioplasty.  ? Depression Sister   ? Alcohol abuse Sister   ? Alcohol abuse Brother   ? Depression Brother   ? Alcohol abuse Brother   ? Depression Cousin   ? Depression Other   ? ? ?ROS: Currently denies lightheadedness, dizziness, Fever, chills, CP, SOB.   ?No personal history of DVT, PE, MI, or CVA. ?No loose teeth or dentures ?All other systems have been reviewed and were otherwise currently negative with the exception of those mentioned in the HPI and as above. ? ?Objective: ?Vitals: Ht: 5' 0.5" Wt: 167 lbs Temp: 97.7 BP: 119/78 Pulse: 72 O2 98% on room air.   ?Physical Exam: ?General: Alert, NAD.   ?HEENT: EOMI, Good Neck Extension  ?Pulm: No increased work of breathing.  Clear B/L A/P w/o crackle or wheeze.  ?CV: RRR, No m/g/r appreciated  ?GI: soft, NT, ND ?Neuro: Neuro without gross focal deficit.  Sensation intact distally ?Skin: No lesions in the area of chief complaint ?MSK/Surgical Site:  right shoulder pain with range of motion.  Forward flexion/abduction approximately 90.  Internal rotation to L5.  External rotation to 30.  No AC pain.  No Biceps pain.  Intact Rotator cuff strength.  NVI distally. ? ?Imaging Review ?Plain radiographs  and CT scan demonstrate severe degenerative joint disease of the right shoulder with intact rotator cuff.  ? ?Assessment: ?djd right shoulder ?Principal Problem: ?  Primary osteoarthritis, right shoulder ? ? ?Plan: ?Plan for Procedure(s): ?TOTAL SHOULDER ARTHROPLASTY ?REVERSE SHOULDER ARTHROPLASTY ? ?The patient history, physical exam, clinical judgement of the provider and imaging are consistent with end stage  degenerative joint disease and total joint arthroplasty is deemed medically necessary. The treatment options including medical management, injection therapy, and arthroplasty were discussed at length. The risks and benefits of Procedure(s): ?TOTAL SHOULDER ARTHROPLASTY vs. REVERSE SHOULDER ARTHROPLASTY were presented and reviewed.  ?The risks of nonoperative treatment, versus surgical intervention including but not limited to continued pain, aseptic loosening, stiffness, dislocation/subluxation, infection, bleeding, nerve injury, blood clots, cardiopulmonary complications, morbidity, mortality, among others were discussed. The patient verbalizes understanding and wishes to proceed with the plan.  ?Patient is being admitted for surgery, OT, pain control, prophylactic antibiotics, VTE prophylaxis, progressive ambulation, ADL's and discharge planning.  ? ?Dental prophylaxis discussed and recommended for 2 years postoperatively. ? ?The patient does meet the criteria for TXA which will be used perioperatively.   ?The patient is planning to be discharged home care of her husband. ? ?Ventura Bruns, PA-C ?03/19/2021 ?4:42 PM ?  ?

## 2021-03-27 ENCOUNTER — Encounter (HOSPITAL_COMMUNITY)
Admission: RE | Admit: 2021-03-27 | Discharge: 2021-03-27 | Disposition: A | Discharge status: Home / Self Care | Payer: Medicare Other | Source: Ambulatory Visit | Attending: Orthopedic Surgery | Admitting: Orthopedic Surgery

## 2021-03-27 ENCOUNTER — Ambulatory Visit (INDEPENDENT_AMBULATORY_CARE_PROVIDER_SITE_OTHER): Payer: Medicare Other | Admitting: Physician Assistant

## 2021-03-27 ENCOUNTER — Other Ambulatory Visit: Payer: Self-pay

## 2021-03-27 ENCOUNTER — Encounter: Payer: Self-pay | Admitting: Physician Assistant

## 2021-03-27 VITALS — BP 120/60 | HR 87 | Ht 60.0 in | Wt 169.0 lb

## 2021-03-27 DIAGNOSIS — H6123 Impacted cerumen, bilateral: Secondary | ICD-10-CM | POA: Diagnosis not present

## 2021-03-27 DIAGNOSIS — Z01818 Encounter for other preprocedural examination: Secondary | ICD-10-CM

## 2021-03-27 DIAGNOSIS — Z20822 Contact with and (suspected) exposure to covid-19: Secondary | ICD-10-CM | POA: Insufficient documentation

## 2021-03-27 DIAGNOSIS — M19011 Primary osteoarthritis, right shoulder: Secondary | ICD-10-CM

## 2021-03-27 DIAGNOSIS — Z01812 Encounter for preprocedural laboratory examination: Secondary | ICD-10-CM | POA: Diagnosis not present

## 2021-03-27 LAB — SARS CORONAVIRUS 2 (TAT 6-24 HRS): SARS Coronavirus 2: NEGATIVE

## 2021-03-27 NOTE — Progress Notes (Signed)
Acute Office Visit  Subjective:    Patient ID: Megan Nunez, female    DOB: 06-03-1945, 76 y.o.   MRN: [id number]  Chief Complaint  Patient presents with   Ear Fullness    Pt said her left ear feels like its full of wax. The right one is not as bad. Pt denies pain in ears    HPI Patient is in today for a follow up appointment. States her left ear feels full and right ear feels partially full; has had ear wax flushed out in the past; did not use any OTC treatments at home; has not been swimming or had head under water; denies decreased hearing; denies current head congestion due to pine pollen.  Past Medical History:  Diagnosis Date   Anxiety    Arthritis    Depression    Family history of adverse reaction to anesthesia 2012   Mother has cardiac  arrest after surgery   Hepatitis    high antibodies for hepatitis   Obesity     Past Surgical History:  Procedure Laterality Date   LAPAROSCOPIC TUBAL LIGATION  1985   TOTAL KNEE ARTHROPLASTY Right 04/15/2020   Procedure: TOTAL KNEE ARTHROPLASTY;  Surgeon: Marchia Bond, MD;  Location: [facility];  Service: Orthopedics;  Laterality: Right;   TOTAL KNEE ARTHROPLASTY Left 08/05/2020   Procedure: TOTAL KNEE ARTHROPLASTY;  Surgeon: Marchia Bond, MD;  Location: [facility];  Service: Orthopedics;  Laterality: Left;   WISDOM TOOTH EXTRACTION     age 70    Family History  Problem Relation Age of Onset   Cancer Brother 61       pancreatic cancer   Alcohol abuse Brother    Depression Brother    Asthma Paternal Grandmother    Heart disease Mother        Mitral valve disease.   Dementia Mother    Lung disease Father        Idiopathic pulmonary fibrosis   Heart disease Maternal Grandmother        Died after complications of angioplasty.   Depression Sister    Alcohol abuse Sister    Alcohol abuse Brother    Depression Brother    Alcohol abuse Brother    Depression Cousin    Depression Other     Social History   Socioeconomic  History   Marital status: Married    Spouse name: Not on file   Number of children: Not on file   Years of education: Not on file   Highest education level: Not on file  Occupational History   Not on file  Tobacco Use   Smoking status: Never   Smokeless tobacco: Never  Vaping Use   Vaping Use: Never used  Substance and Sexual Activity   Alcohol use: Yes    Alcohol/week: 1.0 standard drink    Types: 1 Glasses of wine per week   Drug use: No   Sexual activity: Not Currently  Other Topics Concern   Not on file  Social History Narrative   She is married to another patient of [place]. They have been married for 20  years.  No children. 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   He never smoked, and drinks maybe 1-3 glass of wine a day.   He has recently started exercising with her husband who has completed cardiac rehabilitation and is in the maintenance program. They usually walks about 2-3 days a  week for 30-45 minutes.   Social Determinants of Health   Financial Resource Strain: Low Risk    Difficulty of Paying Living Expenses: Not hard at all  Food Insecurity: Not on file  Transportation Needs: No Transportation Needs   Lack of Transportation (Medical): No   Lack of Transportation (Non-Medical): No  Physical Activity: Not on file  Stress: Not on file  Social Connections: Not on file  Intimate Partner Violence: Not on file    Outpatient Medications Prior to Visit  Medication Sig Dispense Refill   ARIPiprazole (ABILIFY) 5 MG tablet Take 1 tablet (5 mg total) by mouth daily. 90 tablet 1   buPROPion (WELLBUTRIN XL) 300 MG 24 hr tablet Take 1 tablet (300 mg total) by mouth daily. 90 tablet 1   DULoxetine (CYMBALTA) 30 MG capsule TAKE ONE CAPSULE BY MOUTH DAILY TAKE WITH 60 MG 90 capsule 1   DULoxetine (CYMBALTA) 60 MG capsule Take 1 capsule (60 mg total) by mouth every morning. Take with a 30 mg capsule to equal total dose of 90 mg 90  capsule 1   B Complex-C (B-COMPLEX WITH VITAMIN C) tablet Take 1 tablet by mouth daily. (Patient not taking: Reported on 03/27/2021)     BIOTIN PO Take 10,000 mcg by mouth daily. (Patient not taking: Reported on 03/27/2021)     Cholecalciferol (VITAMIN D) 50 MCG (2000 UT) tablet Take 2,000 Units by mouth daily. (Patient not taking: Reported on 03/27/2021)     Ginkgo Biloba 120 MG TABS Take 120 mg by mouth daily. (Patient not taking: Reported on 03/27/2021)     polyvinyl alcohol (LIQUIFILM TEARS) 1.4 % ophthalmic solution Place 1 drop into both eyes as needed for dry eyes. (Patient not taking: Reported on 03/27/2021)     Turmeric 500 MG CAPS Take 500 mg by mouth daily. (Patient not taking: Reported on 03/27/2021)     No facility-administered medications prior to visit.    Allergies  Allergen Reactions   Nickel Itching and Rash    Review of Systems  Constitutional:  Negative for activity change and chills.  HENT:  Negative for congestion, ear pain, hearing loss and voice change.   Eyes:  Negative for pain and redness.  Respiratory:  Negative for cough and wheezing.   Cardiovascular:  Negative for chest pain.  Gastrointestinal:  Negative for constipation, diarrhea, nausea and vomiting.  Endocrine: Negative for polyuria.  Genitourinary:  Negative for frequency.  Skin:  Negative for color change and rash.  Allergic/Immunologic: Negative for immunocompromised state.  Neurological:  Negative for dizziness.  Psychiatric/Behavioral:  Negative for agitation.       Objective:    Physical Exam Vitals and nursing note reviewed.  Constitutional:      General: She is not in acute distress.    Appearance: Normal appearance. She is not ill-appearing.  HENT:     Head: Normocephalic and atraumatic.     Right Ear: External ear normal. There is impacted cerumen.     Left Ear: External ear normal. There is impacted cerumen.     Nose: No congestion.  Eyes:     Extraocular Movements: Extraocular  movements intact.     Conjunctiva/sclera: Conjunctivae normal.     Pupils: Pupils are equal, round, and reactive to light.  Cardiovascular:     Rate and Rhythm: Normal rate and regular rhythm.     Pulses: Normal pulses.     Heart sounds: Normal heart sounds.  Pulmonary:     Effort: Pulmonary effort is  normal.     Breath sounds: Normal breath sounds. No wheezing.  Abdominal:     General: Bowel sounds are normal.     Palpations: Abdomen is soft.  Musculoskeletal:     Cervical back: Normal range of motion and neck supple.     Right lower leg: No edema.     Left lower leg: No edema.  Skin:    General: Skin is warm and dry.     Findings: No bruising.  Neurological:     General: No focal deficit present.     Mental Status: She is alert and oriented to person, place, and time.  Psychiatric:        Mood and Affect: Mood normal.        Behavior: Behavior normal.        Thought Content: Thought content normal.    BP 120/60    Pulse 87    Ht 5' (1.524 m)    Wt 169 lb (76.7 kg)    SpO2 99%    BMI 33.01 kg/m   Wt Readings from Last 3 Encounters:  03/27/21 169 lb (76.7 kg)  03/19/21 165 lb (74.8 kg)  02/13/21 167 lb 3.2 oz (75.8 kg)    Health Maintenance Due  Topic Date Due   Fecal DNA (Cologuard)  02/04/2021    There are no preventive care reminders to display for this patient.   No results found for: TSH Lab Results  Component Value Date   WBC 7.6 03/19/2021   HGB 14.8 03/19/2021   HCT 45.2 03/19/2021   MCV 90.4 03/19/2021   PLT 211 03/19/2021   Lab Results  Component Value Date   NA 137 03/19/2021   K 4.0 03/19/2021   CO2 28 03/19/2021   GLUCOSE 96 03/19/2021   BUN 12 03/19/2021   CREATININE 0.65 03/19/2021   BILITOT 0.6 03/19/2021   ALKPHOS 75 03/19/2021   AST 19 03/19/2021   ALT 15 03/19/2021   PROT 7.1 03/19/2021   ALBUMIN 4.1 03/19/2021   CALCIUM 9.2 03/19/2021   ANIONGAP 6 03/19/2021   EGFR 94 04/10/2020   Lab Results  Component Value Date   CHOL  172 04/10/2020   Lab Results  Component Value Date   HDL 57 04/10/2020   Lab Results  Component Value Date   LDLCALC 102 (H) 04/10/2020   Lab Results  Component Value Date   TRIG 70 04/10/2020   Lab Results  Component Value Date   CHOLHDL 3.0 04/10/2020   Lab Results  Component Value Date   HGBA1C 5.4 02/13/2021       Assessment & Plan:   Problem List Items Addressed This Visit   None  Cerumen removed by ear lavage, bilateral ears by CMA.  No orders of the defined types were placed in this encounter.  Already has a follow appt with Dr. Redmond School for 04/15/2021, but has right shoulder surgery scheduled for 03/31/2021 and may need to change that appointment.  Irene Pap, PA-C

## 2021-03-31 ENCOUNTER — Other Ambulatory Visit: Payer: Self-pay

## 2021-03-31 ENCOUNTER — Ambulatory Visit (HOSPITAL_COMMUNITY)
Admission: RE | Admit: 2021-03-31 | Discharge: 2021-04-01 | Disposition: A | Discharge status: Home / Self Care | Payer: Medicare Other | Source: Ambulatory Visit | Attending: Orthopedic Surgery | Admitting: Orthopedic Surgery

## 2021-03-31 ENCOUNTER — Encounter (HOSPITAL_COMMUNITY): Payer: Self-pay | Admitting: Orthopedic Surgery

## 2021-03-31 ENCOUNTER — Ambulatory Visit (HOSPITAL_COMMUNITY): Payer: Medicare Other

## 2021-03-31 ENCOUNTER — Ambulatory Visit (HOSPITAL_COMMUNITY): Payer: Medicare Other | Admitting: Anesthesiology

## 2021-03-31 ENCOUNTER — Encounter (HOSPITAL_COMMUNITY)
Admission: RE | Disposition: A | Discharge status: Home / Self Care | Payer: Self-pay | Source: Ambulatory Visit | Attending: Orthopedic Surgery

## 2021-03-31 ENCOUNTER — Ambulatory Visit (HOSPITAL_BASED_OUTPATIENT_CLINIC_OR_DEPARTMENT_OTHER): Payer: Medicare Other | Admitting: Anesthesiology

## 2021-03-31 DIAGNOSIS — M6281 Muscle weakness (generalized): Secondary | ICD-10-CM | POA: Diagnosis not present

## 2021-03-31 DIAGNOSIS — F419 Anxiety disorder, unspecified: Secondary | ICD-10-CM | POA: Diagnosis not present

## 2021-03-31 DIAGNOSIS — E669 Obesity, unspecified: Secondary | ICD-10-CM | POA: Insufficient documentation

## 2021-03-31 DIAGNOSIS — K759 Inflammatory liver disease, unspecified: Secondary | ICD-10-CM | POA: Insufficient documentation

## 2021-03-31 DIAGNOSIS — Z96611 Presence of right artificial shoulder joint: Secondary | ICD-10-CM

## 2021-03-31 DIAGNOSIS — G8918 Other acute postprocedural pain: Secondary | ICD-10-CM | POA: Diagnosis not present

## 2021-03-31 DIAGNOSIS — F32A Depression, unspecified: Secondary | ICD-10-CM | POA: Diagnosis not present

## 2021-03-31 DIAGNOSIS — Z6833 Body mass index (BMI) 33.0-33.9, adult: Secondary | ICD-10-CM | POA: Insufficient documentation

## 2021-03-31 DIAGNOSIS — M19011 Primary osteoarthritis, right shoulder: Secondary | ICD-10-CM | POA: Insufficient documentation

## 2021-03-31 DIAGNOSIS — Z01818 Encounter for other preprocedural examination: Secondary | ICD-10-CM

## 2021-03-31 HISTORY — PX: TOTAL SHOULDER ARTHROPLASTY: SHX126

## 2021-03-31 LAB — TYPE AND SCREEN
ABO/RH(D): O POS
Antibody Screen: NEGATIVE

## 2021-03-31 LAB — ABO/RH: ABO/RH(D): O POS

## 2021-03-31 SURGERY — ARTHROPLASTY, SHOULDER, TOTAL
Anesthesia: General | Site: Shoulder | Laterality: Right

## 2021-03-31 MED ORDER — DULOXETINE HCL 60 MG PO CPEP
90.0000 mg | ORAL_CAPSULE | Freq: Every day | ORAL | Status: DC
  Administered 2021-04-01: 90 mg via ORAL
  Filled 2021-03-31: qty 1

## 2021-03-31 MED ORDER — ONDANSETRON HCL 4 MG PO TABS: 4.0000 mg | ORAL_TABLET | Freq: Four times a day (QID) | ORAL | Status: DC | PRN

## 2021-03-31 MED ORDER — FENTANYL CITRATE PF 50 MCG/ML IJ SOSY
100.0000 ug | PREFILLED_SYRINGE | INTRAMUSCULAR | Status: DC
  Administered 2021-03-31: 50 ug via INTRAVENOUS

## 2021-03-31 MED ORDER — ONDANSETRON HCL 4 MG PO TABS: 4.0000 mg | ORAL_TABLET | Freq: Three times a day (TID) | ORAL | 0 refills | Status: DC | PRN

## 2021-03-31 MED ORDER — LIDOCAINE HCL (PF) 2 % IJ SOLN
INTRAMUSCULAR | Status: AC
  Filled 2021-03-31: qty 10

## 2021-03-31 MED ORDER — LIDOCAINE HCL (CARDIAC) PF 100 MG/5ML IV SOSY
PREFILLED_SYRINGE | INTRAVENOUS | Status: DC | PRN
Start: 2021-03-31 — End: 2021-03-31
  Administered 2021-03-31: 80 mg via INTRAVENOUS

## 2021-03-31 MED ORDER — POLYETHYLENE GLYCOL 3350 17 G PO PACK: 17.0000 g | PACK | Freq: Every day | ORAL | Status: DC | PRN

## 2021-03-31 MED ORDER — ACETAMINOPHEN 325 MG PO TABS: 325.0000 mg | ORAL_TABLET | Freq: Four times a day (QID) | ORAL | Status: DC | PRN

## 2021-03-31 MED ORDER — BUPIVACAINE HCL 0.25 % IJ SOLN: INTRAMUSCULAR | Status: DC | PRN

## 2021-03-31 MED ORDER — EPHEDRINE SULFATE (PRESSORS) 50 MG/ML IJ SOLN
INTRAMUSCULAR | Status: DC | PRN
  Administered 2021-03-31 (×3): 10 mg via INTRAVENOUS
  Administered 2021-03-31: 5 mg via INTRAVENOUS

## 2021-03-31 MED ORDER — DULOXETINE HCL 60 MG PO CPEP: 60.0000 mg | ORAL_CAPSULE | Freq: Every morning | ORAL | Status: DC

## 2021-03-31 MED ORDER — EPHEDRINE 5 MG/ML INJ
INTRAVENOUS | Status: AC
  Filled 2021-03-31: qty 5

## 2021-03-31 MED ORDER — MIDAZOLAM HCL 2 MG/2ML IJ SOLN
INTRAMUSCULAR | Status: AC
  Filled 2021-03-31: qty 2

## 2021-03-31 MED ORDER — ALUM & MAG HYDROXIDE-SIMETH 200-200-20 MG/5ML PO SUSP: 30.0000 mL | ORAL | Status: DC | PRN

## 2021-03-31 MED ORDER — FENTANYL CITRATE PF 50 MCG/ML IJ SOSY
PREFILLED_SYRINGE | INTRAMUSCULAR | Status: AC
  Filled 2021-03-31: qty 2

## 2021-03-31 MED ORDER — OXYCODONE HCL 5 MG PO TABS: 5.0000 mg | ORAL_TABLET | ORAL | 0 refills | Status: DC | PRN

## 2021-03-31 MED ORDER — METHOCARBAMOL 500 MG PO TABS: 500.0000 mg | ORAL_TABLET | Freq: Four times a day (QID) | ORAL | Status: DC | PRN

## 2021-03-31 MED ORDER — BUPIVACAINE HCL 0.25 % IJ SOLN
INTRAMUSCULAR | Status: AC
  Filled 2021-03-31: qty 1

## 2021-03-31 MED ORDER — PHENOL 1.4 % MT LIQD: 1.0000 | OROMUCOSAL | Status: DC | PRN

## 2021-03-31 MED ORDER — ROCURONIUM BROMIDE 100 MG/10ML IV SOLN
INTRAVENOUS | Status: DC | PRN
  Administered 2021-03-31: 10 mg via INTRAVENOUS
  Administered 2021-03-31: 50 mg via INTRAVENOUS
  Administered 2021-03-31: 10 mg via INTRAVENOUS

## 2021-03-31 MED ORDER — CEFAZOLIN SODIUM-DEXTROSE 2-4 GM/100ML-% IV SOLN
2.0000 g | Freq: Four times a day (QID) | INTRAVENOUS | Status: AC
  Administered 2021-03-31 – 2021-04-01 (×3): 2 g via INTRAVENOUS
  Filled 2021-03-31 (×3): qty 100

## 2021-03-31 MED ORDER — BUPIVACAINE LIPOSOME 1.3 % IJ SUSP
INTRAMUSCULAR | Status: DC | PRN
  Administered 2021-03-31: 10 mL via PERINEURAL

## 2021-03-31 MED ORDER — ACETAMINOPHEN 500 MG PO TABS
1000.0000 mg | ORAL_TABLET | Freq: Once | ORAL | Status: AC
  Administered 2021-03-31: 1000 mg via ORAL
  Filled 2021-03-31: qty 2

## 2021-03-31 MED ORDER — ONDANSETRON HCL 4 MG/2ML IJ SOLN: 4.0000 mg | Freq: Four times a day (QID) | INTRAMUSCULAR | Status: DC | PRN

## 2021-03-31 MED ORDER — ORAL CARE MOUTH RINSE: 15.0000 mL | Freq: Once | OROMUCOSAL | Status: AC

## 2021-03-31 MED ORDER — METOCLOPRAMIDE HCL 5 MG PO TABS: 5.0000 mg | ORAL_TABLET | Freq: Three times a day (TID) | ORAL | Status: DC | PRN

## 2021-03-31 MED ORDER — DULOXETINE HCL 30 MG PO CPEP: 30.0000 mg | ORAL_CAPSULE | Freq: Every day | ORAL | Status: DC

## 2021-03-31 MED ORDER — PROPOFOL 10 MG/ML IV BOLUS
INTRAVENOUS | Status: DC | PRN
  Administered 2021-03-31: 80 mg via INTRAVENOUS

## 2021-03-31 MED ORDER — MENTHOL 3 MG MT LOZG: 1.0000 | LOZENGE | OROMUCOSAL | Status: DC | PRN

## 2021-03-31 MED ORDER — SENNA-DOCUSATE SODIUM 8.6-50 MG PO TABS: 2.0000 | ORAL_TABLET | Freq: Every day | ORAL | 1 refills | Status: DC

## 2021-03-31 MED ORDER — CHLORHEXIDINE GLUCONATE 0.12 % MT SOLN
15.0000 mL | Freq: Once | OROMUCOSAL | Status: AC
  Administered 2021-03-31: 15 mL via OROMUCOSAL

## 2021-03-31 MED ORDER — ONDANSETRON HCL 4 MG/2ML IJ SOLN
INTRAMUSCULAR | Status: DC | PRN
  Administered 2021-03-31: 4 mg via INTRAVENOUS

## 2021-03-31 MED ORDER — ONDANSETRON HCL 4 MG/2ML IJ SOLN
INTRAMUSCULAR | Status: AC
  Filled 2021-03-31: qty 6

## 2021-03-31 MED ORDER — TRANEXAMIC ACID-NACL 1000-0.7 MG/100ML-% IV SOLN
1000.0000 mg | Freq: Once | INTRAVENOUS | Status: AC
  Administered 2021-03-31: 1000 mg via INTRAVENOUS
  Filled 2021-03-31: qty 100

## 2021-03-31 MED ORDER — BUPROPION HCL ER (XL) 300 MG PO TB24
300.0000 mg | ORAL_TABLET | Freq: Every day | ORAL | Status: DC
  Administered 2021-04-01: 300 mg via ORAL
  Filled 2021-03-31: qty 1

## 2021-03-31 MED ORDER — PROPOFOL 1000 MG/100ML IV EMUL
INTRAVENOUS | Status: AC
  Filled 2021-03-31: qty 100

## 2021-03-31 MED ORDER — METOCLOPRAMIDE HCL 5 MG/ML IJ SOLN: 5.0000 mg | Freq: Three times a day (TID) | INTRAMUSCULAR | Status: DC | PRN

## 2021-03-31 MED ORDER — BISACODYL 10 MG RE SUPP: 10.0000 mg | Freq: Every day | RECTAL | Status: DC | PRN

## 2021-03-31 MED ORDER — FENTANYL CITRATE PF 50 MCG/ML IJ SOSY: 25.0000 ug | PREFILLED_SYRINGE | INTRAMUSCULAR | Status: DC | PRN

## 2021-03-31 MED ORDER — PROPOFOL 10 MG/ML IV BOLUS
INTRAVENOUS | Status: AC
  Filled 2021-03-31: qty 20

## 2021-03-31 MED ORDER — ROCURONIUM BROMIDE 10 MG/ML (PF) SYRINGE
PREFILLED_SYRINGE | INTRAVENOUS | Status: AC
  Filled 2021-03-31: qty 10

## 2021-03-31 MED ORDER — FLEET ENEMA 7-19 GM/118ML RE ENEM
1.0000 | ENEMA | Freq: Once | RECTAL | Status: DC | PRN
Start: 2021-03-31 — End: 2021-04-01

## 2021-03-31 MED ORDER — SODIUM CHLORIDE 0.9 % IR SOLN
Status: DC | PRN
  Administered 2021-03-31: 1000 mL

## 2021-03-31 MED ORDER — TRANEXAMIC ACID-NACL 1000-0.7 MG/100ML-% IV SOLN
1000.0000 mg | INTRAVENOUS | Status: AC
  Administered 2021-03-31: 1000 mg via INTRAVENOUS
  Filled 2021-03-31: qty 100

## 2021-03-31 MED ORDER — ONDANSETRON HCL 4 MG/2ML IJ SOLN: 4.0000 mg | Freq: Once | INTRAMUSCULAR | Status: DC | PRN

## 2021-03-31 MED ORDER — OXYCODONE HCL 5 MG/5ML PO SOLN: 5.0000 mg | Freq: Once | ORAL | Status: DC | PRN

## 2021-03-31 MED ORDER — POTASSIUM CHLORIDE IN NACL 20-0.45 MEQ/L-% IV SOLN
INTRAVENOUS | Status: DC
  Filled 2021-03-31: qty 1000

## 2021-03-31 MED ORDER — CEFAZOLIN SODIUM-DEXTROSE 2-4 GM/100ML-% IV SOLN
2.0000 g | INTRAVENOUS | Status: AC
  Administered 2021-03-31: 2 g via INTRAVENOUS
  Filled 2021-03-31: qty 100

## 2021-03-31 MED ORDER — DEXAMETHASONE SODIUM PHOSPHATE 10 MG/ML IJ SOLN
INTRAMUSCULAR | Status: AC
  Filled 2021-03-31: qty 3

## 2021-03-31 MED ORDER — PHENYLEPHRINE HCL (PRESSORS) 10 MG/ML IV SOLN
INTRAVENOUS | Status: AC
  Filled 2021-03-31: qty 1

## 2021-03-31 MED ORDER — FENTANYL CITRATE (PF) 100 MCG/2ML IJ SOLN
INTRAMUSCULAR | Status: AC
  Filled 2021-03-31: qty 2

## 2021-03-31 MED ORDER — PHENYLEPHRINE HCL-NACL 20-0.9 MG/250ML-% IV SOLN
INTRAVENOUS | Status: DC | PRN
  Administered 2021-03-31: 40 ug/min via INTRAVENOUS

## 2021-03-31 MED ORDER — DEXAMETHASONE SODIUM PHOSPHATE 10 MG/ML IJ SOLN
INTRAMUSCULAR | Status: DC | PRN
  Administered 2021-03-31: 10 mg via INTRAVENOUS

## 2021-03-31 MED ORDER — POVIDONE-IODINE 10 % EX SWAB: 2.0000 "application " | Freq: Once | CUTANEOUS | Status: DC

## 2021-03-31 MED ORDER — DOCUSATE SODIUM 100 MG PO CAPS
100.0000 mg | ORAL_CAPSULE | Freq: Two times a day (BID) | ORAL | Status: DC
  Administered 2021-03-31 – 2021-04-01 (×2): 100 mg via ORAL
  Filled 2021-03-31 (×2): qty 1

## 2021-03-31 MED ORDER — METHOCARBAMOL 500 MG IVPB - SIMPLE MED
500.0000 mg | Freq: Four times a day (QID) | INTRAVENOUS | Status: DC | PRN
  Filled 2021-03-31: qty 50

## 2021-03-31 MED ORDER — OXYCODONE HCL 5 MG PO TABS: 5.0000 mg | ORAL_TABLET | ORAL | Status: DC | PRN

## 2021-03-31 MED ORDER — ARIPIPRAZOLE 10 MG PO TABS
5.0000 mg | ORAL_TABLET | Freq: Every day | ORAL | Status: DC
  Administered 2021-04-01: 5 mg via ORAL
  Filled 2021-03-31: qty 1

## 2021-03-31 MED ORDER — OXYCODONE HCL 5 MG PO TABS: 10.0000 mg | ORAL_TABLET | ORAL | Status: DC | PRN

## 2021-03-31 MED ORDER — OXYCODONE HCL 5 MG PO TABS: 5.0000 mg | ORAL_TABLET | Freq: Once | ORAL | Status: DC | PRN

## 2021-03-31 MED ORDER — FENTANYL CITRATE (PF) 100 MCG/2ML IJ SOLN
INTRAMUSCULAR | Status: DC | PRN
  Administered 2021-03-31: 50 ug via INTRAVENOUS

## 2021-03-31 MED ORDER — BUPIVACAINE HCL (PF) 0.5 % IJ SOLN
INTRAMUSCULAR | Status: DC | PRN
  Administered 2021-03-31: 15 mL via PERINEURAL

## 2021-03-31 MED ORDER — MIDAZOLAM HCL 2 MG/2ML IJ SOLN
2.0000 mg | INTRAMUSCULAR | Status: DC
  Administered 2021-03-31: 1 mg via INTRAVENOUS

## 2021-03-31 MED ORDER — ACETAMINOPHEN 500 MG PO TABS
1000.0000 mg | ORAL_TABLET | Freq: Four times a day (QID) | ORAL | Status: DC
  Administered 2021-03-31 – 2021-04-01 (×2): 1000 mg via ORAL
  Filled 2021-03-31 (×2): qty 2

## 2021-03-31 MED ORDER — SUGAMMADEX SODIUM 200 MG/2ML IV SOLN
INTRAVENOUS | Status: DC | PRN
  Administered 2021-03-31: 200 mg via INTRAVENOUS

## 2021-03-31 MED ORDER — DIPHENHYDRAMINE HCL 12.5 MG/5ML PO ELIX: 12.5000 mg | ORAL_SOLUTION | ORAL | Status: DC | PRN

## 2021-03-31 MED ORDER — LACTATED RINGERS IV SOLN
INTRAVENOUS | Status: DC
Start: 2021-03-31 — End: 2021-03-31

## 2021-03-31 MED ORDER — HYDROMORPHONE HCL 1 MG/ML IJ SOLN: 0.5000 mg | INTRAMUSCULAR | Status: DC | PRN

## 2021-03-31 SURGICAL SUPPLY — 77 items
ADPR HD STD TPR HUM TI RVRS (Orthopedic Implant) ×1 IMPLANT
AID PSTN UNV HD RSTRNT DISP (MISCELLANEOUS) ×1
BAG COUNTER SPONGE SURGICOUNT (BAG) ×1 IMPLANT
BAG SPEC THK2 15X12 ZIP CLS (MISCELLANEOUS) ×1
BAG SPNG CNTER NS LX DISP (BAG) ×1
BAG ZIPLOCK 12X15 (MISCELLANEOUS) ×3 IMPLANT
BIT DRILL QUICK REL 1/8 2PK SL (DRILL) IMPLANT
BLADE SAW SAG 73X25 THK (BLADE) ×1
BLADE SAW SGTL 18X1.27X75 (BLADE) ×3 IMPLANT
BLADE SAW SGTL 73X25 THK (BLADE) ×2 IMPLANT
BOOTIES KNEE HIGH SLOAN (MISCELLANEOUS) ×3 IMPLANT
BOWL SMART MIX CTS (DISPOSABLE) IMPLANT
CEMENT BONE R 1X40 (Cement) ×3 IMPLANT
CLSR STERI-STRIP ANTIMIC 1/2X4 (GAUZE/BANDAGES/DRESSINGS) ×3 IMPLANT
COOLER ICEMAN CLASSIC (MISCELLANEOUS) ×3 IMPLANT
COVER BACK TABLE 60X90IN (DRAPES) ×3 IMPLANT
COVER MAYO STAND STRL (DRAPES) ×3 IMPLANT
COVER SURGICAL LIGHT HANDLE (MISCELLANEOUS) ×3 IMPLANT
DRAPE ORTHO SPLIT 77X108 STRL (DRAPES)
DRAPE POUCH INSTRU U-SHP 10X18 (DRAPES) ×3 IMPLANT
DRAPE SHEET LG 3/4 BI-LAMINATE (DRAPES) ×6 IMPLANT
DRAPE SURG 17X11 SM STRL (DRAPES) ×3 IMPLANT
DRAPE SURG ORHT 6 SPLT 77X108 (DRAPES) IMPLANT
DRAPE U-SHAPE 47X51 STRL (DRAPES) ×3 IMPLANT
DRILL QUICK RELEASE 1/8 INCH (DRILL) ×2
DRSG MEPILEX BORDER 4X8 (GAUZE/BANDAGES/DRESSINGS) ×3 IMPLANT
DURAPREP 26ML APPLICATOR (WOUND CARE) ×7 IMPLANT
ELECT REM PT RETURN 15FT ADLT (MISCELLANEOUS) ×3 IMPLANT
GLENOID MOD PE 3 PEG SZ 3 (Shoulder) ×1 IMPLANT
GLENOID MOD POST PPS (Post) ×1 IMPLANT
GLOVE SRG 8 PF TXTR STRL LF DI (GLOVE) ×2 IMPLANT
GLOVE SURG ENC MOIS LTX SZ6.5 (GLOVE) ×3 IMPLANT
GLOVE SURG ENC MOIS LTX SZ7 (GLOVE) ×3 IMPLANT
GLOVE SURG ENC MOIS LTX SZ7.5 (GLOVE) ×3 IMPLANT
GLOVE SURG ORTHO LTX SZ7.5 (GLOVE) ×3 IMPLANT
GLOVE SURG UNDER POLY LF SZ7 (GLOVE) ×3 IMPLANT
GLOVE SURG UNDER POLY LF SZ8 (GLOVE) ×2
GOWN STRL REUS W/ TWL LRG LVL3 (GOWN DISPOSABLE) ×4 IMPLANT
GOWN STRL REUS W/TWL 2XL LVL3 (GOWN DISPOSABLE) ×3 IMPLANT
GOWN STRL REUS W/TWL LRG LVL3 (GOWN DISPOSABLE) ×4
HANDPIECE INTERPULSE COAX TIP (DISPOSABLE) ×2
HEAD HUM SHLD 18X46X42 (Shoulder) ×1 IMPLANT
HEAD HUMERAL COMP STD (Orthopedic Implant) IMPLANT
HOOD PEEL AWAY FLYTE STAYCOOL (MISCELLANEOUS) ×9 IMPLANT
HUMERAL HEAD COMP STD (Orthopedic Implant) ×2 IMPLANT
KIT BASIN OR (CUSTOM PROCEDURE TRAY) ×3 IMPLANT
KIT TURNOVER KIT A (KITS) ×1 IMPLANT
NDL MA TROC 1/2 (NEEDLE) IMPLANT
NEEDLE MA TROC 1/2 (NEEDLE) ×2 IMPLANT
NS IRRIG 1000ML POUR BTL (IV SOLUTION) ×3 IMPLANT
PACK SHOULDER (CUSTOM PROCEDURE TRAY) ×3 IMPLANT
PAD COLD SHLDR WRAP-ON (PAD) ×3 IMPLANT
PIN THREADED REVERSE (PIN) ×1 IMPLANT
PROTECTOR NERVE ULNAR (MISCELLANEOUS) ×3 IMPLANT
RESTRAINT HEAD UNIVERSAL NS (MISCELLANEOUS) ×3 IMPLANT
SET HNDPC FAN SPRY TIP SCT (DISPOSABLE) ×2 IMPLANT
SLING ARM IMMOBILIZER LRG (SOFTGOODS) ×3 IMPLANT
SLING ARM IMMOBILIZER MED (SOFTGOODS) ×1 IMPLANT
SMARTMIX MINI TOWER (MISCELLANEOUS) ×2
SPIKE FLUID TRANSFER (MISCELLANEOUS) IMPLANT
SPONGE T-LAP 18X18 ~~LOC~~+RFID (SPONGE) ×3 IMPLANT
SPONGE T-LAP 4X18 ~~LOC~~+RFID (SPONGE) ×6 IMPLANT
STEM HUMERAL STRL 10MMX55MM (Stem) ×1 IMPLANT
SUCTION FRAZIER HANDLE 12FR (TUBING) ×2
SUCTION TUBE FRAZIER 12FR DISP (TUBING) ×2 IMPLANT
SUPPORT WRAP ARM LG (MISCELLANEOUS) ×3 IMPLANT
SUT MAXBRAID #2 CVD NDL (SUTURE) ×1 IMPLANT
SUT MAXBRAID #5 CCS-NDL 2PK (SUTURE) ×2 IMPLANT
SUT VIC AB 1 CT1 36 (SUTURE) ×3 IMPLANT
SUT VIC AB 2-0 CT1 27 (SUTURE) ×2
SUT VIC AB 2-0 CT1 TAPERPNT 27 (SUTURE) ×2 IMPLANT
SUT VIC AB 3-0 SH 8-18 (SUTURE) ×3 IMPLANT
TOWEL OR 17X26 10 PK STRL BLUE (TOWEL DISPOSABLE) ×3 IMPLANT
TOWEL OR NON WOVEN STRL DISP B (DISPOSABLE) ×3 IMPLANT
TOWER CARTRIDGE SMART MIX (DISPOSABLE) ×1 IMPLANT
TOWER SMARTMIX MINI (MISCELLANEOUS) ×2 IMPLANT
WATER STERILE IRR 1000ML POUR (IV SOLUTION) ×6 IMPLANT

## 2021-03-31 NOTE — Op Note (Signed)
03/31/2021 ? ?1:34 PM ? ?PATIENT:  Megan Nunez   ? ?PRE-OPERATIVE DIAGNOSIS: Right shoulder primary localized osteoarthritis ? ?POST-OPERATIVE DIAGNOSIS:  Same ? ?PROCEDURE:  Total Shoulder Arthroplasty ? ?SURGEON:  Eulas Post, MD ? ?PHYSICIAN ASSISTANT: Janine Ores, PA-C, present and scrubbed throughout the case, critical for completion in a timely fashion, and for retraction, instrumentation, and closure. ? ?ANESTHESIA:   General with an interscalene block ? ?ESTIMATED BLOOD LOSS: 150 mL ? ?UNIQUE ASPECTS OF THE CASE: There was a large loose body just inferior to the supraspinatus, which I was able to remove.  The rotator cuff was intact.  The head was extremely flattened.  The glenoid was eburnated.  The subscapularis was somewhat tight at the completion of the case, I suspect because I restore the height on the head, as well as the height on the glenoid, which may have lateralized her central rotation somewhat.  There was advanced wear on the glenoid, and based on preoperative templating it appeared that the inline glenoid prosthesis was most appropriate, and reduced risk for perforation.  Additionally, the 4 peg size 3 was not available, and the size 4 felt like it might end up being slightly large, so I ended up using a size 3 with inline pegs, which were all well contained within the vault.  Getting this to sit down was a little bit challenging, I think my Fukuda retractor was blocking my exact position, but ultimately it sat down nicely.  The size 12 broach did not sit fully down, the size 11 was fairly tight, and I ended up using a 10, which had excellent rotational control and metaphyseal fit. ? ?PREOPERATIVE INDICATIONS:  Megan Nunez is a  76 y.o. female who failed conservative measures and elected for surgical management.   ? ?The risks benefits and alternatives were discussed with the patient preoperatively including but not limited to the risks of infection, bleeding, nerve injury,  cardiopulmonary complications, the need for revision surgery, dislocation, loosening, incomplete relief of pain, among others, and the patient was willing to proceed. ? ? ?OPERATIVE IMPLANTS: Biomet size 10 micro press-fit humeral stem, size 42+18 versa-dial humeral head with the implant appropriate for cobalt alloy sensitive patient's, set in the B position with increased coverage posteriorly, with a size 3 2-peg cemented glenoid polyethylene with a central regenerex noncemented post.  ? ?OPERATIVE FINDINGS: Advanced glenohumeral osteoarthritis involving the glenoid and the humeral head with substantial osteophyte formation inferiorly. ?  ?OPERATIVE PROCEDURE: The patient was brought to the operating room and placed in the supine position. General anesthesia was administered. IV antibiotics were given.  The upper extremity was prepped and draped in usual sterile fashion. The patient was in a beachchair position with all bony prominences padded.  ? ?Time out was performed and a deltopectoral approach was carried out. The biceps tendon was tenodesed to the pectoralis tendon. The subscapularis was released, tagging it with a #2 max braid, leaving a cuff of tendon for repair.  ? ?The inferior osteophyte was removed, and release of the capsule off of the humeral side was completed. The head was dislocated, and I reamed sequentially. I placed the humeral cutting guide at 30? of retroversion, and then pinned this into place, and made my humeral neck cut. This was at the appropriate level.  ? ?I then placed deep retractors and exposed the glenoid. I excised the labrum circumferentially, taking care to protect the axillary nerve inferiorly.  ? ?I then placed a guidewire into the  center position, controlling appropriate version and inclination. I then reamed over the guidewire with the small reamer, and was satisfied with the preparation. I preserved the subchondral bone in order to maximize the strength and minimize the  risk for subsequent subsidence.  ? ?I then drilled the central hole for the regenerex peg, and then placed the guide, and then drilled the 3 peripheral peg holes. I had excellent bony circumferential contact.  ? ?I then cleaned the glenoid, irrigated it copiously, and then dried it and cemented the prosthesis into place. Excellent seating was achieved. I had full exposure. The cement cured while I turned my attention to the humeral side.  ? ?I sequentially broached, up to the selected size, with the broach set at 30? of retroversion. I placed 3 #5 max braid through the bone for subsequent repair.  I then placed the real stem. I trialed with multiple heads, and the above-named component was selected. Increased posterior coverage improved the coverage. The soft tissue tension was appropriate.  ? ?I then impacted the real humeral head into place, reduced the head, and irrigated copiously. Excellent stability and range of motion was achieved. I repaired the subscapularis with a total of 2 #2 max braid and 3 #5 max braid; one for the interval, one for the corner, and then the remaining three from the lesser tuberosity which had already been passed.  Excellent repair achieved and I irrigated copiously once more. The subcutaneous tissue was closed with Vicryl including the deltopectoral fascia.  ? ?The skin was closed with Steri-Strips and sterile gauze was applied. She had a preoperative nerve block. She tolerated the procedure well and there were no complications.  ? ?

## 2021-03-31 NOTE — Anesthesia Procedure Notes (Signed)
Anesthesia Regional Block: Interscalene brachial plexus block  ? ?Pre-Anesthetic Checklist: , timeout performed,  Correct Patient, Correct Site, Correct Laterality,  Correct Procedure, Correct Position, site marked,  Risks and benefits discussed,  Surgical consent,  Pre-op evaluation,  At surgeon's request and post-op pain management ? ?Laterality: Right ? ?Prep: chloraprep     ?  ?Needles:  ?Injection technique: Single-shot ? ?Needle Type: Echogenic Stimulator Needle   ? ? ?Needle Length: 10cm  ?Needle Gauge: 21  ? ?Needle insertion depth: 6 cm ? ? ?Additional Needles: ? ? ?Procedures:,,,, ultrasound used (permanent image in chart),,   ?Motor weakness within 5 minutes.  ?Narrative:  ?Start time: 03/31/2021 9:20 AM ?End time: 03/31/2021 9:25 AM ?Injection made incrementally with aspirations every 5 mL. ? ?Performed by: Personally  ?Anesthesiologist: Mal Amabile, MD ? ?Additional Notes: ?Timeout performed. Patient sedated. Relevant anatomy ID'd using [place]. Incremental 2-54ml injection of LA with frequent aspiration. Patient tolerated procedure well. ? ? ? ? ?Right Interscalene Block ? ?

## 2021-03-31 NOTE — Transfer of Care (Signed)
Immediate Anesthesia Transfer of Care Note ? ?Patient: Megan Nunez ? ?Procedure(s) Performed: Procedure(s): ?TOTAL SHOULDER ARTHROPLASTY (Right) ? ?Patient Location: PACU ? ?Anesthesia Type:GA combined with regional for post-op pain ? ?Level of Consciousness:  sedated, patient cooperative and responds to stimulation ? ?Airway & Oxygen Therapy:Patient Spontanous Breathing and Patient connected to face mask oxgen ? ?Post-op Assessment:  Report given to PACU RN and Post -op Vital signs reviewed and stable ? ?Post vital signs:  Reviewed and stable ? ?Last Vitals:  ?Vitals:  ? 03/31/21 1007 03/31/21 1008  ?BP:    ?Pulse: 69 71  ?Resp: 17 19  ?Temp:    ?SpO2: 97% 98%  ? ? ?Complications: No apparent anesthesia complications ? ?

## 2021-03-31 NOTE — Anesthesia Postprocedure Evaluation (Signed)
Anesthesia Post Note ? ?Patient: CHERLYN SYRING ? ?Procedure(s) Performed: TOTAL SHOULDER ARTHROPLASTY (Right: Shoulder) ? ?  ? ?Patient location during evaluation: PACU ?Anesthesia Type: General ?Level of consciousness: awake and alert and oriented ?Pain management: pain level controlled ?Vital Signs Assessment: post-procedure vital signs reviewed and stable ?Respiratory status: spontaneous breathing, nonlabored ventilation and respiratory function stable ?Cardiovascular status: blood pressure returned to baseline and stable ?Postop Assessment: no apparent nausea or vomiting ?Anesthetic complications: no ? ? ?No notable events documented. ? ?Last Vitals:  ?Vitals:  ? 03/31/21 1415 03/31/21 1430  ?BP: (!) 111/51 119/65  ?Pulse: 78 77  ?Resp: 13 13  ?Temp:  37.1 ?C  ?SpO2: 97% 97%  ?  ?Last Pain:  ?Vitals:  ? 03/31/21 1430  ?TempSrc:   ?PainSc: 0-No pain  ? ? ?  ?  ?  ?  ?  ?  ? ?Sammy Douthitt A. ? ? ? ? ?

## 2021-03-31 NOTE — Interval H&P Note (Signed)
History and Physical Interval Note: ? ?03/31/2021 ?9:43 AM ? ?Megan Nunez  has presented today for surgery, with the diagnosis of djd right shoulder.  The various methods of treatment have been discussed with the patient and family. After consideration of risks, benefits and other options for treatment, the patient has consented to  Procedure(s): ?TOTAL SHOULDER ARTHROPLASTY (Right) versus ?REVERSE SHOULDER ARTHROPLASTY (Right) as a surgical intervention.  The patient's history has been reviewed, patient examined, no change in status, stable for surgery.  I have reviewed the patient's chart and labs.  Questions were answered to the patient's satisfaction.   ? ? ?Eulas Post ? ? ?

## 2021-03-31 NOTE — Anesthesia Preprocedure Evaluation (Signed)
Anesthesia Evaluation  ?Patient identified by MRN, date of birth, ID band ?Patient awake ? ? ? ?Reviewed: ?Allergy & Precautions, NPO status , Patient's Chart, lab work & pertinent test results ? ?History of Anesthesia Complications ?(+) Family history of anesthesia reaction ? ?Airway ?Mallampati: III ? ?TM Distance: >3 FB ?Neck ROM: Full ? ? ? Dental ? ?(+) Chipped, Dental Advisory Given ?  ?Pulmonary ?neg pulmonary ROS,  ?  ?Pulmonary exam normal ?breath sounds clear to auscultation ? ? ? ? ? ? Cardiovascular ?negative cardio ROS ?Normal cardiovascular exam ?Rhythm:Regular Rate:Normal ? ? ?  ?Neuro/Psych ?PSYCHIATRIC DISORDERS Anxiety Depression negative neurological ROS ?   ? GI/Hepatic ?negative GI ROS, (+) Hepatitis -  ?Endo/Other  ?Obesity ? Renal/GU ?negative Renal ROS  ?negative genitourinary ?  ?Musculoskeletal ? ?(+) Arthritis , DJD right shoulder  ? Abdominal ?(+) + obese,   ?Peds ? Hematology ?negative hematology ROS ?(+)   ?Anesthesia Other Findings ? ? Reproductive/Obstetrics ? ?  ? ? ? ? ? ? ? ? ? ? ? ? ? ?  ?  ? ? ? ? ? ? ? ? ?Anesthesia Physical ?Anesthesia Plan ? ?ASA: 2 ? ?Anesthesia Plan: General  ? ?Post-op Pain Management: Regional block*  ? ?Induction: Intravenous ? ?PONV Risk Score and Plan: 4 or greater and Treatment may vary due to age or medical condition, Ondansetron and Dexamethasone ? ?Airway Management Planned: Oral ETT ? ?Additional Equipment: None ? ?Intra-op Plan:  ? ?Post-operative Plan: Extubation in OR ? ?Informed Consent: I have reviewed the patients History and Physical, chart, labs and discussed the procedure including the risks, benefits and alternatives for the proposed anesthesia with the patient or authorized representative who has indicated his/her understanding and acceptance.  ? ? ? ?Dental advisory given ? ?Plan Discussed with: CRNA and Anesthesiologist ? ?Anesthesia Plan Comments:   ? ? ? ? ? ? ?Anesthesia Quick Evaluation ? ?

## 2021-03-31 NOTE — Anesthesia Procedure Notes (Signed)
Procedure Name: Intubation ?Date/Time: 03/31/2021 11:20 AM ?Performed by: Lavina Hamman, CRNA ?Pre-anesthesia Checklist: Patient identified, Emergency Drugs available, Suction available, Patient being monitored and Timeout performed ?Patient Re-evaluated:Patient Re-evaluated prior to induction ?Oxygen Delivery Method: Circle system utilized ?Preoxygenation: Pre-oxygenation with 100% oxygen ?Induction Type: IV induction ?Ventilation: Mask ventilation without difficulty ?Laryngoscope Size: Mac and 3 ?Grade View: Grade II ?Tube type: Oral ?Tube size: 7.0 mm ?Number of attempts: 1 ?Airway Equipment and Method: Stylet ?Placement Confirmation: ETT inserted through vocal cords under direct vision, positive ETCO2, CO2 detector and breath sounds checked- equal and bilateral ?Secured at: 21 cm ?Tube secured with: Tape ?Dental Injury: Teeth and Oropharynx as per pre-operative assessment  ?Comments: ATOI ? ? ? ? ?

## 2021-03-31 NOTE — Discharge Instructions (Signed)
Diet: As you were doing prior to hospitalization  ? ?Shower:  May shower but keep the wounds dry, use an occlusive plastic wrap, NO SOAKING IN TUB.  If the bandage gets wet, change with a clean dry gauze.  ? ?Dressing:  Unless you have significant drainage through your dressing, keep dressing in place until follow up visit. There are sticky tapes (steri-strips) on your wounds and all the stitches are absorbable.  Leave the steri-strips in place when changing your dressings, they will peel off with time, usually 2-3 weeks. ? ?Activity:  Increase activity slowly as tolerated, but follow the weight bearing instructions below.  The rules on driving is that you can not be taking narcotics while you drive, and you must feel in control of the vehicle.   ? ?Weight Bearing:   No weight bearing with right arm.  ? ?To prevent constipation: you may use a stool softener such as - ? ?Colace (over the counter) 100 mg by mouth twice a day  ?Drink plenty of fluids (prune juice may be helpful) and high fiber foods ?Miralax (over the counter) for constipation as needed.   ? ?Itching:  If you experience itching with your medications, try taking only a single pain pill, or even half a pain pill at a time.  You may take up to 10 pain pills per day, and you can also use benadryl over the counter for itching or also to help with sleep.  ? ?Precautions:  If you experience chest pain or shortness of breath - call 911 immediately for transfer to the hospital emergency department!! ? ?If you develop a fever greater that 101 F, purulent drainage from wound, increased redness or drainage from wound, or calf pain -- Call the office at [phone]                                                ?Follow- Up Appointment:  Please call for an appointment to be seen in 2 weeks [place] - [phone] ? ? ?  ?

## 2021-03-31 NOTE — Plan of Care (Signed)
  Problem: Education: Goal: Knowledge of General Education information will improve Description Including pain rating scale, medication(s)/side effects and non-pharmacologic comfort measures Outcome: Progressing   

## 2021-03-31 NOTE — Progress Notes (Signed)
Assisted Dr. Foster with right, ultrasound guided, interscalene  block. Side rails up, monitors on throughout procedure. See vital signs in flow sheet. Tolerated Procedure well. 

## 2021-04-01 ENCOUNTER — Encounter (HOSPITAL_COMMUNITY): Payer: Self-pay | Admitting: Orthopedic Surgery

## 2021-04-01 DIAGNOSIS — K759 Inflammatory liver disease, unspecified: Secondary | ICD-10-CM | POA: Diagnosis not present

## 2021-04-01 DIAGNOSIS — M19011 Primary osteoarthritis, right shoulder: Secondary | ICD-10-CM | POA: Diagnosis not present

## 2021-04-01 DIAGNOSIS — M6281 Muscle weakness (generalized): Secondary | ICD-10-CM | POA: Diagnosis not present

## 2021-04-01 DIAGNOSIS — F32A Depression, unspecified: Secondary | ICD-10-CM | POA: Diagnosis not present

## 2021-04-01 LAB — BASIC METABOLIC PANEL
Anion gap: 7 (ref 5–15)
BUN: 12 mg/dL (ref 8–23)
CO2: 24 mmol/L (ref 22–32)
Calcium: 8.6 mg/dL — ABNORMAL LOW (ref 8.9–10.3)
Chloride: 106 mmol/L (ref 98–111)
Creatinine, Ser: 0.65 mg/dL (ref 0.44–1.00)
GFR, Estimated: >60 mL/min (ref >60)
Glucose, Bld: 131 mg/dL — ABNORMAL HIGH (ref 70–99)
Potassium: 4.2 mmol/L (ref 3.5–5.1)
Sodium: 137 mmol/L (ref 135–145)

## 2021-04-01 LAB — CBC
HCT: 37.8 % (ref 36.0–46.0)
Hemoglobin: 12.6 g/dL (ref 12.0–15.0)
MCH: 30.1 pg (ref 26.0–34.0)
MCHC: 33.3 g/dL (ref 30.0–36.0)
MCV: 90.2 fL (ref 80.0–100.0)
Platelets: 187 10*3/uL (ref 150–400)
RBC: 4.19 MIL/uL (ref 3.87–5.11)
RDW: 13.7 % (ref 11.5–15.5)
WBC: 15.8 10*3/uL — ABNORMAL HIGH (ref 4.0–10.5)
nRBC: 0 % (ref 0.0–0.2)

## 2021-04-01 NOTE — Evaluation (Signed)
Occupational Therapy Evaluation ?Patient Details ?Name: Megan Nunez ?MRN: [id number] ?DOB: 1945/05/03 ?Today's Date: 04/01/2021 ? ? ?History of Present Illness 76 year old woman s/p R TSA  ? ?Clinical Impression ?  ?Mrs. Megan Nunez is a 76 year old woman s/p shoulder replacement without functional use of right non-dominant upper extremity secondary to effects of surgery and interscalene block and shoulder precautions. Therapist provided education and instruction to patient and spouse in regards to exercises, precautions, positioning, donning upper extremity clothing and bathing while maintaining shoulder precautions, ice and edema management using iceman cuff and cooler and donning/doffing sling. Patient and spouse verbalized understanding and demonstrated as needed. Patient needed assistance to donn shirt, underwear, pants, socks and shoes and provided with instruction on compensatory strategies to perform ADLs. Patient to follow up with MD for further therapy needs.  ?  ?   ? ?Recommendations for follow up therapy are one component of a multi-disciplinary discharge planning process, led by the attending physician.  Recommendations may be updated based on patient status, additional functional criteria and insurance authorization.  ? ?Follow Up Recommendations ? Follow physician's recommendations for discharge plan and follow up therapies  ?  ?Assistance Recommended at Discharge Intermittent Supervision/Assistance  ?Patient can return home with the following A little help with bathing/dressing/bathroom;Assistance with cooking/housework ? ?  ?Functional Status Assessment ? Patient has had a recent decline in their functional status and demonstrates the ability to make significant improvements in function in a reasonable and predictable amount of time.  ?Equipment Recommendations ? None recommended by OT  ?  ?Recommendations for Other Services   ? ? ?  ?Precautions / Restrictions Precautions ?Precautions: Shoulder ?Type  of Shoulder Precautions: No AROM, NO PROM, okay for AROM of elbow, hand and wrist ?Shoulder Interventions: Shoulder sling/immobilizer;At all times;Off for dressing/bathing/exercises ?Precaution Booklet Issued:  (handouts) ?Required Braces or Orthoses: Sling ?Restrictions ?Weight Bearing Restrictions: Yes  ? ?  ? ?Mobility Bed Mobility ?Overal bed mobility: Modified Independent ?  ?  ?  ?  ?  ?  ?  ?  ? ?Transfers ?Overall transfer level: Modified independent ?  ?  ?  ?  ?  ?  ?  ?  ?  ?  ? ?  ?Balance Overall balance assessment: No apparent balance deficits (not formally assessed) ?  ?  ?  ?  ?  ?  ?  ?  ?  ?  ?  ?  ?  ?  ?  ?  ?  ?  ?   ? ?ADL either performed or assessed with clinical judgement  ? ?ADL   ?  ?  ?  ?  ?  ?  ?  ?  ?  ?  ?  ?  ?  ?  ?  ?  ?  ?  ?  ?   ? ? ? ?Vision Patient Visual Report: No change from baseline ?   ?   ?Perception   ?  ?Praxis   ?  ? ?Pertinent Vitals/Pain Pain Assessment ?Pain Assessment: No/denies pain  ? ? ? ?Hand Dominance Left ?  ?Extremity/Trunk Assessment Upper Extremity Assessment ?Upper Extremity Assessment: RUE deficits/detail ?RUE Deficits / Details: impaird AROM secondary to interscalene block ?  ?Lower Extremity Assessment ?Lower Extremity Assessment: Overall WFL for tasks assessed ?  ?Cervical / Trunk Assessment ?Cervical / Trunk Assessment: Normal ?  ?Communication Communication ?Communication: No difficulties ?  ?Cognition Arousal/Alertness: Awake/alert ?Behavior During Therapy: [facility] for tasks assessed/performed ?Overall Cognitive  Status: Within Functional Limits for tasks assessed ?  ?  ?  ?  ?  ?  ?  ?  ?  ?  ?  ?  ?  ?  ?  ?  ?  ?  ?  ?General Comments    ? ?  ?Exercises   ?  ?Shoulder Instructions Shoulder Instructions ?Donning/doffing shirt without moving shoulder: Caregiver independent with task ?Method for sponge bathing under operated UE: Independent;Caregiver independent with task ?Donning/doffing sling/immobilizer: Caregiver independent with task ?Correct  positioning of sling/immobilizer: Independent ?ROM for elbow, wrist and digits of operated UE: Independent ?Sling wearing schedule (on at all times/off for ADL's): Independent ?Proper positioning of operated UE when showering: Independent ?Dressing change: Independent ?Positioning of UE while sleeping: Independent  ? ? ?Home Living Family/patient expects to be discharged to:: Private residence ?Living Arrangements: Spouse/significant other ?Available Help at Discharge: Family;Available 24 hours/day ?Type of Home: House ?Home Access: Level entry ?  ?  ?Home Layout: Two level ?Alternate Level Stairs-Number of Steps: 2 ?Alternate Level Stairs-Rails: Left ?Bathroom Shower/Tub: Walk-in shower ?  ?  ?  ?  ?  ?  ?  ?  ? ?  ?Prior Functioning/Environment   ?  ?  ?  ?  ?  ?  ?  ?  ?  ? ?  ?  ?OT Problem List: Decreased strength;Decreased range of motion;Impaired UE functional use;Pain ?  ?   ?OT Treatment/Interventions:    ?  ?OT Goals(Current goals can be found in the care plan section) Acute Rehab OT Goals ?OT Goal Formulation: All assessment and education complete, DC therapy  ?OT Frequency:   ?  ? ?Co-evaluation   ?  ?  ?  ?  ? ?  ?AM-PAC OT "6 Clicks" Daily Activity     ?Outcome Measure Help from another person eating meals?: A Little ?Help from another person taking care of personal grooming?: A Little ?Help from another person toileting, which includes using toliet, bedpan, or urinal?: A Little ?Help from another person bathing (including washing, rinsing, drying)?: A Little ?Help from another person to put on and taking off regular upper body clothing?: A Little ?Help from another person to put on and taking off regular lower body clothing?: A Little ?6 Click Score: 18 ?  ?End of Session Nurse Communication:  (OT education complete) ? ?Activity Tolerance: Patient tolerated treatment well ?Patient left: in chair;with family/visitor present ? ?OT Visit Diagnosis: Muscle weakness (generalized) (M62.81)  ?               ?Time: 8416-6063 ?OT Time Calculation (min): 18 min ?Charges:  OT General Charges ?$OT Visit: 1 Visit ?OT Evaluation ?$OT Eval Low Complexity: 1 Low ? ?Jenney Brester, OTR/L ?Acute Care Rehab Services  ?Office [phone] ?Pager: [phone]  ? ?Arion Morgan L Georgeana Oertel ?04/01/2021, 9:51 AM ?

## 2021-04-01 NOTE — Progress Notes (Signed)
? ? ? ?  Subjective: ?1 Day Post-Op s/p Procedure(s): ?TOTAL SHOULDER ARTHROPLASTY ? ? Patient is alert, oriented. Patient reports pain as mild this morning. Block has started to wear off but not completely.  ?Denies chest pain, SOB, Calf pain. No nausea/vomiting. No other complaints. ?  ? ?Objective:  ?PE: ?VITALS:   ?Vitals:  ? 03/31/21 1939 03/31/21 2230 04/01/21 0204 04/01/21 0556  ?BP: (!) 142/66 (!) 142/58 (!) 141/74 (!) 117/55  ?Pulse: 100 (!) 109 100 73  ?Resp: 18 18 18 18   ?Temp: 97.7 ?F (36.5 ?C) 98.1 ?F (36.7 ?C) (!) 97.5 ?F (36.4 ?C) 97.6 ?F (36.4 ?C)  ?TempSrc:    Oral  ?SpO2: 94% 95% 95% 98%  ?Weight:      ?Height:      ? ?General: sitting up in bed, in no acute distress ?Resp: normal respiratory effort ?MSK: RUE in sling. Dressing CDI. Able to flex, extend, and abduct all fingers of right hand.  ? ?LABS ? ?Results for orders placed or performed during the hospital encounter of 03/31/21 (from the past 24 hour(s))  ?CBC     Status: Abnormal  ? Collection Time: 04/01/21  3:25 AM  ?Result Value Ref Range  ? WBC 15.8 (H) 4.0 - 10.5 K/uL  ? RBC 4.19 3.87 - 5.11 MIL/uL  ? Hemoglobin 12.6 12.0 - 15.0 g/dL  ? HCT 37.8 36.0 - 46.0 %  ? MCV 90.2 80.0 - 100.0 fL  ? MCH 30.1 26.0 - 34.0 pg  ? MCHC 33.3 30.0 - 36.0 g/dL  ? RDW 13.7 11.5 - 15.5 %  ? Platelets 187 150 - 400 K/uL  ? nRBC 0.0 0.0 - 0.2 %  ?Basic metabolic panel     Status: Abnormal  ? Collection Time: 04/01/21  3:25 AM  ?Result Value Ref Range  ? Sodium 137 135 - 145 mmol/L  ? Potassium 4.2 3.5 - 5.1 mmol/L  ? Chloride 106 98 - 111 mmol/L  ? CO2 24 22 - 32 mmol/L  ? Glucose, Bld 131 (H) 70 - 99 mg/dL  ? BUN 12 8 - 23 mg/dL  ? Creatinine, Ser 0.65 0.44 - 1.00 mg/dL  ? Calcium 8.6 (L) 8.9 - 10.3 mg/dL  ? GFR, Estimated >60 >60 mL/min  ? Anion gap 7 5 - 15  ? ? ?DG Shoulder Right Port ? ?Result Date: 03/31/2021 ?CLINICAL DATA:  Postoperative right shoulder arthroplasty EXAM: RIGHT SHOULDER - 1 VIEW COMPARISON:  None. FINDINGS: Status post right shoulder  arthroplasty with expected overlying postoperative change. No evidence of perihardware fracture or component malpositioning. IMPRESSION: Status post right shoulder arthroplasty with expected overlying postoperative change. No evidence of perihardware fracture or component malpositioning. Electronically Signed   By: 04/02/2021 M.D.   On: 03/31/2021 14:11   ? ?Assessment/Plan: ?Principal Problem: ?  Primary osteoarthritis, right shoulder ?Active Problems: ?  S/P shoulder replacement, right ? ?1 Day Post-Op s/p Procedure(s): ?TOTAL SHOULDER ARTHROPLASTY ? ?Weightbearing: NWB RUE ?Insicional and dressing care: Dressings left intact until follow-up ?Orthopedic device(s): sling at all times ?Pain control: continue current regimen ?Follow - up plan: 2 weeks with Dr. 04/02/2021 ?Dispo: plan to discharge home after working with OT this morning ? ?Contact information:   ?Weekdays [address] [place] A ?fter hours and holidays please check [email or web address] for group call information for [facility] ? ?[phone] ?04/01/2021, 9:40 AM  ?

## 2021-04-01 NOTE — Progress Notes (Signed)
Transition of Care (TOC) Screening Note ? ?Patient Details  ?Name: Megan Nunez ?Date of Birth: [birth year] ? ?Transition of Care (TOC) CM/SW Contact:    ?Ewing Schlein, LCSW ?Phone Number: ?04/01/2021, 9:58 AM ? ?Transition of Care Department [facility]) has reviewed patient and no TOC needs have been identified at this time. We will continue to monitor patient advancement through interdisciplinary progression rounds. If new patient transition needs arise, please place a TOC consult. ?

## 2021-04-01 NOTE — Progress Notes (Signed)
Patient discharged to home w/ family. Given all belongings, instructions, equipment. Verbalized understanding of instructions. Escorted to pov via w/c. 

## 2021-04-02 NOTE — Discharge Summary (Signed)
Discharge Summary  ?Patient ID: ?Megan Nunez ?MRN: [id number] ?DOB/AGE: [birth year] 76 y.o. ? ?Admit date: 03/31/2021 ?Discharge date: 04/01/2021 ? ?Admission Diagnoses:  ?Primary osteoarthritis, right shoulder ? ?Discharge Diagnoses:  ?Principal Problem: ?  Primary osteoarthritis, right shoulder ?Active Problems: ?  S/P shoulder replacement, right ? ? ?Past Medical History:  ?Diagnosis Date  ? Anxiety   ? Arthritis   ? Depression   ? Family history of adverse reaction to anesthesia 2012  ? Mother has cardiac  arrest after surgery  ? Hepatitis   ? high antibodies for hepatitis  ? Obesity   ? ? ?Surgeries: Procedure(s): ?TOTAL SHOULDER ARTHROPLASTY on 03/31/2021 ?  ?Consultants (if any):  ? ?Discharged Condition: Improved ? ?Hospital Course: Megan Nunez is an 76 y.o. female who was admitted 03/31/2021 with a diagnosis of Primary osteoarthritis, right shoulder and went to the operating room on 03/31/2021 and underwent the above named procedures.   ? ?She was given perioperative antibiotics:  ?Anti-infectives (From admission, onward)  ? ? Start     Dose/Rate Route Frequency Ordered Stop  ? 03/31/21 1600  ceFAZolin (ANCEF) IVPB 2g/100 mL premix       ? 2 g ?200 mL/hr over 30 Minutes Intravenous Every 6 hours 03/31/21 1501 04/01/21 0505  ? 03/31/21 0830  ceFAZolin (ANCEF) IVPB 2g/100 mL premix       ? 2 g ?200 mL/hr over 30 Minutes Intravenous On call to O.R. 03/31/21 0820 03/31/21 1105  ? ?  ?. ? ?She was given sequential compression devices, early ambulation for DVT prophylaxis. ? ?She benefited maximally from the hospital stay and there were no complications.   ? ?Recent vital signs:  ?Vitals:  ? 04/01/21 0556 04/01/21 0953  ?BP: (!) 117/55 130/60  ?Pulse: 73 75  ?Resp: 18 17  ?Temp: 97.6 ?F (36.4 ?C) 98 ?F (36.7 ?C)  ?SpO2: 98% 99%  ? ? ?Recent laboratory studies:  ?Lab Results  ?Component Value Date  ? HGB 12.6 04/01/2021  ? HGB 14.8 03/19/2021  ? HGB 13.0 08/06/2020  ? ?Lab Results  ?Component Value Date  ? WBC 15.8 (H)  04/01/2021  ? PLT 187 04/01/2021  ? ?No results found for: INR ?Lab Results  ?Component Value Date  ? NA 137 04/01/2021  ? K 4.2 04/01/2021  ? CL 106 04/01/2021  ? CO2 24 04/01/2021  ? BUN 12 04/01/2021  ? CREATININE 0.65 04/01/2021  ? GLUCOSE 131 (H) 04/01/2021  ? ? ?Discharge Medications:   ?Allergies as of 04/01/2021   ? ?   Reactions  ? Nickel Itching, Rash  ? ?  ? ?  ?Medication List  ?  ? ?TAKE these medications   ? ?ARIPiprazole 5 MG tablet ?Commonly known as: Abilify ?Take 1 tablet (5 mg total) by mouth daily. ?  ?B-complex with vitamin C tablet ?Take 1 tablet by mouth daily. ?  ?BIOTIN PO ?Take 10,000 mcg by mouth daily. ?  ?buPROPion 300 MG 24 hr tablet ?Commonly known as: WELLBUTRIN XL ?Take 1 tablet (300 mg total) by mouth daily. ?  ?DULoxetine 30 MG capsule ?Commonly known as: CYMBALTA ?TAKE ONE CAPSULE BY MOUTH DAILY TAKE WITH 60 MG ?  ?DULoxetine 60 MG capsule ?Commonly known as: CYMBALTA ?Take 1 capsule (60 mg total) by mouth every morning. Take with a 30 mg capsule to equal total dose of 90 mg ?  ?Ginkgo Biloba 120 MG Tabs ?Take 120 mg by mouth daily. ?  ?ondansetron 4 MG tablet ?Commonly known as: Zofran ?  Take 1 tablet (4 mg total) by mouth every 8 (eight) hours as needed for nausea or vomiting. ?  ?oxyCODONE 5 MG immediate release tablet ?Commonly known as: Roxicodone ?Take 1 tablet (5 mg total) by mouth every 4 (four) hours as needed for severe pain. ?  ?polyvinyl alcohol 1.4 % ophthalmic solution ?Commonly known as: LIQUIFILM TEARS ?Place 1 drop into both eyes as needed for dry eyes. ?  ?sennosides-docusate sodium 8.6-50 MG tablet ?Commonly known as: SENOKOT-S ?Take 2 tablets by mouth daily. ?  ?Turmeric 500 MG Caps ?Take 500 mg by mouth daily. ?  ?Vitamin D 50 MCG (2000 UT) tablet ?Take 2,000 Units by mouth daily. ?  ? ?  ? ? ?Diagnostic Studies: DG Shoulder Right Port ? ?Result Date: 03/31/2021 ?CLINICAL DATA:  Postoperative right shoulder arthroplasty EXAM: RIGHT SHOULDER - 1 VIEW COMPARISON:   None. FINDINGS: Status post right shoulder arthroplasty with expected overlying postoperative change. No evidence of perihardware fracture or component malpositioning. IMPRESSION: Status post right shoulder arthroplasty with expected overlying postoperative change. No evidence of perihardware fracture or component malpositioning. Electronically Signed   By: Jearld Lesch M.D.   On: 03/31/2021 14:11   ? ?Disposition: Discharge disposition: 01-Home or Self Care ? ? ? ? ? ? ? ? ? Follow-up Information   ? ? Teryl Lucy, MD. Schedule an appointment as soon as possible for a visit in 2 day(s).   ?Specialty: Orthopedic Surgery ?Contact information: ?[address] ?[address] ?[place] [address] ?[phone] ? ? ?  ?  ? ?  ?  ? ?  ? ? ? ?Signed: ?Armida Sans PA-C ?04/02/2021, 2:27 PM ? ?  ?

## 2021-04-08 ENCOUNTER — Telehealth: Payer: Self-pay | Admitting: Pharmacist

## 2021-04-08 NOTE — Chronic Care Management (AMB) (Signed)
? ? ?Chronic Care Management ?Pharmacy Assistant  ? ?Name: Megan Nunez  MRN: [id number] DOB: [birth year] ? ?Reason for Encounter: Disease State General Assessment ?  ?Recent office visits:  ? ?04/15/21 Ronnald Nian, MD - Patient presented for Osteopenia and other concerns. No medication changes ? ?02/13/21 Ronnald Nian, MD - Patient presented for Pre- Op Clearance and other concerns. No medication changes. ? ?Recent consult visits:  ?03/27/21 Lexine Baton - Patient presented for Bilateral impacted cerumen and other concerns. No medication changes. ? ?03/19/21 Teryl Lucy MD - Patient presented to [facility] for Pre Admission Testing ? ?02/24/21 - Patient presented to [facility] for CT Shoulder Left  & Right WO Contrast ? ?Hospital visits:  ?Patient presented to [facility] on 04/10/21 due to forehead laceration. Patient was present for 2 hours. ? ?New?Medications Started at [facility] Discharge:?? ?-started  ?none ? ?Medication Changes at Hospital Discharge: ?-Changed  ?None ? ?Medications Discontinued at Hospital Discharge: ?-Stopped  ?none ? ?Medications that remain the same after Hospital Discharge:??  ?-All other medications will remain the same.   ? ? ? ? ? ? ?Medication Reconciliation was completed by comparing discharge summary, patient?s EMR and Pharmacy list, and upon discussion with patient. ? ?Patient presented to [facility] on 03/31/21 due to Primary osteoarthritis of right shoulder. Patient was present for 26 hours. ? ?New?Medications Started at [facility] Discharge:?? ?-started  ?ondansetron (Zofran) ?oxyCODONE (Roxicodone) ?sennosides-docusate sodium (SENOKOT-S) ? ?Medication Changes at Hospital Discharge: ?-Changed  ?None ? ?Medications Discontinued at Hospital Discharge: ?-Stopped  ?none ? ?Medications that remain the same after Hospital Discharge:??  ?-All other medications will remain the same.   ? ?Medications: ?Outpatient  Encounter Medications as of 04/08/2021  ?Medication Sig  ? ARIPiprazole (ABILIFY) 5 MG tablet Take 1 tablet (5 mg total) by mouth daily.  ? B Complex-C (B-COMPLEX WITH VITAMIN C) tablet Take 1 tablet by mouth daily. (Patient not taking: Reported on 03/27/2021)  ? BIOTIN PO Take 10,000 mcg by mouth daily. (Patient not taking: Reported on 03/27/2021)  ? buPROPion (WELLBUTRIN XL) 300 MG 24 hr tablet Take 1 tablet (300 mg total) by mouth daily.  ? Cholecalciferol (VITAMIN D) 50 MCG (2000 UT) tablet Take 2,000 Units by mouth daily. (Patient not taking: Reported on 03/27/2021)  ? DULoxetine (CYMBALTA) 30 MG capsule TAKE ONE CAPSULE BY MOUTH DAILY TAKE WITH 60 MG  ? DULoxetine (CYMBALTA) 60 MG capsule Take 1 capsule (60 mg total) by mouth every morning. Take with a 30 mg capsule to equal total dose of 90 mg  ? Ginkgo Biloba 120 MG TABS Take 120 mg by mouth daily. (Patient not taking: Reported on 03/27/2021)  ? ondansetron (ZOFRAN) 4 MG tablet Take 1 tablet (4 mg total) by mouth every 8 (eight) hours as needed for nausea or vomiting.  ? oxyCODONE (ROXICODONE) 5 MG immediate release tablet Take 1 tablet (5 mg total) by mouth every 4 (four) hours as needed for severe pain.  ? polyvinyl alcohol (LIQUIFILM TEARS) 1.4 % ophthalmic solution Place 1 drop into both eyes as needed for dry eyes. (Patient not taking: Reported on 03/27/2021)  ? sennosides-docusate sodium (SENOKOT-S) 8.6-50 MG tablet Take 2 tablets by mouth daily.  ? Turmeric 500 MG CAPS Take 500 mg by mouth daily. (Patient not taking: Reported on 03/27/2021)  ? ?No facility-administered encounter medications on file as of 04/08/2021.  ?Notes: ?Patient reports she has seen her PCP on today for  her AWV, she has been doing fine is awaiting to see what if any statin she will be starting pending her recent labwork with PCP, she reports no questions or concerns at this time ? ?Care Gaps: ?AWV- 3/22 ? ?Star Rating Drugs: ?None ? ? ? ?Pamala Duffel CMA ?Clinical Pharmacist  Assistant ?[phone] ? ?

## 2021-04-10 ENCOUNTER — Telehealth: Payer: Self-pay

## 2021-04-10 ENCOUNTER — Emergency Department (HOSPITAL_BASED_OUTPATIENT_CLINIC_OR_DEPARTMENT_OTHER)
Admission: EM | Admit: 2021-04-10 | Discharge: 2021-04-10 | Disposition: A | Discharge status: Home / Self Care | Payer: Medicare Other | Attending: Emergency Medicine | Admitting: Emergency Medicine

## 2021-04-10 ENCOUNTER — Other Ambulatory Visit: Payer: Self-pay

## 2021-04-10 ENCOUNTER — Encounter (HOSPITAL_BASED_OUTPATIENT_CLINIC_OR_DEPARTMENT_OTHER): Payer: Self-pay

## 2021-04-10 DIAGNOSIS — W010XXA Fall on same level from slipping, tripping and stumbling without subsequent striking against object, initial encounter: Secondary | ICD-10-CM | POA: Diagnosis not present

## 2021-04-10 DIAGNOSIS — S60512A Abrasion of left hand, initial encounter: Secondary | ICD-10-CM | POA: Insufficient documentation

## 2021-04-10 DIAGNOSIS — S0181XA Laceration without foreign body of other part of head, initial encounter: Secondary | ICD-10-CM | POA: Insufficient documentation

## 2021-04-10 DIAGNOSIS — W19XXXA Unspecified fall, initial encounter: Secondary | ICD-10-CM

## 2021-04-10 DIAGNOSIS — S0990XA Unspecified injury of head, initial encounter: Secondary | ICD-10-CM | POA: Diagnosis not present

## 2021-04-10 MED ORDER — LIDOCAINE-EPINEPHRINE-TETRACAINE (LET) TOPICAL GEL
3.0000 mL | Freq: Once | TOPICAL | Status: AC
  Administered 2021-04-10: 3 mL via TOPICAL
  Filled 2021-04-10: qty 3

## 2021-04-10 MED ORDER — LIDOCAINE HCL (PF) 1 % IJ SOLN
5.0000 mL | Freq: Once | INTRAMUSCULAR | Status: AC
  Administered 2021-04-10: 5 mL
  Filled 2021-04-10: qty 5

## 2021-04-10 NOTE — Telephone Encounter (Signed)
Pt. Husband called stating that she just fell while they were walking outside while wearing flip flops. She tripped and hit her head and it was bleeding. Her husband said he cleaned it up but it may need stiches. Per JCL I instructed pt. She needs to go to the ER incase they need to do a scan of her head and they can stitch it up there.  ?

## 2021-04-10 NOTE — ED Triage Notes (Signed)
Onset three hours ago tripped down hill falling onto head.  Laceration to right forehead.  Denies LOC or blood thinners  NAD.  Wearing right shoulder sling due to surgery about one week ago ?

## 2021-04-10 NOTE — Discharge Instructions (Addendum)
Have the stitches taken out in about 5 to 7 days.  You have 8 running stitches. ?

## 2021-04-10 NOTE — ED Provider Notes (Signed)
?[facility] ?Provider Note ? ? ?CSN: [id number] ?Arrival date & time: 04/10/21  1243 ? ?  ? ?History ? ?Chief Complaint  ?Patient presents with  ? Laceration  ? ? ?Megan Nunez is a 76 y.o. female. ? ? ?Laceration ?Patient presents after a fall.  Hit head.  Recent shoulder replacement a few weeks ago.  Hit right forehead without loss conscious.  Not on blood thinners.  Also landed on left hand but states only mild pain there.  She thinks tetanus is up-to-date.  No neck pain.  No confusion.  No difficulty walking. ?  ? ?Home Medications ?Prior to Admission medications   ?Medication Sig Start Date End Date Taking? Authorizing Provider  ?ARIPiprazole (ABILIFY) 5 MG tablet Take 1 tablet (5 mg total) by mouth daily. 02/05/21   Corie Chiquito, PMHNP  ?B Complex-C (B-COMPLEX WITH VITAMIN C) tablet Take 1 tablet by mouth daily. ?Patient not taking: Reported on 03/27/2021    [provider]  ?BIOTIN PO Take 10,000 mcg by mouth daily. ?Patient not taking: Reported on 03/27/2021    [provider]  ?buPROPion (WELLBUTRIN XL) 300 MG 24 hr tablet Take 1 tablet (300 mg total) by mouth daily. 02/05/21   Corie Chiquito, PMHNP  ?Cholecalciferol (VITAMIN D) 50 MCG (2000 UT) tablet Take 2,000 Units by mouth daily. ?Patient not taking: Reported on 03/27/2021    [provider]  ?DULoxetine (CYMBALTA) 30 MG capsule TAKE ONE CAPSULE BY MOUTH DAILY TAKE WITH 60 MG 02/05/21   Corie Chiquito, PMHNP  ?DULoxetine (CYMBALTA) 60 MG capsule Take 1 capsule (60 mg total) by mouth every morning. Take with a 30 mg capsule to equal total dose of 90 mg 02/05/21 05/06/21  Corie Chiquito, PMHNP  ?Ginkgo Biloba 120 MG TABS Take 120 mg by mouth daily. ?Patient not taking: Reported on 03/27/2021    [provider]  ?ondansetron (ZOFRAN) 4 MG tablet Take 1 tablet (4 mg total) by mouth every 8 (eight) hours as needed for nausea or vomiting. 03/31/21   Armida Sans, PA-C  ?oxyCODONE (ROXICODONE) 5  MG immediate release tablet Take 1 tablet (5 mg total) by mouth every 4 (four) hours as needed for severe pain. 03/31/21   Armida Sans, PA-C  ?polyvinyl alcohol (LIQUIFILM TEARS) 1.4 % ophthalmic solution Place 1 drop into both eyes as needed for dry eyes. ?Patient not taking: Reported on 03/27/2021    [provider]  ?sennosides-docusate sodium (SENOKOT-S) 8.6-50 MG tablet Take 2 tablets by mouth daily. 03/31/21   Armida Sans, PA-C  ?Turmeric 500 MG CAPS Take 500 mg by mouth daily. ?Patient not taking: Reported on 03/27/2021    [provider]  ?   ? ?Allergies    ?Nickel   ? ?Review of Systems   ?Review of Systems  ?Constitutional:  Negative for appetite change.  ?Skin:  Positive for wound.  ?Neurological:  Negative for weakness.  ? ?Physical Exam ?Updated Vital Signs ?BP (!) 126/58   Pulse 92   Temp 98.5 ?F (36.9 ?C)   Resp 17   Ht 5' (1.524 m)   Wt 76.7 kg   SpO2 94%   BMI 33.01 kg/m?  ?Physical Exam ?Vitals and nursing note reviewed.  ?HENT:  ?   Head:  ?   Comments: Approximately 2 and half centimeter laceration horizontally across right forehead. ?Eyes:  ?   Pupils: Pupils are equal, round, and reactive to light.  ?Cardiovascular:  ?   Rate and Rhythm:  Regular rhythm.  ?Pulmonary:  ?   Breath sounds: No wheezing or rhonchi.  ?Abdominal:  ?   Tenderness: There is no abdominal tenderness.  ?Musculoskeletal:  ?   Cervical back: No tenderness.  ?   Comments: Abrasion to left hand without underlying bony tenderness.  ?Skin: ?   Capillary Refill: Capillary refill takes less than 2 seconds.  ?Neurological:  ?   Mental Status: She is alert and oriented to person, place, and time.  ? ? ?ED Results / Procedures / Treatments   ?Labs ?(all labs ordered are listed, but only abnormal results are displayed) ?Labs Reviewed - No data to display ? ?EKG ?None ? ?Radiology ?No results found. ? ?Procedures ?Marland Kitchen.Laceration Repair ? ?Date/Time: 04/10/2021 5:13 PM ?Performed by: Benjiman Core,  MD ?Authorized by: Benjiman Core, MD  ? ?Consent:  ?  Consent obtained:  Verbal ?  Consent given by:  Patient ?  Risks discussed:  Infection, pain, poor cosmetic result, need for additional repair, poor wound healing and nerve damage ?  Alternatives discussed:  No treatment ?Anesthesia:  ?  Anesthesia method:  Local infiltration and topical application ?  Topical anesthetic:  LET ?  Local anesthetic:  Lidocaine 1% w/o epi ?Laceration details:  ?  Location:  Face ?  Face location:  Forehead ?  Length (cm):  2.5 ?Pre-procedure details:  ?  Preparation:  Patient was prepped and draped in usual sterile fashion ?Exploration:  ?  Limited defect created (wound extended): no   ?  Hemostasis achieved with:  Direct pressure ?  Wound exploration: wound explored through full range of motion   ?Treatment:  ?  Area cleansed with:  Shur-Clens ?  Amount of cleaning:  Standard ?  Visualized foreign bodies/material removed: no   ?Skin repair:  ?  Repair method:  Sutures ?  Suture size:  6-0 ?  Suture material:  Prolene ?  Suture technique:  Running ?  Number of sutures:  8 ?Approximation:  ?  Approximation:  Close ?Repair type:  ?  Repair type:  Simple ?Post-procedure details:  ?  Procedure completion:  Tolerated well, no immediate complications  ? ? ?Medications Ordered in ED ?Medications  ?lidocaine-EPINEPHrine-tetracaine (LET) topical gel (3 mLs Topical Given by Other 04/10/21 1535)  ?lidocaine (PF) (XYLOCAINE) 1 % injection 5 mL (5 mLs Infiltration Given by Other 04/10/21 1535)  ? ? ?ED Course/ Medical Decision Making/ A&P ?  ?                        ?Medical Decision Making ?Risk ?Prescription drug management. ? ? ?Patient with mechanical fall.  Lost her balance and tripped.  Hit her head.  No loss conscious.  Does have laceration.  Closed with a running stitches.  Mild abrasion to hand without apparent underlying bony injury.  Appears stable for discharge.  Do not feels about need head CT imaging.  Discharge home.  Not on  blood thinners.  Reviewed previous note from recent shoulder replacement. ? ? ? ? ? ? ? ?Final Clinical Impression(s) / ED Diagnoses ?Final diagnoses:  ?Forehead laceration, initial encounter  ?Fall, initial encounter  ?Minor head injury, initial encounter  ? ? ?Rx / DC Orders ?ED Discharge Orders   ? ? None  ? ?  ? ? ?  ?Benjiman Core, MD ?04/10/21 1714 ? ?

## 2021-04-10 NOTE — ED Notes (Signed)
RN provided AVS using Teachback Method. Patient verbalizes understanding of Discharge Instructions. Opportunity for Questioning and Answers were provided by RN. Patient Discharged from ED ambulatory to Home with Family. ? ?

## 2021-04-10 NOTE — ED Notes (Addendum)
Forehead laceration repair done by Rubin Payor, MD. Pt tolerated well. Outcome successful.  ?

## 2021-04-13 DIAGNOSIS — M19011 Primary osteoarthritis, right shoulder: Secondary | ICD-10-CM | POA: Diagnosis not present

## 2021-04-13 NOTE — Telephone Encounter (Signed)
I called pt. Back pt my pt. Ping report she was recently went to the ED after a fall were she received a head laceration. She said she is doing fine just wanted to know if she could get her stiches out at her apt. On 04/15/21 she has a AWV. I told her you would take a look at them then and if with in the time frame to get them out she could get them out then.  ?

## 2021-04-15 ENCOUNTER — Other Ambulatory Visit: Payer: Self-pay

## 2021-04-15 ENCOUNTER — Ambulatory Visit (INDEPENDENT_AMBULATORY_CARE_PROVIDER_SITE_OTHER): Payer: Medicare Other | Admitting: Family Medicine

## 2021-04-15 ENCOUNTER — Encounter: Payer: Self-pay | Admitting: Family Medicine

## 2021-04-15 VITALS — BP 130/62 | HR 89 | Temp 96.1°F | Ht 60.0 in | Wt 162.2 lb

## 2021-04-15 DIAGNOSIS — I700 Atherosclerosis of aorta: Secondary | ICD-10-CM

## 2021-04-15 DIAGNOSIS — Z96651 Presence of right artificial knee joint: Secondary | ICD-10-CM

## 2021-04-15 DIAGNOSIS — Z0000 Encounter for general adult medical examination without abnormal findings: Secondary | ICD-10-CM | POA: Diagnosis not present

## 2021-04-15 DIAGNOSIS — M1990 Unspecified osteoarthritis, unspecified site: Secondary | ICD-10-CM | POA: Diagnosis not present

## 2021-04-15 DIAGNOSIS — Z4802 Encounter for removal of sutures: Secondary | ICD-10-CM

## 2021-04-15 DIAGNOSIS — F330 Major depressive disorder, recurrent, mild: Secondary | ICD-10-CM

## 2021-04-15 DIAGNOSIS — Z1211 Encounter for screening for malignant neoplasm of colon: Secondary | ICD-10-CM

## 2021-04-15 DIAGNOSIS — Z96652 Presence of left artificial knee joint: Secondary | ICD-10-CM | POA: Diagnosis not present

## 2021-04-15 DIAGNOSIS — M8580 Other specified disorders of bone density and structure, unspecified site: Secondary | ICD-10-CM

## 2021-04-15 DIAGNOSIS — N3946 Mixed incontinence: Secondary | ICD-10-CM

## 2021-04-15 NOTE — Progress Notes (Signed)
Megan Nunez is a 76 y.o. female who presents for annual wellness visit and follow-up on chronic medical conditions.  She recently had left shoulder replacement and still recovering from this.  She has not started into physical therapy.  She has had both knees replaced.  She continues in counseling for her underlying depression which seems to be going quite well.  She is getting good insight into what caused her problems.  She has also had some stress incontinence and has been using Kegel maneuvers as well as occasionally wearing a pad.  She is comfortable with continuing that.  She also has a history of osteopenia and has been using vitamin D and calcium.  Recent x-rays does show evidence of aortic atherosclerosis. ? ?Immunizations and Health Maintenance ?Immunization History  ?Administered Date(s) Administered  ? Fluad Quad(high Dose 65+) 01/01/2020, 02/13/2021  ? Influenza Split 01/27/2011  ? Influenza, High Dose Seasonal PF 11/14/2013, 11/05/2014, 12/25/2015, 09/23/2016, 01/03/2018  ? Influenza,inj,Quad PF,6+ Mos 03/20/2013  ? PFIZER(Purple Top)SARS-COV-2 Vaccination 02/25/2019, 03/22/2019, 11/30/2019  ? 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 Pneumococcal Conjugate-13 07/12/2013  ? Pneumococcal Polysaccharide-23 11/05/2014  ? Tdap 08/27/2008, 02/12/2018  ? Zoster Recombinat (Shingrix) 02/12/2018, 08/04/2018  ? Zoster, Live 12/30/2008  ? ?Health Maintenance Due  ?Topic Date Due  ? Fecal DNA (Cologuard)  02/04/2021  ? ? ?Last Pap smear: aged out  ?Last mammogram: 10/21/20 ?Last colonoscopy: cologuard 02/04/2018 ?Last DEXA:10/19/19 ?Dentist:Two or three times a year ?Ophtho: Q Year ?Exercise: N/a  ? ?Other doctors caring for patient include: Dr. Dion Saucier ortho ?           PMHP Corie Chiquito ?            ? ?Advanced directives: ?Does Patient Have a Medical Advance Directive?: Yes ?Type of Advance Directive: Healthcare Power of Attorney ?Does patient want to make changes to medical advance  directive?: No - Patient declined ?Copy of Healthcare Power of Attorney in Chart?: Yes - validated most recent copy scanned in chart (See row information) ?Would patient like information on creating a medical advance directive?: No - Patient declined ? ?Depression screen:  See questionnaire below.  ? ?  04/15/2021  ?  8:31 AM 03/27/2021  ?  3:12 PM 02/13/2021  ? 10:13 AM 05/28/2020  ? 12:13 PM 04/10/2020  ?  8:38 AM  ?Depression screen PHQ 2/9  ?Decreased Interest 0 0 0 0 0  ?Down, Depressed, Hopeless 0 0 0 0 1  ?PHQ - 2 Score 0 0 0 0 1  ?Altered sleeping   0 0   ?Tired, decreased energy   0 0   ?Change in appetite   1 0   ?Feeling bad or failure about yourself    0 0   ?Trouble concentrating   1 0   ?Moving slowly or fidgety/restless   0 0   ?Suicidal thoughts   0 0   ?PHQ-9 Score   2 0   ?Difficult doing work/chores   Not difficult at all Not difficult at all   ? ? ?Fall Risk Screen: see questionnaire below. ? ?  04/15/2021  ?  8:30 AM 03/27/2021  ?  3:11 PM 10/23/2020  ?  4:07 PM 04/10/2020  ?  8:38 AM 04/09/2019  ? 11:42 AM  ?Fall Risk   ?Falls in the past year? 1 0 0 0 0  ?Number falls in past yr: 0 0 0 0   ?Injury with Fall? 1 0 0 0   ?  Risk for fall due to : Impaired balance/gait No Fall Risks No Fall Risks No Fall Risks   ?Follow up Falls evaluation completed Falls evaluation completed Falls evaluation completed Falls evaluation completed   ? ? ?ADL screen:  See questionnaire below ?Functional Status Survey: ?Is the patient deaf or have difficulty hearing?: No ?Does the patient have difficulty seeing, even when wearing glasses/contacts?: No ?Does the patient have difficulty concentrating, remembering, or making decisions?: Yes ?Does the patient have difficulty walking or climbing stairs?: No ?Does the patient have difficulty dressing or bathing?: No ?Does the patient have difficulty doing errands alone such as visiting a doctor's office or shopping?: No ? ? ?Review of Systems ?Constitutional: -, -unexpected weight  change, -anorexia, -fatigue ?Allergy: -sneezing, -itching, -congestion ?Dermatology: denies changing moles, rash, lumps ?ENT: -runny nose, -ear pain, -sore throat,  ?Cardiology:  -chest pain, -palpitations, -orthopnea, ?Respiratory: -cough, -shortness of breath, -dyspnea on exertion, -wheezing,  ?Gastroenterology: -abdominal pain, -nausea, -vomiting, -diarrhea, -constipation, -dysphagia ?Hematology: -bleeding or bruising problems ?Musculoskeletal: -arthralgias, -myalgias, -joint swelling, -back pain, - ?Ophthalmology: -vision changes,  ?Urology: -dysuria, -difficulty urinating,  -urinary frequency, -urgency, incontinence ?Neurology: -, -numbness, , -memory loss, -falls, -dizziness ? ? ? ?PHYSICAL EXAM: ?General Appearance: Alert, cooperative, no distress, appears stated age ?Head: Normocephalic, without obvious abnormality, atraumatic.  2 cm laceration with running stitches was evaluated then the sutures were removed. ?Eyes: PERRL, conjunctiva/corneas clear, EOM's intact,  ?Ears: Normal TM's and external ear canals ?Nose: Nares normal, mucosa normal, no drainage or sinus tenderness ?Throat: Lips, mucosa, and tongue normal; teeth and gums normal ?Neck: Supple, no lymphadenopathy;  thyroid:  no enlargement/tenderness/nodules; no carotid bruit or JVD ?Lungs: Clear to auscultation bilaterally without wheezes, rales or ronchi; respirations unlabored ?Heart: Regular rate and rhythm, S1 and S2 normal, no murmur, rubor gallop ? ?Lymph nodes: Cervical, supraclavicular, and axillary nodes normal ?Neurologic:  CNII-XII intact, normal strength, sensation and gait; reflexes 2+ and symmetric throughout ?Psych: Normal mood, affect, hygiene and grooming. ? ?ASSESSMENT/PLAN: ?Osteopenia, unspecified location ? ?Arthritis ? ?Major depressive disorder, recurrent episode, mild (HCC) ? ?S/P total knee replacement, right ? ?S/P TKR (total knee replacement), left ? ?Mixed incontinence urge and stress ? ?Atherosclerosis of aorta  (HCC) ? ?Visit for suture removal ? ?She will be set up for bone density follow-up.  Discussed the aortic atherosclerosis and potential need for statin.  We will wait for the blood results come in.  I encouraged her to continue with her counseling but seems to be going quite well.  Encouraged her to continue to do the Kegel maneuvers and use padding on an as-needed basis. ? ?Discussed  yearly mammograms;  healthy diet, including goals of calcium and vitamin D intake  Immunization recommendations discussed.  Colonoscopy recommendations reviewed ? ? ?Medicare Attestation ?I have personally reviewed: ?The patient's medical and social history ?Their use of alcohol, tobacco or illicit drugs ?Their current medications and supplements ?The patient's functional ability including ADLs,fall risks, home safety risks, cognitive, and hearing and visual impairment ?Diet and physical activities ?Evidence for depression or mood disorders ? ?The patient's weight, height, and BMI have been recorded in the chart.  I have made referrals, counseling, and provided education to the patient based on review of the above and I have provided the patient with a written personalized care plan for preventive services.   ? ? ?Sharlot Gowda, MD   04/15/2021  ?

## 2021-04-15 NOTE — Patient Instructions (Signed)
?  Megan Nunez , ?Thank you for taking time to come for your Medicare Wellness Visit. I appreciate your ongoing commitment to your health goals. Please review the following plan we discussed and let me know if I can assist you in the future.  ? ?These are the goals we discussed: ? Goals   ?None ?  ?  ?This is a list of the screening recommended for you and due dates:  ?Health Maintenance  ?Topic Date Due  ? Cologuard (Stool DNA test)  02/04/2021  ? Mammogram  10/22/2022  ? Tetanus Vaccine  02/13/2028  ? Pneumonia Vaccine  Completed  ? Flu Shot  Completed  ? DEXA scan (bone density measurement)  Completed  ? COVID-19 Vaccine  Completed  ? Hepatitis C Screening: USPSTF Recommendation to screen - Ages 14-79 yo.  Completed  ? Zoster (Shingles) Vaccine  Completed  ? HPV Vaccine  Aged Out  ?  ?

## 2021-04-16 LAB — LIPID PANEL
Chol/HDL Ratio: 3 ratio (ref 0.0–4.4)
Cholesterol, Total: 150 mg/dL (ref 100–199)
HDL: 50 mg/dL
LDL Chol Calc (NIH): 86 mg/dL (ref 0–99)
Triglycerides: 69 mg/dL (ref 0–149)
VLDL Cholesterol Cal: 14 mg/dL (ref 5–40)

## 2021-04-16 MED ORDER — ATORVASTATIN CALCIUM 20 MG PO TABS: 20.0000 mg | ORAL_TABLET | Freq: Every day | ORAL | 3 refills | Status: DC

## 2021-04-16 NOTE — Addendum Note (Signed)
Addended by: Denita Lung on: 04/16/2021 10:11 AM ? ? Modules accepted: Orders ? ?

## 2021-04-29 DIAGNOSIS — Z1211 Encounter for screening for malignant neoplasm of colon: Secondary | ICD-10-CM | POA: Diagnosis not present

## 2021-05-07 LAB — COLOGUARD: COLOGUARD: NEGATIVE

## 2021-05-11 DIAGNOSIS — M19011 Primary osteoarthritis, right shoulder: Secondary | ICD-10-CM | POA: Diagnosis not present

## 2021-05-13 ENCOUNTER — Ambulatory Visit (INDEPENDENT_AMBULATORY_CARE_PROVIDER_SITE_OTHER): Payer: Medicare Other | Admitting: Psychiatry

## 2021-05-13 ENCOUNTER — Encounter: Payer: Self-pay | Admitting: Psychiatry

## 2021-05-13 DIAGNOSIS — F3341 Major depressive disorder, recurrent, in partial remission: Secondary | ICD-10-CM

## 2021-05-13 MED ORDER — BUPROPION HCL ER (XL) 300 MG PO TB24: 300.0000 mg | ORAL_TABLET | Freq: Every day | ORAL | 1 refills | Status: DC

## 2021-05-13 MED ORDER — MODAFINIL 200 MG PO TABS: ORAL_TABLET | ORAL | 2 refills | Status: DC

## 2021-05-13 MED ORDER — DULOXETINE HCL 60 MG PO CPEP
60.0000 mg | ORAL_CAPSULE | Freq: Every morning | ORAL | 1 refills | Status: DC
Start: 2021-05-13 — End: 2021-11-12

## 2021-05-13 MED ORDER — DULOXETINE HCL 30 MG PO CPEP: ORAL_CAPSULE | ORAL | 1 refills | Status: DC

## 2021-05-13 MED ORDER — ARIPIPRAZOLE 5 MG PO TABS: 5.0000 mg | ORAL_TABLET | Freq: Every day | ORAL | 1 refills | Status: DC

## 2021-05-13 NOTE — Progress Notes (Signed)
Megan Nunez ?[id number] ?14-May-1945 ?76 y.o. ? ?Subjective:  ? ?Patient ID:  Megan Nunez is a 76 y.o. (DOB [birth year]) female. ? ?Chief Complaint:  ?Chief Complaint  ?Patient presents with  ? ADHD  ? Follow-up  ?  Depression, anxiety  ? ? ?HPI ?Megan Nunez presents to the office today for follow-up of depression, anxiety, and ADHD. She reports, "I'm mostly a cheerful person by nature" and that her mood can be affected by world events and the negative moods of others. She reports that at times she is not using her time as effectively as she thinks she should. She reports that concentration is difficult. She reports that her thoughts are "jumping all over the place" and is having difficulty following through with certain tasks. She reports that her mood is "steady... I can stay calm." "Mood has been good, motivation has been iffy." She reports that she is able to stay "objective where I would have been depressed" or anxious in some situations. 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 She reports feeling overwhelmed and "I need another person with me." Sleeping 7-8 hours a night. Denies SI.  ? ?She reports that husband's macular degeneration is progressing. She reports that he continues to drive most days. Husband now has hearing aids.  ? ?Past Psychiatric Medication Trials: ?Sertraline ?Prozac ?Cymbalta ?Wellbutrin XL ?Adderall ?Adderall XR ?Vyvanse ?  ?AIMS   ? ?Flowsheet Row Office Visit from 05/13/2021 in [facility] Office Visit from 02/05/2021 in [facility]  ?AIMS Total Score 1 0  ? ?  ? ?PHQ2-9   ? ?Flowsheet Row Office Visit from 04/15/2021 in [facility] Office Visit from 03/27/2021 in [facility] Office Visit from 02/13/2021 in [facility] Office Visit from 05/28/2020 in [facility] Office Visit from  04/10/2020 in [facility]  ?PHQ-2 Total Score 0 0 0 0 1  ?PHQ-9 Total Score -- -- 2 0 --  ? ?  ? ?Flowsheet Row ED from 04/10/2021 in [facility] Admission (Discharged) from 03/31/2021 in [facility] Pre-Admission Testing 60 from 03/19/2021 in [facility]PRE[facility]UR[facility] TEST[facility]  ?C-SSRS RISK CATEGORY No Risk No Risk No Risk  ? ?  ?  ? ?Review of Systems:  ?Review of Systems  ?Musculoskeletal:  Negative for gait problem.  ?     Recovering from shoulder replacement  ?Neurological:  Positive for tremors.  ?Psychiatric/Behavioral:    ?     Please refer to HPI  ? ?Medications: I have reviewed the patient's current medications. ? ?Current Outpatient Medications  ?Medication Sig Dispense Refill  ? atorvastatin (LIPITOR) 20 MG tablet Take 1 tablet (20 mg total) by mouth daily. 90 tablet 3  ? B Complex-C (B-COMPLEX WITH VITAMIN C) tablet Take 1 tablet by mouth daily.    ? BIOTIN PO Take 10,000 mcg by mouth daily.    ? Cholecalciferol (VITAMIN D) 50 MCG (2000 UT) tablet Take 2,000 Units by mouth daily.    ? modafinil (PROVIGIL) 200 MG tablet Take 1/2-1 tablet daily 30 tablet 2  ? sennosides-docusate sodium (SENOKOT-S) 8.6-50 MG tablet Take 2 tablets by mouth daily. (Patient taking differently: Take 2 tablets by mouth every other day.) 30 tablet 1  ? Turmeric 500 MG CAPS Take 500 mg by mouth daily.    ? ARIPiprazole (ABILIFY) 5  MG tablet Take 1 tablet (5 mg total) by mouth daily. 90 tablet 1  ? buPROPion (WELLBUTRIN XL) 300 MG 24 hr tablet Take 1 tablet (300 mg total) by mouth daily. 90 tablet 1  ? DULoxetine (CYMBALTA) 30 MG capsule TAKE ONE CAPSULE BY MOUTH DAILY TAKE WITH 60 MG 90 capsule 1  ? DULoxetine (CYMBALTA) 60 MG capsule Take 1 capsule (60 mg total) by mouth every morning. Take with a 30 mg capsule to equal total dose of 90 mg 90 capsule 1  ? ondansetron (ZOFRAN) 4 MG tablet Take 1 tablet (4 mg total) by mouth every 8 (eight) hours as needed for  nausea or vomiting. (Patient not taking: Reported on 04/15/2021) 10 tablet 0  ? oxyCODONE (ROXICODONE) 5 MG immediate release tablet Take 1 tablet (5 mg total) by mouth every 4 (four) hours as needed for severe pain. (Patient not taking: Reported on 05/13/2021) 30 tablet 0  ? polyvinyl alcohol (LIQUIFILM TEARS) 1.4 % ophthalmic solution Place 1 drop into both eyes as needed for dry eyes.    ? ?No current facility-administered medications for this visit.  ? ? ?Medication Side Effects: Other: Dry mouth ? ?Allergies:  ?Allergies  ?Allergen Reactions  ? Nickel Itching and Rash  ? ? ?Past Medical History:  ?Diagnosis Date  ? Anxiety   ? Arthritis   ? Depression   ? Family history of adverse reaction to anesthesia 2012  ? Mother has cardiac  arrest after surgery  ? Hepatitis   ? high antibodies for hepatitis  ? Obesity   ? ? ?Past Medical History, Surgical history, Social history, and Family history were reviewed and updated as appropriate.  ? ?Please see review of systems for further details on the patient's review from today.  ? ?Objective:  ? ?Physical Exam:  ?BP 103/61   Pulse 72  ? ?Physical Exam ?Constitutional:   ?   General: She is not in acute distress. ?Musculoskeletal:     ?   General: No deformity.  ?Neurological:  ?   Mental Status: She is alert and oriented to person, place, and time.  ?   Coordination: Coordination normal.  ?Psychiatric:     ?   Attention and Perception: Attention and perception normal. She does not perceive auditory or visual hallucinations.     ?   Mood and Affect: Mood normal. Mood is not anxious or depressed. Affect is not labile, blunt, angry or inappropriate.     ?   Speech: Speech normal.     ?   Behavior: Behavior normal.     ?   Thought Content: Thought content normal. Thought content is not paranoid or delusional. Thought content does not include homicidal or suicidal ideation. Thought content does not include homicidal or suicidal plan.     ?   Cognition and Memory: Cognition and  memory normal.     ?   Judgment: Judgment normal.  ?   Comments: Insight intact  ? ? ?Lab Review:  ?   ?Component Value Date/Time  ? NA 137 04/01/2021 0325  ? NA 141 04/10/2020 0927  ? K 4.2 04/01/2021 0325  ? CL 106 04/01/2021 0325  ? CO2 24 04/01/2021 0325  ? GLUCOSE 131 (H) 04/01/2021 0325  ? BUN 12 04/01/2021 0325  ? BUN 16 04/10/2020 0927  ? CREATININE 0.65 04/01/2021 0325  ? CREATININE 0.63 12/08/2016 1030  ? CALCIUM 8.6 (L) 04/01/2021 0325  ? PROT 7.1 03/19/2021 1436  ? PROT 6.9 04/10/2020 0927  ?  ALBUMIN 4.1 03/19/2021 1436  ? ALBUMIN 4.7 04/10/2020 0927  ? AST 19 03/19/2021 1436  ? ALT 15 03/19/2021 1436  ? ALKPHOS 75 03/19/2021 1436  ? BILITOT 0.6 03/19/2021 1436  ? BILITOT 0.2 04/10/2020 0927  ? GFRNONAA >60 04/01/2021 0325  ? GFRAA 88 04/09/2019 1239  ? ? ?   ?Component Value Date/Time  ? WBC 15.8 (H) 04/01/2021 0325  ? RBC 4.19 04/01/2021 0325  ? HGB 12.6 04/01/2021 0325  ? HGB 14.7 04/09/2019 1239  ? HCT 37.8 04/01/2021 0325  ? HCT 43.2 04/09/2019 1239  ? PLT 187 04/01/2021 0325  ? PLT 224 04/09/2019 1239  ? MCV 90.2 04/01/2021 0325  ? MCV 89 04/09/2019 1239  ? MCH 30.1 04/01/2021 0325  ? MCHC 33.3 04/01/2021 0325  ? RDW 13.7 04/01/2021 0325  ? RDW 12.8 04/09/2019 1239  ? LYMPHSABS 2.1 04/09/2019 1239  ? MONOABS 546 12/25/2015 1405  ? EOSABS 0.4 04/09/2019 1239  ? BASOSABS 0.1 04/09/2019 1239  ? ? ?No results found for: POCLITH, LITHIUM  ? ?No results found for: PHENYTOIN, PHENOBARB, VALPROATE, CBMZ  ? ?.res ?Assessment: Plan:   ? ?Pt seen for 30 minutes and time spent discussing recent symptoms and her concern about increased difficulty with concentration. She reports that her BP is now well controlled and does not think that Vyvanse was the cause of increased BP in the past. Discussed option of re-starting Vyvanse and monitoring BP or starting trial of Modafinil. Discussed Modafinil is used off-label for inattention and excessive daytime somnolence. Discussed potential benefits, risks, and side  effects of Modafinil and that it typically has less risk of cardiovascular side effects compared to stimulants.  Pt agrees to trial of Modafinil. Will start Modafinil 200 mg 1/2-1 tab po q am. May also take 1/

## 2021-06-03 ENCOUNTER — Ambulatory Visit (HOSPITAL_BASED_OUTPATIENT_CLINIC_OR_DEPARTMENT_OTHER): Payer: Medicare Other | Attending: Orthopedic Surgery | Admitting: Physical Therapy

## 2021-06-03 ENCOUNTER — Encounter (HOSPITAL_BASED_OUTPATIENT_CLINIC_OR_DEPARTMENT_OTHER): Payer: Self-pay | Admitting: Physical Therapy

## 2021-06-03 DIAGNOSIS — M25511 Pain in right shoulder: Secondary | ICD-10-CM | POA: Diagnosis not present

## 2021-06-03 DIAGNOSIS — M6281 Muscle weakness (generalized): Secondary | ICD-10-CM | POA: Diagnosis not present

## 2021-06-03 DIAGNOSIS — G8929 Other chronic pain: Secondary | ICD-10-CM | POA: Insufficient documentation

## 2021-06-03 DIAGNOSIS — M25611 Stiffness of right shoulder, not elsewhere classified: Secondary | ICD-10-CM | POA: Insufficient documentation

## 2021-06-03 NOTE — Therapy (Signed)
OUTPATIENT PHYSICAL THERAPY SHOULDER EVALUATION   Patient Name: Megan Nunez MRN: [id number] DOB:1945-02-03, 76 y.o., female Today's Date: 06/04/2021   PT End of Session - 06/04/21 0925     Visit Number 1    Number of Visits 16    Date for PT Re-Evaluation 07/30/21    PT Start Time 1300    PT Stop Time 1343    PT Time Calculation (min) 43 min             Past Medical History:  Diagnosis Date   Anxiety    Arthritis    Depression    Family history of adverse reaction to anesthesia 2012   Mother has cardiac  arrest after surgery   Hepatitis    high antibodies for hepatitis   Obesity    Past Surgical History:  Procedure Laterality Date   LAPAROSCOPIC TUBAL LIGATION  1985   TOTAL KNEE ARTHROPLASTY Right 04/15/2020   Procedure: TOTAL KNEE ARTHROPLASTY;  Surgeon: Teryl Lucy, MD;  Location: [facility];  Service: Orthopedics;  Laterality: Right;   TOTAL KNEE ARTHROPLASTY Left 08/05/2020   Procedure: TOTAL KNEE ARTHROPLASTY;  Surgeon: Teryl Lucy, MD;  Location: [facility];  Service: Orthopedics;  Laterality: Left;   TOTAL SHOULDER ARTHROPLASTY Right 03/31/2021   Procedure: TOTAL SHOULDER ARTHROPLASTY;  Surgeon: Teryl Lucy, MD;  Location: [facility];  Service: Orthopedics;  Laterality: Right;   WISDOM TOOTH EXTRACTION     age 81   Patient Active Problem List   Diagnosis Date Noted   S/P shoulder replacement, right 03/31/2021   S/P TKR (total knee replacement), left 08/05/2020   Eczematous dermatitis of upper eyelids of both eyes 05/28/2020   S/P total knee replacement, right 04/15/2020   Osteopenia 04/10/2020   Major depressive disorder, recurrent episode, mild (HCC) 10/31/2017   ADHD, predominantly inattentive type 10/31/2017   Primary osteoarthritis of right knee 09/09/2016   Arthritis 07/25/2012   Tinnitus 07/25/2012   Mixed incontinence urge and stress 07/25/2012    PCP: Dr Sharlot Gowda   REFERRING PROVIDER: Dr Teryl Lucy  REFERRING DIAG: Left Anatomical TSA    THERAPY DIAG:  Stiffness of right shoulder, not elsewhere classified  Muscle weakness (generalized)  Chronic right shoulder pain   ONSET DATE: 03/31/2021  SUBJECTIVE:                                                                                                                                                                                      SUBJECTIVE STATEMENT: Patient has a long history of right shoulder pain. She had a TSA on 03/31/2021. Since that point her pain is well controlled. She came out of the  sling at 6 weeks.   PERTINENT HISTORY: Bilateral knee replacement; left shoulder OA; depression, anxiety;   PAIN:  Are you having pain? Yes: NPRS scale: 0/10 can have abrupt pain at night Pain location: lateral shoulder into the posterior shoulder  Pain description: sharp / shooting pain  Aggravating factors: lying on it  Relieving factors: changing positions   PRECAUTIONS: Shoulder per protocol   WEIGHT BEARING RESTRICTIONS No  FALLS:  Has patient fallen in last 6 months? Yes. 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;   PLOF: Independent  PATIENT GOALS   To regain functional use of her right arm   OBJECTIVE:   DIAGNOSTIC FINDINGS:  Nothing at this time  PATIENT SURVEYS:  FOTO 56% right now 67% expected   COGNITION:  Overall cognitive status: Within functional limits for tasks assessed     SENSATION: WFL  POSTURE: Rounded shoulders / forward head   UPPER EXTREMITY ROM:   Passive ROM Right 06/04/2021 Left 06/04/2021  Shoulder flexion 145   Shoulder extension    Shoulder abduction    Shoulder adduction    Shoulder internal rotation 70    Shoulder external rotation 38 but not pushed to end range   Elbow flexion    Elbow extension    Wrist flexion    Wrist extension    Wrist ulnar deviation    Wrist radial deviation    Wrist pronation    Wrist supination     (Blank rows = not tested)  Active IR: can not reached behind her back. Patient advised not to push it.  UPPER EXTREMITY MMT:  MMT Right 06/04/2021 Left 06/04/2021  Shoulder flexion    Shoulder extension    Shoulder abduction    Shoulder adduction    Shoulder internal rotation    Shoulder external rotation     Middle trapezius    Lower trapezius    Elbow flexion    Elbow extension    Wrist flexion    Wrist extension    Wrist ulnar deviation    Wrist radial deviation    Wrist pronation    Wrist supination    Grip strength (lbs)    (Blank rows = not tested) Not perfroemd 2nd to recent surgery    JOINT MOBILITY TESTING:    PALPATION:  Tightness and TTP in upper trap    TODAY'S TREATMENT:     PATIENT EDUCATION: Education details: protocol going forward; symptom management  Person educated: Patient Education method: Explanation, Demonstration, Tactile cues, and Verbal cues Education comprehension: verbalized understanding, returned demonstration, verbal cues required, tactile cues required, and needs further education   HOME EXERCISE PROGRAM: Access Code: [email or web address] URL: [email or web address] Date: 06/03/2021 Prepared by: Lorayne Bender  Exercises - Supine Shoulder Flexion Extension AAROM with Dowel  - 1 x daily - 7 x weekly - 3 sets - 10 reps - Seated Shoulder External Rotation  - 1 x daily - 7 x weekly - 3 sets - 10 reps - Shoulder extension with resistance - Neutral  - 1 x daily - 7 x weekly - 3 sets - 10 reps - Scapular Retraction with Resistance  - 1 x daily - 7 x weekly - 3 sets - 10 reps  ASSESSMENT:  CLINICAL IMPRESSION: Patient is a 76 y.o. female who was seen today for physical therapy evaluation and treatment for  right anatomical shoulder replacement on 03/31/2021. She presents with expected limitations in  function, strength, and ROM. She would benefit from skilled therapy to improve functional ability to use her left arm.    OBJECTIVE  IMPAIRMENTS decreased activity tolerance, decreased ROM, decreased strength, impaired UE functional use, and pain.   ACTIVITY LIMITATIONS cleaning, community activity, meal prep, laundry, and yard work.   PERSONAL FACTORS 1-2 comorbidities: anxiety, depression, bilateral knee replacement  are also affecting patient's functional outcome.    REHAB POTENTIAL: Good  CLINICAL DECISION MAKING: Evolving/moderate complexity decreased functional use of her left arm   EVALUATION COMPLEXITY: Moderate   GOALS: Goals reviewed with patient? Yes  SHORT TERM GOALS: Target date: 07/02/2021  (Remove Blue Hyperlink)  Patient will demonstrate full passive shoulder flexion  Baseline: Goal status: INITIAL  2.  Patient will be independent with basic HEP.  Baseline:  Goal status: INITIAL  3.  The patient will report <1/10 pain with ADL's below shoulder height  Baseline:  Goal status: INITIAL  LONG TERM GOALS: Target date: 07/30/2021  (Remove Blue Hyperlink)  Patient will reach behind he back to L4 without pain in order to perform ADL's  Baseline:  Goal status: INITIAL  2.  Patient will lift 2 lb item to a shelf without pain  Baseline:  Goal status: INITIAL  3.  Patient will reach behind her head without pain  Baseline:  Goal status: INITIAL    PLAN: PT FREQUENCY: 2x/week  PT DURATION: 8 weeks  PLANNED INTERVENTIONS: Therapeutic exercises, Therapeutic activity, Neuromuscular re-education, Balance training, Gait training, Patient/Family education, Joint mobilization, Electrical stimulation, Cryotherapy, Moist heat, Taping, and Manual therapy  PLAN FOR NEXT SESSION: progress strengthening as tolerated. Add aand flexion; scap retraction/ shoulder extension, side lying er,    Dessie Comaavid J Markees Carns, PT DPT  06/04/2021, 9:26 AM

## 2021-06-04 ENCOUNTER — Encounter (HOSPITAL_BASED_OUTPATIENT_CLINIC_OR_DEPARTMENT_OTHER): Payer: Self-pay | Admitting: Physical Therapy

## 2021-06-05 ENCOUNTER — Ambulatory Visit (HOSPITAL_BASED_OUTPATIENT_CLINIC_OR_DEPARTMENT_OTHER): Payer: Medicare Other | Admitting: Physical Therapy

## 2021-06-05 DIAGNOSIS — M25511 Pain in right shoulder: Secondary | ICD-10-CM | POA: Diagnosis not present

## 2021-06-05 DIAGNOSIS — M6281 Muscle weakness (generalized): Secondary | ICD-10-CM

## 2021-06-05 DIAGNOSIS — G8929 Other chronic pain: Secondary | ICD-10-CM

## 2021-06-05 DIAGNOSIS — M25611 Stiffness of right shoulder, not elsewhere classified: Secondary | ICD-10-CM

## 2021-06-05 NOTE — Therapy (Signed)
OUTPATIENT PHYSICAL THERAPY SHOULDER EVALUATION   Patient Name: Megan Nunez MRN: [id number] DOB:May 18, 1945, 76 y.o., female Today's Date: 06/05/2021   PT End of Session - 06/05/21 1619     Visit Number 2    Number of Visits 16    Date for PT Re-Evaluation 07/30/21    PT Start Time 1503    PT Stop Time 1543    PT Time Calculation (min) 40 min    Activity Tolerance Patient tolerated treatment well    Behavior During Therapy [facility] for tasks assessed/performed              Past Medical History:  Diagnosis Date   Anxiety    Arthritis    Depression    Family history of adverse reaction to anesthesia 2012   Mother has cardiac  arrest after surgery   Hepatitis    high antibodies for hepatitis   Obesity    Past Surgical History:  Procedure Laterality Date   LAPAROSCOPIC TUBAL LIGATION  1985   TOTAL KNEE ARTHROPLASTY Right 04/15/2020   Procedure: TOTAL KNEE ARTHROPLASTY;  Surgeon: Teryl Lucy, MD;  Location: [facility];  Service: Orthopedics;  Laterality: Right;   TOTAL KNEE ARTHROPLASTY Left 08/05/2020   Procedure: TOTAL KNEE ARTHROPLASTY;  Surgeon: Teryl Lucy, MD;  Location: [facility];  Service: Orthopedics;  Laterality: Left;   TOTAL SHOULDER ARTHROPLASTY Right 03/31/2021   Procedure: TOTAL SHOULDER ARTHROPLASTY;  Surgeon: Teryl Lucy, MD;  Location: [facility];  Service: Orthopedics;  Laterality: Right;   WISDOM TOOTH EXTRACTION     age 32   Patient Active Problem List   Diagnosis Date Noted   S/P shoulder replacement, right 03/31/2021   S/P TKR (total knee replacement), left 08/05/2020   Eczematous dermatitis of upper eyelids of both eyes 05/28/2020   S/P total knee replacement, right 04/15/2020   Osteopenia 04/10/2020   Major depressive disorder, recurrent episode, mild (HCC) 10/31/2017   ADHD, predominantly inattentive type 10/31/2017   Primary osteoarthritis of right knee 09/09/2016   Arthritis 07/25/2012   Tinnitus 07/25/2012   Mixed incontinence urge and stress  07/25/2012    PCP: Dr Sharlot Gowda   REFERRING PROVIDER: Dr Teryl Lucy  REFERRING DIAG: Left Anatomical TSA   THERAPY DIAG:  Muscle weakness (generalized)  Stiffness of right shoulder, not elsewhere classified  Chronic right shoulder pain   ONSET DATE: 03/31/2021  SUBJECTIVE:                                                                                                                                                                                      SUBJECTIVE STATEMENT: Patient has a long history of right shoulder  pain. She had a TSA on 03/31/2021. Since that point her pain is well controlled. She came out of the sling at 6 weeks.   PERTINENT HISTORY: Bilateral knee replacement; left shoulder OA; depression, anxiety;   PAIN:  Are you having pain? Yes: NPRS scale: 0/10 can have abrupt pain at night Pain location: lateral shoulder into the posterior shoulder  Pain description: sharp / shooting pain  Aggravating factors: lying on it  Relieving factors: changing positions   PRECAUTIONS: Shoulder per protocol   WEIGHT BEARING RESTRICTIONS No  FALLS:  Has patient fallen in last 6 months? Yes. 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;   PLOF: Independent  PATIENT GOALS   To regain functional use of her right arm   OBJECTIVE:     TODAY'S TREATMENT:  5/19 Manual: gentle PROM into flexion and ER   Supine ER 2x20  Wand flexion 3x10   Side lying er 2x10   Pulleys 2 min flexion   Finger ladder x16 2x  Row red to neutral 2x10  Shoulder extension to neutral 2x10 red    Eval Exercises - Supine Shoulder Flexion Extension AAROM with Dowel  - 1 x daily - 7 x weekly - 3 sets - 10 reps - Seated Shoulder External Rotation  - 1 x daily - 7 x weekly - 3 sets - 10 reps - Shoulder extension with resistance - Neutral  - 1 x daily - 7 x weekly - 3 sets - 10 reps - Scapular Retraction  with Resistance  - 1 x daily - 7 x weekly - 3 sets - 10 reps  PATIENT EDUCATION: Education details: protocol going forward; symptom management  Person educated: Patient Education method: Explanation, Demonstration, Tactile cues, and Verbal cues Education comprehension: verbalized understanding, returned demonstration, verbal cues required, tactile cues required, and needs further education   HOME EXERCISE PROGRAM: Access Code: [email or web address] URL: [email or web address] Date: 06/03/2021 Prepared by: Lorayne Benderavid Josalyn Dettmann  Exercises - Supine Shoulder Flexion Extension AAROM with Dowel  - 1 x daily - 7 x weekly - 3 sets - 10 reps - Seated Shoulder External Rotation  - 1 x daily - 7 x weekly - 3 sets - 10 reps - Shoulder extension with resistance - Neutral  - 1 x daily - 7 x weekly - 3 sets - 10 reps - Scapular Retraction with Resistance  - 1 x daily - 7 x weekly - 3 sets - 10 reps  ASSESSMENT:  CLINICAL IMPRESSION: Patient is a 76 y.o. female who was seen today for physical therapy evaluation and treatment for  right anatomical shoulder replacement on 03/31/2021.   The patient tolerated treatment well. Per visual inspection she has had a significant improvement in passive shoulder ER. She tolerated there-ex well. She had not significant increase in pain. Therapy added side lying er and advanced her band for scapular strengthening. We will give her a red nand if she does well with technique next visit.  We will continue to progress as tolerated.   OBJECTIVE IMPAIRMENTS decreased activity tolerance, decreased ROM, decreased strength, impaired UE functional use, and pain.   ACTIVITY LIMITATIONS cleaning, community activity, meal prep, laundry, and yard work.   PERSONAL FACTORS 1-2 comorbidities: anxiety, depression, bilateral knee replacement  are also affecting patient's functional outcome.    REHAB POTENTIAL: Good  CLINICAL DECISION MAKING: Evolving/moderate complexity decreased  functional use of her left arm   EVALUATION COMPLEXITY: Moderate  GOALS: Goals reviewed with patient? Yes  SHORT TERM GOALS: Target date: 07/02/2021  (Remove Blue Hyperlink)  Patient will demonstrate full passive shoulder flexion  Baseline: Goal status: INITIAL  2.  Patient will be independent with basic HEP.  Baseline:  Goal status: INITIAL  3.  The patient will report <1/10 pain with ADL's below shoulder height  Baseline:  Goal status: INITIAL  LONG TERM GOALS: Target date: 07/30/2021  (Remove Blue Hyperlink)  Patient will reach behind he back to L4 without pain in order to perform ADL's  Baseline:  Goal status: INITIAL  2.  Patient will lift 2 lb item to a shelf without pain  Baseline:  Goal status: INITIAL  3.  Patient will reach behind her head without pain  Baseline:  Goal status: INITIAL    PLAN: PT FREQUENCY: 2x/week  PT DURATION: 8 weeks  PLANNED INTERVENTIONS: Therapeutic exercises, Therapeutic activity, Neuromuscular re-education, Balance training, Gait training, Patient/Family education, Joint mobilization, Electrical stimulation, Cryotherapy, Moist heat, Taping, and Manual therapy  PLAN FOR NEXT SESSION: progress strengthening as tolerated. Add aand flexion; scap retraction/ shoulder extension, side lying er,    Dessie Coma, PT DPT  06/05/2021, 4:21 PM

## 2021-06-08 ENCOUNTER — Encounter (HOSPITAL_BASED_OUTPATIENT_CLINIC_OR_DEPARTMENT_OTHER): Payer: Medicare Other | Admitting: Physical Therapy

## 2021-06-09 ENCOUNTER — Encounter (HOSPITAL_BASED_OUTPATIENT_CLINIC_OR_DEPARTMENT_OTHER): Payer: Self-pay | Admitting: Physical Therapy

## 2021-06-09 ENCOUNTER — Ambulatory Visit (HOSPITAL_BASED_OUTPATIENT_CLINIC_OR_DEPARTMENT_OTHER): Payer: Medicare Other | Admitting: Physical Therapy

## 2021-06-09 DIAGNOSIS — M6281 Muscle weakness (generalized): Secondary | ICD-10-CM

## 2021-06-09 DIAGNOSIS — M25611 Stiffness of right shoulder, not elsewhere classified: Secondary | ICD-10-CM | POA: Diagnosis not present

## 2021-06-09 DIAGNOSIS — G8929 Other chronic pain: Secondary | ICD-10-CM | POA: Diagnosis not present

## 2021-06-09 DIAGNOSIS — M25511 Pain in right shoulder: Secondary | ICD-10-CM | POA: Diagnosis not present

## 2021-06-09 NOTE — Therapy (Signed)
OUTPATIENT PHYSICAL THERAPY SHOULDER EVALUATION   Patient Name: Megan Nunez MRN: [id number] DOB:07-07-1945, 76 y.o., female Today's Date: 06/09/2021   PT End of Session - 06/09/21 1101     Visit Number 3    Number of Visits 16    Date for PT Re-Evaluation 07/30/21    PT Start Time 1059    PT Stop Time 1137    PT Time Calculation (min) 38 min    Activity Tolerance Patient tolerated treatment well    Behavior During Therapy WFL for tasks assessed/performed              Past Medical History:  Diagnosis Date   Anxiety    Arthritis    Depression    Family history of adverse reaction to anesthesia 2012   Mother has cardiac  arrest after surgery   Hepatitis    high antibodies for hepatitis   Obesity    Past Surgical History:  Procedure Laterality Date   LAPAROSCOPIC TUBAL LIGATION  1985   TOTAL KNEE ARTHROPLASTY Right 04/15/2020   Procedure: TOTAL KNEE ARTHROPLASTY;  Surgeon: Teryl Lucy, MD;  Location: [facility];  Service: Orthopedics;  Laterality: Right;   TOTAL KNEE ARTHROPLASTY Left 08/05/2020   Procedure: TOTAL KNEE ARTHROPLASTY;  Surgeon: Teryl Lucy, MD;  Location: [facility];  Service: Orthopedics;  Laterality: Left;   TOTAL SHOULDER ARTHROPLASTY Right 03/31/2021   Procedure: TOTAL SHOULDER ARTHROPLASTY;  Surgeon: Teryl Lucy, MD;  Location: [facility];  Service: Orthopedics;  Laterality: Right;   WISDOM TOOTH EXTRACTION     age 59   Patient Active Problem List   Diagnosis Date Noted   S/P shoulder replacement, right 03/31/2021   S/P TKR (total knee replacement), left 08/05/2020   Eczematous dermatitis of upper eyelids of both eyes 05/28/2020   S/P total knee replacement, right 04/15/2020   Osteopenia 04/10/2020   Major depressive disorder, recurrent episode, mild (HCC) 10/31/2017   ADHD, predominantly inattentive type 10/31/2017   Primary osteoarthritis of right knee 09/09/2016   Arthritis 07/25/2012   Tinnitus 07/25/2012   Mixed incontinence urge and stress  07/25/2012    PCP: Dr Sharlot Gowda   REFERRING PROVIDER: Dr Teryl Lucy  REFERRING DIAG: Left Anatomical TSA   THERAPY DIAG:  Muscle weakness (generalized)  Stiffness of right shoulder, not elsewhere classified  Chronic right shoulder pain   ONSET DATE: 03/31/2021  SUBJECTIVE:                                                                                                                                                                                      SUBJECTIVE STATEMENT: Doing pretty well. Seeing dr Berline Chough later this  week about left shoulder.   PERTINENT HISTORY: Bilateral knee replacement; left shoulder OA; depression, anxiety;   PAIN:  Are you having pain? Yes: NPRS scale: 0/10 can have abrupt pain at night Pain location: lateral shoulder into the posterior shoulder  Pain description: sharp / shooting pain  Aggravating factors: lying on it  Relieving factors: changing positions   PRECAUTIONS: Shoulder per protocol   WEIGHT BEARING RESTRICTIONS No  FALLS:  Has patient fallen in last 6 months? Yes. 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;   PLOF: Independent  PATIENT GOALS   To regain functional use of her right arm   OBJECTIVE:     TODAY'S TREATMENT:  5/23: Supine active flexion 0-90 with cues for scap retraction & depression Supine biceps curls with iso hold of scap retraction Supine shoulder flexion 75-120 with wand- cues for scap retraction MANUAL: passive flexion, STM to upper trap & deltoid Seated arm over pillow- scap retraciton, added ER Upper trap & levator stretch  5/19 Manual: gentle PROM into flexion and ER   Supine ER 2x20  Wand flexion 3x10   Side lying er 2x10   Pulleys 2 min flexion   Finger ladder x16 2x  Row red to neutral 2x10  Shoulder extension to neutral 2x10 red     PATIENT EDUCATION: Education details: protocol going forward;  symptom management  Person educated: Patient Education method: Explanation, Demonstration, Tactile cues, and Verbal cues Education comprehension: verbalized understanding, returned demonstration, verbal cues required, tactile cues required, and needs further education   HOME EXERCISE PROGRAM: Access Code: [email or web address] URL: [email or web address]   ASSESSMENT:  CLINICAL IMPRESSION: Able to flex to 90 with good control today. Focused on scapular control in motion to progress to higher ROM.   OBJECTIVE IMPAIRMENTS decreased activity tolerance, decreased ROM, decreased strength, impaired UE functional use, and pain.   ACTIVITY LIMITATIONS cleaning, community activity, meal prep, laundry, and yard work.   PERSONAL FACTORS 1-2 comorbidities: anxiety, depression, bilateral knee replacement  are also affecting patient's functional outcome.    REHAB POTENTIAL: Good  CLINICAL DECISION MAKING: Evolving/moderate complexity decreased functional use of her left arm   EVALUATION COMPLEXITY: Moderate   GOALS: Goals reviewed with patient? Yes  SHORT TERM GOALS: Target date: 07/02/2021   Patient will demonstrate full passive shoulder flexion  Baseline: Goal status: INITIAL  2.  Patient will be independent with basic HEP.  Baseline:  Goal status: INITIAL  3.  The patient will report <1/10 pain with ADL's below shoulder height  Baseline:  Goal status: INITIAL  LONG TERM GOALS: Target date: 07/30/2021  (Remove Blue Hyperlink)  Patient will reach behind he back to L4 without pain in order to perform ADL's  Baseline:  Goal status: INITIAL  2.  Patient will lift 2 lb item to a shelf without pain  Baseline:  Goal status: INITIAL  3.  Patient will reach behind her head without pain  Baseline:  Goal status: INITIAL    PLAN: PT FREQUENCY: 2x/week  PT DURATION: 8 weeks  PLANNED INTERVENTIONS: Therapeutic exercises, Therapeutic activity, Neuromuscular re-education, Balance  training, Gait training, Patient/Family education, Joint mobilization, Electrical stimulation, Cryotherapy, Moist heat, Taping, and Manual therapy  PLAN FOR NEXT SESSION: progress strengthening as tolerated. Add aand flexion; scap retraction/ shoulder extension, side lying er,    Army Fossa, PT DPT  06/09/2021, 7:49 PM

## 2021-06-11 ENCOUNTER — Ambulatory Visit (HOSPITAL_BASED_OUTPATIENT_CLINIC_OR_DEPARTMENT_OTHER): Payer: Medicare Other | Admitting: Physical Therapy

## 2021-06-11 ENCOUNTER — Encounter (HOSPITAL_BASED_OUTPATIENT_CLINIC_OR_DEPARTMENT_OTHER): Payer: Self-pay | Admitting: Physical Therapy

## 2021-06-11 DIAGNOSIS — M25512 Pain in left shoulder: Secondary | ICD-10-CM | POA: Diagnosis not present

## 2021-06-11 DIAGNOSIS — M6281 Muscle weakness (generalized): Secondary | ICD-10-CM | POA: Diagnosis not present

## 2021-06-11 DIAGNOSIS — M25611 Stiffness of right shoulder, not elsewhere classified: Secondary | ICD-10-CM

## 2021-06-11 DIAGNOSIS — G8929 Other chronic pain: Secondary | ICD-10-CM

## 2021-06-11 DIAGNOSIS — M9902 Segmental and somatic dysfunction of thoracic region: Secondary | ICD-10-CM | POA: Diagnosis not present

## 2021-06-11 DIAGNOSIS — M9908 Segmental and somatic dysfunction of rib cage: Secondary | ICD-10-CM | POA: Diagnosis not present

## 2021-06-11 DIAGNOSIS — M19012 Primary osteoarthritis, left shoulder: Secondary | ICD-10-CM | POA: Diagnosis not present

## 2021-06-11 DIAGNOSIS — M9901 Segmental and somatic dysfunction of cervical region: Secondary | ICD-10-CM | POA: Diagnosis not present

## 2021-06-11 DIAGNOSIS — M9907 Segmental and somatic dysfunction of upper extremity: Secondary | ICD-10-CM | POA: Diagnosis not present

## 2021-06-11 DIAGNOSIS — M25511 Pain in right shoulder: Secondary | ICD-10-CM | POA: Diagnosis not present

## 2021-06-11 NOTE — Therapy (Signed)
OUTPATIENT PHYSICAL THERAPY SHOULDER EVALUATION   Patient Name: Megan Nunez MRN: [id number] DOB:18-Sep-1945, 76 y.o., female Today's Date: 06/11/2021   PT End of Session - 06/11/21 1100     Visit Number 4    Number of Visits 16    Date for PT Re-Evaluation 07/30/21    PT Start Time 1059    PT Stop Time 1140    PT Time Calculation (min) 41 min    Activity Tolerance Patient tolerated treatment well    Behavior During Therapy [facility] for tasks assessed/performed              Past Medical History:  Diagnosis Date   Anxiety    Arthritis    Depression    Family history of adverse reaction to anesthesia 2012   Mother has cardiac  arrest after surgery   Hepatitis    high antibodies for hepatitis   Obesity    Past Surgical History:  Procedure Laterality Date   LAPAROSCOPIC TUBAL LIGATION  1985   TOTAL KNEE ARTHROPLASTY Right 04/15/2020   Procedure: TOTAL KNEE ARTHROPLASTY;  Surgeon: Marchia Bond, MD;  Location: [facility];  Service: Orthopedics;  Laterality: Right;   TOTAL KNEE ARTHROPLASTY Left 08/05/2020   Procedure: TOTAL KNEE ARTHROPLASTY;  Surgeon: Marchia Bond, MD;  Location: [facility];  Service: Orthopedics;  Laterality: Left;   TOTAL SHOULDER ARTHROPLASTY Right 03/31/2021   Procedure: TOTAL SHOULDER ARTHROPLASTY;  Surgeon: Marchia Bond, MD;  Location: [facility];  Service: Orthopedics;  Laterality: Right;   WISDOM TOOTH EXTRACTION     age 80   Patient Active Problem List   Diagnosis Date Noted   S/P shoulder replacement, right 03/31/2021   S/P TKR (total knee replacement), left 08/05/2020   Eczematous dermatitis of upper eyelids of both eyes 05/28/2020   S/P total knee replacement, right 04/15/2020   Osteopenia 04/10/2020   Major depressive disorder, recurrent episode, mild ([place]) 10/31/2017   ADHD, predominantly inattentive type 10/31/2017   Primary osteoarthritis of right knee 09/09/2016   Arthritis 07/25/2012   Tinnitus 07/25/2012   Mixed incontinence urge and stress  07/25/2012    PCP: Dr Jill Alexanders   REFERRING PROVIDER: Dr Marchia Bond  REFERRING DIAG: Left Anatomical TSA   THERAPY DIAG:  Muscle weakness (generalized)  Stiffness of right shoulder, not elsewhere classified  Chronic right shoulder pain   ONSET DATE: 03/31/2021  SUBJECTIVE:                                                                                                                                                                                      SUBJECTIVE STATEMENT: Was doing well until after choir practice I  was holding paper in front of me for 1.5 hr and had some discomfort in proximal biceps tendon.   PERTINENT HISTORY: Bilateral knee replacement; left shoulder OA; depression, anxiety;   PAIN:  Are you having pain? : NPRS scale: 0/10 can have abrupt pain at night Pain location: lateral shoulder into the posterior shoulder  Pain description: sharp / shooting pain  Aggravating factors: lying on it  Relieving factors: changing positions   PRECAUTIONS: Shoulder per protocol   WEIGHT BEARING RESTRICTIONS No  FALLS:  Has patient fallen in last 6 months? Yes. 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;   PLOF: Independent  PATIENT GOALS   To regain functional use of her right arm   OBJECTIVE:     TODAY'S TREATMENT:  5/25: MANUAL: STM to biceps, subscap, upper trap; AP GHJ mobs grade 3; passive flexion Supine flexion with wand Sidelying ER 3x10- cues for scap retraction Sidelying abd, short arc Sidelying abd to 90 with small circles- also in supine flexion to 90 Wall walk with liftoff Upper trap stretch Chin tuck  5/23: Supine active flexion 0-90 with cues for scap retraction & depression Supine biceps curls with iso hold of scap retraction Supine shoulder flexion 75-120 with wand- cues for scap retraction MANUAL: passive flexion, STM to upper trap & deltoid Seated  arm over pillow- scap retraciton, added ER Upper trap & levator stretch  5/19 Manual: gentle PROM into flexion and ER   Supine ER 2x20  Wand flexion 3x10   Side lying er 2x10   Pulleys 2 min flexion   Finger ladder x16 2x  Row red to neutral 2x10  Shoulder extension to neutral 2x10 red     PATIENT EDUCATION: Education details: exercise form/rationale Person educated: Patient Education method: Explanation, Demonstration, Tactile cues, and Verbal cues Education comprehension: verbalized understanding, returned demonstration, verbal cues required, tactile cues required, and needs further education   HOME EXERCISE PROGRAM: Access Code: [email or web address] URL: [email or web address]   ASSESSMENT:  CLINICAL IMPRESSION: Irritation in biceps tendon noted with tightness in biceps and surrounding musculature. Able to reduce tension with manual treatment and performed exercises without incr pain. Advised to do strengthening exercises about 3 days/week and ROM exercises others to avoid irritating the tendons.   OBJECTIVE IMPAIRMENTS decreased activity tolerance, decreased ROM, decreased strength, impaired UE functional use, and pain.   ACTIVITY LIMITATIONS cleaning, community activity, meal prep, laundry, and yard work.   PERSONAL FACTORS 1-2 comorbidities: anxiety, depression, bilateral knee replacement  are also affecting patient's functional outcome.    REHAB POTENTIAL: Good  CLINICAL DECISION MAKING: Evolving/moderate complexity decreased functional use of her left arm   EVALUATION COMPLEXITY: Moderate   GOALS: Goals reviewed with patient? Yes  SHORT TERM GOALS: Target date: 07/02/2021   Patient will demonstrate full passive shoulder flexion  Baseline: Goal status: INITIAL  2.  Patient will be independent with basic HEP.  Baseline:  Goal status: INITIAL  3.  The patient will report <1/10 pain with ADL's below shoulder height  Baseline:  Goal status:  INITIAL  LONG TERM GOALS: Target date: 07/30/2021  (Remove Blue Hyperlink)  Patient will reach behind he back to L4 without pain in order to perform ADL's  Baseline:  Goal status: INITIAL  2.  Patient will lift 2 lb item to a shelf without pain  Baseline:  Goal status: INITIAL  3.  Patient will reach behind her head without pain  Baseline:  Goal status: INITIAL    PLAN: PT FREQUENCY: 2x/week  PT DURATION: 8 weeks  PLANNED INTERVENTIONS: Therapeutic exercises, Therapeutic activity, Neuromuscular re-education, Balance training, Gait training, Patient/Family education, Joint mobilization, Electrical stimulation, Cryotherapy, Moist heat, Taping, and Manual therapy  PLAN FOR NEXT SESSION: cont to progress mid range stability for depression   Selinda Eon, PT DPT  06/11/2021, 11:50 AM

## 2021-06-17 ENCOUNTER — Ambulatory Visit (HOSPITAL_BASED_OUTPATIENT_CLINIC_OR_DEPARTMENT_OTHER): Payer: Medicare Other | Admitting: Physical Therapy

## 2021-06-17 ENCOUNTER — Encounter (HOSPITAL_BASED_OUTPATIENT_CLINIC_OR_DEPARTMENT_OTHER): Payer: Self-pay | Admitting: Physical Therapy

## 2021-06-17 DIAGNOSIS — M6281 Muscle weakness (generalized): Secondary | ICD-10-CM | POA: Diagnosis not present

## 2021-06-17 DIAGNOSIS — G8929 Other chronic pain: Secondary | ICD-10-CM

## 2021-06-17 DIAGNOSIS — M25611 Stiffness of right shoulder, not elsewhere classified: Secondary | ICD-10-CM | POA: Diagnosis not present

## 2021-06-17 DIAGNOSIS — M25511 Pain in right shoulder: Secondary | ICD-10-CM | POA: Diagnosis not present

## 2021-06-17 NOTE — Therapy (Signed)
OUTPATIENT PHYSICAL THERAPY SHOULDER EVALUATION   Patient Name: Megan Nunez MRN: [id number] DOB:[birth year], 76 y.o., female Today's Date: 06/17/2021      Past Medical History:  Diagnosis Date   Anxiety    Arthritis    Depression    Family history of adverse reaction to anesthesia 2012   Mother has cardiac  arrest after surgery   Hepatitis    high antibodies for hepatitis   Obesity    Past Surgical History:  Procedure Laterality Date   LAPAROSCOPIC TUBAL LIGATION  1985   TOTAL KNEE ARTHROPLASTY Right 04/15/2020   Procedure: TOTAL KNEE ARTHROPLASTY;  Surgeon: Marchia Bond, MD;  Location: [facility];  Service: Orthopedics;  Laterality: Right;   TOTAL KNEE ARTHROPLASTY Left 08/05/2020   Procedure: TOTAL KNEE ARTHROPLASTY;  Surgeon: Marchia Bond, MD;  Location: [facility];  Service: Orthopedics;  Laterality: Left;   TOTAL SHOULDER ARTHROPLASTY Right 03/31/2021   Procedure: TOTAL SHOULDER ARTHROPLASTY;  Surgeon: Marchia Bond, MD;  Location: [facility];  Service: Orthopedics;  Laterality: Right;   WISDOM TOOTH EXTRACTION     age 63   Patient Active Problem List   Diagnosis Date Noted   S/P shoulder replacement, right 03/31/2021   S/P TKR (total knee replacement), left 08/05/2020   Eczematous dermatitis of upper eyelids of both eyes 05/28/2020   S/P total knee replacement, right 04/15/2020   Osteopenia 04/10/2020   Major depressive disorder, recurrent episode, mild ([place]) 10/31/2017   ADHD, predominantly inattentive type 10/31/2017   Primary osteoarthritis of right knee 09/09/2016   Arthritis 07/25/2012   Tinnitus 07/25/2012   Mixed incontinence urge and stress 07/25/2012    PCP: Dr Jill Alexanders   REFERRING PROVIDER: Dr Marchia Bond  REFERRING DIAG: Left Anatomical TSA   THERAPY DIAG:  No diagnosis found.   ONSET DATE: 03/31/2021  SUBJECTIVE:                                                                                                                                                                                       SUBJECTIVE STATEMENT: Shoulder has been feeling good overall, felt mild irritation and pain following HEP this week that took an hour to complete.  PERTINENT HISTORY: Bilateral knee replacement; left shoulder OA; depression, anxiety;   PAIN:  Are you having pain? : NPRS scale: 0/10 can have abrupt pain at night Pain location: lateral shoulder into the posterior shoulder  Pain description: sharp / shooting pain  Aggravating factors: lying on it  Relieving factors: changing positions   PRECAUTIONS: Shoulder per protocol   WEIGHT BEARING RESTRICTIONS No  FALLS:  Has patient fallen in last 6 months? Yes. 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;   PLOF: Independent  PATIENT GOALS   To regain functional use of her right arm   OBJECTIVE:     TODAY'S TREATMENT:   5/31: Manual: PROM flexion and ER with grade I/II distraction  Supine flexion with wand 2x10 1lb Sidelying ER 2x10 Shoulder ABCs supine 2x10 Reviewed/Refining HEP  5/25: MANUAL: STM to biceps, subscap, upper trap; AP GHJ mobs grade 3; passive flexion Supine flexion with wand Sidelying ER 3x10- cues for scap retraction Sidelying abd, short arc Sidelying abd to 90 with small circles- also in supine flexion to 90 Wall walk with liftoff Upper trap stretch Chin tuck  5/23: Supine active flexion 0-90 with cues for scap retraction & depression Supine biceps curls with iso hold of scap retraction Supine shoulder flexion 75-120 with wand- cues for scap retraction MANUAL: passive flexion, STM to upper trap & deltoid Seated arm over pillow- scap retraciton, added ER Upper trap & levator stretch  5/19 Manual: gentle PROM into flexion and ER   Supine ER 2x20  Wand flexion 3x10   Side lying er 2x10   Pulleys 2 min flexion   Finger ladder x16 2x  Row red to neutral 2x10  Shoulder  extension to neutral 2x10 red     PATIENT EDUCATION: Education details: exercise form/rationale Person educated: Patient Education method: Explanation, Demonstration, Tactile cues, and Verbal cues Education comprehension: verbalized understanding, returned demonstration, verbal cues required, tactile cues required, and needs further education   HOME EXERCISE PROGRAM: Access Code: [email or web address] URL: [email or web address]   ASSESSMENT:  CLINICAL IMPRESSION:  Plan - 06/17/21 1421     Clinical Impression Statement Pt presents with mild pain and reports good function at home. Pt notes improvement following stretching and STM; tolerates exercises well in positions against gravity. Discussed HEP with patient and streamlined exercises to minimize irritation.              OBJECTIVE IMPAIRMENTS decreased activity tolerance, decreased ROM, decreased strength, impaired UE functional use, and pain.   ACTIVITY LIMITATIONS cleaning, community activity, meal prep, laundry, and yard work.   PERSONAL FACTORS 1-2 comorbidities: anxiety, depression, bilateral knee replacement  are also affecting patient's functional outcome.    REHAB POTENTIAL: Good  CLINICAL DECISION MAKING: Evolving/moderate complexity decreased functional use of her left arm   EVALUATION COMPLEXITY: Moderate   GOALS: Goals reviewed with patient? Yes  SHORT TERM GOALS: Target date: 07/02/2021   Patient will demonstrate full passive shoulder flexion  Baseline: Goal status: INITIAL  2.  Patient will be independent with basic HEP.  Baseline:  Goal status: INITIAL  3.  The patient will report <1/10 pain with ADL's below shoulder height  Baseline:  Goal status: INITIAL  LONG TERM GOALS: Target date: 07/30/2021  (Remove Blue Hyperlink)  Patient will reach behind he back to L4 without pain in order to perform ADL's  Baseline:  Goal status: INITIAL  2.  Patient will lift 2 lb item to a shelf without  pain  Baseline:  Goal status: INITIAL  3.  Patient will reach behind her head without pain  Baseline:  Goal status: INITIAL    PLAN: PT FREQUENCY: 2x/week  PT DURATION: 8 weeks  PLANNED INTERVENTIONS: Therapeutic exercises, Therapeutic activity, Neuromuscular re-education, Balance training, Gait training, Patient/Family education, Joint mobilization, Electrical stimulation, Cryotherapy, Moist heat, Taping, and Manual therapy  PLAN FOR NEXT SESSION: cont to progress mid range stability for depression  Carney Living, PT DPT  06/17/2021, 10:02 AM   Jolene Schimke SPT  06/17/2021  During this treatment session, the therapist was present, participating in and directing the treatment.

## 2021-06-19 ENCOUNTER — Ambulatory Visit (HOSPITAL_BASED_OUTPATIENT_CLINIC_OR_DEPARTMENT_OTHER): Payer: Medicare Other | Attending: Orthopedic Surgery | Admitting: Physical Therapy

## 2021-06-19 DIAGNOSIS — G8929 Other chronic pain: Secondary | ICD-10-CM | POA: Diagnosis not present

## 2021-06-19 DIAGNOSIS — M25511 Pain in right shoulder: Secondary | ICD-10-CM | POA: Insufficient documentation

## 2021-06-19 DIAGNOSIS — M6281 Muscle weakness (generalized): Secondary | ICD-10-CM | POA: Diagnosis not present

## 2021-06-19 DIAGNOSIS — M25611 Stiffness of right shoulder, not elsewhere classified: Secondary | ICD-10-CM | POA: Insufficient documentation

## 2021-06-19 DIAGNOSIS — M25562 Pain in left knee: Secondary | ICD-10-CM | POA: Diagnosis not present

## 2021-06-19 NOTE — Therapy (Unsigned)
OUTPATIENT PHYSICAL THERAPY SHOULDER EVALUATION   Patient Name: Megan Nunez MRN: [id number] DOB:12/19/1945, 76 y.o., female Today's Date: 06/20/2021   PT End of Session - 06/20/21 0947     Visit Number 6    Number of Visits 16    Date for PT Re-Evaluation 07/30/21    PT Start Time [id number]    PT Stop Time [id number]    PT Time Calculation (min) 43 min    Activity Tolerance Patient tolerated treatment well    Behavior During Therapy [facility] for tasks assessed/performed               Past Medical History:  Diagnosis Date   Anxiety    Arthritis    Depression    Family history of adverse reaction to anesthesia 2012   Mother has cardiac  arrest after surgery   Hepatitis    high antibodies for hepatitis   Obesity    Past Surgical History:  Procedure Laterality Date   [place] Right 04/15/2020   Procedure: TOTAL KNEE ARTHROPLASTY;  Surgeon: Marchia Bond, MD;  Location: [facility];  Service: Orthopedics;  Laterality: Right;   TOTAL KNEE ARTHROPLASTY Left 08/05/2020   Procedure: TOTAL KNEE ARTHROPLASTY;  Surgeon: Marchia Bond, MD;  Location: [facility];  Service: Orthopedics;  Laterality: Left;   TOTAL SHOULDER ARTHROPLASTY Right 03/31/2021   Procedure: TOTAL SHOULDER ARTHROPLASTY;  Surgeon: Marchia Bond, MD;  Location: [facility];  Service: Orthopedics;  Laterality: Right;   WISDOM TOOTH EXTRACTION     age 9   Patient Active Problem List   Diagnosis Date Noted   S/P shoulder replacement, right 03/31/2021   S/P TKR (total knee replacement), left 08/05/2020   Eczematous dermatitis of upper eyelids of both eyes 05/28/2020   S/P total knee replacement, right 04/15/2020   Osteopenia 04/10/2020   Major depressive disorder, recurrent episode, mild ([place]) 10/31/2017   ADHD, predominantly inattentive type 10/31/2017   Primary osteoarthritis of right knee 09/09/2016   Arthritis 07/25/2012   Tinnitus 07/25/2012   Mixed incontinence urge and stress  07/25/2012    PCP: Dr Jill Alexanders   REFERRING PROVIDER: Dr Marchia Bond  REFERRING DIAG: Right Anatomical TSA   THERAPY DIAG:  Stiffness of right shoulder, not elsewhere classified  Chronic right shoulder pain  Muscle weakness (generalized)   ONSET DATE: 03/31/2021  SUBJECTIVE:                                                                                                                                                                                      SUBJECTIVE STATEMENT: Pt states shoulder has been feeling okay  overall; felt good with no increased soreness following last session. Pt states some twinges and mild irritation with overhead movement.   PERTINENT HISTORY: Bilateral knee replacement; left shoulder OA; depression, anxiety;   PAIN:  Are you having pain? : NPRS scale: 0/10  Pain location: lateral shoulder into the posterior shoulder  Pain description: sharp / shooting pain  Aggravating factors: lying on it  Relieving factors: changing positions   PRECAUTIONS: Shoulder per protocol   WEIGHT BEARING RESTRICTIONS No  FALLS:  Has patient fallen in last 6 months? Yes. 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;   PLOF: Independent  PATIENT GOALS   To regain functional use of her right arm   OBJECTIVE:     TODAY'S TREATMENT:   6/2 Manual: PROM flexion and ER with grade I/II AP mobs Shoulder ABCs supine 2x Supine flexion with wand 2x10 1lb Sidelying ER 2x10 Standing row 2x10 green Standing shoulder ext 2x10 green Finger ladder 2x3 reached 17   5/31: Manual: PROM flexion and ER with grade I/II distraction  Supine flexion with wand 2x10 1lb Sidelying ER 2x10 Shoulder ABCs supine 2x10 Reviewed/Refining HEP  5/25: MANUAL: STM to biceps, subscap, upper trap; AP GHJ mobs grade 3; passive flexion Supine flexion with wand Sidelying ER 3x10- cues for scap  retraction Sidelying abd, short arc Sidelying abd to 90 with small circles- also in supine flexion to 90 Wall walk with liftoff Upper trap stretch Chin tuck  5/23: Supine active flexion 0-90 with cues for scap retraction & depression Supine biceps curls with iso hold of scap retraction Supine shoulder flexion 75-120 with wand- cues for scap retraction MANUAL: passive flexion, STM to upper trap & deltoid Seated arm over pillow- scap retraciton, added ER Upper trap & levator stretch  5/19 Manual: gentle PROM into flexion and ER   Supine ER 2x20  Wand flexion 3x10   Side lying er 2x10   Pulleys 2 min flexion   Finger ladder x16 2x  Row red to neutral 2x10  Shoulder extension to neutral 2x10 red     PATIENT EDUCATION: Education details: exercise form/rationale Person educated: Patient Education method: Explanation, Demonstration, Tactile cues, and Verbal cues Education comprehension: verbalized understanding, returned demonstration, verbal cues required, tactile cues required, and needs further education   HOME EXERCISE PROGRAM: Access Code: [email or web address] URL: [email or web address]   ASSESSMENT:  CLINICAL IMPRESSION: The patient continues to progress well. She had no significant pain with treatment. Therapy will continue to advance the patient as tolerated. She was able to reach 17 on the finger ladder. We will advance weights and resistance per protocol.      OBJECTIVE IMPAIRMENTS decreased activity tolerance, decreased ROM, decreased strength, impaired UE functional use, and pain.   ACTIVITY LIMITATIONS cleaning, community activity, meal prep, laundry, and yard work.   PERSONAL FACTORS 1-2 comorbidities: anxiety, depression, bilateral knee replacement  are also affecting patient's functional outcome.    REHAB POTENTIAL: Good  CLINICAL DECISION MAKING: Evolving/moderate complexity decreased functional use of her left arm   EVALUATION COMPLEXITY:  Moderate   GOALS: Goals reviewed with patient? Yes  SHORT TERM GOALS: Target date: 07/02/2021   Patient will demonstrate full passive shoulder flexion  Baseline: Goal status: INITIAL  2.  Patient will be independent with basic HEP.  Baseline:  Goal status: INITIAL  3.  The patient will report <1/10 pain with ADL's below shoulder height  Baseline:  Goal status:  INITIAL  LONG TERM GOALS: Target date: 07/30/2021  (Remove Blue Hyperlink)  Patient will reach behind he back to L4 without pain in order to perform ADL's  Baseline:  Goal status: INITIAL  2.  Patient will lift 2 lb item to a shelf without pain  Baseline:  Goal status: INITIAL  3.  Patient will reach behind her head without pain  Baseline:  Goal status: INITIAL    PLAN: PT FREQUENCY: 2x/week  PT DURATION: 8 weeks  PLANNED INTERVENTIONS: Therapeutic exercises, Therapeutic activity, Neuromuscular re-education, Balance training, Gait training, Patient/Family education, Joint mobilization, Electrical stimulation, Cryotherapy, Moist heat, Taping, and Manual therapy  PLAN FOR NEXT SESSION: Progress resistance, AROM, and mid-range scapular/shoulder stability.   Carney Living, PT DPT  06/20/2021, 9:56 AM   Jolene Schimke SPT  06/19/2021  During this treatment session, the therapist was present, participating in and directing the treatment.

## 2021-06-20 ENCOUNTER — Encounter (HOSPITAL_BASED_OUTPATIENT_CLINIC_OR_DEPARTMENT_OTHER): Payer: Self-pay | Admitting: Physical Therapy

## 2021-06-23 ENCOUNTER — Ambulatory Visit (HOSPITAL_BASED_OUTPATIENT_CLINIC_OR_DEPARTMENT_OTHER): Payer: Medicare Other | Admitting: Physical Therapy

## 2021-06-26 ENCOUNTER — Ambulatory Visit (HOSPITAL_BASED_OUTPATIENT_CLINIC_OR_DEPARTMENT_OTHER): Payer: Medicare Other | Admitting: Physical Therapy

## 2021-06-26 ENCOUNTER — Encounter (HOSPITAL_BASED_OUTPATIENT_CLINIC_OR_DEPARTMENT_OTHER): Payer: Self-pay | Admitting: Physical Therapy

## 2021-06-26 DIAGNOSIS — M25611 Stiffness of right shoulder, not elsewhere classified: Secondary | ICD-10-CM

## 2021-06-26 DIAGNOSIS — G8929 Other chronic pain: Secondary | ICD-10-CM | POA: Diagnosis not present

## 2021-06-26 DIAGNOSIS — M25562 Pain in left knee: Secondary | ICD-10-CM | POA: Diagnosis not present

## 2021-06-26 DIAGNOSIS — M6281 Muscle weakness (generalized): Secondary | ICD-10-CM

## 2021-06-26 DIAGNOSIS — M25511 Pain in right shoulder: Secondary | ICD-10-CM | POA: Diagnosis not present

## 2021-06-26 NOTE — Therapy (Signed)
OUTPATIENT PHYSICAL THERAPY SHOULDER EVALUATION   Patient Name: Megan Nunez MRN: [id number] DOB:1945-11-09, 76 y.o., female Today's Date: 06/26/2021   PT End of Session - 06/26/21 1140     Visit Number 7    Number of Visits 16    Date for PT Re-Evaluation 07/30/21    PT Start Time 1103    PT Stop Time 1143    PT Time Calculation (min) 40 min    Activity Tolerance Patient tolerated treatment well    Behavior During Therapy [facility] for tasks assessed/performed                Past Medical History:  Diagnosis Date   Anxiety    Arthritis    Depression    Family history of adverse reaction to anesthesia 2012   Mother has cardiac  arrest after surgery   Hepatitis    high antibodies for hepatitis   Obesity    Past Surgical History:  Procedure Laterality Date   LAPAROSCOPIC TUBAL LIGATION  1985   TOTAL KNEE ARTHROPLASTY Right 04/15/2020   Procedure: TOTAL KNEE ARTHROPLASTY;  Surgeon: Marchia Bond, MD;  Location: [facility];  Service: Orthopedics;  Laterality: Right;   TOTAL KNEE ARTHROPLASTY Left 08/05/2020   Procedure: TOTAL KNEE ARTHROPLASTY;  Surgeon: Marchia Bond, MD;  Location: [facility];  Service: Orthopedics;  Laterality: Left;   TOTAL SHOULDER ARTHROPLASTY Right 03/31/2021   Procedure: TOTAL SHOULDER ARTHROPLASTY;  Surgeon: Marchia Bond, MD;  Location: [facility];  Service: Orthopedics;  Laterality: Right;   WISDOM TOOTH EXTRACTION     age 57   Patient Active Problem List   Diagnosis Date Noted   S/P shoulder replacement, right 03/31/2021   S/P TKR (total knee replacement), left 08/05/2020   Eczematous dermatitis of upper eyelids of both eyes 05/28/2020   S/P total knee replacement, right 04/15/2020   Osteopenia 04/10/2020   Major depressive disorder, recurrent episode, mild ([place]) 10/31/2017   ADHD, predominantly inattentive type 10/31/2017   Primary osteoarthritis of right knee 09/09/2016   Arthritis 07/25/2012   Tinnitus 07/25/2012   Mixed incontinence urge and  stress 07/25/2012     PCP: Dr Jill Alexanders    REFERRING PROVIDER: Dr Marchia Bond   REFERRING DIAG: Right Anatomical TSA    THERAPY DIAG:  No diagnosis found.     ONSET DATE: 03/31/2021   SUBJECTIVE:                                                                                                                                                                                       SUBJECTIVE STATEMENT: Patient reports good improvement around the home - has noticed continued  increases in ROM and strength during activity. No c/o pain or soreness today. PERTINENT HISTORY: Bilateral knee replacement; left shoulder OA; depression, anxiety;    PAIN:  Are you having pain? : NPRS scale: 0/10  Pain location: lateral shoulder into the posterior shoulder  Pain description: sharp / shooting pain  Aggravating factors: lying on it  Relieving factors: changing positions    PRECAUTIONS: Shoulder per protocol    WEIGHT BEARING RESTRICTIONS No   FALLS:  Has patient fallen in last 6 months? Yes. 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;    PLOF: Independent   PATIENT GOALS    To regain functional use of her right arm    OBJECTIVE:                TODAY'S TREATMENT:    6/9 Manual: PROM flexion and ER with grade I/II AP mobs Shoulder ABCs supine 2x, 1lb Sidelying ER 2x10, 1lb Flexion, wall on towel, 2x10 Standing row 2x10 blue band Standing shoulder ext 2x10 green Active flexion, scaption 2x8 in front of mirror     6/2 Manual: PROM flexion and ER with grade I/II AP mobs Shoulder ABCs supine 2x Supine flexion with wand 2x10 1lb Sidelying ER 2x10 Standing row 2x10 green Standing shoulder ext 2x10 green Finger ladder 2x3 reached 17    5/31: Manual: PROM flexion and ER with grade I/II distraction  Supine flexion with wand 2x10 1lb Sidelying ER 2x10 Shoulder ABCs supine  2x10 Reviewed/Refining HEP   5/25: MANUAL: STM to biceps, subscap, upper trap; AP GHJ mobs grade 3; passive flexion Supine flexion with wand Sidelying ER 3x10- cues for scap retraction Sidelying abd, short arc Sidelying abd to 90 with small circles- also in supine flexion to 90 Wall walk with liftoff Upper trap stretch Chin tuck   5/23: Supine active flexion 0-90 with cues for scap retraction & depression Supine biceps curls with iso hold of scap retraction Supine shoulder flexion 75-120 with wand- cues for scap retraction MANUAL: passive flexion, STM to upper trap & deltoid Seated arm over pillow- scap retraciton, added ER Upper trap & levator stretch   5/19 Manual: gentle PROM into flexion and ER    Supine ER 2x20  Wand flexion 3x10    Side lying er 2x10    Pulleys 2 min flexion    Finger ladder x16 2x  Row red to neutral 2x10  Shoulder extension to neutral 2x10 red         PATIENT EDUCATION: Education details: exercise form/rationale Person educated: Patient Education method: Explanation, Demonstration, Tactile cues, and Verbal cues Education comprehension: verbalized understanding, returned demonstration, verbal cues required, tactile cues required, and needs further education     HOME EXERCISE PROGRAM: Access Code: [email or web address] URL: [email or web address]     ASSESSMENT:   CLINICAL IMPRESSION: Patient continues to make good progress - shows good response to manual treatment and able to move through end ranges with new strength exercise today. Patient will continue to progress with resistance and activities per protocol.       OBJECTIVE IMPAIRMENTS decreased activity tolerance, decreased ROM, decreased strength, impaired UE functional use, and pain.    ACTIVITY LIMITATIONS cleaning, community activity, meal prep, laundry, and yard work.    PERSONAL FACTORS 1-2 comorbidities: anxiety, depression, bilateral knee replacement  are also affecting  patient's functional outcome.      REHAB POTENTIAL: Good   CLINICAL  DECISION MAKING: Evolving/moderate complexity decreased functional use of her left arm    EVALUATION COMPLEXITY: Moderate     GOALS: Goals reviewed with patient? Yes   SHORT TERM GOALS: Target date: 07/02/2021   Patient will demonstrate full passive shoulder flexion  Baseline: Goal status: INITIAL   2.  Patient will be independent with basic HEP.  Baseline:  Goal status: INITIAL   3.  The patient will report <1/10 pain with ADL's below shoulder height  Baseline:  Goal status: INITIAL   LONG TERM GOALS: Target date: 07/30/2021  (Remove Blue Hyperlink)   Patient will reach behind he back to L4 without pain in order to perform ADL's  Baseline:  Goal status: INITIAL   2.  Patient will lift 2 lb item to a shelf without pain  Baseline:  Goal status: INITIAL   3.  Patient will reach behind her head without pain  Baseline:  Goal status: INITIAL       PLAN: PT FREQUENCY: 2x/week   PT DURATION: 8 weeks   PLANNED INTERVENTIONS: Therapeutic exercises, Therapeutic activity, Neuromuscular re-education, Balance training, Gait training, Patient/Family education, Joint mobilization, Electrical stimulation, Cryotherapy, Moist heat, Taping, and Manual therapy   PLAN FOR NEXT SESSION: Progress resistance, AROM, and mid-range scapular/shoulder stability.     Megan Nunez, Student-PT DPT  06/26/2021, 3:58 PM    Megan Nunez SPT  06/19/2021   During this treatment session, the therapist was present, participating in and directing the treatment.    Megan Nunez, PT DPT  06/26/2021, 4:24 PM   Megan Nunez SPT  06/19/2021  During this treatment session, the therapist was present, participating in and directing the treatment.

## 2021-06-26 NOTE — Therapy (Incomplete)
OUTPATIENT PHYSICAL THERAPY SHOULDER EVALUATION   Patient Name: Megan Nunez MRN: [id number] DOB:02-13-1945, 76 y.o., female Today's Date: 06/26/2021   PT End of Session - 06/26/21 1140     Visit Number 7    Number of Visits 16    Date for PT Re-Evaluation 07/30/21    PT Start Time 1103    PT Stop Time 1140    PT Time Calculation (min) 37 min    Activity Tolerance Patient tolerated treatment well    Behavior During Therapy WFL for tasks assessed/performed                Past Medical History:  Diagnosis Date   Anxiety    Arthritis    Depression    Family history of adverse reaction to anesthesia 2012   Mother has cardiac  arrest after surgery   Hepatitis    high antibodies for hepatitis   Obesity    Past Surgical History:  Procedure Laterality Date   LAPAROSCOPIC TUBAL LIGATION  1985   TOTAL KNEE ARTHROPLASTY Right 04/15/2020   Procedure: TOTAL KNEE ARTHROPLASTY;  Surgeon: Marchia Bond, MD;  Location: [facility];  Service: Orthopedics;  Laterality: Right;   TOTAL KNEE ARTHROPLASTY Left 08/05/2020   Procedure: TOTAL KNEE ARTHROPLASTY;  Surgeon: Marchia Bond, MD;  Location: [facility];  Service: Orthopedics;  Laterality: Left;   TOTAL SHOULDER ARTHROPLASTY Right 03/31/2021   Procedure: TOTAL SHOULDER ARTHROPLASTY;  Surgeon: Marchia Bond, MD;  Location: [facility];  Service: Orthopedics;  Laterality: Right;   WISDOM TOOTH EXTRACTION     age 67   Patient Active Problem List   Diagnosis Date Noted   S/P shoulder replacement, right 03/31/2021   S/P TKR (total knee replacement), left 08/05/2020   Eczematous dermatitis of upper eyelids of both eyes 05/28/2020   S/P total knee replacement, right 04/15/2020   Osteopenia 04/10/2020   Major depressive disorder, recurrent episode, mild ([place]) 10/31/2017   ADHD, predominantly inattentive type 10/31/2017   Primary osteoarthritis of right knee 09/09/2016   Arthritis 07/25/2012   Tinnitus 07/25/2012   Mixed incontinence urge and  stress 07/25/2012    PCP: Dr Jill Alexanders   REFERRING PROVIDER: Dr Marchia Bond  REFERRING DIAG: Right Anatomical TSA   THERAPY DIAG:  No diagnosis found.   ONSET DATE: 03/31/2021  SUBJECTIVE:                                                                                                                                                                                      SUBJECTIVE STATEMENT: Patient reports good improvement around the home - has noticed continued increases in ROM and strength during activity. No  c/o pain or soreness today. PERTINENT HISTORY: Bilateral knee replacement; left shoulder OA; depression, anxiety;   PAIN:  Are you having pain? : NPRS scale: 0/10  Pain location: lateral shoulder into the posterior shoulder  Pain description: sharp / shooting pain  Aggravating factors: lying on it  Relieving factors: changing positions   PRECAUTIONS: Shoulder per protocol   WEIGHT BEARING RESTRICTIONS No  FALLS:  Has patient fallen in last 6 months? Yes. 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;   PLOF: Independent  PATIENT GOALS   To regain functional use of her right arm   OBJECTIVE:     TODAY'S TREATMENT:   6/9 Manual: PROM flexion and ER with grade I/II AP mobs Shoulder ABCs supine 2x, 1lb Sidelying ER 2x10, 1lb Flexion, wall on towel, 2x10 Standing row 2x10 blue band Standing shoulder ext 2x10 green Active flexion, scaption 2x8 in front of mirror   6/2 Manual: PROM flexion and ER with grade I/II AP mobs Shoulder ABCs supine 2x Supine flexion with wand 2x10 1lb Sidelying ER 2x10 Standing row 2x10 green Standing shoulder ext 2x10 green Finger ladder 2x3 reached 17   5/31: Manual: PROM flexion and ER with grade I/II distraction  Supine flexion with wand 2x10 1lb Sidelying ER 2x10 Shoulder ABCs supine 2x10 Reviewed/Refining HEP  5/25: MANUAL: STM to  biceps, subscap, upper trap; AP GHJ mobs grade 3; passive flexion Supine flexion with wand Sidelying ER 3x10- cues for scap retraction Sidelying abd, short arc Sidelying abd to 90 with small circles- also in supine flexion to 90 Wall walk with liftoff Upper trap stretch Chin tuck  5/23: Supine active flexion 0-90 with cues for scap retraction & depression Supine biceps curls with iso hold of scap retraction Supine shoulder flexion 75-120 with wand- cues for scap retraction MANUAL: passive flexion, STM to upper trap & deltoid Seated arm over pillow- scap retraciton, added ER Upper trap & levator stretch  5/19 Manual: gentle PROM into flexion and ER   Supine ER 2x20  Wand flexion 3x10   Side lying er 2x10   Pulleys 2 min flexion   Finger ladder x16 2x  Row red to neutral 2x10  Shoulder extension to neutral 2x10 red     PATIENT EDUCATION: Education details: exercise form/rationale Person educated: Patient Education method: Explanation, Demonstration, Tactile cues, and Verbal cues Education comprehension: verbalized understanding, returned demonstration, verbal cues required, tactile cues required, and needs further education   HOME EXERCISE PROGRAM: Access Code: [email or web address] URL: [email or web address]   ASSESSMENT:  CLINICAL IMPRESSION: Patient continues to make good progress - shows good response to manual treatment and able to move through end ranges with new strength exercise today. Patient will continue to progress with resistance and activities per protocol.     OBJECTIVE IMPAIRMENTS decreased activity tolerance, decreased ROM, decreased strength, impaired UE functional use, and pain.   ACTIVITY LIMITATIONS cleaning, community activity, meal prep, laundry, and yard work.   PERSONAL FACTORS 1-2 comorbidities: anxiety, depression, bilateral knee replacement  are also affecting patient's functional outcome.    REHAB POTENTIAL: Good  CLINICAL  DECISION MAKING: Evolving/moderate complexity decreased functional use of her left arm   EVALUATION COMPLEXITY: Moderate   GOALS: Goals reviewed with patient? Yes  SHORT TERM GOALS: Target date: 07/02/2021   Patient will demonstrate full passive shoulder flexion  Baseline: Goal status: INITIAL  2.  Patient will be independent with basic HEP.  Baseline:  Goal status: INITIAL  3.  The patient will report <1/10 pain with ADL's below shoulder height  Baseline:  Goal status: INITIAL  LONG TERM GOALS: Target date: 07/30/2021  (Remove Blue Hyperlink)  Patient will reach behind he back to L4 without pain in order to perform ADL's  Baseline:  Goal status: INITIAL  2.  Patient will lift 2 lb item to a shelf without pain  Baseline:  Goal status: INITIAL  3.  Patient will reach behind her head without pain  Baseline:  Goal status: INITIAL    PLAN: PT FREQUENCY: 2x/week  PT DURATION: 8 weeks  PLANNED INTERVENTIONS: Therapeutic exercises, Therapeutic activity, Neuromuscular re-education, Balance training, Gait training, Patient/Family education, Joint mobilization, Electrical stimulation, Cryotherapy, Moist heat, Taping, and Manual therapy  PLAN FOR NEXT SESSION: Progress resistance, AROM, and mid-range scapular/shoulder stability.   Lenell Antu, Student-PT DPT  06/26/2021, 3:58 PM   Mathew Arden Axon SPT  06/19/2021  During this treatment session, the therapist was present, participating in and directing the treatment.

## 2021-06-28 ENCOUNTER — Encounter (HOSPITAL_BASED_OUTPATIENT_CLINIC_OR_DEPARTMENT_OTHER): Payer: Self-pay | Admitting: Physical Therapy

## 2021-06-29 ENCOUNTER — Encounter (HOSPITAL_BASED_OUTPATIENT_CLINIC_OR_DEPARTMENT_OTHER): Payer: Self-pay | Admitting: Physical Therapy

## 2021-06-30 ENCOUNTER — Encounter (HOSPITAL_BASED_OUTPATIENT_CLINIC_OR_DEPARTMENT_OTHER): Payer: Self-pay | Admitting: Physical Therapy

## 2021-06-30 ENCOUNTER — Ambulatory Visit (HOSPITAL_BASED_OUTPATIENT_CLINIC_OR_DEPARTMENT_OTHER): Payer: Medicare Other | Admitting: Physical Therapy

## 2021-06-30 DIAGNOSIS — M25611 Stiffness of right shoulder, not elsewhere classified: Secondary | ICD-10-CM

## 2021-06-30 DIAGNOSIS — G8929 Other chronic pain: Secondary | ICD-10-CM | POA: Diagnosis not present

## 2021-06-30 DIAGNOSIS — M25511 Pain in right shoulder: Secondary | ICD-10-CM | POA: Diagnosis not present

## 2021-06-30 DIAGNOSIS — M6281 Muscle weakness (generalized): Secondary | ICD-10-CM

## 2021-06-30 DIAGNOSIS — M25562 Pain in left knee: Secondary | ICD-10-CM | POA: Diagnosis not present

## 2021-06-30 NOTE — Therapy (Signed)
OUTPATIENT PHYSICAL THERAPY SHOULDER EVALUATION   Patient Name: Megan Nunez MRN: [id number] DOB:[birth year], 76 y.o., female Today's Date: 06/30/2021        Past Medical History:  Diagnosis Date   Anxiety    Arthritis    Depression    Family history of adverse reaction to anesthesia 2012   Mother has cardiac  arrest after surgery   Hepatitis    high antibodies for hepatitis   Obesity    Past Surgical History:  Procedure Laterality Date   LAPAROSCOPIC TUBAL LIGATION  1985   TOTAL KNEE ARTHROPLASTY Right 04/15/2020   Procedure: TOTAL KNEE ARTHROPLASTY;  Surgeon: Marchia Bond, MD;  Location: [facility];  Service: Orthopedics;  Laterality: Right;   TOTAL KNEE ARTHROPLASTY Left 08/05/2020   Procedure: TOTAL KNEE ARTHROPLASTY;  Surgeon: Marchia Bond, MD;  Location: [facility];  Service: Orthopedics;  Laterality: Left;   TOTAL SHOULDER ARTHROPLASTY Right 03/31/2021   Procedure: TOTAL SHOULDER ARTHROPLASTY;  Surgeon: Marchia Bond, MD;  Location[facility];  Service: Orthopedics;  Laterality: Right;   WISDOM TOOTH EXTRACTION     age 36   Patient Active Problem List   Diagnosis Date Noted   S/P shoulder replacement, right 03/31/2021   S/P TKR (total knee replacement), left 08/05/2020   Eczematous dermatitis of upper eyelids of both eyes 05/28/2020   S/P total knee replacement, right 04/15/2020   Osteopenia 04/10/2020   Major depressive disorder, recurrent episode, mild ([place]) 10/31/2017   ADHD, predominantly inattentive type 10/31/2017   Primary osteoarthritis of right knee 09/09/2016   Arthritis 07/25/2012   Tinnitus 07/25/2012   Mixed incontinence urge and stress 07/25/2012     PCP: Dr Jill Alexanders    REFERRING PROVIDER: Dr Marchia Bond   REFERRING DIAG: Right Anatomical TSA    THERAPY DIAG:  No diagnosis found.     ONSET DATE: 03/31/2021   SUBJECTIVE:                                                                                                                                                                                        SUBJECTIVE STATEMENT: Patient reports no pain currently, has felt some more frequent twinges over the weekend but attributes to increase in activity. Pt happy with progression so far and has noticed tasks becoming easier with R UE around the home. PERTINENT HISTORY: Bilateral knee replacement; left shoulder OA; depression, anxiety;    PAIN:  Are you having pain? : NPRS scale: 0/10  Pain location: lateral shoulder into the posterior shoulder  Pain description: sharp / shooting pain  Aggravating factors: lying on it  Relieving factors: changing positions    PRECAUTIONS:  Shoulder per protocol    WEIGHT BEARING RESTRICTIONS No   FALLS:  Has patient fallen in last 6 months? Yes. 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 Pt achieved lvl 20 Standing row 2x15 blue band Standing shoulder ext 2x10 blue band Scap star on wall - x8, red band   6/9 Manual: PROM flexion and ER with grade I/II AP mobs Shoulder ABCs supine 2x, 1lb Sidelying ER 2x10, 1lb Flexion, wall on towel, 2x10 Standing row 2x10 blue band Standing shoulder ext 2x10 green Active flexion, scaption 2x8 in front of mirror     6/2 Manual: PROM flexion and ER with grade I/II AP mobs Shoulder ABCs supine 2x Supine flexion with wand 2x10 1lb Sidelying ER 2x10 Standing row 2x10 green Standing shoulder ext 2x10 green Finger ladder 2x3 reached 17    5/31: Manual: PROM flexion and ER with grade I/II distraction  Supine flexion with wand  2x10 1lb Sidelying ER 2x10 Shoulder ABCs supine 2x10 Reviewed/Refining HEP   5/25: MANUAL: STM to biceps, subscap, upper trap; AP GHJ mobs grade 3; passive flexion Supine flexion with wand Sidelying ER 3x10- cues for scap retraction Sidelying abd, short arc Sidelying abd to 90 with small circles- also in supine flexion to 90 Wall walk with liftoff Upper trap stretch Chin tuck   5/23: Supine active flexion 0-90 with cues for scap retraction & depression Supine biceps curls with iso hold of scap retraction Supine shoulder flexion 75-120 with wand- cues for scap retraction MANUAL: passive flexion, STM to upper trap & deltoid Seated arm over pillow- scap retraciton, added ER Upper trap & levator stretch    PATIENT EDUCATION: Education details: exercise form/rationale Person educated: Patient Education method: Explanation, Demonstration, Tactile cues, and Verbal cues Education comprehension: verbalized understanding, returned demonstration, verbal cues required, tactile cues required, and needs further education     HOME EXERCISE PROGRAM: Access Code: [email or web address] URL: [email or web address]     ASSESSMENT:   CLINICAL IMPRESSION: Patient continues to make excellent progress - both passive and active ROM continue to improve to near functional/full today. Pt tolerates increase in resistance for exercise very well - no pain but mild muscle fatigue noted. Pt displays good scapular control and stability as well. Pt will continue to benefit from skilled therapy for further improvements with functional activity.      OBJECTIVE IMPAIRMENTS decreased activity tolerance, decreased ROM, decreased strength, impaired UE functional use, and pain.    ACTIVITY LIMITATIONS cleaning, community activity, meal prep, laundry, and yard work.    PERSONAL FACTORS 1-2 comorbidities: anxiety, depression, bilateral knee replacement  are also affecting patient's functional outcome.       REHAB POTENTIAL: Good   CLINICAL DECISION MAKING: Evolving/moderate complexity decreased functional use of her left arm    EVALUATION COMPLEXITY: Moderate     GOALS: Goals reviewed with patient? Yes   SHORT TERM GOALS: Target date: 07/02/2021   Patient will demonstrate full passive shoulder flexion  Baseline:  Goal status: INITIAL   2.  Patient will be independent with basic HEP.  Baseline:  Goal status: INITIAL   3.  The patient will report <1/10 pain with ADL's below shoulder height  Baseline:  Goal status: INITIAL   LONG TERM GOALS: Target date: 07/30/2021     Patient will reach behind he back to L4 without pain in order to perform ADL's  Baseline:  Goal status: INITIAL   2.  Patient will lift 2 lb item to a shelf without pain  Baseline:  Goal status: INITIAL   3.  Patient will reach behind her head without pain  Baseline:  Goal status: INITIAL       PLAN: PT FREQUENCY: 2x/week   PT DURATION: 8 weeks   PLANNED INTERVENTIONS: Therapeutic exercises, Therapeutic activity, Neuromuscular re-education, Balance training, Gait training, Patient/Family education, Joint mobilization, Electrical stimulation, Cryotherapy, Moist heat, Taping, and Manual therapy   PLAN FOR NEXT SESSION: Progress ROM as tolerated, begin deltoid+rotator cuff strength and progress scapular strengthening/stability per protocol.     Juliane [place] SPT  06/19/2021   During this treatment session, the therapist was present, participating in and directing the treatment.    Carney Living, PT DPT  06/30/2021, 11:02 AM

## 2021-07-02 ENCOUNTER — Encounter (HOSPITAL_BASED_OUTPATIENT_CLINIC_OR_DEPARTMENT_OTHER): Payer: Self-pay | Admitting: Physical Therapy

## 2021-07-02 ENCOUNTER — Ambulatory Visit (HOSPITAL_BASED_OUTPATIENT_CLINIC_OR_DEPARTMENT_OTHER): Payer: Medicare Other | Admitting: Physical Therapy

## 2021-07-02 DIAGNOSIS — M25511 Pain in right shoulder: Secondary | ICD-10-CM | POA: Diagnosis not present

## 2021-07-02 DIAGNOSIS — G8929 Other chronic pain: Secondary | ICD-10-CM | POA: Diagnosis not present

## 2021-07-02 DIAGNOSIS — M25562 Pain in left knee: Secondary | ICD-10-CM | POA: Diagnosis not present

## 2021-07-02 DIAGNOSIS — M25611 Stiffness of right shoulder, not elsewhere classified: Secondary | ICD-10-CM | POA: Diagnosis not present

## 2021-07-02 DIAGNOSIS — M6281 Muscle weakness (generalized): Secondary | ICD-10-CM | POA: Diagnosis not present

## 2021-07-02 NOTE — Therapy (Signed)
OUTPATIENT PHYSICAL THERAPY SHOULDER EVALUATION   Patient Name: Megan Nunez MRN: [id number] DOB:08/17/1945, 76 y.o., female Today's Date: 07/02/2021        Past Medical History:  Diagnosis Date   Anxiety    Arthritis    Depression    Family history of adverse reaction to anesthesia 2012   Mother has cardiac  arrest after surgery   Hepatitis    high antibodies for hepatitis   Obesity    Past Surgical History:  Procedure Laterality Date   LAPAROSCOPIC TUBAL LIGATION  1985   TOTAL KNEE ARTHROPLASTY Right 04/15/2020   Procedure: TOTAL KNEE ARTHROPLASTY;  Surgeon: Teryl Lucy, MD;  Location: [facility];  Service: Orthopedics;  Laterality: Right;   TOTAL KNEE ARTHROPLASTY Left 08/05/2020   Procedure: TOTAL KNEE ARTHROPLASTY;  Surgeon: Teryl Lucy, MD;  Location: [facility];  Service: Orthopedics;  Laterality: Left;   TOTAL SHOULDER ARTHROPLASTY Right 03/31/2021   Procedure: TOTAL SHOULDER ARTHROPLASTY;  Surgeon: Teryl Lucy, MD;  Location: [facility];  Service: Orthopedics;  Laterality: Right;   WISDOM TOOTH EXTRACTION     age 43   Patient Active Problem List   Diagnosis Date Noted   S/P shoulder replacement, right 03/31/2021   S/P TKR (total knee replacement), left 08/05/2020   Eczematous dermatitis of upper eyelids of both eyes 05/28/2020   S/P total knee replacement, right 04/15/2020   Osteopenia 04/10/2020   Major depressive disorder, recurrent episode, mild (HCC) 10/31/2017   ADHD, predominantly inattentive type 10/31/2017   Primary osteoarthritis of right knee 09/09/2016   Arthritis 07/25/2012   Tinnitus 07/25/2012   Mixed incontinence urge and stress 07/25/2012     PCP: Dr Sharlot Gowda    REFERRING PROVIDER: Dr Teryl Lucy   REFERRING DIAG: Right Anatomical TSA    THERAPY DIAG:  No diagnosis found.     ONSET DATE: 03/31/2021   SUBJECTIVE:                                                                                                                                                                                        SUBJECTIVE STATEMENT: Pt has been feeling great overall, moderate soreness following previous session that lasted no more than 24 hours. No soreness/fatigue today, pt able to do more functional reaching around home.    PERTINENT HISTORY: Bilateral knee replacement; left shoulder OA; depression, anxiety;    PAIN:  Are you having pain? : NPRS scale: 0/10  Pain location: lateral shoulder into the posterior shoulder  Pain description: sharp / shooting pain  Aggravating factors: lying on it  Relieving factors: changing positions    PRECAUTIONS: Shoulder per protocol  WEIGHT BEARING RESTRICTIONS No   FALLS:  Has patient fallen in last 6 months? Yes. 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 Pt achieved lvl 20 Standing row 2x15 blue band Standing shoulder ext 2x10 blue band Scap star on wall - x8, red band   6/9 Manual: PROM flexion and ER with grade I/II AP mobs Shoulder ABCs supine 2x, 1lb Sidelying ER 2x10, 1lb Flexion, wall on towel, 2x10 Standing row 2x10 blue band Standing shoulder ext 2x10 green Active flexion, scaption 2x8 in front of mirror     6/2 Manual: PROM flexion and ER with grade I/II AP mobs Shoulder ABCs supine 2x Supine flexion  with wand 2x10 1lb Sidelying ER 2x10 Standing row 2x10 green Standing shoulder ext 2x10 green Finger ladder 2x3 reached 17    5/31: Manual: PROM flexion and ER with grade I/II distraction  Supine flexion with wand 2x10 1lb Sidelying ER 2x10 Shoulder ABCs supine 2x10 Reviewed/Refining HEP    PATIENT EDUCATION: Education details: exercise form/rationale Person educated: Patient Education method: Explanation, Demonstration, Tactile cues, and Verbal cues Education comprehension: verbalized understanding, returned demonstration, verbal cues required, tactile cues required, and needs further education     HOME EXERCISE PROGRAM: Access Code: [email or web address] URL: [email or web address]     ASSESSMENT:   CLINICAL IMPRESSION: Patient continues to make great progress - ER PROM is now full, flexion approx 160deg; pt able to move AROM through approx 75% PROM. Pt tolerates exercise well today with mild-mod fatigue noted after session. Pt continues to show good scapular control. Pt will continue to benefit from skilled therapy to improve end range motion, strength, stability, and tolerance to functional activity.      OBJECTIVE IMPAIRMENTS decreased activity tolerance, decreased ROM, decreased strength, impaired UE functional use, and pain.    ACTIVITY LIMITATIONS cleaning, community activity, meal prep, laundry, and yard work.    PERSONAL FACTORS 1-2 comorbidities: anxiety, depression, bilateral knee replacement  are also affecting patient's functional outcome.      REHAB POTENTIAL: Good   CLINICAL DECISION MAKING: Evolving/moderate complexity decreased functional use of her left arm    EVALUATION COMPLEXITY: Moderate     GOALS: Goals reviewed with patient? Yes   SHORT TERM GOALS: Target date: 07/02/2021   Patient will demonstrate full passive shoulder flexion  Baseline: Goal status: INITIAL   2.  Patient will be independent with basic HEP.  Baseline:  Goal status:  INITIAL   3.  The patient will report <1/10 pain with ADL's below shoulder height  Baseline:  Goal status: INITIAL   LONG TERM GOALS: Target date: 07/30/2021     Patient will reach behind he back to L4 without pain in order to perform ADL's  Baseline:  Goal status: INITIAL   2.  Patient will lift 2 lb item to a shelf without pain  Baseline:  Goal status: INITIAL   3.  Patient will reach behind her head without pain  Baseline:  Goal status: INITIAL       PLAN: PT FREQUENCY: 2x/week   PT DURATION: 8 weeks   PLANNED INTERVENTIONS: Therapeutic exercises, Therapeutic activity, Neuromuscular re-education, Balance training, Gait training, Patient/Family education, Joint mobilization, Electrical stimulation, Cryotherapy, Moist heat, Taping, and Manual therapy   PLAN FOR NEXT SESSION: Progress flexion AROM, continue deltoid+rotator cuff strength and progress scapular strengthening/stability per protocol.   Carolyne Littles PT DPT  06/19/2021  Jolene Schimke SPT  06/19/2021   During this treatment session, the therapist was present, participating in and directing the treatment.    Carney Living, PT DPT  07/02/2021, 11:01 AM

## 2021-07-13 ENCOUNTER — Encounter (HOSPITAL_BASED_OUTPATIENT_CLINIC_OR_DEPARTMENT_OTHER): Payer: Self-pay | Admitting: Physical Therapy

## 2021-07-13 ENCOUNTER — Ambulatory Visit (HOSPITAL_BASED_OUTPATIENT_CLINIC_OR_DEPARTMENT_OTHER): Payer: Medicare Other | Admitting: Physical Therapy

## 2021-07-13 DIAGNOSIS — M6281 Muscle weakness (generalized): Secondary | ICD-10-CM

## 2021-07-13 DIAGNOSIS — G8929 Other chronic pain: Secondary | ICD-10-CM | POA: Diagnosis not present

## 2021-07-13 DIAGNOSIS — M25611 Stiffness of right shoulder, not elsewhere classified: Secondary | ICD-10-CM

## 2021-07-13 DIAGNOSIS — M25511 Pain in right shoulder: Secondary | ICD-10-CM | POA: Diagnosis not present

## 2021-07-13 DIAGNOSIS — M25562 Pain in left knee: Secondary | ICD-10-CM | POA: Diagnosis not present

## 2021-07-13 NOTE — Therapy (Addendum)
OUTPATIENT PHYSICAL THERAPY SHOULDER progress note    Patient Name: Megan Nunez MRN: [id number] DOB:03-Jul-1945, 76 y.o., female Today's Date: 07/13/2021  Progress Note Reporting Period 06/03/2021 to 07/13/2021  See note below for Objective Data and Assessment of Progress/Goals.       PT End of Session - 07/13/21 1417     Visit Number 10    Number of Visits 16    Date for PT Re-Evaluation 07/30/21    PT Start Time 1345    PT Stop Time 1417    PT Time Calculation (min) 32 min    Activity Tolerance Patient tolerated treatment well    Behavior During Therapy WFL for tasks assessed/performed                 Past Medical History:  Diagnosis Date   Anxiety    Arthritis    Depression    Family history of adverse reaction to anesthesia 2012   Mother has cardiac  arrest after surgery   Hepatitis    high antibodies for hepatitis   Obesity    Past Surgical History:  Procedure Laterality Date   LAPAROSCOPIC TUBAL LIGATION  1985   TOTAL KNEE ARTHROPLASTY Right 04/15/2020   Procedure: TOTAL KNEE ARTHROPLASTY;  Surgeon: Teryl Lucy, MD;  Location: [facility];  Service: Orthopedics;  Laterality: Right;   TOTAL KNEE ARTHROPLASTY Left 08/05/2020   Procedure: TOTAL KNEE ARTHROPLASTY;  Surgeon: Teryl Lucy, MD;  Location: [facility];  Service: Orthopedics;  Laterality: Left;   TOTAL SHOULDER ARTHROPLASTY Right 03/31/2021   Procedure: TOTAL SHOULDER ARTHROPLASTY;  Surgeon: Teryl Lucy, MD;  Location: [facility];  Service: Orthopedics;  Laterality: Right;   WISDOM TOOTH EXTRACTION     age 46   Patient Active Problem List   Diagnosis Date Noted   S/P shoulder replacement, right 03/31/2021   S/P TKR (total knee replacement), left 08/05/2020   Eczematous dermatitis of upper eyelids of both eyes 05/28/2020   S/P total knee replacement, right 04/15/2020   Osteopenia 04/10/2020   Major depressive disorder, recurrent episode, mild (HCC) 10/31/2017   ADHD, predominantly inattentive type  10/31/2017   Primary osteoarthritis of right knee 09/09/2016   Arthritis 07/25/2012   Tinnitus 07/25/2012   Mixed incontinence urge and stress 07/25/2012  PCP: Dr Sharlot Gowda    REFERRING PROVIDER: Dr Teryl Lucy   REFERRING DIAG: Right Anatomical TSA    THERAPY DIAG:  No diagnosis found.     ONSET DATE: 03/31/2021   SUBJECTIVE:  SUBJECTIVE STATEMENT: Pt feels good overall - no pain, mild muscle soreness. She has had steady improvement in function around the home over the past week. She has also noticed her L shoulder has been feeling better and stronger as well.     PERTINENT HISTORY: Bilateral knee replacement; left shoulder OA; depression, anxiety;    PAIN:  Are you having pain? : NPRS scale: 0/10  Pain location: lateral shoulder into the posterior shoulder  Pain description: sharp / shooting pain  Aggravating factors: lying on it  Relieving factors: changing positions    PRECAUTIONS: Shoulder per protocol    WEIGHT BEARING RESTRICTIONS No   FALLS:  Has patient fallen in last 6 months? Yes. 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;    PLOF: Independent   PATIENT GOALS    To regain functional use of her right arm    OBJECTIVE:   full PROM into flexion and abduction per  visual inspection; mild limitations in ER  Strength progressing well per patient report              TODAY'S TREATMENT:    6/26 Manual: PROM flexion and ER with grade III AP and inf mobs Supine flexion, 2x10 1lb Supine serratus punch, 2x10 1lb Sidelying ER 2x12, 2lb Flexion/scaption, wall on towel, 2x10 each Standing row 2x15 blue band Standing shoulder ext 2x10 blue band   6/15 Manual: PROM flexion and ER with grade III AP  and inf mobs Wand flexion, 2x10 2lb Sidelying ER 2x10, 2lb Serratus punch, 2x10 Flexion/scaption, wall on towel, 2x10 each Standing row 2x15 blue band Standing shoulder ext 2x10 blue band   6/13 Manual: PROM flexion and ER with grade III AP and inf mobs Shoulder ABCs supine 2x, 2lb Sidelying ER 2x10, 2lb Flexion/scaption, wall on towel, 2x10 each Finger ladder flexion/scaption, 2x10 each. Pt achieved lvl 20 Standing row 2x15 blue band Standing shoulder ext 2x10 blue band Scap star on wall - x8, red band       PATIENT EDUCATION: Education details: exercise form/rationale Person educated: Patient Education method: Explanation, Demonstration, Tactile cues, and Verbal cues Education comprehension: verbalized understanding, returned demonstration, verbal cues required, tactile cues required, and needs further education     HOME EXERCISE PROGRAM: Access Code: [email or web address] URL: [email or web address]     ASSESSMENT:   CLINICAL IMPRESSION: Pt continues steady progress - she has near full ER and flexion range secondary to some muscle guarding and capsular tightness. Her active range continues to improve, especially with resistance. She shows good scapular control and is able to limit elevation when cued. She has some mild fatigue at end of session. She will continue to benefit from skilled therapy to maximize functionality. The patient would benefit from further skilled therapy 2w6. We will perform a formal re-eval after her return from her trip.      OBJECTIVE IMPAIRMENTS decreased activity tolerance, decreased ROM, decreased strength, impaired UE functional use, and pain.    ACTIVITY LIMITATIONS cleaning, community activity, meal prep, laundry, and yard work.    PERSONAL FACTORS 1-2 comorbidities: anxiety, depression, bilateral knee replacement  are also affecting patient's functional outcome.      REHAB POTENTIAL: Good   CLINICAL DECISION MAKING: Evolving/moderate  complexity decreased functional use of her left arm    EVALUATION COMPLEXITY: Moderate     GOALS: Goals reviewed with patient? Yes   SHORT TERM GOALS: Target date: 07/02/2021  assessed on 6/26  Patient will demonstrate full passive shoulder flexion  Baseline: Goal status: achieved full passive ER    2.  Patient will be independent with basic HEP.  Baseline:  Goal status: performing initial HEP    3.  The patient will report <1/10 pain with ADL's below shoulder height  Baseline:  Goal status: very little pain with any activity Achieved    LONG TERM GOALS: Target date: 07/30/2021     Patient will reach behind he back to L4 without pain in order to perform ADL's  Baseline:  Goal status: INITIAL   2.  Patient will lift 2 lb item to a shelf without pain  Baseline:  Goal status: INITIAL   3.  Patient will reach behind her head without pain  Baseline:  Goal status: INITIAL       PLAN: PT FREQUENCY: 2x/week   PT DURATION: 8 weeks   PLANNED INTERVENTIONS: Therapeutic exercises, Therapeutic activity, Neuromuscular re-education, Balance training, Gait training, Patient/Family education, Joint mobilization, Electrical stimulation, Cryotherapy, Moist heat, Taping, and Manual therapy   PLAN FOR NEXT SESSION: Progress flexion AROM, continue deltoid+rotator cuff strength and progress scapular strengthening/stability per protocol.     Carolyne Littles PT DPT  06/19/2021  Jolene Schimke SPT  06/19/2021   During this treatment session, the therapist was present, participating in and directing the treatment.    Carney Living, PT DPT  07/13/2021, 2:27 PM

## 2021-07-14 ENCOUNTER — Encounter: Payer: Self-pay | Admitting: Psychiatry

## 2021-07-14 ENCOUNTER — Ambulatory Visit: Payer: Medicare Other | Admitting: Psychiatry

## 2021-07-14 VITALS — BP 129/76 | HR 71

## 2021-07-14 DIAGNOSIS — F900 Attention-deficit hyperactivity disorder, predominantly inattentive type: Secondary | ICD-10-CM | POA: Diagnosis not present

## 2021-07-14 DIAGNOSIS — F3341 Major depressive disorder, recurrent, in partial remission: Secondary | ICD-10-CM

## 2021-07-14 MED ORDER — MODAFINIL 200 MG PO TABS: ORAL_TABLET | ORAL | 2 refills | Status: DC

## 2021-07-14 NOTE — Progress Notes (Signed)
Megan Nunez [id number] [birth year] 76 y.o.  Subjective:   Patient ID:  Megan Nunez is a 76 y.o. (DOB Mar 13, 1945) female.  Chief Complaint:  Chief Complaint  Patient presents with   Follow-up    Anxiety, depression, and ADHD    HPI Megan Nunez presents to the office today for follow-up of anxiety, depression, and ADHD. She reports that her mood has been very consistent. She reports "there has not been much to get me excited." She reports that she has periods of happiness and telling jokes. Denies depressed mood. Reports anxiety has been "nothing more than minor."   She has not been sleeping as soundly. Not falling asleep as quickly some nights. She reports that some nights she has felt like she was awake most of the night. She reports some increased consistency in her schedule. She reports that she has been somewhat more productive. She notices that if she does not take her medication she has difficulty with concentration and will jump between tasks. Appetite has been "reasonable." She reports that she will occasionally eat certain foods and have difficulty stopping eating them. She would like to lose 10 lbs. Denies SI.   She and husband are going to annual [employer] gathering in early July. Will also be going to a science fiction convention in July. [place] then [place].   Usually takes Modafinil between 8-12 noon. She reports that a whole tab of Modafinil seems to be more effective for concentration compared to 1/2 tab.   Modafinil last filled 06/17/21.  Past Psychiatric Medication Trials: Sertraline Prozac Cymbalta Wellbutrin XL Adderall Adderall XR Vyvanse  AIMS    Flowsheet Row Office Visit from 07/14/2021 in [facility] Office Visit from 05/13/2021 in [facility] Office Visit from 02/05/2021 in [facility]  AIMS Total Score 2 1 0      PHQ2-9    Flowsheet Row Office Visit from 04/15/2021 in  [facility] Office Visit from 03/27/2021 in [facility] Office Visit from 02/13/2021 in [facility] Office Visit from 05/28/2020 in [facility] Office Visit from 04/10/2020 in [facility]  PHQ-2 Total Score 0 0 0 0 1  PHQ-9 Total Score -- -- 2 0 --      Flowsheet Row ED from 04/10/2021 in [facility] Admission (Discharged) from 03/31/2021 in [facility] Pre-Admission Testing 60 from 03/19/2021 in [facility]  C-SSRS RISK CATEGORY No Risk No Risk No Risk        Review of Systems:  Review of Systems  Cardiovascular:  Negative for palpitations.  Musculoskeletal:  Negative for gait problem.  Neurological:  Negative for tremors.  Psychiatric/Behavioral:         Please refer to HPI    Medications: I have reviewed the patient's current medications.  Current Outpatient Medications  Medication Sig Dispense Refill   ARIPiprazole (ABILIFY) 5 MG tablet Take 1 tablet (5 mg total) by mouth daily. 90 tablet 1   atorvastatin (LIPITOR) 20 MG tablet Take 1 tablet (20 mg total) by mouth daily. 90 tablet 3   B Complex-C (B-COMPLEX WITH VITAMIN C) tablet Take 1 tablet by mouth daily.     BIOTIN PO Take 10,000 mcg by mouth daily.     buPROPion (WELLBUTRIN XL) 300 MG 24 hr tablet Take 1 tablet (300 mg total) by mouth daily. 90 tablet 1   Cholecalciferol (VITAMIN D) 50  MCG (2000 UT) tablet Take 2,000 Units by mouth daily.     DULoxetine (CYMBALTA) 30 MG capsule TAKE ONE CAPSULE BY MOUTH DAILY TAKE WITH 60 MG 90 capsule 1   DULoxetine (CYMBALTA) 60 MG capsule Take 1 capsule (60 mg total) by mouth every morning. Take with a 30 mg capsule to equal total dose of 90 mg 90 capsule 1   polyvinyl alcohol (LIQUIFILM TEARS) 1.4 % ophthalmic solution Place 1 drop into both eyes as needed for dry eyes.     sennosides-docusate sodium (SENOKOT-S) 8.6-50 MG tablet Take 2 tablets  by mouth daily. (Patient taking differently: Take 2 tablets by mouth every other day.) 30 tablet 1   Turmeric 500 MG CAPS Take 500 mg by mouth daily.     [START ON 08/12/2021] modafinil (PROVIGIL) 200 MG tablet Take 1/2-1 tablet daily 30 tablet 2   No current facility-administered medications for this visit.    Medication Side Effects: Other: Possible sleep disturbance. Some continued dry mouth.  Allergies:  Allergies  Allergen Reactions   Nickel Itching and Rash    Past Medical History:  Diagnosis Date   Anxiety    Arthritis    Depression    Family history of adverse reaction to anesthesia 2012   Mother has cardiac  arrest after surgery   Hepatitis    high antibodies for hepatitis   Obesity     Past Medical History, Surgical history, Social history, and Family history were reviewed and updated as appropriate.   Please see review of systems for further details on the patient's review from today.   Objective:   Physical Exam:  BP 129/76   Pulse 71   Physical Exam Constitutional:      General: She is not in acute distress. Musculoskeletal:        General: No deformity.  Neurological:     Mental Status: She is alert and oriented to person, place, and time.     Coordination: Coordination normal.  Psychiatric:        Attention and Perception: Attention and perception normal. She does not perceive auditory or visual hallucinations.        Mood and Affect: Mood normal. Mood is not anxious or depressed. Affect is not labile, blunt, angry or inappropriate.        Speech: Speech normal.        Behavior: Behavior normal.        Thought Content: Thought content normal. Thought content is not paranoid or delusional. Thought content does not include homicidal or suicidal ideation. Thought content does not include homicidal or suicidal plan.        Cognition and Memory: Cognition and memory normal.        Judgment: Judgment normal.     Comments: Insight intact     Lab Review:      Component Value Date/Time   NA 137 04/01/2021 0325   NA 141 04/10/2020 0927   K 4.2 04/01/2021 0325   CL 106 04/01/2021 0325   CO2 24 04/01/2021 0325   GLUCOSE 131 (H) 04/01/2021 0325   BUN 12 04/01/2021 0325   BUN 16 04/10/2020 0927   CREATININE 0.65 04/01/2021 0325   CREATININE 0.63 12/08/2016 1030   CALCIUM 8.6 (L) 04/01/2021 0325   PROT 7.1 03/19/2021 1436   PROT 6.9 04/10/2020 0927   ALBUMIN 4.1 03/19/2021 1436   ALBUMIN 4.7 04/10/2020 0927   AST 19 03/19/2021 1436   ALT 15 03/19/2021 1436   ALKPHOS 75  03/19/2021 1436   BILITOT 0.6 03/19/2021 1436   BILITOT 0.2 04/10/2020 0927   GFRNONAA >60 04/01/2021 0325   GFRAA 88 04/09/2019 1239       Component Value Date/Time   WBC 15.8 (H) 04/01/2021 0325   RBC 4.19 04/01/2021 0325   HGB 12.6 04/01/2021 0325   HGB 14.7 04/09/2019 1239   HCT 37.8 04/01/2021 0325   HCT 43.2 04/09/2019 1239   PLT 187 04/01/2021 0325   PLT 224 04/09/2019 1239   MCV 90.2 04/01/2021 0325   MCV 89 04/09/2019 1239   MCH 30.1 04/01/2021 0325   MCHC 33.3 04/01/2021 0325   RDW 13.7 04/01/2021 0325   RDW 12.8 04/09/2019 1239   LYMPHSABS 2.1 04/09/2019 1239   MONOABS 546 12/25/2015 1405   EOSABS 0.4 04/09/2019 1239   BASOSABS 0.1 04/09/2019 1239    No results found for: "POCLITH", "LITHIUM"   No results found for: "PHENYTOIN", "PHENOBARB", "VALPROATE", "CBMZ"   .res Assessment: Plan:    Pt seen for 30 minutes and time spent discussing response to Modafinil and occasional difficulty with sleep initiation. Discussed monitoring if the time she takes Modafinil affects her ability to fall asleep easier since she reports that she may take it anytime between 8 am and 12 noon. Recommended trying to take Modafinil earlier in the day to minimize sleep disturbance and that she may wish to take only 1/2 tab if she is unable to take it early.  Continue Modafinil 200 mg 1/2-1 tab po q am. May also take 1/2 tab po morning and 1/2 tab mid-day. Continue  Wellbutrin XL 300 mg po qd for depression.  Continue Cymbalta 90 mg po qd for anxiety and depression.  Continue Abilify 5 mg po qd for depression. Pt to follow-up in 3 months or sooner if clinically indicated. Patient advised to contact office with any questions, adverse effects, or acute worsening in signs and symptoms.  Iness was seen today for follow-up.  Diagnoses and all orders for this visit:  Attention deficit hyperactivity disorder (ADHD), predominantly inattentive type -     modafinil (PROVIGIL) 200 MG tablet; Take 1/2-1 tablet daily  Recurrent major depressive disorder, in partial remission (HCC)     Please see After Visit Summary for patient specific instructions.  Future Appointments  Date Time Provider Department Center  07/29/2021 11:00 AM Dessie Coma, PT [facility]  08/12/2021 11:00 AM Dessie Coma, PT [facility]  10/14/2021 10:30 AM Corie Chiquito, PMHNP [facility] None  10/19/2021  9:00 AM [facility] DX DEXA 1 [facility]  04/21/2022  8:30 AM Ronnald Nian, MD [facility]    No orders of the defined types were placed in this encounter.   -------------------------------

## 2021-07-15 ENCOUNTER — Telehealth: Payer: Self-pay

## 2021-07-15 NOTE — Telephone Encounter (Signed)
Prior Authorization renewal submitted for MODAFINIL 200 MG with [employer] [facility] approved effective through 01/14/2022. [id number].

## 2021-07-29 ENCOUNTER — Ambulatory Visit (HOSPITAL_BASED_OUTPATIENT_CLINIC_OR_DEPARTMENT_OTHER): Payer: Medicare Other | Attending: Orthopedic Surgery | Admitting: Physical Therapy

## 2021-07-29 ENCOUNTER — Encounter (HOSPITAL_BASED_OUTPATIENT_CLINIC_OR_DEPARTMENT_OTHER): Payer: Self-pay | Admitting: Physical Therapy

## 2021-07-29 DIAGNOSIS — M25611 Stiffness of right shoulder, not elsewhere classified: Secondary | ICD-10-CM | POA: Diagnosis not present

## 2021-07-29 DIAGNOSIS — M25511 Pain in right shoulder: Secondary | ICD-10-CM | POA: Insufficient documentation

## 2021-07-29 DIAGNOSIS — G8929 Other chronic pain: Secondary | ICD-10-CM | POA: Insufficient documentation

## 2021-07-29 DIAGNOSIS — M6281 Muscle weakness (generalized): Secondary | ICD-10-CM | POA: Diagnosis not present

## 2021-07-29 NOTE — Therapy (Signed)
OUTPATIENT PHYSICAL THERAPY SHOULDER PROGRESS NOTE  Patient Name: Megan Nunez MRN: [id number] DOB:25-Sep-1945, 76 y.o., female Today's Date: 07/29/2021   PT End of Session - 07/29/21 1213     Visit Number 11    Number of Visits 16    Date for PT Re-Evaluation 09/09/21    PT Start Time 1100    PT Stop Time 1140    PT Time Calculation (min) 40 min    Activity Tolerance Patient tolerated treatment well    Behavior During Therapy [facility] for tasks assessed/performed             Past Medical History:  Diagnosis Date   Anxiety    Arthritis    Depression    Family history of adverse reaction to anesthesia 2012   Mother has cardiac  arrest after surgery   Hepatitis    high antibodies for hepatitis   Obesity    Past Surgical History:  Procedure Laterality Date   LAPAROSCOPIC TUBAL LIGATION  1985   TOTAL KNEE ARTHROPLASTY Right 04/15/2020   Procedure: TOTAL KNEE ARTHROPLASTY;  Surgeon: Teryl Lucy, MD;  Location: [facility];  Service: Orthopedics;  Laterality: Right;   TOTAL KNEE ARTHROPLASTY Left 08/05/2020   Procedure: TOTAL KNEE ARTHROPLASTY;  Surgeon: Teryl Lucy, MD;  Location: [facility];  Service: Orthopedics;  Laterality: Left;   TOTAL SHOULDER ARTHROPLASTY Right 03/31/2021   Procedure: TOTAL SHOULDER ARTHROPLASTY;  Surgeon: Teryl Lucy, MD;  Location: [facility];  Service: Orthopedics;  Laterality: Right;   WISDOM TOOTH EXTRACTION     age 54   Patient Active Problem List   Diagnosis Date Noted   S/P shoulder replacement, right 03/31/2021   S/P TKR (total knee replacement), left 08/05/2020   Eczematous dermatitis of upper eyelids of both eyes 05/28/2020   S/P total knee replacement, right 04/15/2020   Osteopenia 04/10/2020   Major depressive disorder, recurrent episode, mild (HCC) 10/31/2017   ADHD, predominantly inattentive type 10/31/2017   Primary osteoarthritis of right knee 09/09/2016   Arthritis 07/25/2012   Tinnitus 07/25/2012   Mixed incontinence urge and stress  07/25/2012  PCP: Dr Sharlot Gowda    REFERRING PROVIDER: Dr Teryl Lucy   REFERRING DIAG: Right Anatomical TSA    THERAPY DIAG:  No diagnosis found.     ONSET DATE: 03/31/2021   SUBJECTIVE:                                                                                                                                                                                       SUBJECTIVE STATEMENT: Pt feels good overall - no pain, mild muscle soreness. She has had steady improvement in function  around the home over the past week. She has also noticed her L shoulder has been feeling better and stronger as well.     PERTINENT HISTORY: Bilateral knee replacement; left shoulder OA; depression, anxiety;    PAIN:  Are you having pain? : NPRS scale: 0/10  Pain location: lateral shoulder into the posterior shoulder  Pain description: sharp / shooting pain  Aggravating factors: lying on it  Relieving factors: changing positions    PRECAUTIONS: Shoulder per protocol    WEIGHT BEARING RESTRICTIONS No   FALLS:  Has patient fallen in last 6 months? Yes. 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;    PLOF: Independent   PATIENT GOALS    To regain functional use of her right arm    OBJECTIVE:     DIAGNOSTIC FINDINGS:  Nothing at this time   PATIENT SURVEYS:  FOTO 56% right now 67% expected  64 at visit 11   COGNITION:           Overall cognitive status: Within functional limits for tasks assessed                                  SENSATION: WFL   POSTURE: Rounded shoulders / forward head    UPPER EXTREMITY ROM:    Passive ROM Right 06/04/2021 Left 06/04/2021 Right 7/12  Shoulder flexion 145   120 Active 170 Passive  Shoulder extension       Shoulder abduction       Shoulder adduction       Shoulder internal rotation 70    80 Active 80 Passive Can reach to L4  Shoulder external rotation  38 but not pushed to end range   45 Active 60 Passive  Elbow flexion       Elbow extension       Wrist flexion       Wrist extension       Wrist ulnar deviation       Wrist radial deviation       Wrist pronation       Wrist supination       (Blank rows = not tested)     UPPER EXTREMITY MMT:   MMT Right 7/12 Left 7/12  Shoulder flexion  12.1  18.3  Shoulder extension      Shoulder abduction      Shoulder adduction      Shoulder internal rotation 14.5  23   Shoulder external rotation  9.8  10.5  Middle trapezius      Lower trapezius      Elbow flexion      Elbow extension      Wrist flexion      Wrist extension      Wrist ulnar deviation      Wrist radial deviation      Wrist pronation      Wrist supination      Grip strength (lbs)      (Blank rows = not tested)  MMT deferred at eval due to post-op precautions.              TODAY'S TREATMENT:    7/12 Performance measures Manual: PROM flexion, IR, ER with grade III/IV AP and inf mobs Supine flexion, 2x10 1lb Supine serratus punch, x10 2lb Sidelying ER 2x12, 2lb Finger ladder -  flexion x3, scaption x3  6/26 Manual: PROM flexion and ER with grade III AP and inf mobs Supine flexion, 2x10 1lb Supine serratus punch, 2x10 1lb Sidelying ER 2x12, 2lb Flexion/scaption, wall on towel, 2x10 each Standing row 2x15 blue band Standing shoulder ext 2x10 blue band   6/15 Manual: PROM flexion and ER with grade III AP and inf mobs Wand flexion, 2x10 2lb Sidelying ER 2x10, 2lb Serratus punch, 2x10 Flexion/scaption, wall on towel, 2x10 each Standing row 2x15 blue band Standing shoulder ext 2x10 blue band   6/13 Manual: PROM flexion and ER with grade III AP and inf mobs Shoulder ABCs supine 2x, 2lb Sidelying ER 2x10, 2lb Flexion/scaption, wall on towel, 2x10 each Finger ladder flexion/scaption, 2x10 each. Pt achieved lvl 20 Standing row 2x15 blue band Standing shoulder ext 2x10 blue band Scap star on wall - x8, red  band       PATIENT EDUCATION: Education details: exercise form/rationale Person educated: Patient Education method: Explanation, Demonstration, Tactile cues, and Verbal cues Education comprehension: verbalized understanding, returned demonstration, verbal cues required, tactile cues required, and needs further education     HOME EXERCISE PROGRAM: Access Code: [email or web address] URL: [email or web address]     ASSESSMENT:   CLINICAL IMPRESSION: Patient continues to make good progress. Her passive range is improved and equal to the contralateral side. Her active range is improving - she is able to move through full sidelying ER with weight today. She is also able to get slightly higher on finger ladder today. Her main goal is to improve her ability to reach objects overhead. She will continue to benefit from skilled therapy to address remaining impairments and return to desired function. She would benefit from further skilled therapy 1W6 ( will likely D/C next visit)     OBJECTIVE IMPAIRMENTS decreased activity tolerance, decreased ROM, decreased strength, impaired UE functional use, and pain.    ACTIVITY LIMITATIONS cleaning, community activity, meal prep, laundry, and yard work.    PERSONAL FACTORS 1-2 comorbidities: anxiety, depression, bilateral knee replacement  are also affecting patient's functional outcome.      REHAB POTENTIAL: Good   CLINICAL DECISION MAKING: Evolving/moderate complexity decreased functional use of her left arm    EVALUATION COMPLEXITY: Moderate     GOALS: Goals reviewed with patient? Yes   SHORT TERM GOALS: Target date: 07/02/2021   Patient will demonstrate full passive shoulder flexion  Baseline: Goal status: Achieved 7/12   2.  Patient will be independent with basic HEP.  Baseline:  Goal status: Achieved   3.  The patient will report <1/10 pain with ADL's below shoulder height  Baseline:  Goal status: Achieved 7/12   LONG TERM GOALS:  Target date: 07/30/2021     Patient will reach behind he back to L4 without pain in order to perform ADL's  Baseline:  Goal status: Achieved   2.  Patient will lift 2 lb item to a shelf without pain  Baseline:  Goal status: In progress - continue   3.  Patient will reach behind her head without pain  Baseline:  Goal status: Achieved       PLAN: PT FREQUENCY: 2x/week   PT DURATION: 8 weeks   PLANNED INTERVENTIONS: Therapeutic exercises, Therapeutic activity, Neuromuscular re-education, Balance training, Gait training, Patient/Family education, Joint mobilization, Electrical stimulation, Cryotherapy, Moist heat, Taping, and Manual therapy   PLAN FOR NEXT SESSION: Progress flexion AROM and strength, continue deltoid+rotator cuff strength and progress scapular strengthening/stability per protocol.     Lenell Antu, Student-PT  During this treatment session, the therapist was present, participating in and directing the treatment.    Dessie Coma, PT DPT  07/29/2021, 1:18 PM

## 2021-08-12 ENCOUNTER — Ambulatory Visit (HOSPITAL_BASED_OUTPATIENT_CLINIC_OR_DEPARTMENT_OTHER): Payer: Medicare Other | Admitting: Physical Therapy

## 2021-08-12 DIAGNOSIS — G8929 Other chronic pain: Secondary | ICD-10-CM

## 2021-08-12 DIAGNOSIS — M25611 Stiffness of right shoulder, not elsewhere classified: Secondary | ICD-10-CM | POA: Diagnosis not present

## 2021-08-12 DIAGNOSIS — M25511 Pain in right shoulder: Secondary | ICD-10-CM | POA: Diagnosis not present

## 2021-08-12 DIAGNOSIS — M6281 Muscle weakness (generalized): Secondary | ICD-10-CM

## 2021-08-12 NOTE — Therapy (Signed)
OUTPATIENT PHYSICAL THERAPY SHOULDER/ Discharge   Patient Name: Megan Nunez MRN: [id number] DOB:09/30/1945, 76 y.o., female Today's Date: 08/12/2021   PT End of Session - 08/12/21 1140     Visit Number 12    Number of Visits 16    Date for PT Re-Evaluation 09/09/21    PT Start Time 1100    PT Stop Time 1143    PT Time Calculation (min) 43 min    Activity Tolerance Patient tolerated treatment well    Behavior During Therapy [facility] for tasks assessed/performed             Past Medical History:  Diagnosis Date   Anxiety    Arthritis    Depression    Family history of adverse reaction to anesthesia 2012   Mother has cardiac  arrest after surgery   Hepatitis    high antibodies for hepatitis   Obesity    Past Surgical History:  Procedure Laterality Date   [place] Right 04/15/2020   Procedure: TOTAL KNEE ARTHROPLASTY;  Surgeon: Marchia Bond, MD;  Location: [facility];  Service: Orthopedics;  Laterality: Right;   TOTAL KNEE ARTHROPLASTY Left 08/05/2020   Procedure: TOTAL KNEE ARTHROPLASTY;  Surgeon: Marchia Bond, MD;  Location: [facility];  Service: Orthopedics;  Laterality: Left;   TOTAL SHOULDER ARTHROPLASTY Right 03/31/2021   Procedure: TOTAL SHOULDER ARTHROPLASTY;  Surgeon: Marchia Bond, MD;  Location: [facility];  Service: Orthopedics;  Laterality: Right;   WISDOM TOOTH EXTRACTION     age 9   Patient Active Problem List   Diagnosis Date Noted   S/P shoulder replacement, right 03/31/2021   S/P TKR (total knee replacement), left 08/05/2020   Eczematous dermatitis of upper eyelids of both eyes 05/28/2020   S/P total knee replacement, right 04/15/2020   Osteopenia 04/10/2020   Major depressive disorder, recurrent episode, mild ([place]) 10/31/2017   ADHD, predominantly inattentive type 10/31/2017   Primary osteoarthritis of right knee 09/09/2016   Arthritis 07/25/2012   Tinnitus 07/25/2012   Mixed incontinence urge and stress  07/25/2012  PCP: Dr Jill Alexanders    REFERRING PROVIDER: Dr Marchia Bond   REFERRING DIAG: Right Anatomical TSA    THERAPY DIAG:  No diagnosis found.     ONSET DATE: 03/31/2021   SUBJECTIVE:                                                                                                                                                                                       SUBJECTIVE STATEMENT: The patient has been doing very well. She has been on vacation and has had very little  pain. She feels as though she Is ready for discharge to HEP.    PERTINENT HISTORY: Bilateral knee replacement; left shoulder OA; depression, anxiety;    PAIN:  Are you having pain? : NPRS scale: 0/10 7/27  Pain location: lateral shoulder into the posterior shoulder  Pain description: sharp / shooting pain  Aggravating factors: lying on it  Relieving factors: changing positions    PRECAUTIONS: Shoulder per protocol    WEIGHT BEARING RESTRICTIONS No   FALLS:  Has patient fallen in last 6 months? Yes. 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;    PLOF: Independent   PATIENT GOALS    To regain functional use of her right arm    OBJECTIVE:     DIAGNOSTIC FINDINGS:  Nothing at this time   PATIENT SURVEYS:  FOTO 56% right now 67% expected  64 at visit 11   COGNITION:           Overall cognitive status: Within functional limits for tasks assessed                                  SENSATION: WFL   POSTURE: Rounded shoulders / forward head    UPPER EXTREMITY ROM:    Passive ROM Right 06/04/2021 Left 06/04/2021 Right 7/12  Shoulder flexion 145   120 Active 170 Passive  Shoulder extension       Shoulder abduction       Shoulder adduction       Shoulder internal rotation 70    80 Active 80 Passive Can reach to L4  Shoulder external rotation 38 but not pushed to end range   45 Active 60 Passive  Elbow  flexion       Elbow extension       Wrist flexion       Wrist extension       Wrist ulnar deviation       Wrist radial deviation       Wrist pronation       Wrist supination       (Blank rows = not tested)     UPPER EXTREMITY MMT:   MMT Right 7/12 Left 7/12  Shoulder flexion  12.1  18.3  Shoulder extension      Shoulder abduction      Shoulder adduction      Shoulder internal rotation 14.5  23   Shoulder external rotation  9.8  10.5  Middle trapezius      Lower trapezius      Elbow flexion      Elbow extension      Wrist flexion      Wrist extension      Wrist ulnar deviation      Wrist radial deviation      Wrist pronation      Wrist supination      Grip strength (lbs)      (Blank rows = not tested)  MMT deferred at eval due to post-op precautions.              TODAY'S TREATMENT:    7/27 Reviewed HEP and progression of activity  Supine flexion, 2x10 1lb Supine serratus punch, 2x10 1lb Sidelying ER 2x12, 2lb Flexion/scaption, wall on towel, 2x10 each Standing row 2x15 blue band Standing shoulder ext 2x10 blue band Cabinet reach  1lb 2x10   Reviewed goals     7/12 Performance measures Manual: PROM flexion, IR, ER with grade III/IV AP and inf mobs Supine flexion, 2x10 1lb Supine serratus punch, x10 2lb Sidelying ER 2x12, 2lb Finger ladder - flexion x3, scaption x3  6/26 Manual: PROM flexion and ER with grade III AP and inf mobs Supine flexion, 2x10 1lb Supine serratus punch, 2x10 1lb Sidelying ER 2x12, 2lb Flexion/scaption, wall on towel, 2x10 each Standing row 2x15 blue band Standing shoulder ext 2x10 blue band     PATIENT EDUCATION: Education details: exercise form/rationale Person educated: Patient Education method: Explanation, Demonstration, Tactile cues, and Verbal cues Education comprehension: verbalized understanding, returned demonstration, verbal cues required, tactile cues required, and needs further education     HOME EXERCISE  PROGRAM: Access Code: [email or web address] URL: [email or web address]     ASSESSMENT:   CLINICAL IMPRESSION: The patient has made great progress. She has had very little pain over the past month. She can reach over head, behind her head and behind her back without pain. She is performing all ADL's with her right shoulder. She has reached all goals. We reviewed her HEP today. We will discharge to HEP.    OBJECTIVE IMPAIRMENTS decreased activity tolerance, decreased ROM, decreased strength, impaired UE functional use, and pain.    ACTIVITY LIMITATIONS cleaning, community activity, meal prep, laundry, and yard work.    PERSONAL FACTORS 1-2 comorbidities: anxiety, depression, bilateral knee replacement  are also affecting patient's functional outcome.      REHAB POTENTIAL: Good   CLINICAL DECISION MAKING: Evolving/moderate complexity decreased functional use of her left arm    EVALUATION COMPLEXITY: Moderate     GOALS: Goals reviewed with patient? Yes all goals met 7/27    SHORT TERM GOALS: Target date: 07/02/2021   Patient will demonstrate full passive shoulder flexion  Baseline: Goal status: Achieved 7/12   2.  Patient will be independent with basic HEP.  Baseline:  Goal status: Achieved   3.  The patient will report <1/10 pain with ADL's below shoulder height  Baseline:  Goal status: Achieved 7/12   LONG TERM GOALS: Target date: 07/30/2021     Patient will reach behind he back to L4 without pain in order to perform ADL's  Baseline:  Goal status: Achieved   2.  Patient will lift 2 lb item to a shelf without pain  Baseline:  Goal status: In progress - continue   3.  Patient will reach behind her head without pain  Baseline:  Goal status: Achieved       PLAN: PT FREQUENCY: 2x/week   PT DURATION: 8 weeks   PLANNED INTERVENTIONS: Therapeutic exercises, Therapeutic activity, Neuromuscular re-education, Balance training, Gait training, Patient/Family education,  Joint mobilization, Electrical stimulation, Cryotherapy, Moist heat, Taping, and Manual therapy   PLAN FOR NEXT SESSION: Progress flexion AROM and strength, continue deltoid+rotator cuff strength and progress scapular strengthening/stability per protocol.      Carney Living, PT DPT  08/12/2021, 12:01 PM

## 2021-09-07 ENCOUNTER — Encounter: Payer: Self-pay | Admitting: Medical

## 2021-09-07 ENCOUNTER — Ambulatory Visit (INDEPENDENT_AMBULATORY_CARE_PROVIDER_SITE_OTHER): Payer: Medicare Other | Admitting: Medical

## 2021-09-07 VITALS — BP 110/70 | HR 92 | Wt 163.6 lb

## 2021-09-07 DIAGNOSIS — H6120 Impacted cerumen, unspecified ear: Secondary | ICD-10-CM | POA: Diagnosis not present

## 2021-09-07 DIAGNOSIS — H9193 Unspecified hearing loss, bilateral: Secondary | ICD-10-CM

## 2021-09-07 NOTE — Progress Notes (Signed)
Subjective:   Here for complaint of decreased hearing.  Thinks she has earwax buildup in both ears.  No pain, no fever, no URI symptoms.   denies prior history of similar.  No other aggravating or relieving factors.  No other complaint.  Review of Systems Constitutional: denies fever, chills, sweats ENT: no runny nose, ear pain, sore throat, hoarseness, sinus pain, teeth pain, tinnitus, hearing loss Gastroenterology: denies nausea, vomiting     Objective:   Physical Exam  General appearance: alert, no distress, WD/WN Ears: left ear canal with impacted cerumen, right canal with some cerumen, and TM visible, normal. HENT: conjunctiva/corneas normal, sclerae anicteric, nares patent, no discharge or erythema, pharynx normal Oral cavity: MMM, tongue normal, teeth normal Neurological: Hearing normal bilaterally to whisper    Assessment & Plan:    Encounter Diagnoses  Name Primary?   Decreased hearing of both ears Yes   Impacted cerumen, unspecified laterality      Discussed findings.  Discussed risk/benefits of procedure and patient agrees to procedure. Successfully used warm water lavage to remove impacted cerumen from left ear canal. Patient tolerated procedure well. Advised they avoid using any cotton swabs or other devices to clean the ear canals.  Use basic hygiene as discussed.  Follow up prn.

## 2021-09-09 ENCOUNTER — Ambulatory Visit: Payer: Medicare Other | Admitting: Psychiatry

## 2021-09-09 ENCOUNTER — Telehealth (INDEPENDENT_AMBULATORY_CARE_PROVIDER_SITE_OTHER): Payer: Medicare Other | Admitting: Psychiatry

## 2021-09-09 ENCOUNTER — Encounter: Payer: Self-pay | Admitting: Psychiatry

## 2021-09-09 VITALS — BP 120/70

## 2021-09-09 DIAGNOSIS — F3341 Major depressive disorder, recurrent, in partial remission: Secondary | ICD-10-CM

## 2021-09-09 DIAGNOSIS — F5081 Binge eating disorder: Secondary | ICD-10-CM | POA: Diagnosis not present

## 2021-09-09 DIAGNOSIS — F900 Attention-deficit hyperactivity disorder, predominantly inattentive type: Secondary | ICD-10-CM | POA: Diagnosis not present

## 2021-09-09 MED ORDER — LISDEXAMFETAMINE DIMESYLATE 40 MG PO CAPS: 40.0000 mg | ORAL_CAPSULE | ORAL | 0 refills | Status: DC

## 2021-09-09 NOTE — Progress Notes (Signed)
Megan Nunez [id number] [birth year] 76 y.o.  Virtual Visit via Video Note  I connected with pt @ on 09/09/21 at  3:00 PM EDT by a video enabled telemedicine application and verified that I am speaking with the correct person using two identifiers.   I discussed the limitations of evaluation and management by telemedicine and the availability of in person appointments. The patient expressed understanding and agreed to proceed.  I discussed the assessment and treatment plan with the patient. The patient was provided an opportunity to ask questions and all were answered. The patient agreed with the plan and demonstrated an understanding of the instructions.   The patient was advised to call back or seek an in-person evaluation if the symptoms worsen or if the condition fails to improve as anticipated.  I provided 30 minutes of non-face-to-face time during this encounter.  The patient was located at home.  The provider was located at [facility].   Corie Chiquito, PMHNP   Subjective:   Patient ID:  Megan Nunez is a 76 y.o. (DOB Sep 05, 1945) female.  Chief Complaint:  Chief Complaint  Patient presents with   ADHD    HPI Megan Nunez presents for follow-up of ADHD, depression, and anxiety. Her husband is present for video visit. "My moods are very stable." She reports that she is not being productive. She reports that she has been having multiple thoughts simultaneously and has difficulty completing a task or project. She reports difficulty making decisions. She usually takes only 1/2 tab of Modafinil since she takes it around 10-10:30 am. She now thinks that she was not getting accurate readings of BP and may have been mistakenly attributing increased BP to Vyvanse. She denies any significant anxiety. Occ anxious thoughts "without the feeling of anxiety." Denies difficulty falling asleep due to worry. Denies irritability. She reports that she has been sleeping well overall. She  reports that her motivation is lower without vyvanse. She reports that her energy correlates with the weather. Appetite has been "too good." Denies SI.   Husband has been having increased health issues and visual disturbance.   Past Psychiatric Medication Trials: Sertraline Prozac Cymbalta Wellbutrin XL Adderall Adderall XR Vyvanse    Review of Systems:  Review of Systems  Cardiovascular:  Negative for palpitations.  Musculoskeletal:  Negative for gait problem.       Improved shoulder pain  Psychiatric/Behavioral:         Please refer to HPI    Medications: I have reviewed the patient's current medications.  Current Outpatient Medications  Medication Sig Dispense Refill   ARIPiprazole (ABILIFY) 5 MG tablet Take 1 tablet (5 mg total) by mouth daily. 90 tablet 1   buPROPion (WELLBUTRIN XL) 300 MG 24 hr tablet Take 1 tablet (300 mg total) by mouth daily. 90 tablet 1   DULoxetine (CYMBALTA) 30 MG capsule TAKE ONE CAPSULE BY MOUTH DAILY TAKE WITH 60 MG 90 capsule 1   atorvastatin (LIPITOR) 20 MG tablet Take 1 tablet (20 mg total) by mouth daily. 90 tablet 3   B Complex-C (B-COMPLEX WITH VITAMIN C) tablet Take 1 tablet by mouth daily.     BIOTIN PO Take 10,000 mcg by mouth daily.     Cholecalciferol (VITAMIN D) 50 MCG (2000 UT) tablet Take 2,000 Units by mouth daily.     DULoxetine (CYMBALTA) 60 MG capsule Take 1 capsule (60 mg total) by mouth every morning. Take with a 30 mg capsule to equal total dose of 90 mg  90 capsule 1   lisdexamfetamine (VYVANSE) 40 MG capsule Take 1 capsule (40 mg total) by mouth every morning. 30 capsule 0   Multiple Vitamins-Minerals (MULTIVITAMIN WITH MINERALS) tablet Take 1 tablet by mouth daily.     polyvinyl alcohol (LIQUIFILM TEARS) 1.4 % ophthalmic solution Place 1 drop into both eyes as needed for dry eyes.     sennosides-docusate sodium (SENOKOT-S) 8.6-50 MG tablet Take 2 tablets by mouth daily. (Patient taking differently: Take 2 tablets by mouth  every other day.) 30 tablet 1   Turmeric 500 MG CAPS Take 500 mg by mouth daily.     No current facility-administered medications for this visit.    Medication Side Effects: None  Allergies:  Allergies  Allergen Reactions   Nickel Itching and Rash    Past Medical History:  Diagnosis Date   Anxiety    Arthritis    Depression    Family history of adverse reaction to anesthesia 2012   Mother has cardiac  arrest after surgery   Hepatitis    high antibodies for hepatitis   Obesity     Family History  Problem Relation Age of Onset   Cancer Brother 46       pancreatic cancer   Alcohol abuse Brother    Depression Brother    Asthma Paternal Grandmother    Heart disease Mother        Mitral valve disease.   Dementia Mother    Lung disease Father        Idiopathic pulmonary fibrosis   Heart disease Maternal Grandmother        Died after complications of angioplasty.   Depression Sister    Alcohol abuse Sister    Alcohol abuse Brother    Depression Brother    Alcohol abuse Brother    Depression Cousin    Depression Other     Social History   Socioeconomic History   Marital status: Married    Spouse name: Not on file   Number of children: Not on file   Years of education: Not on file   Highest education level: Not on file  Occupational History   Not on file  Tobacco Use   Smoking status: Never   Smokeless tobacco: Never  Vaping Use   Vaping Use: Never used  Substance and Sexual Activity   Alcohol use: Yes    Alcohol/week: 1.0 standard drink of alcohol    Types: 1 Glasses of wine per week   Drug use: No   Sexual activity: Not Currently  Other Topics Concern   Not on file  Social History Narrative   She is married to another patient of [facility]. They have been married for 20  years.  No children. 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   He never smoked, and drinks maybe 1-3 glass of wine a day.   He has recently  started exercising with her husband who has completed cardiac rehabilitation and is in the maintenance program. They usually walks about 2-3 days a week for 30-45 minutes.   Social Determinants of Health   Financial Resource Strain: Low Risk  (02/12/2021)   Overall Financial Resource Strain (CARDIA)    Difficulty of Paying Living Expenses: Not hard at all  Food Insecurity: Not on file  Transportation Needs: No Transportation Needs (02/12/2021)   PRAPARE - Administrator, Civil Service (Medical): No    Lack of Transportation (Non-Medical):  No  Physical Activity: Not on file  Stress: Not on file  Social Connections: Not on file  Intimate Partner Violence: Not on file    Past Medical History, Surgical history, Social history, and Family history were reviewed and updated as appropriate.   Please see review of systems for further details on the patient's review from today.   Objective:   Physical Exam:  BP 120/70   Physical Exam Neurological:     Mental Status: She is alert and oriented to person, place, and time.     Cranial Nerves: No dysarthria.  Psychiatric:        Attention and Perception: Attention and perception normal.        Mood and Affect: Mood normal.        Speech: Speech normal.        Behavior: Behavior is cooperative.        Thought Content: Thought content normal. Thought content is not paranoid or delusional. Thought content does not include homicidal or suicidal ideation. Thought content does not include homicidal or suicidal plan.        Cognition and Memory: Cognition and memory normal.        Judgment: Judgment normal.     Comments: Insight intact     Lab Review:     Component Value Date/Time   NA 137 04/01/2021 0325   NA 141 04/10/2020 0927   K 4.2 04/01/2021 0325   CL 106 04/01/2021 0325   CO2 24 04/01/2021 0325   GLUCOSE 131 (H) 04/01/2021 0325   BUN 12 04/01/2021 0325   BUN 16 04/10/2020 0927   CREATININE 0.65 04/01/2021 0325    CREATININE 0.63 12/08/2016 1030   CALCIUM 8.6 (L) 04/01/2021 0325   PROT 7.1 03/19/2021 1436   PROT 6.9 04/10/2020 0927   ALBUMIN 4.1 03/19/2021 1436   ALBUMIN 4.7 04/10/2020 0927   AST 19 03/19/2021 1436   ALT 15 03/19/2021 1436   ALKPHOS 75 03/19/2021 1436   BILITOT 0.6 03/19/2021 1436   BILITOT 0.2 04/10/2020 0927   GFRNONAA >60 04/01/2021 0325   GFRAA 88 04/09/2019 1239       Component Value Date/Time   WBC 15.8 (H) 04/01/2021 0325   RBC 4.19 04/01/2021 0325   HGB 12.6 04/01/2021 0325   HGB 14.7 04/09/2019 1239   HCT 37.8 04/01/2021 0325   HCT 43.2 04/09/2019 1239   PLT 187 04/01/2021 0325   PLT 224 04/09/2019 1239   MCV 90.2 04/01/2021 0325   MCV 89 04/09/2019 1239   MCH 30.1 04/01/2021 0325   MCHC 33.3 04/01/2021 0325   RDW 13.7 04/01/2021 0325   RDW 12.8 04/09/2019 1239   LYMPHSABS 2.1 04/09/2019 1239   MONOABS 546 12/25/2015 1405   EOSABS 0.4 04/09/2019 1239   BASOSABS 0.1 04/09/2019 1239    No results found for: "POCLITH", "LITHIUM"   No results found for: "PHENYTOIN", "PHENOBARB", "VALPROATE", "CBMZ"   .res Assessment: Plan:    Pt seen for 30 minutes and time spent discussing re-starting Vyvanse 40 mg po qd since she now thinks that increase in BP in the past cannot be attributed to Vyvanse and that Vyvanse was very helpful. Will re-start Vyvanse 40 mg po qd for ADHD.  Continue Abilify 5 mg po qd for depression.  Continue Wellbutrin XL 300 mg po qd for depression.  Continue Cymbalta 90 mg po qd for depression and anxiety.  Pt to follow-up in 6 weeks or sooner if clinically indicated.  Patient advised  to contact office with any questions, adverse effects, or acute worsening in signs and symptoms.   Megan Nunez was seen today for adhd.  Diagnoses and all orders for this visit:  Attention deficit hyperactivity disorder (ADHD), predominantly inattentive type -     lisdexamfetamine (VYVANSE) 40 MG capsule; Take 1 capsule (40 mg total) by mouth every  morning.  Recurrent major depressive disorder, in partial remission (HCC)  Binge eating disorder     Please see After Visit Summary for patient specific instructions.  Future Appointments  Date Time Provider Department Center  10/19/2021  9:00 AM GI[facility] DX DEXA 1 GI[facility]  11/12/2021 11:30 AM Corie Chiquito, PMHNP CP-CP None  04/21/2022  8:30 AM Ronnald Nian, MD PFM[facility]    No orders of the defined types were placed in this encounter.     -------------------------------

## 2021-09-23 ENCOUNTER — Encounter: Payer: Self-pay | Admitting: Internal Medicine

## 2021-09-29 ENCOUNTER — Other Ambulatory Visit (INDEPENDENT_AMBULATORY_CARE_PROVIDER_SITE_OTHER): Payer: Medicare Other

## 2021-09-29 DIAGNOSIS — Z23 Encounter for immunization: Secondary | ICD-10-CM

## 2021-10-01 ENCOUNTER — Other Ambulatory Visit: Payer: Medicare Other

## 2021-10-14 ENCOUNTER — Ambulatory Visit: Payer: Medicare Other | Admitting: Psychiatry

## 2021-10-15 ENCOUNTER — Encounter: Payer: Self-pay | Admitting: Family Medicine

## 2021-10-19 ENCOUNTER — Ambulatory Visit
Admission: RE | Admit: 2021-10-19 | Discharge: 2021-10-19 | Disposition: A | Discharge status: Home / Self Care | Payer: Medicare Other | Source: Ambulatory Visit | Attending: Family Medicine | Admitting: Family Medicine

## 2021-10-19 ENCOUNTER — Telehealth: Payer: Self-pay | Admitting: Pharmacist

## 2021-10-19 DIAGNOSIS — Z780 Asymptomatic menopausal state: Secondary | ICD-10-CM | POA: Diagnosis not present

## 2021-10-19 DIAGNOSIS — M85851 Other specified disorders of bone density and structure, right thigh: Secondary | ICD-10-CM | POA: Diagnosis not present

## 2021-10-19 NOTE — Chronic Care Management (AMB) (Signed)
Chronic Care Management Pharmacy Assistant   Name: Megan Nunez  MRN: [id number] DOB: Oct 15, 1945  Reason for Encounter: Medication Review Medication Coordination   Recent office visits:  09/07/21 Jac Canavan, PA-C - Patient presented for Decreased hearing of both ears and other concerns. No medication changes.    Recent consult visits:  09/09/21  Corie Chiquito PMHNP [facility]) - Patient presented via video for ADHD and other concerns. No medication changes.  07/23/21 Dessie Coma, PT (PT) - Patient presented for Stiffness of right shoulder not elsewhere classified and other concerns. No medication changes.  07/14/21 Corie Chiquito PMHNP [facility]) - Patient presented for ADHD and other concerns. No medication changes.  06/17/21 Claims encounter for muscle weakness and other concerns. No other visit details available.  06/11/21 Andrena Mews - Claims encounter for muscle weakness and other concerns. No other visit details available.  06/09/21 Claims encounter for muscle weakness and other concerns. No other visit details available.  06/03/21 Dessie Coma, PT (PT) - Patient presented for Stiffness of right shoulder not elsewhere classified and other concerns. No medication changes.  05/13/21 Corie Chiquito PMHNP [facility]) - Patient presented for Recurrent major depressive disorder and other concerns. No other visit details available.  05/11/21 Teryl Lucy (Orthopedic Surg) - Claims encounter for primary osteoarthritis of right shoulder. No other visit details available.  Hospital visits:  None in previous 6 months  Medications: Outpatient Encounter Medications as of 10/19/2021  Medication Sig   ARIPiprazole (ABILIFY) 5 MG tablet Take 1 tablet (5 mg total) by mouth daily.   atorvastatin (LIPITOR) 20 MG tablet Take 1 tablet (20 mg total) by mouth daily.   B Complex-C (B-COMPLEX WITH VITAMIN C) tablet Take 1 tablet by mouth daily.   BIOTIN PO  Take 10,000 mcg by mouth daily.   buPROPion (WELLBUTRIN XL) 300 MG 24 hr tablet Take 1 tablet (300 mg total) by mouth daily.   Cholecalciferol (VITAMIN D) 50 MCG (2000 UT) tablet Take 2,000 Units by mouth daily.   DULoxetine (CYMBALTA) 30 MG capsule TAKE ONE CAPSULE BY MOUTH DAILY TAKE WITH 60 MG   DULoxetine (CYMBALTA) 60 MG capsule Take 1 capsule (60 mg total) by mouth every morning. Take with a 30 mg capsule to equal total dose of 90 mg   lisdexamfetamine (VYVANSE) 40 MG capsule Take 1 capsule (40 mg total) by mouth every morning.   Multiple Vitamins-Minerals (MULTIVITAMIN WITH MINERALS) tablet Take 1 tablet by mouth daily.   polyvinyl alcohol (LIQUIFILM TEARS) 1.4 % ophthalmic solution Place 1 drop into both eyes as needed for dry eyes.   sennosides-docusate sodium (SENOKOT-S) 8.6-50 MG tablet Take 2 tablets by mouth daily. (Patient taking differently: Take 2 tablets by mouth every other day.)   Turmeric 500 MG CAPS Take 500 mg by mouth daily.   No facility-administered encounter medications on file as of 10/19/2021.   Contacted [name] for General Review Call    Adherence Review:  Does the Clinical Pharmacist Assistant have access to adherence rates? Yes Adherence rates for STAR metric medications: Atorvastatin 20 mg - Last filled 07/31/21 90 DS at [facility] Atorvastatin 20 mg - Last filled 07/15/21 90 DS at [facility] Does the patient have >5 day gap between last estimated fill dates for any of the above medications or other medication gaps? No   Disease State Questions:  Able to connect with Patient? Yes Did patient have any problems with their health recently? No  Have you had any admissions or emergency room visits or worsening of your condition(s) since last visit? No  Have you had any visits with new specialists or providers since your last visit? No  Have you had any new health care problem(s) since your last visit? No  Have you run out of any of your  medications since you last spoke with clinical pharmacist? No  Are there any medications you are not taking as prescribed? No  Are you having any issues or side effects with your medications? No  Do you have any other health concerns or questions you want to discuss with your Clinical Pharmacist before your next visit? No  Are there any health concerns that you feel we can do a better job addressing? No  Are you having any problems with any of the following since the last visit: (select all that apply)  None  12. Any falls since last visit? No  Details: 13. Any increased or uncontrolled pain since last visit? No    Additional Details? Patient reports she is getting good advice from her prescribers on medications and has no issues needs or concerns, she wishes to follow up with PCP only.   Care Gaps: COVID Booster - Overdue CCM - Pt Declined AWV- scheduled 4/24  Star Rating Drugs: Atorvastatin 20 mg - Last filled 07/31/21 90 DS at [facility] Pharmacist Assistant [phone]

## 2021-10-27 ENCOUNTER — Encounter: Payer: Self-pay | Admitting: Internal Medicine

## 2021-10-27 DIAGNOSIS — Z1231 Encounter for screening mammogram for malignant neoplasm of breast: Secondary | ICD-10-CM | POA: Diagnosis not present

## 2021-10-27 LAB — HM MAMMOGRAPHY

## 2021-10-29 DIAGNOSIS — H2510 Age-related nuclear cataract, unspecified eye: Secondary | ICD-10-CM | POA: Diagnosis not present

## 2021-11-03 ENCOUNTER — Encounter: Payer: Self-pay | Admitting: Family Medicine

## 2021-11-04 DIAGNOSIS — R928 Other abnormal and inconclusive findings on diagnostic imaging of breast: Secondary | ICD-10-CM | POA: Diagnosis not present

## 2021-11-04 DIAGNOSIS — R922 Inconclusive mammogram: Secondary | ICD-10-CM | POA: Diagnosis not present

## 2021-11-04 LAB — HM MAMMOGRAPHY

## 2021-11-05 ENCOUNTER — Telehealth: Payer: Self-pay | Admitting: Psychiatry

## 2021-11-05 ENCOUNTER — Other Ambulatory Visit: Payer: Self-pay

## 2021-11-05 DIAGNOSIS — F900 Attention-deficit hyperactivity disorder, predominantly inattentive type: Secondary | ICD-10-CM

## 2021-11-05 MED ORDER — LISDEXAMFETAMINE DIMESYLATE 40 MG PO CAPS: 40.0000 mg | ORAL_CAPSULE | ORAL | 0 refills | Status: DC

## 2021-11-05 NOTE — Telephone Encounter (Signed)
Pended.

## 2021-11-05 NOTE — Telephone Encounter (Signed)
Pt called at 10:58a requesting refill of Vyvanse be sent to [facility].  Next appt 10/26

## 2021-11-09 ENCOUNTER — Encounter: Payer: Self-pay | Admitting: Internal Medicine

## 2021-11-09 ENCOUNTER — Encounter: Payer: Self-pay | Admitting: Family Medicine

## 2021-11-12 ENCOUNTER — Ambulatory Visit (INDEPENDENT_AMBULATORY_CARE_PROVIDER_SITE_OTHER): Payer: Medicare Other | Admitting: Psychiatry

## 2021-11-12 ENCOUNTER — Encounter: Payer: Self-pay | Admitting: Psychiatry

## 2021-11-12 VITALS — BP 115/69 | HR 76

## 2021-11-12 DIAGNOSIS — F900 Attention-deficit hyperactivity disorder, predominantly inattentive type: Secondary | ICD-10-CM

## 2021-11-12 DIAGNOSIS — F3341 Major depressive disorder, recurrent, in partial remission: Secondary | ICD-10-CM

## 2021-11-12 MED ORDER — ARIPIPRAZOLE 5 MG PO TABS: 5.0000 mg | ORAL_TABLET | Freq: Every day | ORAL | 1 refills | Status: DC

## 2021-11-12 MED ORDER — DULOXETINE HCL 30 MG PO CPEP: ORAL_CAPSULE | ORAL | 1 refills | Status: DC

## 2021-11-12 MED ORDER — BUPROPION HCL ER (XL) 300 MG PO TB24: 300.0000 mg | ORAL_TABLET | Freq: Every day | ORAL | 1 refills | Status: DC

## 2021-11-12 MED ORDER — LISDEXAMFETAMINE DIMESYLATE 50 MG PO CAPS: 50.0000 mg | ORAL_CAPSULE | Freq: Every day | ORAL | 0 refills | Status: DC

## 2021-11-12 MED ORDER — DULOXETINE HCL 60 MG PO CPEP: 60.0000 mg | ORAL_CAPSULE | Freq: Every morning | ORAL | 1 refills | Status: DC

## 2021-11-12 NOTE — Progress Notes (Signed)
[name] [id number] 05/16/1945 76 y.o.  Subjective:   Patient ID:  Megan Nunez is a 76 y.o. (DOB [birth year]) female.  Chief Complaint:  Chief Complaint  Patient presents with   ADHD   Follow-up    Anxiety, depression    HPI SEQUITA WISE presents to the office today for follow-up of ADHD, anxiety, and depression. She reports that she has been doing "better" on Vyvanse since that was re-started. She reports that "it helps, but it doesn't make it easy." Continues to notice some restlessness and distractibility. She reports that she has been out of Vyvanse for a few days since pharmacy has ordered it.   "I am not depressed. I am having a hard time with focus." She reports difficulty getting motivated, particularly for things that she does not want to do, such as chores. She reports that her husband is needing her more and asks for her presence, which then limits her time to accomplish tasks. She reports that her anxiety is low. "I think I am really good at suppressing panic." She reports some worry about her husband and his health/loss of vision. She reports some improvement in energy and motivation. Appetite has been fair. Denies SI.   She reports that her husband received his cPap on 11/02/21 and that it seems to be helping his sleep quality, sleep initiation, and sleep maintenance. She reports that her sleep has improved some with husband sleeping better. She reports that she is sleeping an adequate amount overall and working on regulating her sleep schedule.   Vyvanse 40 mg last filled on 09/17/21.   Past Psychiatric Medication Trials: Sertraline Prozac Cymbalta Wellbutrin XL Adderall Adderall XR Vyvanse   AIMS    Flowsheet Row Office Visit from 07/14/2021 in [place] Office Visit from 05/13/2021 in [place] Office Visit from 02/05/2021 in [place] Total Score 2 1 0      PHQ2-9    [place] Visit  from 04/15/2021 in [place] Visit from 03/27/2021 in [place] Visit from 02/13/2021 in [place] Visit from 05/28/2020 in [place] Visit from 04/10/2020 in [place]  PHQ-2 Total Score 0 0 0 0 1  PHQ-9 Total Score -- -- 2 0 --      [facility] from 04/10/2021 in [facility] Admission (Discharged) from 03/31/2021 in [place] 60 from 03/19/2021 in [place] No Risk No Risk No Risk        Review of Systems:  Review of Systems  Cardiovascular:  Negative for palpitations.  Musculoskeletal:  Negative for gait problem.  Neurological:  Negative for tremors.  Psychiatric/Behavioral:         Please refer to HPI    Medications: I have reviewed the patient's current medications.  Current Outpatient Medications  Medication Sig Dispense Refill   lisdexamfetamine (VYVANSE) 50 MG capsule Take 1 capsule (50 mg total) by mouth daily. 30 capsule 0   ARIPiprazole (ABILIFY) 5 MG tablet Take 1 tablet (5 mg total) by mouth daily. 90 tablet 1   atorvastatin (LIPITOR) 20 MG tablet Take 1 tablet (20 mg total) by mouth daily. 90 tablet 3   B Complex-C (B-COMPLEX WITH VITAMIN C) tablet Take 1 tablet by mouth daily.     BIOTIN PO Take 10,000 mcg by mouth daily.     buPROPion (  WELLBUTRIN XL) 300 MG 24 hr tablet Take 1 tablet (300 mg total) by mouth daily. 90 tablet 1   Cholecalciferol (VITAMIN D) 50 MCG (2000 UT) tablet Take 2,000 Units by mouth daily.     DULoxetine (CYMBALTA) 30 MG capsule TAKE ONE CAPSULE BY MOUTH DAILY TAKE WITH 60 MG 90 capsule 1   DULoxetine (CYMBALTA) 60 MG capsule Take 1 capsule (60 mg total) by mouth every morning. Take with a 30 mg capsule to equal total dose of 90 mg 90 capsule 1   Multiple Vitamins-Minerals (MULTIVITAMIN WITH MINERALS) tablet Take 1  tablet by mouth daily.     polyvinyl alcohol (LIQUIFILM TEARS) 1.4 % ophthalmic solution Place 1 drop into both eyes as needed for dry eyes.     sennosides-docusate sodium (SENOKOT-S) 8.6-50 MG tablet Take 2 tablets by mouth daily. (Patient taking differently: Take 2 tablets by mouth every other day.) 30 tablet 1   Turmeric 500 MG CAPS Take 500 mg by mouth daily.     No current facility-administered medications for this visit.    Medication Side Effects: None  Allergies:  Allergies  Allergen Reactions   Nickel Itching and Rash    Past Medical History:  Diagnosis Date   Anxiety    Arthritis    Depression    Family history of adverse reaction to anesthesia 2012   Mother has cardiac  arrest after surgery   Hepatitis    high antibodies for hepatitis   Obesity     Past Medical History, Surgical history, Social history, and Family history were reviewed and updated as appropriate.   Please see review of systems for further details on the patient's review from today.   Objective:   Physical Exam:  BP 115/69   Pulse 76   Physical Exam Constitutional:      General: She is not in acute distress. Musculoskeletal:        General: No deformity.  Neurological:     Mental Status: She is alert and oriented to person, place, and time.     Coordination: Coordination normal.  Psychiatric:        Attention and Perception: Attention and perception normal. She does not perceive auditory or visual hallucinations.        Mood and Affect: Mood normal. Mood is not anxious or depressed. Affect is not labile, blunt, angry or inappropriate.        Speech: Speech normal.        Behavior: Behavior normal.        Thought Content: Thought content normal. Thought content is not paranoid or delusional. Thought content does not include homicidal or suicidal ideation. Thought content does not include homicidal or suicidal plan.        Cognition and Memory: Cognition and memory normal.        Judgment:  Judgment normal.     Comments: Insight intact     Lab Review:     Component Value Date/Time   NA 137 04/01/2021 0325   NA 141 04/10/2020 0927   K 4.2 04/01/2021 0325   CL 106 04/01/2021 0325   CO2 24 04/01/2021 0325   GLUCOSE 131 (H) 04/01/2021 0325   BUN 12 04/01/2021 0325   BUN 16 04/10/2020 0927   CREATININE 0.65 04/01/2021 0325   CREATININE 0.63 12/08/2016 1030   CALCIUM 8.6 (L) 04/01/2021 0325   PROT 7.1 03/19/2021 1436   PROT 6.9 04/10/2020 0927   ALBUMIN 4.1 03/19/2021 1436   ALBUMIN 4.7 04/10/2020  0927   AST 19 03/19/2021 1436   ALT 15 03/19/2021 1436   ALKPHOS 75 03/19/2021 1436   BILITOT 0.6 03/19/2021 1436   BILITOT 0.2 04/10/2020 0927   GFRNONAA >60 04/01/2021 0325   GFRAA 88 04/09/2019 1239       Component Value Date/Time   WBC 15.8 (H) 04/01/2021 0325   RBC 4.19 04/01/2021 0325   HGB 12.6 04/01/2021 0325   HGB 14.7 04/09/2019 1239   HCT 37.8 04/01/2021 0325   HCT 43.2 04/09/2019 1239   PLT 187 04/01/2021 0325   PLT 224 04/09/2019 1239   MCV 90.2 04/01/2021 0325   MCV 89 04/09/2019 1239   MCH 30.1 04/01/2021 0325   MCHC 33.3 04/01/2021 0325   RDW 13.7 04/01/2021 0325   RDW 12.8 04/09/2019 1239   LYMPHSABS 2.1 04/09/2019 1239   MONOABS 546 12/25/2015 1405   EOSABS 0.4 04/09/2019 1239   BASOSABS 0.1 04/09/2019 1239    No results found for: "POCLITH", "LITHIUM"   No results found for: "PHENYTOIN", "PHENOBARB", "VALPROATE", "CBMZ"   .res Assessment: Plan:    Pt seen for 30 minutes and time spent discussing response to re-start of Vyvanse. She reports that overall it has been partially effective, however she continues to notice difficulty sustaining focus and sitting still for long periods. Discussed risks and benefits of increasing dose, and pt reports that she would like to start trial of Vyvanse 50 mg po qd to further improve concentration and focus since she has been having difficulty completing tasks, such as de-cluttering and organizing.   Continue Abilify 5 mg daily for depression.  Continue Wellbutrin XL 300 mg daily for depression.  Continue Cymbalta 90 mg daily for depression and anxiety.  Pt to follow-up with this provider in 4-6 weeks or sooner if clinically indicated.  Patient advised to contact office with any questions, adverse effects, or acute worsening in signs and symptoms.   Megan Nunez was seen today for adhd and follow-up.  Diagnoses and all orders for this visit:  Attention deficit hyperactivity disorder (ADHD), predominantly inattentive type -     lisdexamfetamine (VYVANSE) 50 MG capsule; Take 1 capsule (50 mg total) by mouth daily.  Recurrent major depressive disorder, in partial remission (HCC) -     ARIPiprazole (ABILIFY) 5 MG tablet; Take 1 tablet (5 mg total) by mouth daily. -     buPROPion (WELLBUTRIN XL) 300 MG 24 hr tablet; Take 1 tablet (300 mg total) by mouth daily. -     DULoxetine (CYMBALTA) 30 MG capsule; TAKE ONE CAPSULE BY MOUTH DAILY TAKE WITH 60 MG -     DULoxetine (CYMBALTA) 60 MG capsule; Take 1 capsule (60 mg total) by mouth every morning. Take with a 30 mg capsule to equal total dose of 90 mg     Please see After Visit Summary for patient specific instructions.  Future Appointments  Date Time Provider Department Center  12/24/2021 11:00 AM Corie Chiquito, PMHNP CP-CP None  04/21/2022  8:30 AM Ronnald Nian, MD PFM-[facility]M [facility]    No orders of the defined types were placed in this encounter.   -------------------------------

## 2021-11-20 ENCOUNTER — Encounter: Payer: Self-pay | Admitting: Family Medicine

## 2021-12-02 ENCOUNTER — Ambulatory Visit: Payer: Self-pay

## 2021-12-02 NOTE — Patient Instructions (Signed)
Visit Information  Thank you for taking time to visit with me today. Please don't hesitate to contact me if I can be of assistance to you.   Following are the goals we discussed today:   Goals Addressed             This Visit's Progress    COMPLETED: Care Coordination Activities       Care Coordination Interventions: SDoH screening completed - no acute resource challenges identified at this time Discussed the patient does not have concerns with medication costs and/or adherence at this time Determined the patient does have an Advance Directive; performed chart review to confirm copy has been scanned into [employer] Education provided on the role of the care coordination team - no follow up desired at this time Instructed the patient to contact her primary care provider as needed         If you are experiencing a Mental Health or Behavioral Health Crisis or need someone to talk to, please call 1-800-273-TALK (toll free, 24 hour hotline)  Patient verbalizes understanding of instructions and care plan provided today and agrees to view in MyChart. Active MyChart status and patient understanding of how to access instructions and care plan via MyChart confirmed with patient.     No further follow up required: Please contact your primary care provider as needed.  Bevelyn Ngo, BSW, CDP Social Worker, Certified Dementia Practitioner [facility] Management  Care Coordination [phone]

## 2021-12-02 NOTE — Patient Outreach (Signed)
  Care Coordination   Initial Visit Note   12/02/2021 Name: Megan Nunez MRN: [id number] DOB: 07-Jan-1946  Megan Nunez is a 76 y.o. year old female who sees Megan Nian, MD for primary care. I spoke with  Megan Nunez by phone today.  What matters to the patients health and wellness today?  No concerns, doing well at this time    Goals Addressed             This Visit's Progress    COMPLETED: Care Coordination Activities       Care Coordination Interventions: SDoH screening completed - no acute resource challenges identified at this time Discussed the patient does not have concerns with medication costs and/or adherence at this time Determined the patient does have an Advance Directive; performed chart review to confirm copy has been scanned into [employer] Education provided on the role of the care coordination team - no follow up desired at this time Instructed the patient to contact her primary care provider as needed         SDOH assessments and interventions completed:  Yes  SDOH Interventions Today    Flowsheet Row Most Recent Value  SDOH Interventions   Food Insecurity Interventions Intervention Not Indicated  Housing Interventions Intervention Not Indicated  Transportation Interventions Intervention Not Indicated  Utilities Interventions Intervention Not Indicated        Care Coordination Interventions Activated:  Yes  Care Coordination Interventions:  Yes, provided   Follow up plan: No further intervention required.   Encounter Outcome:  Pt. Visit Completed   Megan Nunez, BSW, CDP Social Worker, Certified Dementia Practitioner [facility] Care Management  Care Coordination [phone]

## 2021-12-24 ENCOUNTER — Ambulatory Visit: Payer: Medicare Other | Admitting: Psychiatry

## 2021-12-29 ENCOUNTER — Encounter: Payer: Self-pay | Admitting: Psychiatry

## 2021-12-29 ENCOUNTER — Ambulatory Visit (INDEPENDENT_AMBULATORY_CARE_PROVIDER_SITE_OTHER): Payer: Medicare Other | Admitting: Psychiatry

## 2021-12-29 DIAGNOSIS — F900 Attention-deficit hyperactivity disorder, predominantly inattentive type: Secondary | ICD-10-CM

## 2021-12-29 DIAGNOSIS — F5081 Binge eating disorder: Secondary | ICD-10-CM

## 2021-12-29 DIAGNOSIS — F339 Major depressive disorder, recurrent, unspecified: Secondary | ICD-10-CM | POA: Diagnosis not present

## 2021-12-29 MED ORDER — AUVELITY 45-105 MG PO TBCR: EXTENDED_RELEASE_TABLET | ORAL | 0 refills | Status: DC

## 2021-12-29 MED ORDER — LISDEXAMFETAMINE DIMESYLATE 50 MG PO CAPS: 50.0000 mg | ORAL_CAPSULE | Freq: Every day | ORAL | 0 refills | Status: DC

## 2021-12-29 MED ORDER — AUVELITY 45-105 MG PO TBCR: 1.0000 | EXTENDED_RELEASE_TABLET | Freq: Two times a day (BID) | ORAL | 2 refills | Status: DC

## 2021-12-29 NOTE — Progress Notes (Signed)
Megan Nunez [id number] Mar 23, 1945 76 y.o.  Subjective:   Patient ID:  Megan Nunez is a 76 y.o. (DOB Jun 19, 1945) female.  Chief Complaint: No chief complaint on file.   HPI Megan Nunez presents to the office today for follow-up of depression, anxiety, and ADHD. She reports, "I'm not doing that great... I'm just about bored with my life. The things that need to be done, are not things I want to do." She has been staying in bed longer recently. She reports depressed mood- "I think I am more depressed than I allow myself to believe." She reports worry about her husband and herself. She also has some anxiety about politics. She reports that she needs to shred documents and file some things away. She also needs to cancel some subscriptions. Appetite is "fine. Seldom get to the point of getting hungry." Sleeping closer to 8 hours a night. "Not wanting to deal with life."  Denies SI.   She reports that she is "understanding more how I got in my own way while I was working" after learning more about ADHD. She reports that Vyvanse 50 mg may have been helpful for concentration.   She reports that she and her husband did a tour of [place] and are considering a move. She has been trying to delegate some of home maintenance and this has been difficult. Husband has had increasing difficulty doing tasks without her by his side. Husband is needing more time to complete tasks. She has been contemplating getting rid of some of her things.   She enjoys choir.   Has not seen therapist recently.   Past Psychiatric Medication Trials: Sertraline Prozac Cymbalta Wellbutrin XL Adderall Adderall XR Vyvanse     AIMS    Flowsheet Row Office Visit from 07/14/2021 in [place] Office Visit from 05/13/2021 in [place] Office Visit from 02/05/2021 in [place] Total Score 2 1 0      PHQ2-9    [place] Visit from 04/15/2021 in  [place] Visit from 03/27/2021 in [place] Visit from 02/13/2021 in [place] Visit from 05/28/2020 in [place] Visit from 04/10/2020 in [place]  PHQ-2 Total Score 0 0 0 0 1  PHQ-9 Total Score -- -- 2 0 --      [place] ED from 04/10/2021 in [facility] [facility] Admission (Discharged) from 03/31/2021 in [place] [facility] from 03/19/2021 in [place] No Risk No Risk No Risk        Review of Systems:  Review of Systems  Medications: {medication reviewed/display:3041432}  Current Outpatient Medications  Medication Sig Dispense Refill  . ARIPiprazole (ABILIFY) 5 MG tablet Take 1 tablet (5 mg total) by mouth daily. 90 tablet 1  . atorvastatin (LIPITOR) 20 MG tablet Take 1 tablet (20 mg total) by mouth daily. 90 tablet 3  . B Complex-C (B-COMPLEX WITH VITAMIN C) tablet Take 1 tablet by mouth daily.    Marland Kitchen BIOTIN PO Take 10,000 mcg by mouth daily.    Marland Kitchen buPROPion (WELLBUTRIN XL) 300 MG 24 hr tablet Take 1 tablet (300 mg total) by mouth daily. 90 tablet 1  . Cholecalciferol (VITAMIN D) 50 MCG (2000 UT) tablet Take 2,000 Units by mouth daily.    . DULoxetine (CYMBALTA) 30 MG capsule TAKE ONE CAPSULE BY MOUTH DAILY TAKE WITH 60  MG 90 capsule 1  . DULoxetine (CYMBALTA) 60 MG capsule Take 1 capsule (60 mg total) by mouth every morning. Take with a 30 mg capsule to equal total dose of 90 mg 90 capsule 1  . lisdexamfetamine (VYVANSE) 50 MG capsule Take 1 capsule (50 mg total) by mouth daily. 30 capsule 0  . Multiple Vitamins-Minerals (MULTIVITAMIN WITH MINERALS) tablet Take 1 tablet by mouth daily.    . polyvinyl alcohol (LIQUIFILM TEARS) 1.4 % ophthalmic solution Place 1 drop into both eyes as needed for dry eyes.    Marland Kitchen sennosides-docusate sodium (SENOKOT-S) 8.6-50 MG tablet Take 2  tablets by mouth daily. (Patient taking differently: Take 2 tablets by mouth every other day.) 30 tablet 1  . Turmeric 500 MG CAPS Take 500 mg by mouth daily.     No current facility-administered medications for this visit.    Medication Side Effects: {Medication Side Effects (Optional):21014029}  Allergies:  Allergies  Allergen Reactions  . Nickel Itching and Rash    Past Medical History:  Diagnosis Date  . Anxiety   . Arthritis   . Depression   . Family history of adverse reaction to anesthesia 2012   Mother has cardiac  arrest after surgery  . Hepatitis    high antibodies for hepatitis  . Obesity     Past Medical History, Surgical history, Social history, and Family history were reviewed and updated as appropriate.   Please see review of systems for further details on the patient's review from today.   Objective:   Physical Exam:  There were no vitals taken for this visit.  Physical Exam  Lab Review:     Component Value Date/Time   NA 137 04/01/2021 0325   NA 141 04/10/2020 0927   K 4.2 04/01/2021 0325   CL 106 04/01/2021 0325   CO2 24 04/01/2021 0325   GLUCOSE 131 (H) 04/01/2021 0325   BUN 12 04/01/2021 0325   BUN 16 04/10/2020 0927   CREATININE 0.65 04/01/2021 0325   CREATININE 0.63 12/08/2016 1030   CALCIUM 8.6 (L) 04/01/2021 0325   PROT 7.1 03/19/2021 1436   PROT 6.9 04/10/2020 0927   ALBUMIN 4.1 03/19/2021 1436   ALBUMIN 4.7 04/10/2020 0927   AST 19 03/19/2021 1436   ALT 15 03/19/2021 1436   ALKPHOS 75 03/19/2021 1436   BILITOT 0.6 03/19/2021 1436   BILITOT 0.2 04/10/2020 0927   GFRNONAA >60 04/01/2021 0325   GFRAA 88 04/09/2019 1239       Component Value Date/Time   WBC 15.8 (H) 04/01/2021 0325   RBC 4.19 04/01/2021 0325   HGB 12.6 04/01/2021 0325   HGB 14.7 04/09/2019 1239   HCT 37.8 04/01/2021 0325   HCT 43.2 04/09/2019 1239   PLT 187 04/01/2021 0325   PLT 224 04/09/2019 1239   MCV 90.2 04/01/2021 0325   MCV 89 04/09/2019 1239    MCH 30.1 04/01/2021 0325   MCHC 33.3 04/01/2021 0325   RDW 13.7 04/01/2021 0325   RDW 12.8 04/09/2019 1239   LYMPHSABS 2.1 04/09/2019 1239   MONOABS 546 12/25/2015 1405   EOSABS 0.4 04/09/2019 1239   BASOSABS 0.1 04/09/2019 1239    No results found for: "POCLITH", "LITHIUM"   No results found for: "PHENYTOIN", "PHENOBARB", "VALPROATE", "CBMZ"   .res Assessment: Plan:    There are no diagnoses linked to this encounter.   Please see After Visit Summary for patient specific instructions.  Future Appointments  Date Time Provider Department Center  04/21/2022  8:30  AM Denita Lung, MD [facility]    No orders of the defined types were placed in this encounter.   -------------------------------

## 2022-02-09 ENCOUNTER — Ambulatory Visit: Payer: Medicare Other | Admitting: Psychiatry

## 2022-02-17 ENCOUNTER — Encounter: Payer: Self-pay | Admitting: Psychiatry

## 2022-02-17 ENCOUNTER — Ambulatory Visit: Payer: Medicare Other | Admitting: Psychiatry

## 2022-02-17 DIAGNOSIS — F900 Attention-deficit hyperactivity disorder, predominantly inattentive type: Secondary | ICD-10-CM

## 2022-02-17 DIAGNOSIS — F50819 Binge eating disorder, unspecified: Secondary | ICD-10-CM

## 2022-02-17 DIAGNOSIS — F5081 Binge eating disorder: Secondary | ICD-10-CM | POA: Diagnosis not present

## 2022-02-17 DIAGNOSIS — F3341 Major depressive disorder, recurrent, in partial remission: Secondary | ICD-10-CM | POA: Diagnosis not present

## 2022-02-17 MED ORDER — LISDEXAMFETAMINE DIMESYLATE 50 MG PO CAPS: 50.0000 mg | ORAL_CAPSULE | Freq: Every day | ORAL | 0 refills | Status: DC

## 2022-02-17 NOTE — Progress Notes (Signed)
Megan Nunez [id number] 01/22/1945 77 y.o.  Subjective:   Patient ID:  Megan Nunez is a 77 y.o. (DOB December 11, 1945) female.  Chief Complaint:  Chief Complaint  Patient presents with   Follow-up    Depression, anxiety, and ADHD    HPI FLORINE Nunez presents to the office today for follow-up of depression, anxiety, and ADHD. She reports that she is feeling "better." She reports that she has been taking Auvelity once a day since she has difficulty remembering the second dose.   "I really feel clearer." She reports that depression is "not bothering me." She reports that she is better able to do things. Denies any recent anxiety. She reports, "there are things I am worried about, but practical things." She would like to start to de-clutter. Sleeping "fairly well." She reports that she has had 6 lb intentional weight loss. She reports that she has been trying to avoid eating when she is not hungry.  She notices some foods are difficult for her to resist. Concentration is improved with Vyvanse. Denies SI.   Recently went on a trip with friends and enjoyed this.   Vyvanse last filled 12/18/21.  Past Psychiatric Medication Trials: Sertraline Prozac Cymbalta Wellbutrin XL Abilify Adderall Adderall XR Vyvanse  AIMS    Flowsheet Row Office Visit from 12/29/2021 in [facility] Office Visit from 07/14/2021 in [facility] Office Visit from 05/13/2021 in [facility] Office Visit from 02/05/2021 in [place] Total Score 0 2 1 0      PHQ2-9    [place] Visit from 04/15/2021 in [place] Visit from 03/27/2021 in [place] Visit from 02/13/2021 in [place] Visit from 05/28/2020 in [place] Visit from 04/10/2020 in [place]  PHQ-2 Total Score 0 0 0 0 1  PHQ-9 Total  Score -- -- 2 0 --      [place] [facility] from 04/10/2021 in [facility] at [facility] Admission (Discharged) from 03/31/2021 in [place] [facility] from 03/19/2021 in [place] No Risk No Risk No Risk        Review of Systems:  Review of Systems  Musculoskeletal:  Negative for gait problem.  Neurological:  Negative for tremors.  Psychiatric/Behavioral:         Please refer to HPI    Medications: I have reviewed the patient's current medications.  Current Outpatient Medications  Medication Sig Dispense Refill   [START ON 04/14/2022] lisdexamfetamine (VYVANSE) 50 MG capsule Take 1 capsule (50 mg total) by mouth daily. 30 capsule 0   [START ON 03/17/2022] lisdexamfetamine (VYVANSE) 50 MG capsule Take 1 capsule (50 mg total) by mouth daily. 30 capsule 0   ARIPiprazole (ABILIFY) 5 MG tablet Take 1 tablet (5 mg total) by mouth daily. 90 tablet 1   atorvastatin (LIPITOR) 20 MG tablet Take 1 tablet (20 mg total) by mouth daily. 90 tablet 3   B Complex-C (B-COMPLEX WITH VITAMIN C) tablet Take 1 tablet by mouth daily.     BIOTIN PO Take 10,000 mcg by mouth daily.     calcium carbonate (OSCAL) 1500 (600 Ca) MG TABS tablet Take by mouth 2 (two) times daily with a meal.     Cholecalciferol (VITAMIN D) 50 MCG (2000 UT) tablet Take 2,000 Units by mouth daily.  Dextromethorphan-buPROPion ER (AUVELITY) 45-105 MG TBCR Take 1 tablet daily for 3 days, then increase to 1 tablet twice daily 90 tablet 0   Dextromethorphan-buPROPion ER (AUVELITY) 45-105 MG TBCR Take 1 tablet by mouth 2 (two) times daily. 60 tablet 2   DULoxetine (CYMBALTA) 30 MG capsule TAKE ONE CAPSULE BY MOUTH DAILY TAKE WITH 60 MG 90 capsule 1   DULoxetine (CYMBALTA) 60 MG capsule Take 1 capsule (60 mg total) by mouth every morning. Take with a 30 mg capsule to equal total dose of 90 mg 90 capsule 1    lisdexamfetamine (VYVANSE) 50 MG capsule Take 1 capsule (50 mg total) by mouth daily. 30 capsule 0   Multiple Vitamins-Minerals (MULTIVITAMIN WITH MINERALS) tablet Take 1 tablet by mouth daily.     polyvinyl alcohol (LIQUIFILM TEARS) 1.4 % ophthalmic solution Place 1 drop into both eyes as needed for dry eyes.     sennosides-docusate sodium (SENOKOT-S) 8.6-50 MG tablet Take 2 tablets by mouth daily. (Patient taking differently: Take 2 tablets by mouth every other day.) 30 tablet 1   Turmeric 500 MG CAPS Take 500 mg by mouth daily.     No current facility-administered medications for this visit.    Medication Side Effects: None  Allergies:  Allergies  Allergen Reactions   Nickel Itching and Rash    Past Medical History:  Diagnosis Date   Anxiety    Arthritis    Depression    Family history of adverse reaction to anesthesia 2012   Mother has cardiac  arrest after surgery   Hepatitis    high antibodies for hepatitis   Obesity     Past Medical History, Surgical history, Social history, and Family history were reviewed and updated as appropriate.   Please see review of systems for further details on the patient's review from today.   Objective:   Physical Exam:  There were no vitals taken for this visit.  Physical Exam Constitutional:      General: She is not in acute distress. Musculoskeletal:        General: No deformity.  Neurological:     Mental Status: She is alert and oriented to person, place, and time.     Coordination: Coordination normal.  Psychiatric:        Attention and Perception: Attention and perception normal. She does not perceive auditory or visual hallucinations.        Mood and Affect: Mood normal. Mood is not anxious or depressed. Affect is not labile, blunt, angry or inappropriate.        Speech: Speech normal.        Behavior: Behavior normal.        Thought Content: Thought content normal. Thought content is not paranoid or delusional. Thought  content does not include homicidal or suicidal ideation. Thought content does not include homicidal or suicidal plan.        Cognition and Memory: Cognition and memory normal.        Judgment: Judgment normal.     Comments: Insight intact     Lab Review:     Component Value Date/Time   NA 137 04/01/2021 0325   NA 141 04/10/2020 0927   K 4.2 04/01/2021 0325   CL 106 04/01/2021 0325   CO2 24 04/01/2021 0325   GLUCOSE 131 (H) 04/01/2021 0325   BUN 12 04/01/2021 0325   BUN 16 04/10/2020 0927   CREATININE 0.65 04/01/2021 0325   CREATININE 0.63 12/08/2016 1030   CALCIUM 8.6 (  L) 04/01/2021 0325   PROT 7.1 03/19/2021 1436   PROT 6.9 04/10/2020 0927   ALBUMIN 4.1 03/19/2021 1436   ALBUMIN 4.7 04/10/2020 0927   AST 19 03/19/2021 1436   ALT 15 03/19/2021 1436   ALKPHOS 75 03/19/2021 1436   BILITOT 0.6 03/19/2021 1436   BILITOT 0.2 04/10/2020 0927   GFRNONAA >60 04/01/2021 0325   GFRAA 88 04/09/2019 1239       Component Value Date/Time   WBC 15.8 (H) 04/01/2021 0325   RBC 4.19 04/01/2021 0325   HGB 12.6 04/01/2021 0325   HGB 14.7 04/09/2019 1239   HCT 37.8 04/01/2021 0325   HCT 43.2 04/09/2019 1239   PLT 187 04/01/2021 0325   PLT 224 04/09/2019 1239   MCV 90.2 04/01/2021 0325   MCV 89 04/09/2019 1239   MCH 30.1 04/01/2021 0325   MCHC 33.3 04/01/2021 0325   RDW 13.7 04/01/2021 0325   RDW 12.8 04/09/2019 1239   LYMPHSABS 2.1 04/09/2019 1239   MONOABS 546 12/25/2015 1405   EOSABS 0.4 04/09/2019 1239   BASOSABS 0.1 04/09/2019 1239    No results found for: "POCLITH", "LITHIUM"   No results found for: "PHENYTOIN", "PHENOBARB", "VALPROATE", "CBMZ"   .res Assessment: Plan:    Pt seen for 30 minutes and time spent discussing her question about the indications and proposed mechanism of action for her medications. She reports that she would like to continue current medications without changes at this time since her symptoms are currently well-controlled and she denies side  effects. Discussed possible med reductions in the future, to include possibly reducing Abilify from 5 mg po qd to 2 mg po qd now that depressive s/s are better controlled with Auvelity. She reports that she would prefer to continue Abilify 5 mg po qd at this time since she had worsening depression in the past when she went off abilify.  Discussed continuing Cymbalta 90 mg po qd for anxiety and depression since she experienced a significant improvement in depression and anxiety in the past when Cymbalta was increased to 90 mg daily.  Will continue Vyvanse 50 mg po qd since pt reports that this dose is significantly more effective than the 40 mg dose.  Will continue Auvelity 45-105 mg one tablet twice daily for depression.  Recommend continuing psychotherapy.  Pt to follow-up in 3 months or sooner if clinically indicated.  Patient advised to contact office with any questions, adverse effects, or acute worsening in signs and symptoms.   Chantella was seen today for follow-up.  Diagnoses and all orders for this visit:  Attention deficit hyperactivity disorder (ADHD), predominantly inattentive type -     lisdexamfetamine (VYVANSE) 50 MG capsule; Take 1 capsule (50 mg total) by mouth daily. -     lisdexamfetamine (VYVANSE) 50 MG capsule; Take 1 capsule (50 mg total) by mouth daily.  Binge eating disorder -     lisdexamfetamine (VYVANSE) 50 MG capsule; Take 1 capsule (50 mg total) by mouth daily. -     lisdexamfetamine (VYVANSE) 50 MG capsule; Take 1 capsule (50 mg total) by mouth daily.  Recurrent major depressive disorder, in partial remission ([place])     Please see After Visit Summary for patient specific instructions.  Future Appointments  Date Time Provider [place]  04/21/2022  8:30 AM Denita Lung, MD [facility] [place]  05/18/2022 11:30 AM Thayer Headings, PMHNP CP[facility]CP None    No orders of the defined types were placed in this encounter.   -------------------------------

## 2022-04-08 ENCOUNTER — Ambulatory Visit (INDEPENDENT_AMBULATORY_CARE_PROVIDER_SITE_OTHER): Payer: Medicare Other | Admitting: Family Medicine

## 2022-04-08 ENCOUNTER — Encounter: Payer: Self-pay | Admitting: Family Medicine

## 2022-04-08 VITALS — BP 104/62 | HR 74 | Temp 98.0°F | Resp 16 | Wt 152.6 lb

## 2022-04-08 DIAGNOSIS — H6121 Impacted cerumen, right ear: Secondary | ICD-10-CM

## 2022-04-08 NOTE — Progress Notes (Signed)
   Subjective:    Patient ID: Megan Nunez, female    DOB: 07-Apr-1945, 77 y.o.   MRN: [id number]  HPI She is here for removal of cerumen.  She has had difficulty listening from her right ear.   Review of Systems     Objective:   Physical Exam  Cerumen present in the right ear that was easily lavaged out.  Both TMs and canals are normal.      Assessment & Plan:  Impacted cerumen of right ear

## 2022-04-20 DIAGNOSIS — E785 Hyperlipidemia, unspecified: Secondary | ICD-10-CM | POA: Insufficient documentation

## 2022-04-20 IMAGING — CT CT SHOULDER*R* W/O CM
3 of 4 series · 15 of 33 positions shown, 18 images · non-contrast
Comparison: Right shoulder radiographs 01/24/2018

CLINICAL DATA: Bilateral shoulder pain. Preoperative evaluation. No
history of surgery.

EXAM:
CT OF THE UPPER RIGHT EXTREMITY WITHOUT CONTRAST
TECHNIQUE: Multidetector CT imaging of the upper right extremity was performed
according to the standard protocol.
RADIATION DOSE REDUCTION: This exam was performed according to the
departmental dose-optimization program which includes automated
exposure control, adjustment of the mA and/or kV according to
patient size and/or use of iterative reconstruction technique.

[Series 7: shoulder 2.00 br40 s3 cor soft · coronal · 0.46mm/px · 3 of 109 slices shown]
[im 29/109  bone]
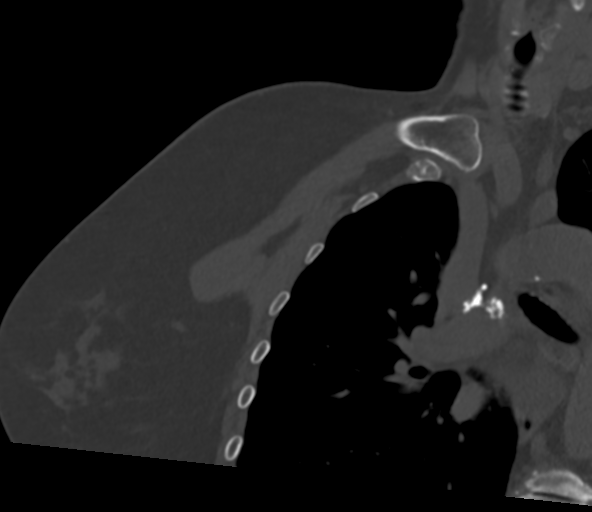
[im 46/109  bone]
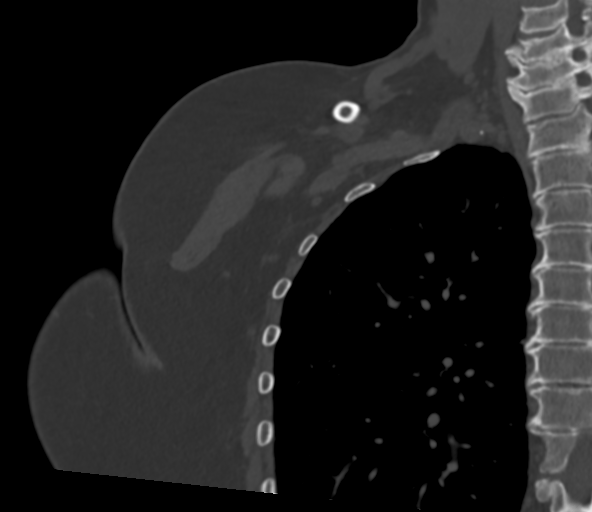
[im 63/109  bone]
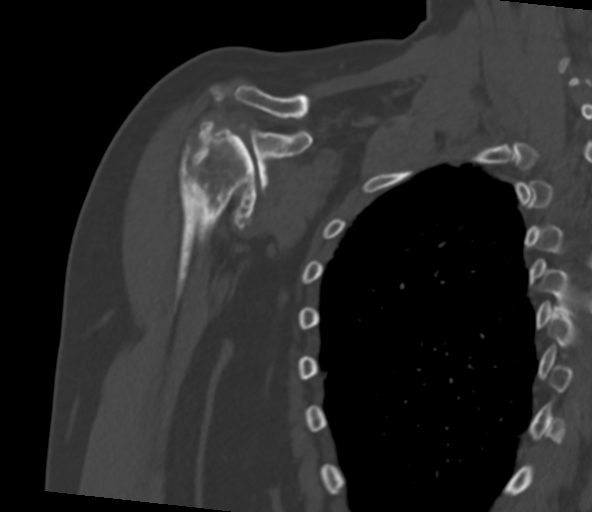

[Series 11: shoulder 2.00 br40 s3 sag soft · sagittal · 0.44mm/px · 5 of 128 slices shown, 6 images]
[im 43/128  bone]
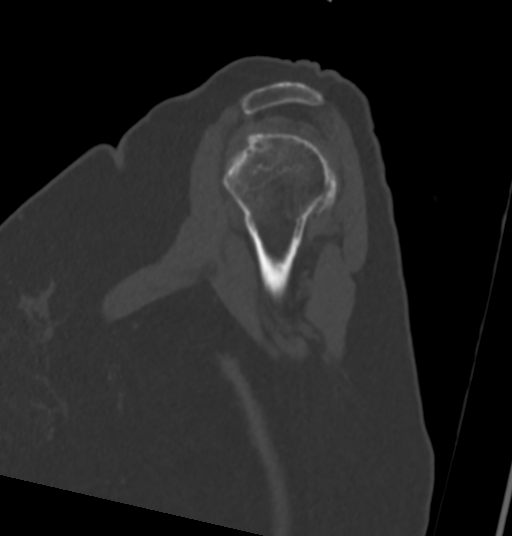
[im 53/128  bone]
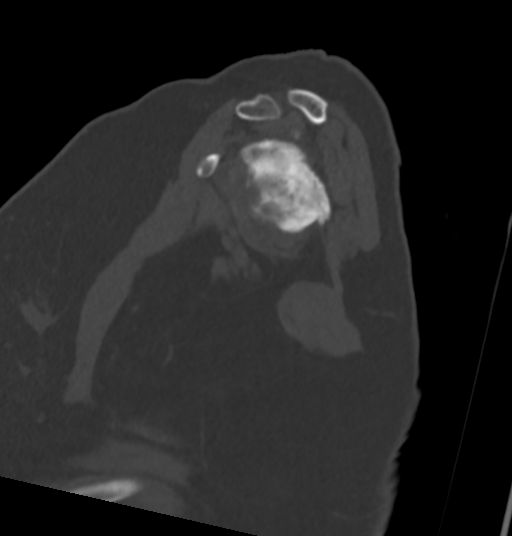
[im 64/128  soft-tissue]
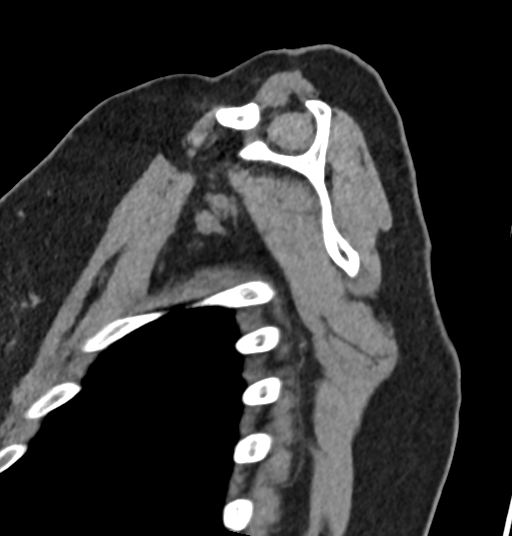
[im 64/128  bone]
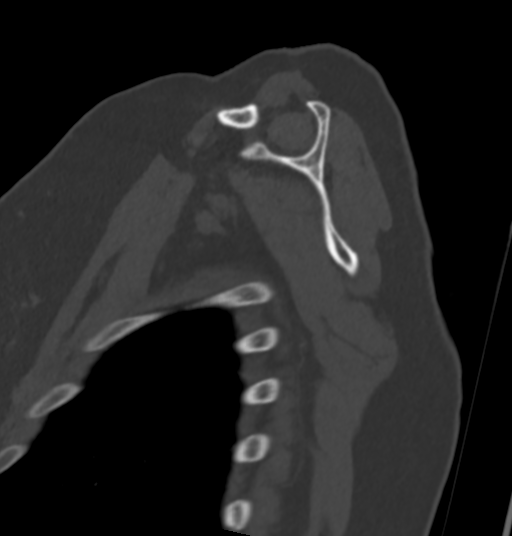
[im 75/128  bone]
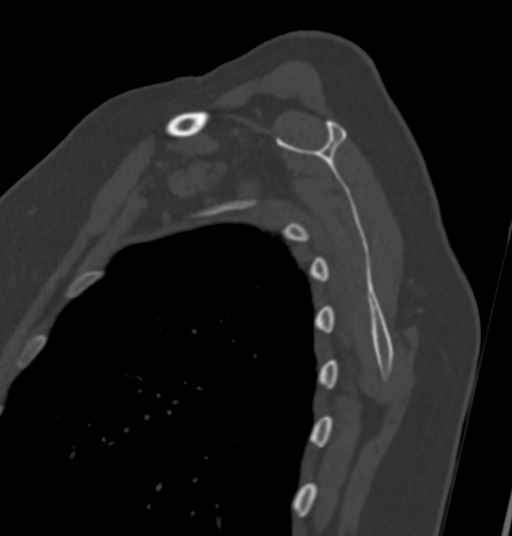
[im 85/128  bone]
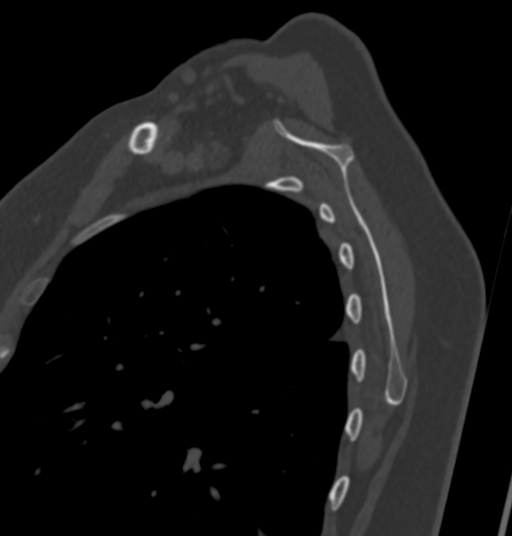

[Series 15: shoulder 0.60 br40 s3 thins soft · axial · 0.54mm/px · z∈[-1084,-897]mm · 7 of 390 slices shown, 9 images]
[im 39/390  soft-tissue]
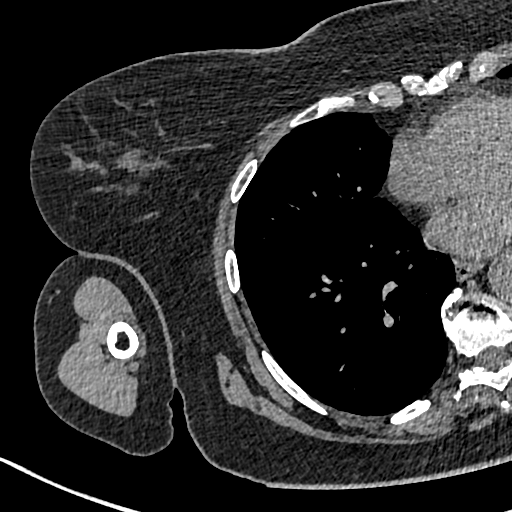
[im 39/390  bone]
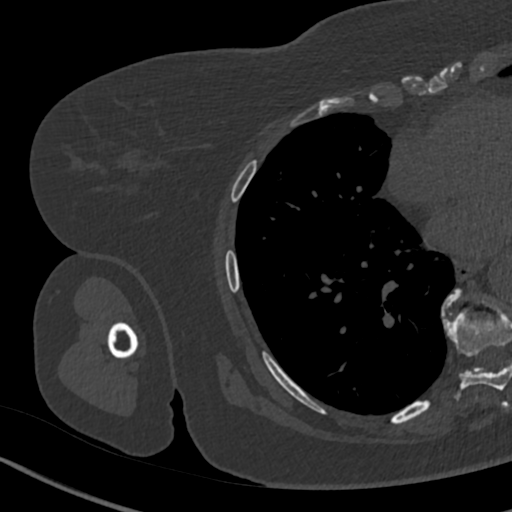
[im 78/390  bone]
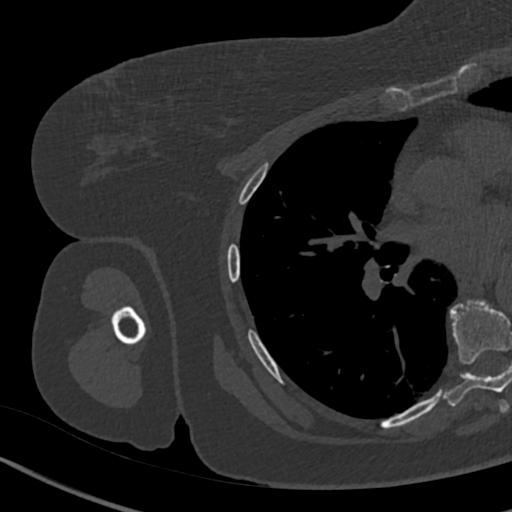
[im 156/390  bone]
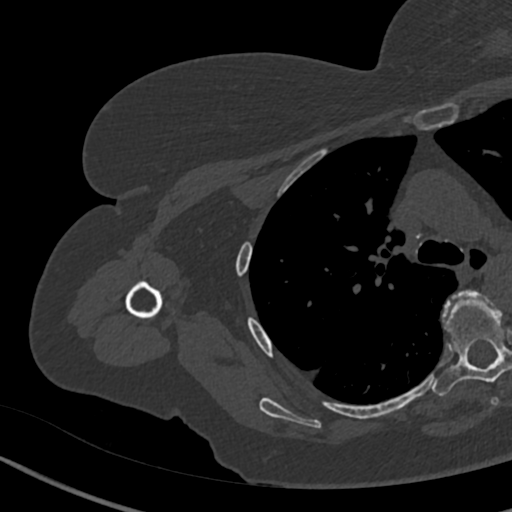
[im 195/390  bone]
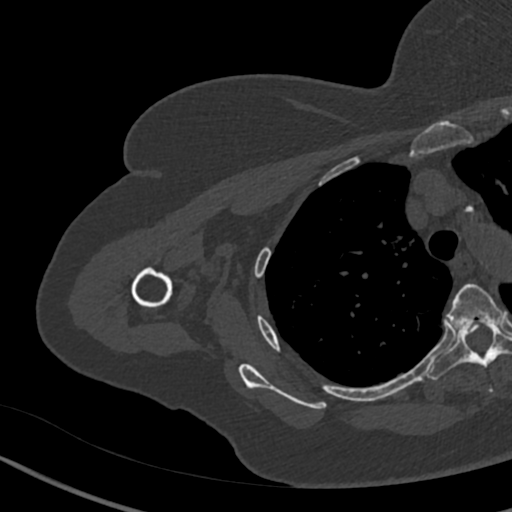
[im 234/390  soft-tissue]
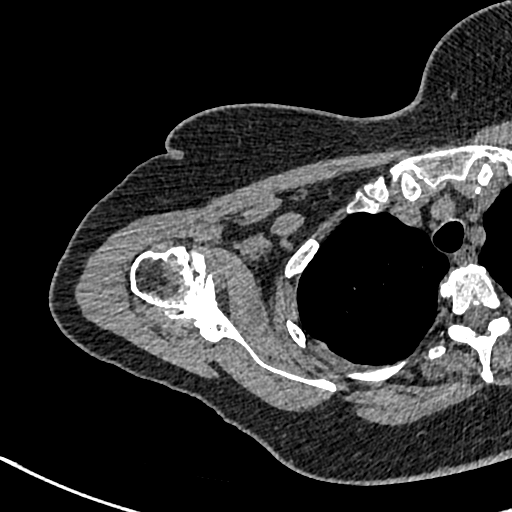
[im 234/390  bone]
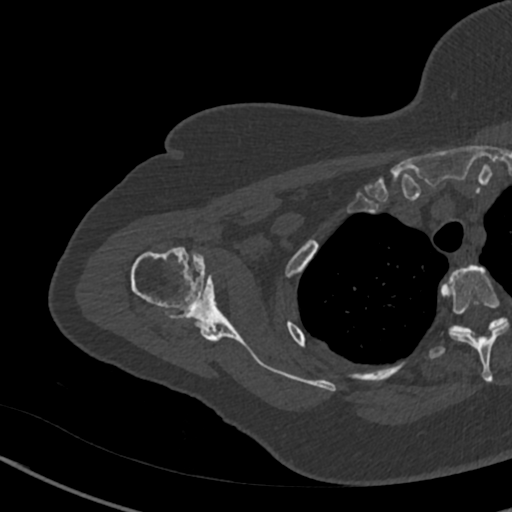
[im 312/390  bone]
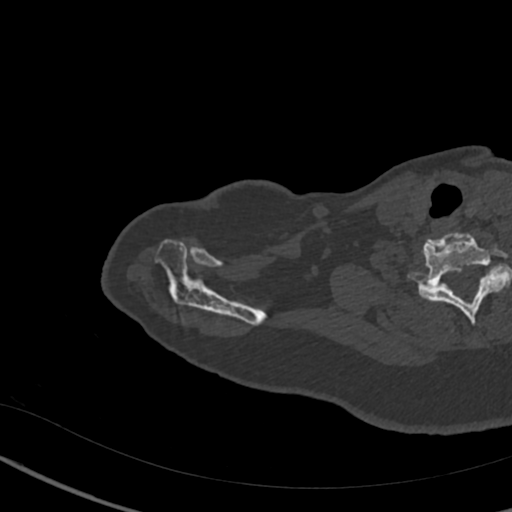
[im 351/390  bone]
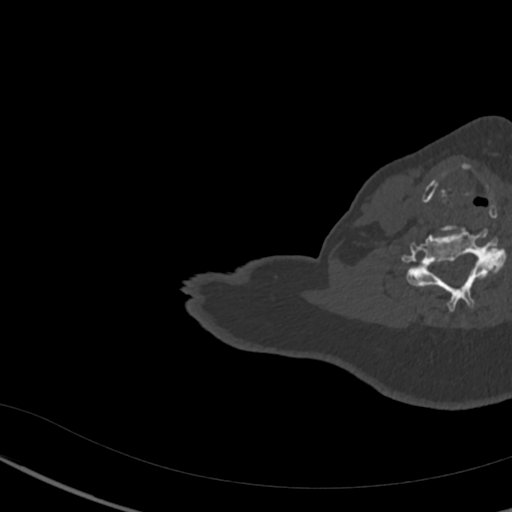

[15 of 33 positions shown; findings below may reference images not displayed]

FINDINGS: Bones/Joint/Cartilage

Severe glenohumeral joint space narrowing with bone-on-bone contact,
large inferior humeral head-neck junction degenerative osteophytosis
diffuse subchondral glenoid and humeral head sclerosis, diffuse
humeral head subchondral degenerative cystic changes, and moderate
cortical flattening/remodeling of the medial humeral head and the
glenoid fossa. There is a well corticated chronic loose body within
the superior glenohumeral joint measuring up to 1.8 cm (axial series
5 image 33, coronal series 9 image 75). Anterior to this, there is a
tiny 3 mm loose body (axial image 33).

Mild acromioclavicular joint space narrowing and peripheral
osteophytosis.

No acute fracture.

Ligaments

Suboptimally assessed by CT.

Muscles and Tendons

Moderate supraspinatus muscle atrophy.

Soft tissues

No axillary lymphadenopathy. Moderate calcifications within the
aortic arch. There also multiple likely calcified right paratracheal
and right hilar lymph nodes, likely the sequela of remote
granulomatous infection.

Mild curvilinear scarring within the posterior aspect of the
visualized mid to upper right lung.
IMPRESSION: 1. Severe right glenohumeral osteoarthritis.
2. Mild right acromioclavicular osteoarthritis.

Aortic Atherosclerosis (4374Z-Y5C.C).

## 2022-04-20 IMAGING — CT CT SHOULDER*L* W/O CM
3 of 5 series · 15 of 33 positions shown, 17 images · non-contrast
Comparison: None.

CLINICAL DATA: Bilateral shoulder pain.  No history of surgery.

EXAM:
CT OF THE UPPER LEFT EXTREMITY WITHOUT CONTRAST
TECHNIQUE: Multidetector CT imaging of the upper left extremity was performed
according to the standard protocol.
RADIATION DOSE REDUCTION: This exam was performed according to the
departmental dose-optimization program which includes automated
exposure control, adjustment of the mA and/or kV according to
patient size and/or use of iterative reconstruction technique.

[Series 16: shoulder 2.00 br40 s3 cor soft · coronal · 0.41mm/px · 3 of 120 slices shown]
[im 24/120  bone]
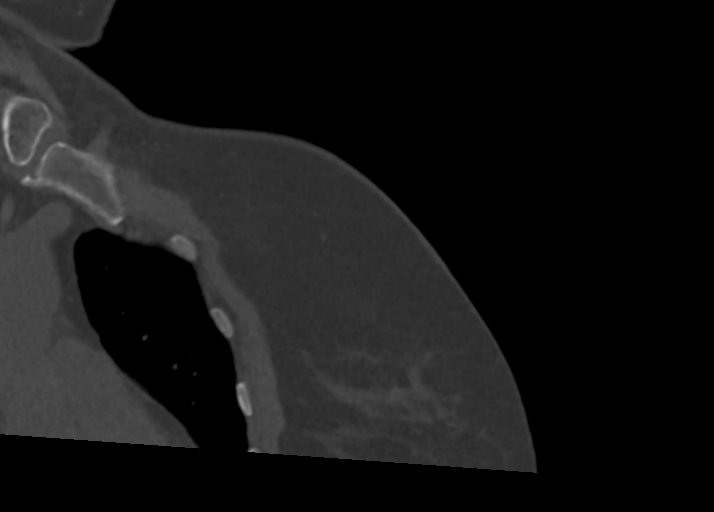
[im 48/120  bone]
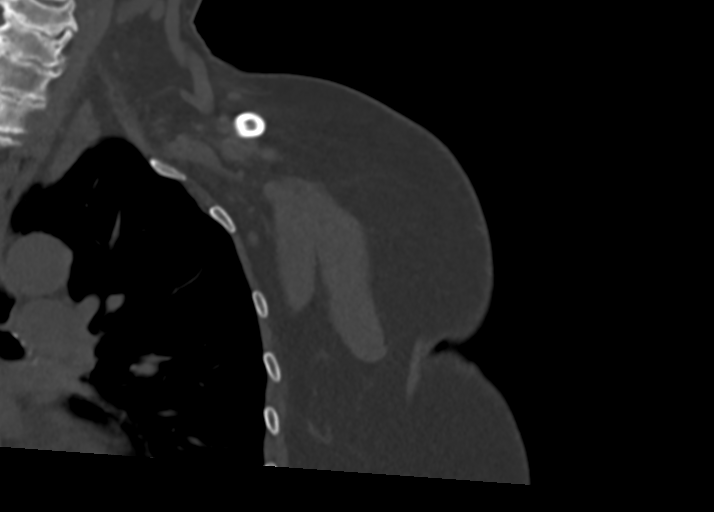
[im 72/120  bone]
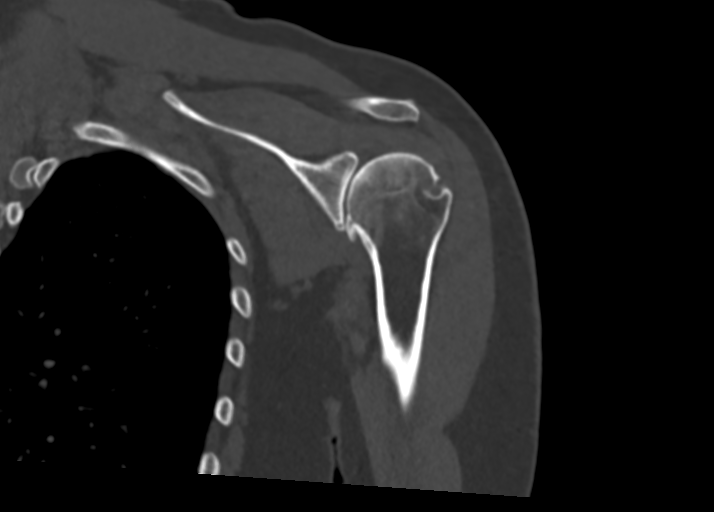

[Series 20: shoulder 2.00 br40 s3 sag soft · sagittal · 0.43mm/px · 5 of 130 slices shown]
[im 22/130  bone]
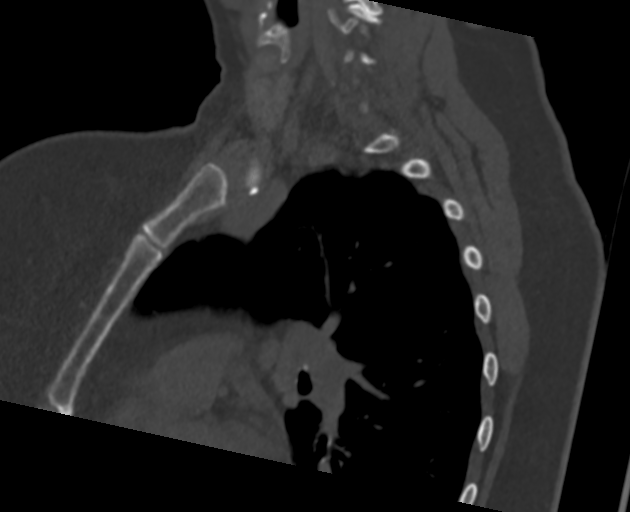
[im 44/130  bone]
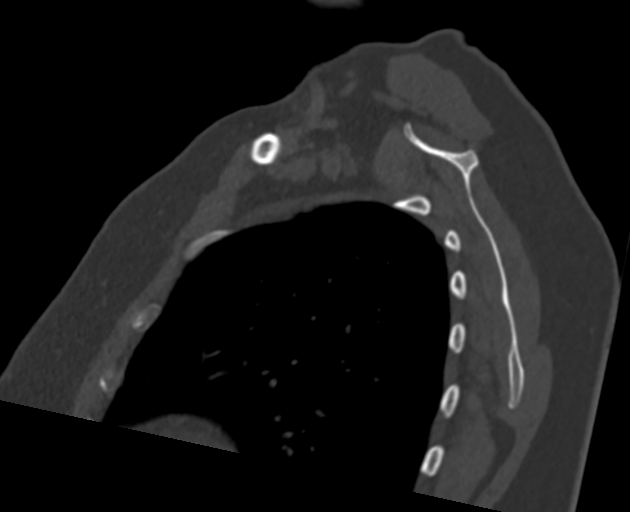
[im 65/130  bone]
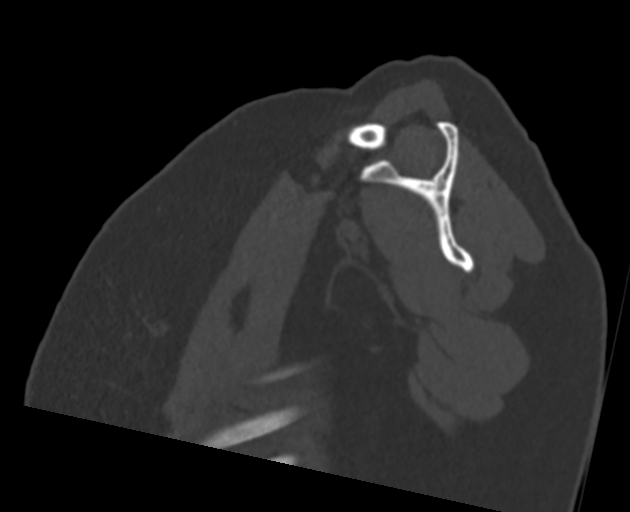
[im 87/130  bone]
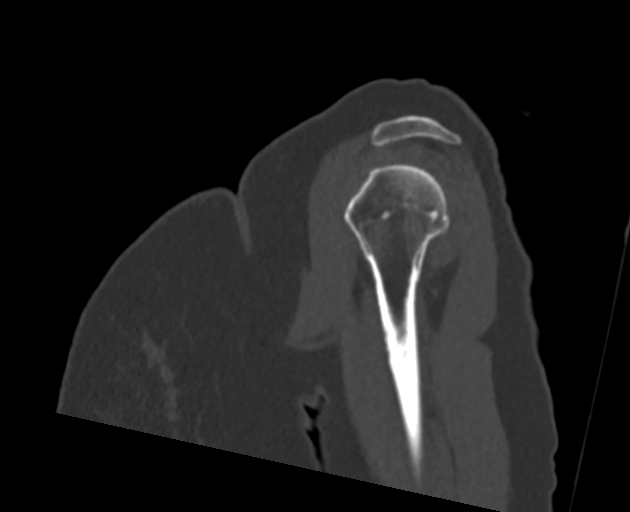
[im 108/130  bone]
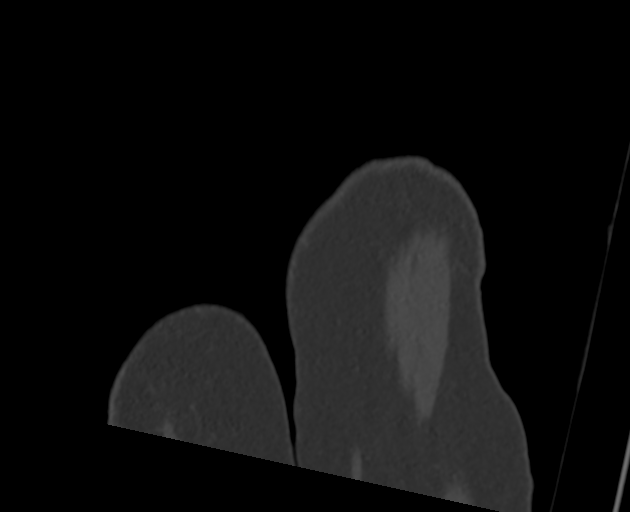

[Series 24: shoulder 0.60 br40 s3 thins soft · axial · 0.58mm/px · z∈[-1055,-896]mm · 7 of 333 slices shown, 9 images]
[im 34/333  soft-tissue]
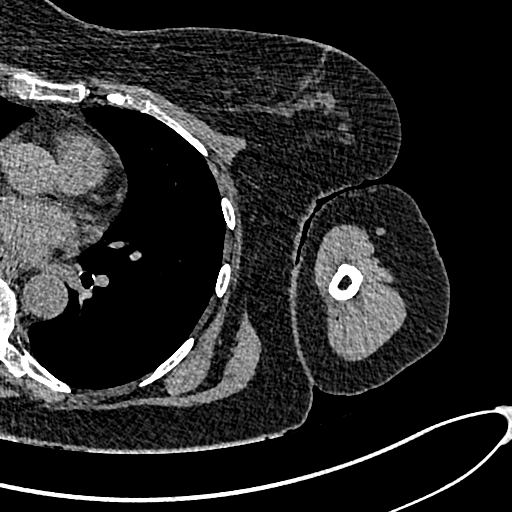
[im 34/333  bone]
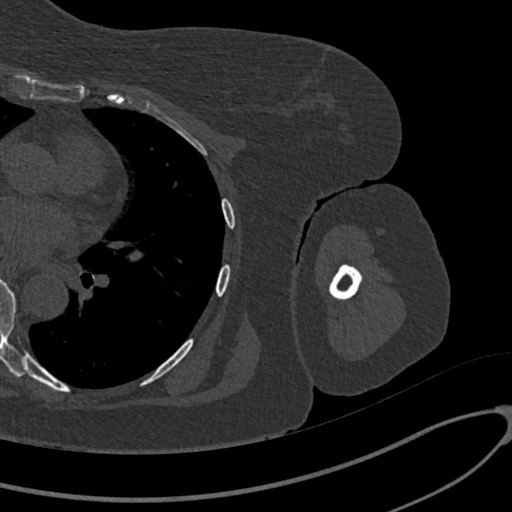
[im 67/333  bone]
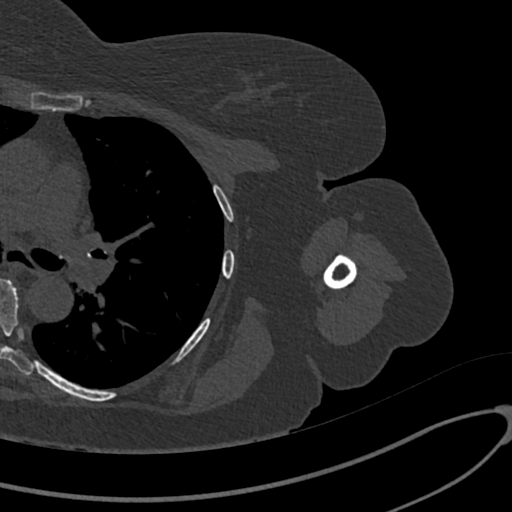
[im 133/333  bone]
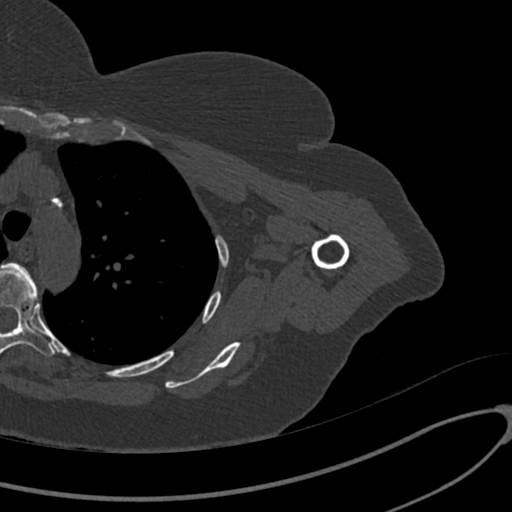
[im 167/333  bone]
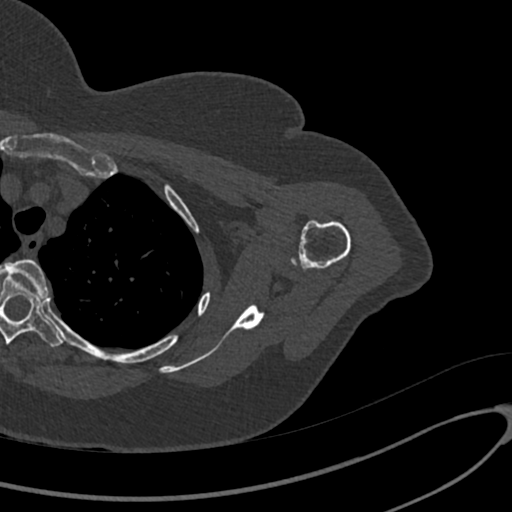
[im 200/333  soft-tissue]
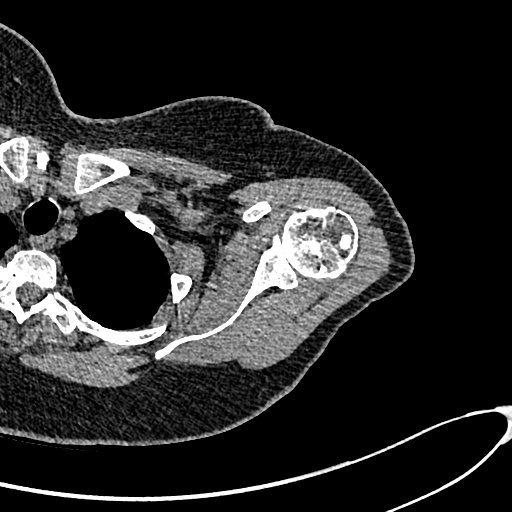
[im 200/333  bone]
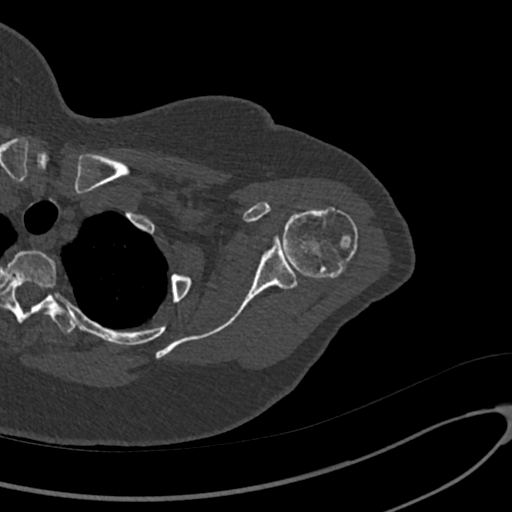
[im 266/333  bone]
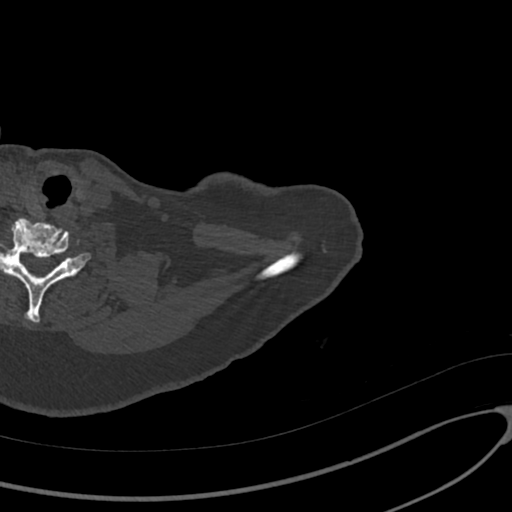
[im 299/333  bone]
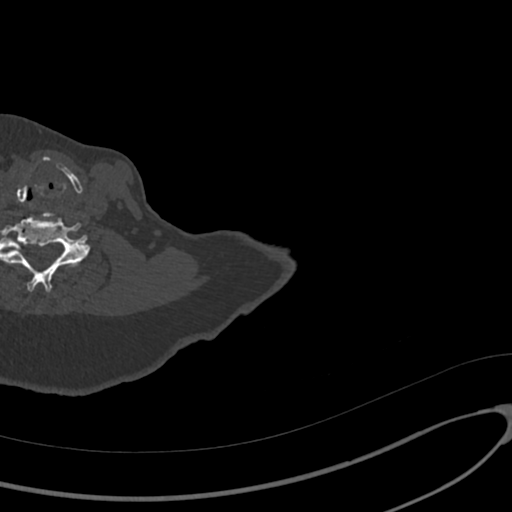

[15 of 33 positions shown; findings below may reference images not displayed]

FINDINGS: Bones/Joint/Cartilage

Severe left glenohumeral joint space narrowing. Moderate inferior
humeral head-neck junction and mild-to-moderate peripheral glenoid
degenerative osteophytes. Mild subchondral sclerosis.

Mild acromioclavicular joint space narrowing and peripheral
osteophytosis.

No acute fracture.

Moderate to severe multilevel degenerative disc changes of the
visualized cervical and thoracic spine.

Ligaments

Suboptimally assessed by CT.

Muscles and Tendons

The rotator cuff muscles demonstrate normal signal and density.

Soft tissues

No left axillary lymphadenopathy.

Mild calcification within aortic arch.

The visualized portion of the left upper lung is unremarkable.
IMPRESSION: :
IMPRESSION: 1. Severe left glenohumeral osteoarthritis although less severe than
the contralateral shoulder imaged the same day.
2. Mild left acromioclavicular osteoarthritis.

## 2022-04-21 ENCOUNTER — Encounter: Payer: Self-pay | Admitting: Family Medicine

## 2022-04-21 ENCOUNTER — Ambulatory Visit (INDEPENDENT_AMBULATORY_CARE_PROVIDER_SITE_OTHER): Payer: Medicare Other | Admitting: Family Medicine

## 2022-04-21 VITALS — BP 122/64 | HR 67 | Temp 98.0°F | Ht 59.75 in | Wt 151.8 lb

## 2022-04-21 DIAGNOSIS — F330 Major depressive disorder, recurrent, mild: Secondary | ICD-10-CM

## 2022-04-21 DIAGNOSIS — R251 Tremor, unspecified: Secondary | ICD-10-CM | POA: Diagnosis not present

## 2022-04-21 DIAGNOSIS — M8580 Other specified disorders of bone density and structure, unspecified site: Secondary | ICD-10-CM | POA: Diagnosis not present

## 2022-04-21 DIAGNOSIS — F900 Attention-deficit hyperactivity disorder, predominantly inattentive type: Secondary | ICD-10-CM | POA: Diagnosis not present

## 2022-04-21 DIAGNOSIS — Z0000 Encounter for general adult medical examination without abnormal findings: Secondary | ICD-10-CM

## 2022-04-21 DIAGNOSIS — L659 Nonscarring hair loss, unspecified: Secondary | ICD-10-CM | POA: Diagnosis not present

## 2022-04-21 DIAGNOSIS — M1711 Unilateral primary osteoarthritis, right knee: Secondary | ICD-10-CM

## 2022-04-21 DIAGNOSIS — M1990 Unspecified osteoarthritis, unspecified site: Secondary | ICD-10-CM

## 2022-04-21 DIAGNOSIS — N3946 Mixed incontinence: Secondary | ICD-10-CM | POA: Diagnosis not present

## 2022-04-21 DIAGNOSIS — E785 Hyperlipidemia, unspecified: Secondary | ICD-10-CM

## 2022-04-21 DIAGNOSIS — H9313 Tinnitus, bilateral: Secondary | ICD-10-CM | POA: Diagnosis not present

## 2022-04-21 DIAGNOSIS — I700 Atherosclerosis of aorta: Secondary | ICD-10-CM | POA: Insufficient documentation

## 2022-04-21 LAB — COMPREHENSIVE METABOLIC PANEL
ALT: 12 IU/L (ref 0–32)
AST: 15 IU/L (ref 0–40)
Albumin/Globulin Ratio: 2.1 (ref 1.2–2.2)
Albumin: 4.4 g/dL (ref 3.8–4.8)
Alkaline Phosphatase: 83 IU/L (ref 44–121)
BUN/Creatinine Ratio: 16 (ref 12–28)
BUN: 12 mg/dL (ref 8–27)
Bilirubin Total: 0.4 mg/dL (ref 0.0–1.2)
CO2: 21 mmol/L (ref 20–29)
Calcium: 9.3 mg/dL (ref 8.7–10.3)
Chloride: 103 mmol/L (ref 96–106)
Creatinine, Ser: 0.75 mg/dL (ref 0.57–1.00)
Globulin, Total: 2.1 g/dL (ref 1.5–4.5)
Glucose: 103 mg/dL — ABNORMAL HIGH (ref 70–99)
Potassium: 5 mmol/L (ref 3.5–5.2)
Sodium: 142 mmol/L (ref 134–144)
Total Protein: 6.5 g/dL (ref 6.0–8.5)
eGFR: 83 mL/min/{1.73_m2} (ref >59)

## 2022-04-21 LAB — CBC WITH DIFFERENTIAL/PLATELET
Basophils Absolute: 0.1 10*3/uL (ref 0.0–0.2)
Basos: 1 %
EOS (ABSOLUTE): 0.1 10*3/uL (ref 0.0–0.4)
Eos: 2 %
Hematocrit: 43.7 % (ref 34.0–46.6)
Hemoglobin: 14.5 g/dL (ref 11.1–15.9)
Immature Grans (Abs): 0 10*3/uL (ref 0.0–0.1)
Immature Granulocytes: 0 %
Lymphocytes Absolute: 1.8 10*3/uL (ref 0.7–3.1)
Lymphs: 24 %
MCH: 30.3 pg (ref 26.6–33.0)
MCHC: 33.2 g/dL (ref 31.5–35.7)
MCV: 91 fL (ref 79–97)
Monocytes Absolute: 0.5 10*3/uL (ref 0.1–0.9)
Monocytes: 7 %
Neutrophils Absolute: 5 10*3/uL (ref 1.4–7.0)
Neutrophils: 66 %
Platelets: 216 10*3/uL (ref 150–450)
RBC: 4.79 x10E6/uL (ref 3.77–5.28)
RDW: 12.6 % (ref 11.7–15.4)
WBC: 7.6 10*3/uL (ref 3.4–10.8)

## 2022-04-21 LAB — LIPID PANEL
Chol/HDL Ratio: 2 ratio (ref 0.0–4.4)
Cholesterol, Total: 122 mg/dL (ref 100–199)
HDL: 60 mg/dL (ref >39)
LDL Chol Calc (NIH): 49 mg/dL (ref 0–99)
Triglycerides: 59 mg/dL (ref 0–149)
VLDL Cholesterol Cal: 13 mg/dL (ref 5–40)

## 2022-04-21 MED ORDER — ATORVASTATIN CALCIUM 20 MG PO TABS
20.0000 mg | ORAL_TABLET | Freq: Every day | ORAL | 3 refills | Status: DC
Start: 2022-04-21 — End: 2023-04-25

## 2022-04-21 NOTE — Patient Instructions (Signed)
  Ms. Weinhardt , Thank you for taking time to come for your Medicare Wellness Visit. I appreciate your ongoing commitment to your health goals. Please review the following plan we discussed and let me know if I can assist you in the future.   These are the goals we discussed:  Goals   None     This is a list of the screening recommended for you and due dates:  Health Maintenance  Topic Date Due   COVID-19 Vaccine (5 - 2023-24 season) 09/18/2021   Medicare Annual Wellness Visit  04/16/2022   Flu Shot  08/19/2022   Mammogram  11/05/2023   Cologuard (Stool DNA test)  04/29/2024   DTaP/Tdap/Td vaccine (3 - Td or Tdap) 02/13/2028   Pneumonia Vaccine  Completed   DEXA scan (bone density measurement)  Completed   Hepatitis C Screening: USPSTF Recommendation to screen - Ages 27-79 yo.  Completed   Zoster (Shingles) Vaccine  Completed   HPV Vaccine  Aged Out

## 2022-04-21 NOTE — Progress Notes (Signed)
Megan Nunez is a 77 y.o. female who presents for annual wellness visit and follow-up on chronic medical conditions.  A recent DEXA scan does show evidence of osteopenia.  She also complains of tinnitus that has been going on for a while and is now starting to cause some troubles at night.  She also has a slight tremor but not enough that it is affecting any of her ADLs.  She does have a history of atherosclerosis of the aorta.  She continues to be followed by her therapist and is doing well on her present medication regimen.  She has had a knee replacement and is doing well with this.  She is having difficulty with mixed incontinence and would like a referral.  She also is noting some hair loss and would like to see a dermatologist concerning this.  She does have hyperlipidemia and is doing well on her present medication regimen.  She is married and this is seem to be going well.  Immunizations and Health Maintenance Immunization History  Administered Date(s) Administered   Fluad Quad(high Dose 65+) 01/01/2020, 02/13/2021, 09/29/2021   Influenza Split 01/27/2011   Influenza, High Dose Seasonal PF 11/14/2013, 11/05/2014, 12/25/2015, 09/23/2016, 01/03/2018   Influenza,inj,Quad PF,6+ Mos 03/20/2013   PFIZER(Purple Top)SARS-COV-2 Vaccination 02/25/2019, 03/22/2019, 11/30/2019   Pfizer Covid-19 Vaccine Bivalent Booster 42yrs & up 09/26/2020   Pneumococcal Conjugate-13 07/12/2013   Pneumococcal Polysaccharide-23 11/05/2014   Tdap 08/27/2008, 02/12/2018   Zoster Recombinat (Shingrix) 02/12/2018, 08/04/2018   Zoster, Live 12/30/2008   Health Maintenance Due  Topic Date Due   COVID-19 Vaccine (5 - 2023-24 season) 09/18/2021   Medicare Annual Wellness (AWV)  04/16/2022    Last Pap smear: Last mammogram: 10/27/2021 Last colonoscopy: Cologuard 04/29/2021 Last DEXA: 10/19/2021 Dentist: within the last year Ophtho: within the last year Exercise: walking 2-3 times a week  Other doctors caring for  patient include: Psychiatry- Thayer Headings NP   Advanced directives:  Yes. On file  Depression screen:  See questionnaire below.     04/21/2022    8:32 AM 04/08/2022    9:16 AM 04/15/2021    8:31 AM 03/27/2021    3:12 PM 02/13/2021   10:13 AM  Depression screen PHQ 2/9  Decreased Interest 0 0 0 0 0  Down, Depressed, Hopeless 1 1 0 0 0  PHQ - 2 Score 1 1 0 0 0  Altered sleeping 1 0   0  Tired, decreased energy 1 0   0  Change in appetite 0 0   1  Feeling bad or failure about yourself  0 1   0  Trouble concentrating 1 1   1   Moving slowly or fidgety/restless 1 0   0  Suicidal thoughts 0 0   0  PHQ-9 Score 5 3   2   Difficult doing work/chores Somewhat difficult Somewhat difficult   Not difficult at all    Fall Risk Screen: see questionnaire below.    04/21/2022    8:31 AM 04/08/2022    9:11 AM 04/15/2021    8:30 AM 03/27/2021    3:11 PM 10/23/2020    4:07 PM  Fall Risk   Falls in the past year? 0 0 1 0 0  Number falls in past yr: 0 0 0 0 0  Injury with Fall? 0 0 1 0 0  Risk for fall due to : No Fall Risks No Fall Risks Impaired balance/gait No Fall Risks No Fall Risks  Follow up Falls evaluation completed  Falls evaluation completed Falls evaluation completed Falls evaluation completed Falls evaluation completed    ADL screen:  See questionnaire below Functional Status Survey: Is the patient deaf or have difficulty hearing?: No Does the patient have difficulty seeing, even when wearing glasses/contacts?: No Does the patient have difficulty concentrating, remembering, or making decisions?: Yes Does the patient have difficulty walking or climbing stairs?: No Does the patient have difficulty dressing or bathing?: No Does the patient have difficulty doing errands alone such as visiting a doctor's office or shopping?: No   Review of Systems Constitutional: -, -unexpected weight change, -anorexia, -fatigue Allergy: -sneezing, -itching, -congestion Dermatology: denies changing  moles, rash, lumps ENT: -runny nose, -ear pain, -sore throat,  Cardiology:  -chest pain, -palpitations, -orthopnea, Respiratory: -cough, -shortness of breath, -dyspnea on exertion, -wheezing,  Gastroenterology: -abdominal pain, -nausea, -vomiting, -diarrhea, -constipation, -dysphagia Hematology: -bleeding or bruising problems Musculoskeletal: -arthralgias, -myalgias, -joint swelling, -back pain, - Ophthalmology: -vision changes,  Urology: -dysuria, -difficulty urinating,  -urinary frequency, -urgency, incontinence Neurology: -, -numbness, , -memory loss, -falls, -dizziness    PHYSICAL EXAM:  BP 122/64   Pulse 67   Temp 98 F (36.7 C) (Oral)   Ht 4' 11.75" (1.518 m)   Wt 151 lb 12.8 oz (68.9 kg)   SpO2 95% Comment: room air  BMI 29.90 kg/m   General Appearance: Alert, cooperative, no distress, appears stated age Head: Normocephalic, without obvious abnormality, atraumatic thinning of the hair is noted. Eyes: PERRL, conjunctiva/corneas clear, EOM's intact,  Ears: Normal TM's and external ear canals Nose: Nares normal, mucosa normal, no drainage or sinus tenderness Throat: Lips, mucosa, and tongue normal; teeth and gums normal Neck: Supple, no lymphadenopathy;  thyroid:  no enlargement/tenderness/nodules; no carotid bruit or JVD Lungs: Clear to auscultation bilaterally without wheezes, rales or ronchi; respirations unlabored Heart: Regular rate and rhythm, S1 and S2 normal, no murmur, rubor gallop Abdomen: Soft, non-tender, nondistended, normoactive bowel sounds,  no masses, no hepatosplenomegaly Skin:  Skin color, texture, turgor normal, no rashes or lesions Lymph nodes: Cervical, supraclavicular, and axillary nodes normal Neurologic:  CNII-XII intact, normal strength, sensation and gait; reflexes 2+ and symmetric throughout Psych: Normal mood, affect, hygiene and grooming.  ASSESSMENT/PLAN: Routine general medical examination at a health care facility - Plan: CBC with  Differential/Platelet, Comprehensive metabolic panel, Lipid panel  Osteopenia, unspecified location  Arthritis  Mixed incontinence urge and stress - Plan: Ambulatory referral to Urology  ADHD, predominantly inattentive type  Major depressive disorder, recurrent episode, mild  Hyperlipidemia, unspecified hyperlipidemia type - Plan: Lipid panel, atorvastatin (LIPITOR) 20 MG tablet  Tinnitus of both ears  Alopecia - Plan: Ambulatory referral to Dermatology  Tremor  Atherosclerosis of aorta I discussed the tinnitus and recommend white noise at night to see if this will help.  I then discussed the tremor that she is having and at this point does not seem to be interfering with her ADLs.  If she starts to have more difficulty further evaluation neurologically might be needed.  She was comfortable with that.  She will continue on her counseling.  Did recommend increasing her physical activity to 20 minutes of something on a regular basis.  She is to check with the pharmacy concerning getting documentation of the flu shot and RSV.   Discussed ; at least 30 minutes of aerobic activity at least 5 days/week and weight-bearing exercise 2x/week; proper sunscreen use reviewed; healthy diet, including goals of calcium and vitamin D intake ) reviewed; regular seatbelt use; changing batteries in  smoke detectors.  Immunization recommendations discussed.  Colonoscopy recommendations reviewed   Medicare Attestation I have personally reviewed: The patient's medical and social history Their use of alcohol, tobacco or illicit drugs Their current medications and supplements The patient's functional ability including ADLs,fall risks, home safety risks, cognitive, and hearing and visual impairment Diet and physical activities Evidence for depression or mood disorders  The patient's weight, height, and BMI have been recorded in the chart.  I have made referrals, counseling, and provided education to the  patient based on review of the above and I have provided the patient with a written personalized care plan for preventive services.     Jill Alexanders, MD   04/21/2022

## 2022-05-09 NOTE — Progress Notes (Unsigned)
Assessment: No diagnosis found.   Plan: ***  Chief Complaint: urinary incontinence  History of Present Illness:  Megan Nunez is a 77 y.o. female who is seen in consultation from Ronnald Nian, MD for evaluation of urinary incontinence.   Past Medical History:  Past Medical History:  Diagnosis Date   Anxiety    Arthritis    Depression    Family history of adverse reaction to anesthesia 2012   Mother has cardiac  arrest after surgery   Hepatitis    high antibodies for hepatitis   Obesity     Past Surgical History:  Past Surgical History:  Procedure Laterality Date   LAPAROSCOPIC TUBAL LIGATION  1985   TOTAL KNEE ARTHROPLASTY Right 04/15/2020   Procedure: TOTAL KNEE ARTHROPLASTY;  Surgeon: Teryl Lucy, MD;  Location: [facility];  Service: Orthopedics;  Laterality: Right;   TOTAL KNEE ARTHROPLASTY Left 08/05/2020   Procedure: TOTAL KNEE ARTHROPLASTY;  Surgeon: Teryl Lucy, MD;  Location: [facility];  Service: Orthopedics;  Laterality: Left;   TOTAL SHOULDER ARTHROPLASTY Right 03/31/2021   Procedure: TOTAL SHOULDER ARTHROPLASTY;  Surgeon: Teryl Lucy, MD;  Location: [facility];  Service: Orthopedics;  Laterality: Right;   WISDOM TOOTH EXTRACTION     age 38    Allergies:  Allergies  Allergen Reactions   Nickel Itching and Rash    Family History:  Family History  Problem Relation Age of Onset   Cancer Brother 8       pancreatic cancer   Alcohol abuse Brother    Depression Brother    Asthma Paternal Grandmother    Heart disease Mother        Mitral valve disease.   Dementia Mother    Lung disease Father        Idiopathic pulmonary fibrosis   Heart disease Maternal Grandmother        Died after complications of angioplasty.   Depression Sister    Alcohol abuse Sister    Alcohol abuse Brother    Depression Brother    Alcohol abuse Brother    Depression Cousin    Depression Other     Social History:  Social History   Tobacco Use   Smoking status:  Never   Smokeless tobacco: Never  Vaping Use   Vaping Use: Never used  Substance Use Topics   Alcohol use: Yes    Alcohol/week: 1.0 standard drink of alcohol    Types: 1 Glasses of wine per week   Drug use: No    Review of symptoms:  Constitutional:  Negative for unexplained weight loss, night sweats, fever, chills ENT:  Negative for nose bleeds, sinus pain, painful swallowing CV:  Negative for chest pain, shortness of breath, exercise intolerance, palpitations, loss of consciousness Resp:  Negative for cough, wheezing, shortness of breath GI:  Negative for nausea, vomiting, diarrhea, bloody stools GU:  Positives noted in HPI; otherwise negative for gross hematuria, dysuria, urinary incontinence Neuro:  Negative for seizures, poor balance, limb weakness, slurred speech Psych:  Negative for lack of energy, depression, anxiety Endocrine:  Negative for polydipsia, polyuria, symptoms of hypoglycemia (dizziness, hunger, sweating) Hematologic:  Negative for anemia, purpura, petechia, prolonged or excessive bleeding, use of anticoagulants  Allergic:  Negative for difficulty breathing or choking as a result of exposure to anything; no shellfish allergy; no allergic response (rash/itch) to materials, foods  Physical exam: There were no vitals taken for this visit. GENERAL APPEARANCE:  Well appearing, well developed, well nourished, NAD HEENT:  Atraumatic, Normocephalic, oropharynx clear. NECK: Supple without lymphadenopathy or thyromegaly. LUNGS: Clear to auscultation bilaterally. HEART: Regular Rate and Rhythm without murmurs, gallops, or rubs. ABDOMEN: Soft, non-tender, No Masses. EXTREMITIES: Moves all extremities well.  Without clubbing, cyanosis, or edema. NEUROLOGIC:  Alert and oriented x 3, normal gait, CN II-XII grossly intact.  MENTAL STATUS:  Appropriate. BACK:  Non-tender to palpation.  No CVAT SKIN:  Warm, dry and intact.    Results: No results found for this or any previous  visit (from the past 24 hour(s)).

## 2022-05-10 ENCOUNTER — Ambulatory Visit: Payer: Medicare Other | Admitting: Urology

## 2022-05-10 ENCOUNTER — Encounter: Payer: Self-pay | Admitting: Urology

## 2022-05-10 VITALS — BP 147/81 | HR 86 | Ht 60.0 in | Wt 150.0 lb

## 2022-05-10 DIAGNOSIS — N3281 Overactive bladder: Secondary | ICD-10-CM

## 2022-05-10 DIAGNOSIS — N3941 Urge incontinence: Secondary | ICD-10-CM

## 2022-05-10 LAB — URINALYSIS, ROUTINE W REFLEX MICROSCOPIC
Bilirubin, UA: NEGATIVE
Glucose, UA: NEGATIVE
Ketones, UA: NEGATIVE
Nitrite, UA: NEGATIVE
Protein,UA: NEGATIVE
Specific Gravity, UA: 1.02 (ref 1.005–1.030)
Urobilinogen, Ur: 0.2 mg/dL (ref 0.2–1.0)
pH, UA: 6 (ref 5.0–7.5)

## 2022-05-10 LAB — MICROSCOPIC EXAMINATION
Cast Type: NONE SEEN
Casts: NONE SEEN /lpf
Crystal Type: NONE SEEN
Crystals: NONE SEEN
Mucus, UA: NONE SEEN
Trichomonas, UA: NONE SEEN
Yeast, UA: NONE SEEN

## 2022-05-10 LAB — BLADDER SCAN AMB NON-IMAGING

## 2022-05-10 MED ORDER — SOLIFENACIN SUCCINATE 5 MG PO TABS
5.0000 mg | ORAL_TABLET | Freq: Every day | ORAL | 3 refills | Status: DC
Start: 2022-05-10 — End: 2022-08-25

## 2022-05-10 MED ORDER — ESTRADIOL 0.1 MG/GM VA CREA
1.0000 | TOPICAL_CREAM | VAGINAL | 3 refills | Status: DC
Start: 2022-05-10 — End: 2023-08-17

## 2022-05-13 ENCOUNTER — Telehealth: Payer: Self-pay | Admitting: Psychiatry

## 2022-05-13 DIAGNOSIS — F3341 Major depressive disorder, recurrent, in partial remission: Secondary | ICD-10-CM

## 2022-05-13 DIAGNOSIS — F5081 Binge eating disorder: Secondary | ICD-10-CM

## 2022-05-13 DIAGNOSIS — F900 Attention-deficit hyperactivity disorder, predominantly inattentive type: Secondary | ICD-10-CM

## 2022-05-13 MED ORDER — LISDEXAMFETAMINE DIMESYLATE 50 MG PO CAPS
50.0000 mg | ORAL_CAPSULE | Freq: Every day | ORAL | 0 refills | Status: DC
Start: 2022-05-14 — End: 2022-09-01

## 2022-05-13 MED ORDER — DULOXETINE HCL 30 MG PO CPEP: ORAL_CAPSULE | ORAL | 1 refills | Status: DC

## 2022-05-13 MED ORDER — LISDEXAMFETAMINE DIMESYLATE 50 MG PO CAPS
50.0000 mg | ORAL_CAPSULE | Freq: Every day | ORAL | 0 refills | Status: DC
Start: 2022-07-08 — End: 2022-09-01

## 2022-05-13 MED ORDER — LISDEXAMFETAMINE DIMESYLATE 50 MG PO CAPS
50.0000 mg | ORAL_CAPSULE | Freq: Every day | ORAL | 0 refills | Status: DC
Start: 2022-06-11 — End: 2022-09-01

## 2022-05-13 MED ORDER — ARIPIPRAZOLE 5 MG PO TABS
5.0000 mg | ORAL_TABLET | Freq: Every day | ORAL | 1 refills | Status: DC
Start: 2022-05-13 — End: 2022-09-01

## 2022-05-13 MED ORDER — DULOXETINE HCL 60 MG PO CPEP
60.0000 mg | ORAL_CAPSULE | Freq: Every morning | ORAL | 1 refills | Status: DC
Start: 2022-05-13 — End: 2022-09-01

## 2022-05-13 NOTE — Telephone Encounter (Signed)
Please let her know that her scripts have been sent.

## 2022-05-13 NOTE — Telephone Encounter (Addendum)
Pt called requesting to extend apt from 4/30 to 7/15. Stating doing good and had touch base with you during husbands apt. However, will need RF on meds before 7/15 apt. Stated will call pharmacy requesting RF. [phone]

## 2022-05-18 ENCOUNTER — Ambulatory Visit (INDEPENDENT_AMBULATORY_CARE_PROVIDER_SITE_OTHER): Payer: Medicare Other | Admitting: Psychiatry

## 2022-05-25 IMAGING — DX DG SHOULDER 1V*R*
2 series · 2 of 2 positions shown · non-contrast
Comparison: None.

CLINICAL DATA: Postoperative right shoulder arthroplasty

EXAM:
RIGHT SHOULDER - 1 VIEW

[shoulder ap (1 of 2)]
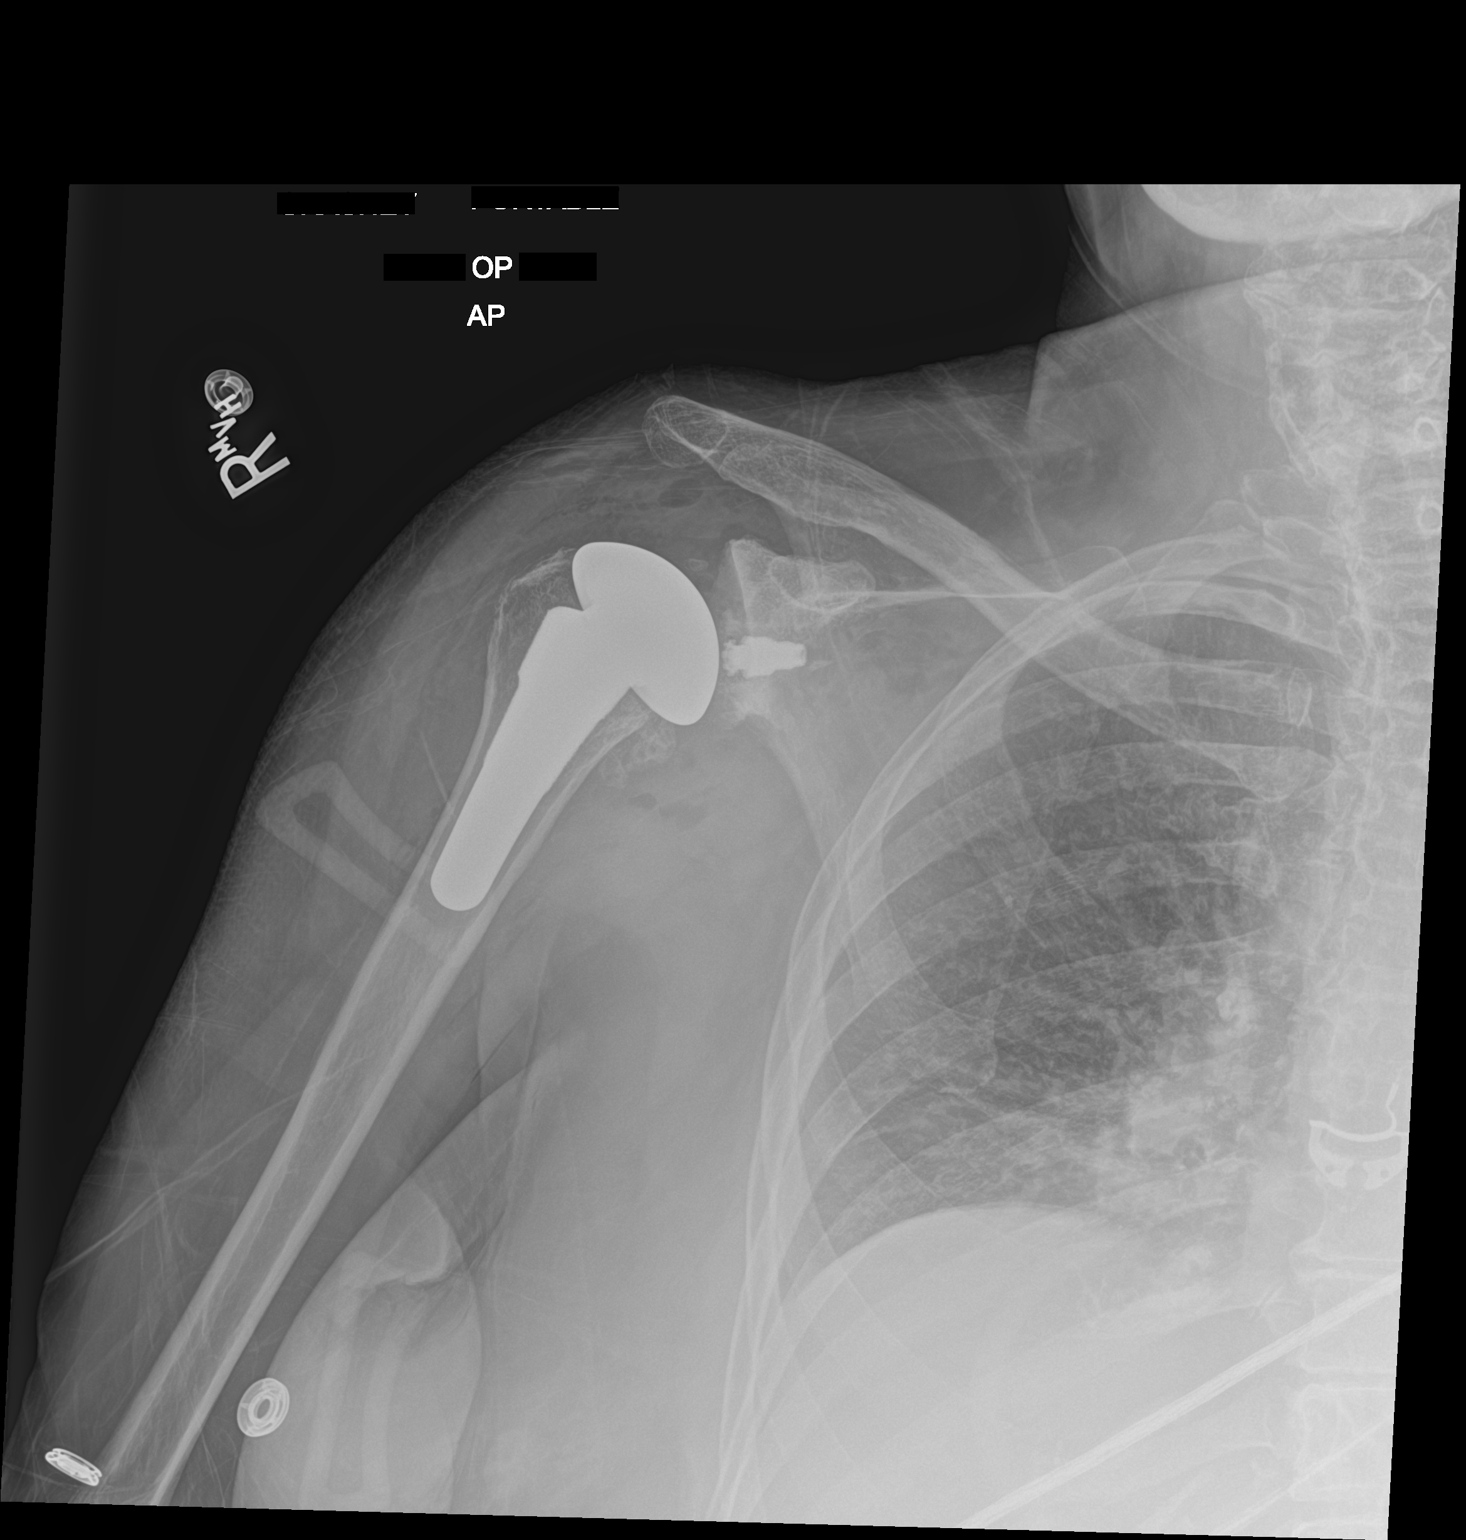

[shoulder ap (2 of 2)]
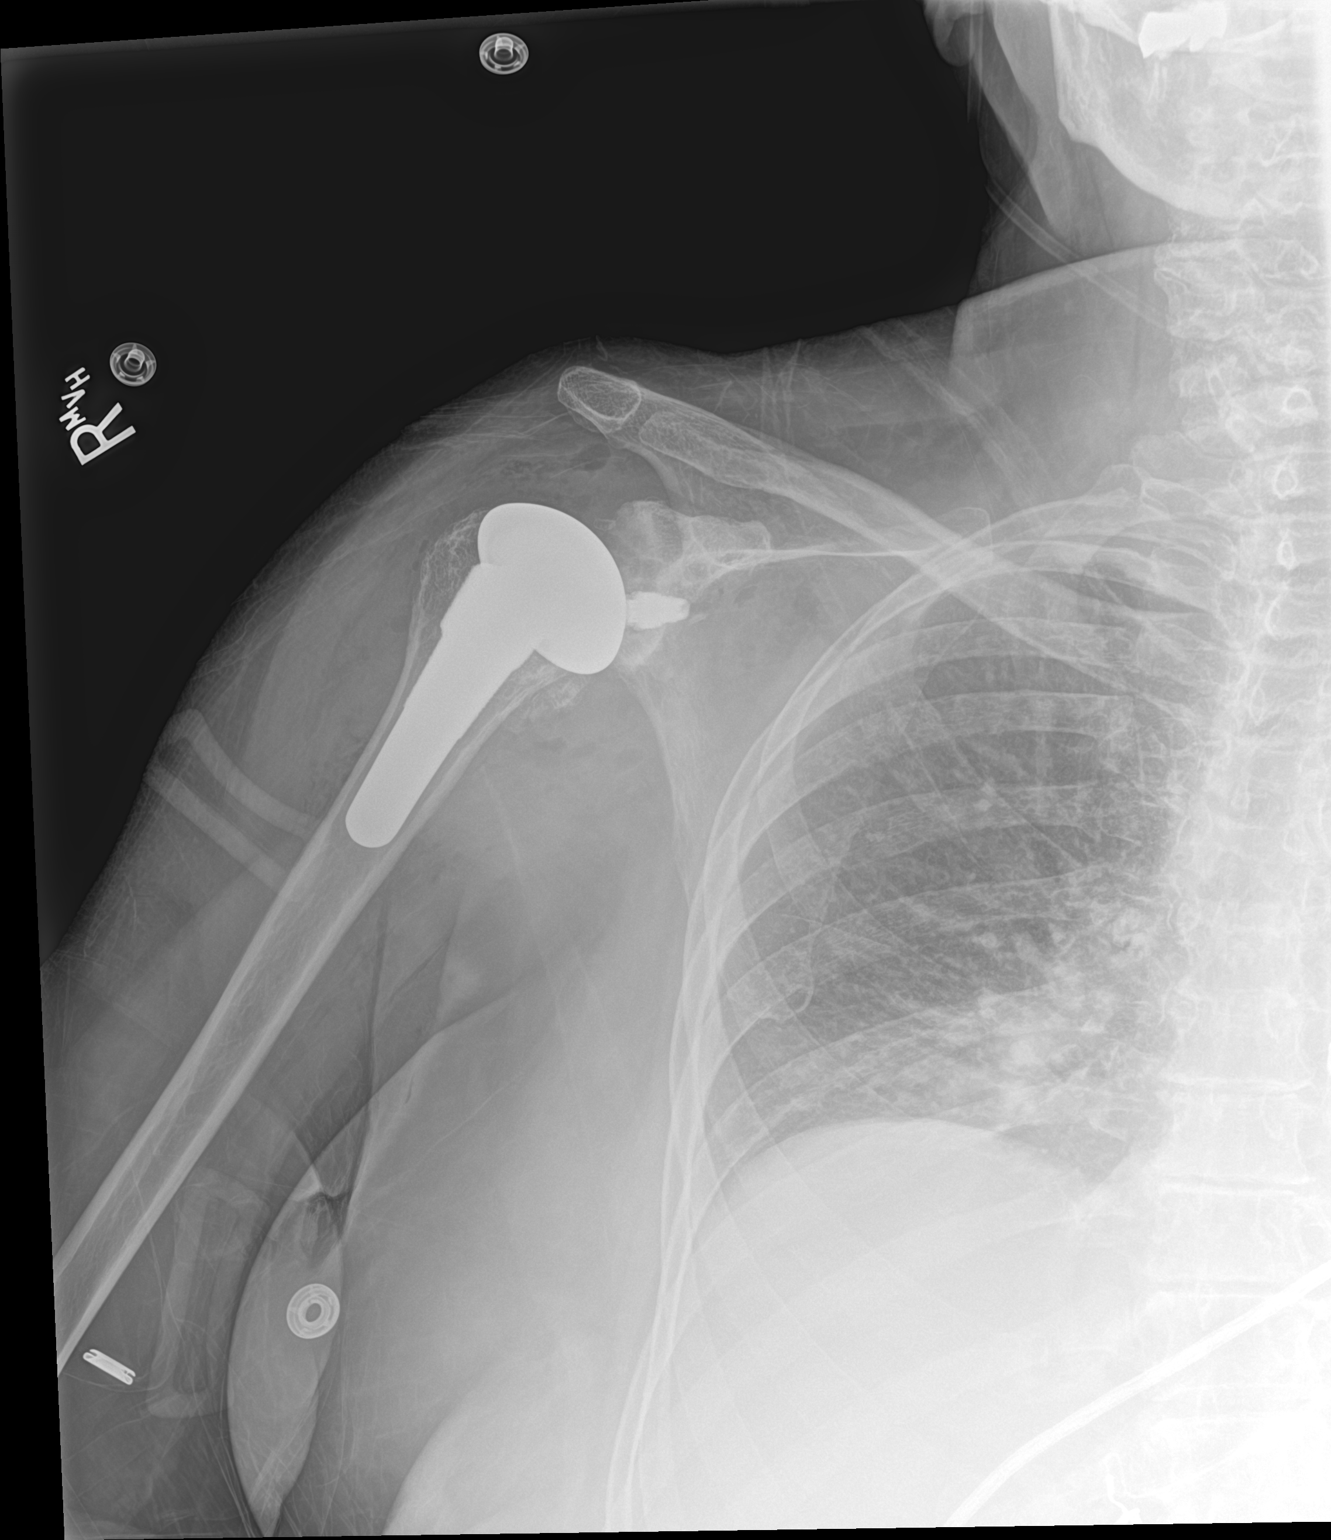

[2 of 2 positions shown; findings below may reference images not displayed]

FINDINGS: Status post right shoulder arthroplasty with expected overlying
postoperative change. No evidence of perihardware fracture or
component malpositioning.
IMPRESSION: Status post right shoulder arthroplasty with expected overlying
postoperative change. No evidence of perihardware fracture or
component malpositioning.

## 2022-07-14 DIAGNOSIS — L648 Other androgenic alopecia: Secondary | ICD-10-CM | POA: Diagnosis not present

## 2022-08-02 ENCOUNTER — Ambulatory Visit: Payer: Medicare Other | Admitting: Psychiatry

## 2022-08-04 ENCOUNTER — Encounter: Payer: Self-pay | Admitting: Psychiatry

## 2022-08-04 ENCOUNTER — Ambulatory Visit: Payer: Medicare Other | Admitting: Psychiatry

## 2022-08-04 VITALS — BP 124/77 | HR 67

## 2022-08-04 DIAGNOSIS — F339 Major depressive disorder, recurrent, unspecified: Secondary | ICD-10-CM

## 2022-08-04 DIAGNOSIS — F900 Attention-deficit hyperactivity disorder, predominantly inattentive type: Secondary | ICD-10-CM | POA: Diagnosis not present

## 2022-08-04 MED ORDER — AUVELITY 45-105 MG PO TBCR: 1.0000 | EXTENDED_RELEASE_TABLET | Freq: Two times a day (BID) | ORAL | Status: DC

## 2022-08-04 NOTE — Progress Notes (Signed)
Megan Nunez [id number] 25-Apr-1945 77 y.o.  Subjective:   Patient ID:  Megan Nunez is a 77 y.o. (DOB [birth year]) female.  Chief Complaint:  Chief Complaint  Patient presents with   Depression   Anxiety    Depression        Past medical history includes anxiety.   Anxiety     Megan Nunez presents to the office today for follow-up of Depression, anxiety, and ADHD.  She reports feeling depressed and reports that her husband is also experiencing depression. She reports, "I'm having a hard time getting anything done." She stopped taking Vyvanse for awhile and has recently started taking it again some days. She reports that "nagging little feeling of depression" resolved after starting Auvelity, "but I think it has been replaced by numbness." She has been taking Auvelity once daily. She reports, "I'm dealing with the outside world" to include making and keeping various appointments, etc. She reports, "I'm having difficulty getting rid of clutter." She reports that she had some rumination and guilt about not passing along a doll that belonged to her grandmother. She reports that she has anxiety about the clutter in her home.  She is sleeping ok and reports that she does not want to get out of bed. Denies SI.  Sister is 8 years younger.   Past Psychiatric Medication Trials: Sertraline Prozac Cymbalta Wellbutrin XL Abilify Adderall Adderall XR Vyvanse  AIMS    Flowsheet Row Office Visit from 12/29/2021 in [facility] Office Visit from 07/14/2021 in [facility] Office Visit from 05/13/2021 in [facility] Office Visit from 02/05/2021 in [facility]  AIMS Total Score 0 2 1 0      PHQ2-9    Flowsheet Row Office Visit from 04/21/2022 in [facility] Office Visit from 04/08/2022 in [facility] Office Visit from 04/15/2021 in [facility]  Office Visit from 03/27/2021 in [facility] Office Visit from 02/13/2021 in [facility]  PHQ-2 Total Score 1 1 0 0 0  PHQ-9 Total Score 5 3 -- -- 2      Flowsheet Row ED from 04/10/2021 in [facility] Emergency Department at [facility] Admission (Discharged) from 03/31/2021 in [facility] Pre-Admission Testing 60 from 03/19/2021 in [facility]-PRE[facility]URGICAL TESTING  C-SSRS RISK CATEGORY No Risk No Risk No Risk        Review of Systems:  Review of Systems  Genitourinary:        Improved incontinence  Musculoskeletal:  Negative for gait problem.  Psychiatric/Behavioral:  Positive for depression.        Please refer to HPI    Medications: I have reviewed the patient's current medications.  Current Outpatient Medications  Medication Sig Dispense Refill   ARIPiprazole (ABILIFY) 5 MG tablet Take 1 tablet (5 mg total) by mouth daily. 90 tablet 1   atorvastatin (LIPITOR) 20 MG tablet Take 1 tablet (20 mg total) by mouth daily. 90 tablet 3   B Complex-C (B-COMPLEX WITH VITAMIN C) tablet Take 1 tablet by mouth daily.     BIOTIN PO Take 10,000 mcg by mouth daily.     Calcium Citrate-Vitamin D (CALCIUM CITRATE + D3) 200-6.25 MG-MCG TABS Take by mouth.     Cholecalciferol (VITAMIN D) 50 MCG (2000 UT) tablet Take 5,000 Units by mouth daily.     DULoxetine (CYMBALTA) 30 MG capsule TAKE ONE CAPSULE BY MOUTH DAILY  TAKE WITH 60 MG 90 capsule 1   DULoxetine (CYMBALTA) 60 MG capsule Take 1 capsule (60 mg total) by mouth every morning. Take with a 30 mg capsule to equal total dose of 90 mg 90 capsule 1   Ginkgo Biloba 40 MG TABS Take 120 mg by mouth daily.     GINSENG PO Take 400 mg by mouth daily.     lisdexamfetamine (VYVANSE) 50 MG capsule Take 1 capsule (50 mg total) by mouth daily. 30 capsule 0   MAGNESIUM PO Take by mouth.     Multiple Vitamins-Minerals (MULTIVITAMIN WITH MINERALS) tablet Take 1 tablet by mouth daily.      solifenacin (VESICARE) 5 MG tablet Take 1 tablet (5 mg total) by mouth daily. 90 tablet 3   Turmeric 500 MG CAPS Take 500 mg by mouth daily.     Dextromethorphan-buPROPion ER (AUVELITY) 45-105 MG TBCR Take 1 tablet by mouth 2 (two) times daily.     estradiol (ESTRACE VAGINAL) 0.1 MG/GM vaginal cream Place 1 Applicatorful vaginally 3 (three) times a week. 42.5 g 3   lisdexamfetamine (VYVANSE) 50 MG capsule Take 1 capsule (50 mg total) by mouth daily. 30 capsule 0   lisdexamfetamine (VYVANSE) 50 MG capsule Take 1 capsule (50 mg total) by mouth daily. 30 capsule 0   polyvinyl alcohol (LIQUIFILM TEARS) 1.4 % ophthalmic solution Place 1 drop into both eyes as needed for dry eyes.     No current facility-administered medications for this visit.    Medication Side Effects: None  Allergies:  Allergies  Allergen Reactions   Nickel Itching and Rash    Past Medical History:  Diagnosis Date   Anxiety    Arthritis    Depression    Family history of adverse reaction to anesthesia 2012   Mother has cardiac  arrest after surgery   Hepatitis    high antibodies for hepatitis   Obesity     Past Medical History, Surgical history, Social history, and Family history were reviewed and updated as appropriate.   Please see review of systems for further details on the patient's review from today.   Objective:   Physical Exam:  BP 124/77   Pulse 67   Physical Exam Constitutional:      General: She is not in acute distress. Musculoskeletal:        General: No deformity.  Neurological:     Mental Status: She is alert and oriented to person, place, and time.     Coordination: Coordination normal.  Psychiatric:        Attention and Perception: Attention and perception normal. She does not perceive auditory or visual hallucinations.        Mood and Affect: Mood is anxious and depressed. Affect is not labile, blunt, angry or inappropriate.        Speech: Speech normal.        Behavior: Behavior  normal.        Thought Content: Thought content normal. Thought content is not paranoid or delusional. Thought content does not include homicidal or suicidal ideation. Thought content does not include homicidal or suicidal plan.        Cognition and Memory: Cognition and memory normal.        Judgment: Judgment normal.     Comments: Insight intact     Lab Review:     Component Value Date/Time   NA 142 04/21/2022 0932   K 5.0 04/21/2022 0932   CL 103 04/21/2022 0932  CO2 21 04/21/2022 0932   GLUCOSE 103 (H) 04/21/2022 0932   GLUCOSE 131 (H) 04/01/2021 0325   BUN 12 04/21/2022 0932   CREATININE 0.75 04/21/2022 0932   CREATININE 0.63 12/08/2016 1030   CALCIUM 9.3 04/21/2022 0932   PROT 6.5 04/21/2022 0932   ALBUMIN 4.4 04/21/2022 0932   AST 15 04/21/2022 0932   ALT 12 04/21/2022 0932   ALKPHOS 83 04/21/2022 0932   BILITOT 0.4 04/21/2022 0932   GFRNONAA >60 04/01/2021 0325   GFRAA 88 04/09/2019 1239       Component Value Date/Time   WBC 7.6 04/21/2022 0932   WBC 15.8 (H) 04/01/2021 0325   RBC 4.79 04/21/2022 0932   RBC 4.19 04/01/2021 0325   HGB 14.5 04/21/2022 0932   HCT 43.7 04/21/2022 0932   PLT 216 04/21/2022 0932   MCV 91 04/21/2022 0932   MCH 30.3 04/21/2022 0932   MCH 30.1 04/01/2021 0325   MCHC 33.2 04/21/2022 0932   MCHC 33.3 04/01/2021 0325   RDW 12.6 04/21/2022 0932   LYMPHSABS 1.8 04/21/2022 0932   MONOABS 546 12/25/2015 1405   EOSABS 0.1 04/21/2022 0932   BASOSABS 0.1 04/21/2022 0932    No results found for: "POCLITH", "LITHIUM"   No results found for: "PHENYTOIN", "PHENOBARB", "VALPROATE", "CBMZ"   .res Assessment: Plan:   41 minutes spent dedicated to the care of this patient on the date of this encounter to include pre-visit review of records, ordering of medication, post visit documentation, and face-to-face time with the patient discussing treatment options for depression to include increasing Auvelity to twice daily or switching Abilify to an  alternative medication, such as Rexulti or Vraylar. Pt reports that she would prefer to first increase Auvelity to twice daily to determine if this is helpful for her depression.  Discussed also using Vyvanse on a more consistent basis to improve concentration, energy, and motivation.  Will continue Abilify 5 mg daily for depression.  Continue Duloxetine 90 mg daily for anxiety and depression. Pt to follow-up with this provider in 4 weeks or sooner if clinically indicated.  Patient advised to contact office with any questions, adverse effects, or acute worsening in signs and symptoms.   Lilu was seen today for depression and anxiety.  Diagnoses and all orders for this visit:  Major depression, recurrent, chronic (HCC) -     Dextromethorphan-buPROPion ER (AUVELITY) 45-105 MG TBCR; Take 1 tablet by mouth 2 (two) times daily.  Attention deficit hyperactivity disorder (ADHD), predominantly inattentive type     Please see After Visit Summary for patient specific instructions.  Future Appointments  Date Time Provider Department Center  08/25/2022  9:30 AM Joline Maxcy, MD [facility] None  09/01/2022  9:00 AM Corie Chiquito, PMHNP [facility] None  05/04/2023  8:30 AM Ronnald Nian, MD [facility]    No orders of the defined types were placed in this encounter.   -------------------------------

## 2022-08-10 ENCOUNTER — Ambulatory Visit: Payer: Medicare Other | Admitting: Urology

## 2022-08-25 ENCOUNTER — Ambulatory Visit: Payer: Medicare Other | Admitting: Urology

## 2022-08-25 ENCOUNTER — Encounter: Payer: Self-pay | Admitting: Urology

## 2022-08-25 VITALS — BP 122/68 | HR 67

## 2022-08-25 DIAGNOSIS — N3941 Urge incontinence: Secondary | ICD-10-CM | POA: Diagnosis not present

## 2022-08-25 DIAGNOSIS — N3281 Overactive bladder: Secondary | ICD-10-CM | POA: Diagnosis not present

## 2022-08-25 MED ORDER — SOLIFENACIN SUCCINATE 5 MG PO TABS: 5.0000 mg | ORAL_TABLET | Freq: Every day | ORAL | 3 refills | Status: DC

## 2022-08-25 NOTE — Progress Notes (Addendum)
   Assessment: 1. OAB (overactive bladder)   2. Urge incontinence     Plan: Continue Estrace vaginal cream 3 times weekly Continue Vesicare 5 mg daily Follow-up 1 year or sooner if problems arise  ADDENDUM 08/30/22-- URINE CULTURE GREW PAN SENSITIVE ECOLI WILL TREAT WITH OMNICEF X 5 DAYS  Chief Complaint: luts  HPI: Megan Nunez is a 77 y.o. female who presents for continued evaluation of OAB/urge incontinence.  Please see my note 05/10/2022 at the time of initial visit for detailed history and exam. At that time the patient was started on vaginal estrogen cream (Estrace) and also Vesicare 5 mg daily. Patient reports that she is extremely pleased with near resolution of her urinary symptoms.  She is also tolerating the Vesicare quite well without significant side effects.   Portions of the above documentation were copied from a prior visit for review purposes only.  Allergies: Allergies  Allergen Reactions   Nickel Itching and Rash    PMH: Past Medical History:  Diagnosis Date   Anxiety    Arthritis    Depression    Family history of adverse reaction to anesthesia 2012   Mother has cardiac  arrest after surgery   Hepatitis    high antibodies for hepatitis   Obesity     PSH: Past Surgical History:  Procedure Laterality Date   LAPAROSCOPIC TUBAL LIGATION  1985   TOTAL KNEE ARTHROPLASTY Right 04/15/2020   Procedure: TOTAL KNEE ARTHROPLASTY;  Surgeon: Teryl Lucy, MD;  Location: [facility];  Service: Orthopedics;  Laterality: Right;   TOTAL KNEE ARTHROPLASTY Left 08/05/2020   Procedure: TOTAL KNEE ARTHROPLASTY;  Surgeon: Teryl Lucy, MD;  Location: [facility];  Service: Orthopedics;  Laterality: Left;   TOTAL SHOULDER ARTHROPLASTY Right 03/31/2021   Procedure: TOTAL SHOULDER ARTHROPLASTY;  Surgeon: Teryl Lucy, MD;  Location: [facility];  Service: Orthopedics;  Laterality: Right;   WISDOM TOOTH EXTRACTION     age 32    SH: Social History   Tobacco Use   Smoking  status: Never   Smokeless tobacco: Never  Vaping Use   Vaping status: Never Used  Substance Use Topics   Alcohol use: Yes    Alcohol/week: 1.0 standard drink of alcohol    Types: 1 Glasses of wine per week   Drug use: No    ROS: Constitutional:  Negative for fever, chills, weight loss CV: Negative for chest pain, previous MI, hypertension Respiratory:  Negative for shortness of breath, wheezing, sleep apnea, frequent cough GI:  Negative for nausea, vomiting, bloody stool, GERD  PE: BP 122/68   Pulse 67  GENERAL APPEARANCE:  Well appearing, well developed, well nourished, NAD

## 2022-08-30 MED ORDER — CEFDINIR 300 MG PO CAPS: 300.0000 mg | ORAL_CAPSULE | Freq: Two times a day (BID) | ORAL | 0 refills | Status: DC

## 2022-08-30 NOTE — Addendum Note (Signed)
Addended by: Joline Maxcy on: 08/30/2022 12:25 PM   Modules accepted: Orders

## 2022-09-01 ENCOUNTER — Telehealth: Payer: Medicare Other | Admitting: Psychiatry

## 2022-09-01 ENCOUNTER — Encounter: Payer: Self-pay | Admitting: Psychiatry

## 2022-09-01 DIAGNOSIS — F3341 Major depressive disorder, recurrent, in partial remission: Secondary | ICD-10-CM | POA: Diagnosis not present

## 2022-09-01 DIAGNOSIS — F339 Major depressive disorder, recurrent, unspecified: Secondary | ICD-10-CM

## 2022-09-01 DIAGNOSIS — F900 Attention-deficit hyperactivity disorder, predominantly inattentive type: Secondary | ICD-10-CM

## 2022-09-01 DIAGNOSIS — F5081 Binge eating disorder: Secondary | ICD-10-CM | POA: Diagnosis not present

## 2022-09-01 MED ORDER — ARIPIPRAZOLE 5 MG PO TABS
5.0000 mg | ORAL_TABLET | Freq: Every day | ORAL | 1 refills | Status: DC
Start: 2022-09-01 — End: 2022-12-01

## 2022-09-01 MED ORDER — DULOXETINE HCL 30 MG PO CPEP
ORAL_CAPSULE | ORAL | 1 refills | Status: DC
Start: 2022-09-01 — End: 2022-12-01

## 2022-09-01 MED ORDER — DULOXETINE HCL 60 MG PO CPEP
60.0000 mg | ORAL_CAPSULE | Freq: Every morning | ORAL | 1 refills | Status: DC
Start: 2022-09-01 — End: 2022-12-01

## 2022-09-01 MED ORDER — LISDEXAMFETAMINE DIMESYLATE 50 MG PO CAPS
50.0000 mg | ORAL_CAPSULE | Freq: Every day | ORAL | 0 refills | Status: DC
Start: 2022-10-27 — End: 2023-03-16

## 2022-09-01 MED ORDER — AUVELITY 45-105 MG PO TBCR
1.0000 | EXTENDED_RELEASE_TABLET | Freq: Two times a day (BID) | ORAL | 2 refills | Status: DC
Start: 2022-09-01 — End: 2022-12-01

## 2022-09-01 MED ORDER — LISDEXAMFETAMINE DIMESYLATE 50 MG PO CAPS: 50.0000 mg | ORAL_CAPSULE | Freq: Every day | ORAL | 0 refills | Status: DC

## 2022-09-01 NOTE — Progress Notes (Signed)
Megan Nunez [id number] 1945/10/26 77 y.o.  Virtual Visit via Video Note  I connected with pt @ on 09/01/22 at  9:00 AM EDT by a video enabled telemedicine application and verified that I am speaking with the correct person using two identifiers.   I discussed the limitations of evaluation and management by telemedicine and the availability of in person appointments. The patient expressed understanding and agreed to proceed.  I discussed the assessment and treatment plan with the patient. The patient was provided an opportunity to ask questions and all were answered. The patient agreed with the plan and demonstrated an understanding of the instructions.   The patient was advised to call back or seek an in-person evaluation if the symptoms worsen or if the condition fails to improve as anticipated.  I provided 50 minutes of non-face-to-face time during this encounter.  The patient was located at home.  The provider was located at [facility].   Corie Chiquito, PMHNP   Subjective:   Patient ID:  Megan Nunez is a 77 y.o. (DOB 05-05-1945) female.  Chief Complaint:  Chief Complaint  Patient presents with   Follow-up    Depression, ADHD    HPI Megan Nunez presents for follow-up of depression, ADHD, and sleeping disturbance. She reports, "My life has been dull and I am becoming dull as a result of that." She notices she has been participating in conversations less. She reports that she is not reading as much due to concentration.  She reports, "I think the Marvia Pickles is working." She notices improved mood with taking Auvelity twice daily. She reports feeling "more positive in my outlook on things." She reports, "I think I have some depression built into my life." She reports that her mood is, "as good as could be expected." She reports that she has been going through some belongings and papers and getting rid of things. She reports that she is able to focus and be productive for  hours after taking Vyvanse. She reports that she is able to focus on one thing until she is done with it. She reports, "I think I suppress anxiety very well." She reports anxiety does not interfere with sleep. She reports that she likes her new bed. She reports that she is awakened nightly when her husband comes to bed late at night/early in the morning and hooks up his cPap. Appetite has been "very manageable." She reports that she has had less food cravings and been able to lose weight. Denies SI.   She reports that they had a trip fall through due to husband's health issue. She reports that they do not have a trip planned at this time. She reports, "I'm a little less adventurous than I used to be." She reports some concern about her husband's health.   She reports that she takes Vyvanse "when I have a purpose for it" and this is about 4 times a week.   Past Psychiatric Medication Trials: Sertraline Prozac Cymbalta Wellbutrin XL Abilify Adderall Adderall XR Vyvanse  Review of Systems:  Review of Systems  Genitourinary:        Current UTI  Musculoskeletal:  Negative for gait problem.  Neurological:        She reports tremors have been "less."   Psychiatric/Behavioral:         Please refer to HPI    Medications: I have reviewed the patient's current medications.  Current Outpatient Medications  Medication Sig Dispense Refill   ARIPiprazole (ABILIFY) 5  MG tablet Take 1 tablet (5 mg total) by mouth daily. 90 tablet 1   atorvastatin (LIPITOR) 20 MG tablet Take 1 tablet (20 mg total) by mouth daily. 90 tablet 3   B Complex-C (B-COMPLEX WITH VITAMIN C) tablet Take 1 tablet by mouth daily.     BIOTIN PO Take 10,000 mcg by mouth daily.     Calcium Citrate-Vitamin D (CALCIUM CITRATE + D3) 200-6.25 MG-MCG TABS Take by mouth.     cefdinir (OMNICEF) 300 MG capsule Take 1 capsule (300 mg total) by mouth 2 (two) times daily. 10 capsule 0   Cholecalciferol (VITAMIN D) 50 MCG (2000 UT) tablet  Take 5,000 Units by mouth daily.     Dextromethorphan-buPROPion ER (AUVELITY) 45-105 MG TBCR Take 1 tablet by mouth 2 (two) times daily. 60 tablet 2   DULoxetine (CYMBALTA) 30 MG capsule TAKE ONE CAPSULE BY MOUTH DAILY TAKE WITH 60 MG 90 capsule 1   DULoxetine (CYMBALTA) 60 MG capsule Take 1 capsule (60 mg total) by mouth every morning. Take with a 30 mg capsule to equal total dose of 90 mg 90 capsule 1   estradiol (ESTRACE VAGINAL) 0.1 MG/GM vaginal cream Place 1 Applicatorful vaginally 3 (three) times a week. 42.5 g 3   Ginkgo Biloba 40 MG TABS Take 120 mg by mouth daily.     GINSENG PO Take 400 mg by mouth daily.     lisdexamfetamine (VYVANSE) 50 MG capsule Take 1 capsule (50 mg total) by mouth daily. 30 capsule 0   [START ON 09/29/2022] lisdexamfetamine (VYVANSE) 50 MG capsule Take 1 capsule (50 mg total) by mouth daily. 30 capsule 0   [START ON 10/27/2022] lisdexamfetamine (VYVANSE) 50 MG capsule Take 1 capsule (50 mg total) by mouth daily. 30 capsule 0   MAGNESIUM PO Take by mouth.     Multiple Vitamins-Minerals (MULTIVITAMIN WITH MINERALS) tablet Take 1 tablet by mouth daily.     polyvinyl alcohol (LIQUIFILM TEARS) 1.4 % ophthalmic solution Place 1 drop into both eyes as needed for dry eyes.     solifenacin (VESICARE) 5 MG tablet Take 1 tablet (5 mg total) by mouth daily. 90 tablet 3   Turmeric 500 MG CAPS Take 500 mg by mouth daily.     No current facility-administered medications for this visit.    Medication Side Effects: None  Allergies:  Allergies  Allergen Reactions   Nickel Itching and Rash    Past Medical History:  Diagnosis Date   Anxiety    Arthritis    Depression    Family history of adverse reaction to anesthesia 2012   Mother has cardiac  arrest after surgery   Hepatitis    high antibodies for hepatitis   Obesity     Family History  Problem Relation Age of Onset   Cancer Brother 35       pancreatic cancer   Alcohol abuse Brother    Depression Brother     Asthma Paternal Grandmother    Heart disease Mother        Mitral valve disease.   Dementia Mother    Lung disease Father        Idiopathic pulmonary fibrosis   Heart disease Maternal Grandmother        Died after complications of angioplasty.   Depression Sister    Alcohol abuse Sister    Alcohol abuse Brother    Depression Brother    Alcohol abuse Brother    Depression Cousin    Depression Other  Social History   Socioeconomic History   Marital status: Married    Spouse name: Not on file   Number of children: Not on file   Years of education: Not on file   Highest education level: Not on file  Occupational History   Not on file  Tobacco Use   Smoking status: Never   Smokeless tobacco: Never  Vaping Use   Vaping status: Never Used  Substance and Sexual Activity   Alcohol use: Yes    Alcohol/week: 1.0 standard drink of alcohol    Types: 1 Glasses of wine per week   Drug use: No   Sexual activity: Not Currently  Other Topics Concern   Not on file  Social History Narrative   She is married to another patient of [facility]. They have been married for 20  years.  No children. 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   He never smoked, and drinks maybe 1-3 glass of wine a day.   He has recently started exercising with her husband who has completed cardiac rehabilitation and is in the maintenance program. They usually walks about 2-3 days a week for 30-45 minutes.   Social Determinants of Health   Financial Resource Strain: Low Risk  (02/12/2021)   Overall Financial Resource Strain (CARDIA)    Difficulty of Paying Living Expenses: Not hard at all  Food Insecurity: No Food Insecurity (12/02/2021)   Hunger Vital Sign    Worried About Running Out of Food in the Last Year: Never true    Ran Out of Food in the Last Year: Never true  Transportation Needs: No Transportation Needs (12/02/2021)   PRAPARE - Scientist, research (physical sciences) (Medical): No    Lack of Transportation (Non-Medical): No  Physical Activity: Not on file  Stress: Not on file  Social Connections: Not on file  Intimate Partner Violence: Not on file    Past Medical History, Surgical history, Social history, and Family history were reviewed and updated as appropriate.   Please see review of systems for further details on the patient's review from today.   Objective:   Physical Exam:  Wt 140 lb (63.5 kg)   BMI 27.34 kg/m   Physical Exam Neurological:     Mental Status: She is alert and oriented to person, place, and time.     Cranial Nerves: No dysarthria.  Psychiatric:        Attention and Perception: Attention and perception normal.        Speech: Speech normal.        Behavior: Behavior is cooperative.        Thought Content: Thought content normal. Thought content is not paranoid or delusional. Thought content does not include homicidal or suicidal ideation. Thought content does not include homicidal or suicidal plan.        Cognition and Memory: Cognition and memory normal.        Judgment: Judgment normal.     Comments: Insight intact Mood is mildly depressed and anxious in response to psychosocial factors     Lab Review:     Component Value Date/Time   NA 142 04/21/2022 0932   K 5.0 04/21/2022 0932   CL 103 04/21/2022 0932   CO2 21 04/21/2022 0932   GLUCOSE 103 (H) 04/21/2022 0932   GLUCOSE 131 (H) 04/01/2021 0325   BUN 12 04/21/2022 0932   CREATININE 0.75 04/21/2022 0932   CREATININE  0.63 12/08/2016 1030   CALCIUM 9.3 04/21/2022 0932   PROT 6.5 04/21/2022 0932   ALBUMIN 4.4 04/21/2022 0932   AST 15 04/21/2022 0932   ALT 12 04/21/2022 0932   ALKPHOS 83 04/21/2022 0932   BILITOT 0.4 04/21/2022 0932   GFRNONAA >60 04/01/2021 0325   GFRAA 88 04/09/2019 1239       Component Value Date/Time   WBC 7.6 04/21/2022 0932   WBC 15.8 (H) 04/01/2021 0325   RBC 4.79 04/21/2022 0932   RBC 4.19 04/01/2021 0325    HGB 14.5 04/21/2022 0932   HCT 43.7 04/21/2022 0932   PLT 216 04/21/2022 0932   MCV 91 04/21/2022 0932   MCH 30.3 04/21/2022 0932   MCH 30.1 04/01/2021 0325   MCHC 33.2 04/21/2022 0932   MCHC 33.3 04/01/2021 0325   RDW 12.6 04/21/2022 0932   LYMPHSABS 1.8 04/21/2022 0932   MONOABS 546 12/25/2015 1405   EOSABS 0.1 04/21/2022 0932   BASOSABS 0.1 04/21/2022 0932    No results found for: "POCLITH", "LITHIUM"   No results found for: "PHENYTOIN", "PHENOBARB", "VALPROATE", "CBMZ"   .res Assessment: Plan:    53 minutes spent dedicated to the care of this patient on the date of this encounter to include pre-visit review of records, ordering of medication, post visit documentation, and face-to-face time with the patient discussing response to increase in Auvelity and taking Vyvanse more consistently. She reports that her mood has improved with taking Auvelity twice daily and feels that her mood is now "as well as can be expected." She reports some concern about cost since most recent script was a significantly higher cost compared to other scripts. She reports that she will contact her insurance about auvelity coverage. Provider will also discuss insurance coverage of Auvelity with clinical staff to confirm cost and if PA, tier reduction, etc may lower pt's cost.  Will continue Auvelity 45-105 mg one tablet twice daily for depression. Continue Vyvanse 50 mg daily for ADHD.  Continue Abilify 5 mg daily for augmentation of depression.  Continue Duloxetine 90 mg daily for depression and anxiety.  Pt to follow-up in 3 months or sooner if clinically indicated.  Patient advised to contact office with any questions, adverse effects, or acute worsening in signs and symptoms.   Yazmen was seen today for follow-up.  Diagnoses and all orders for this visit:  Recurrent major depressive disorder, in partial remission (HCC) -     ARIPiprazole (ABILIFY) 5 MG tablet; Take 1 tablet (5 mg total) by mouth  daily. -     DULoxetine (CYMBALTA) 60 MG capsule; Take 1 capsule (60 mg total) by mouth every morning. Take with a 30 mg capsule to equal total dose of 90 mg -     DULoxetine (CYMBALTA) 30 MG capsule; TAKE ONE CAPSULE BY MOUTH DAILY TAKE WITH 60 MG  Major depression, recurrent, chronic (HCC) -     Dextromethorphan-buPROPion ER (AUVELITY) 45-105 MG TBCR; Take 1 tablet by mouth 2 (two) times daily.  Attention deficit hyperactivity disorder (ADHD), predominantly inattentive type -     lisdexamfetamine (VYVANSE) 50 MG capsule; Take 1 capsule (50 mg total) by mouth daily. -     lisdexamfetamine (VYVANSE) 50 MG capsule; Take 1 capsule (50 mg total) by mouth daily. -     lisdexamfetamine (VYVANSE) 50 MG capsule; Take 1 capsule (50 mg total) by mouth daily.  Binge eating disorder -     lisdexamfetamine (VYVANSE) 50 MG capsule; Take 1 capsule (50 mg total)  by mouth daily. -     lisdexamfetamine (VYVANSE) 50 MG capsule; Take 1 capsule (50 mg total) by mouth daily. -     lisdexamfetamine (VYVANSE) 50 MG capsule; Take 1 capsule (50 mg total) by mouth daily.     Please see After Visit Summary for patient specific instructions.  Future Appointments  Date Time Provider Department Center  05/04/2023  8:30 AM Ronnald Nian, MD [facility]  08/23/2023  9:15 AM Joline Maxcy, MD [facility] None    No orders of the defined types were placed in this encounter.     -------------------------------

## 2022-09-10 ENCOUNTER — Telehealth: Payer: Self-pay | Admitting: Psychiatry

## 2022-09-10 NOTE — Telephone Encounter (Signed)
Pulled samples and notified patient.

## 2022-09-10 NOTE — Telephone Encounter (Signed)
Please see message on my chart that Hanley had sent to [place]. She wants to know if JC had responded yet. She is asking if we have any samples of Auvility? Last appt was 8/14. LM for her to schedule a follow-up appt.   [phone].

## 2022-09-10 NOTE — Telephone Encounter (Signed)
Please see MyChart message to you from 8/21. Is it okay to provide more samples?

## 2022-09-21 ENCOUNTER — Other Ambulatory Visit (INDEPENDENT_AMBULATORY_CARE_PROVIDER_SITE_OTHER): Payer: Medicare Other

## 2022-09-21 DIAGNOSIS — Z23 Encounter for immunization: Secondary | ICD-10-CM | POA: Diagnosis not present

## 2022-10-14 ENCOUNTER — Other Ambulatory Visit (INDEPENDENT_AMBULATORY_CARE_PROVIDER_SITE_OTHER): Payer: Medicare Other

## 2022-10-14 DIAGNOSIS — Z23 Encounter for immunization: Secondary | ICD-10-CM | POA: Diagnosis not present

## 2022-10-14 DIAGNOSIS — L648 Other androgenic alopecia: Secondary | ICD-10-CM | POA: Diagnosis not present

## 2022-11-03 DIAGNOSIS — H2510 Age-related nuclear cataract, unspecified eye: Secondary | ICD-10-CM | POA: Diagnosis not present

## 2022-11-04 DIAGNOSIS — Z1231 Encounter for screening mammogram for malignant neoplasm of breast: Secondary | ICD-10-CM | POA: Diagnosis not present

## 2022-11-04 LAB — HM MAMMOGRAPHY

## 2022-11-05 ENCOUNTER — Encounter: Payer: Self-pay | Admitting: Family Medicine

## 2022-11-22 ENCOUNTER — Encounter: Payer: Self-pay | Admitting: Family Medicine

## 2022-12-01 ENCOUNTER — Telehealth (INDEPENDENT_AMBULATORY_CARE_PROVIDER_SITE_OTHER): Payer: Medicare Other | Admitting: Psychiatry

## 2022-12-01 ENCOUNTER — Encounter: Payer: Self-pay | Admitting: Psychiatry

## 2022-12-01 DIAGNOSIS — F3341 Major depressive disorder, recurrent, in partial remission: Secondary | ICD-10-CM | POA: Diagnosis not present

## 2022-12-01 DIAGNOSIS — F50819 Binge eating disorder, unspecified: Secondary | ICD-10-CM

## 2022-12-01 DIAGNOSIS — F900 Attention-deficit hyperactivity disorder, predominantly inattentive type: Secondary | ICD-10-CM | POA: Diagnosis not present

## 2022-12-01 DIAGNOSIS — F339 Major depressive disorder, recurrent, unspecified: Secondary | ICD-10-CM | POA: Diagnosis not present

## 2022-12-01 MED ORDER — LISDEXAMFETAMINE DIMESYLATE 50 MG PO CAPS
50.0000 mg | ORAL_CAPSULE | Freq: Every day | ORAL | 0 refills | Status: DC
Start: 2023-01-26 — End: 2023-03-01

## 2022-12-01 MED ORDER — DULOXETINE HCL 30 MG PO CPEP: ORAL_CAPSULE | ORAL | 1 refills | Status: AC

## 2022-12-01 MED ORDER — ARIPIPRAZOLE 2 MG PO TABS: 2.0000 mg | ORAL_TABLET | Freq: Every day | ORAL | 1 refills | Status: AC

## 2022-12-01 MED ORDER — DULOXETINE HCL 60 MG PO CPEP: 60.0000 mg | ORAL_CAPSULE | Freq: Every morning | ORAL | 1 refills | Status: AC

## 2022-12-01 MED ORDER — AUVELITY 45-105 MG PO TBCR
1.0000 | EXTENDED_RELEASE_TABLET | Freq: Two times a day (BID) | ORAL | 4 refills | Status: DC
Start: 2022-12-01 — End: 2023-05-04
  Filled 2023-02-21 – 2023-02-23 (×3): qty 60, 30d supply, fill #0
  Filled 2023-03-24: qty 60, 30d supply, fill #1
  Filled 2023-04-16 (×2): qty 60, 30d supply, fill #2

## 2022-12-01 NOTE — Progress Notes (Signed)
Megan Nunez [id number] January 29, 1945 77 y.o.  Virtual Visit via Video Note  I connected with pt @ on 12/01/22 at  9:30 AM EST by a video enabled telemedicine application and verified that I am speaking with the correct person using two identifiers.   I discussed the limitations of evaluation and management by telemedicine and the availability of in person appointments. The patient expressed understanding and agreed to proceed.  I discussed the assessment and treatment plan with the patient. The patient was provided an opportunity to ask questions and all were answered. The patient agreed with the plan and demonstrated an understanding of the instructions.   The patient was advised to call back or seek an in-person evaluation if the symptoms worsen or if the condition fails to improve as anticipated.  I provided 35 minutes of non-face-to-face time during this encounter.  The patient was located at home.  The provider was located at home.   Corie Chiquito, PMHNP   Subjective:   Patient ID:  Megan Nunez is a 77 y.o. (DOB [birth year]) female.  Chief Complaint:  Chief Complaint  Patient presents with   Follow-up    Depression, Anxiety, ADHD, and insomnia    HPI Megan Nunez presents for follow-up of depression, ADHD, and insomnia. "I'm doing ok, which seems a little strange" with recent events. "I have the feeling that the Auvelity is keeping me cushioned" and notices she is not getting frustrated with others. She reports that she is "extra patient." She reports some possible affective dulling. She reports that all emotions seem to be reduced slightly. Denies sad mood. She reports that she has been sleeping more than usual. She is sleeping through the night and up to 9-10 hours a night. She reports that she has some anxiety about upcoming travel. She reports that her anxiety has been ok overall. She has some worry about their investments. She reports 30 lb weight loss since April and reports  that she has been snacking less and less interested in food. She reports that she continues to eat about twice daily. She reports that weight has been stabilized. She reports poor concentration- "when I remember to take the Vyvanse, it does help." She reports that she will take Vvyanse when trying to complete tasks that require more focus, such as paperwork. Denies SI.   Husband's nephew and family are coming to visit and they are looking forward to this. She is leaving for [facility] tomorrow for a family wedding. Recently went [place] a trip [place].  She reports that she and her husband have signed a contract for [facility] and considering getting on the wait list next year.  They are trying to downsize.   Vyvanse 50 mg last filled 10/25/22.   Past Psychiatric Medication Trials: Sertraline Prozac Cymbalta Wellbutrin XL Abilify Adderall Adderall XR Vyvanse  Review of Systems:  Review of Systems  Musculoskeletal:  Negative for gait problem.  Neurological:  Negative for tremors.  Psychiatric/Behavioral:         Please refer to HPI    Medications: I have reviewed the patient's current medications.  Current Outpatient Medications  Medication Sig Dispense Refill   Minoxidil 5 % FOAM Apply topically.     solifenacin (VESICARE) 5 MG tablet Take 1 tablet (5 mg total) by mouth daily. 90 tablet 3   ARIPiprazole (ABILIFY) 2 MG tablet Take 1 tablet (2 mg total) by mouth daily. 90 tablet 1   atorvastatin (LIPITOR) 20 MG  tablet Take 1 tablet (20 mg total) by mouth daily. 90 tablet 3   B Complex-C (B-COMPLEX WITH VITAMIN C) tablet Take 1 tablet by mouth daily.     BIOTIN PO Take 10,000 mcg by mouth daily.     Calcium Citrate-Vitamin D (CALCIUM CITRATE + D3) 200-6.25 MG-MCG TABS Take by mouth.     Cholecalciferol (VITAMIN D) 50 MCG (2000 UT) tablet Take 5,000 Units by mouth daily.     Dextromethorphan-buPROPion ER (AUVELITY) 45-105 MG TBCR Take 1 tablet by mouth 2 (two) times  daily. 60 tablet 4   DULoxetine (CYMBALTA) 30 MG capsule TAKE ONE CAPSULE BY MOUTH DAILY TAKE WITH 60 MG 90 capsule 1   DULoxetine (CYMBALTA) 60 MG capsule Take 1 capsule (60 mg total) by mouth every morning. Take with a 30 mg capsule to equal total dose of 90 mg 90 capsule 1   estradiol (ESTRACE VAGINAL) 0.1 MG/GM vaginal cream Place 1 Applicatorful vaginally 3 (three) times a week. 42.5 g 3   Ginkgo Biloba 40 MG TABS Take 120 mg by mouth daily.     GINSENG PO Take 400 mg by mouth daily.     lisdexamfetamine (VYVANSE) 50 MG capsule Take 1 capsule (50 mg total) by mouth daily. 30 capsule 0   lisdexamfetamine (VYVANSE) 50 MG capsule Take 1 capsule (50 mg total) by mouth daily. 30 capsule 0   [START ON 01/26/2023] lisdexamfetamine (VYVANSE) 50 MG capsule Take 1 capsule (50 mg total) by mouth daily. 30 capsule 0   MAGNESIUM PO Take by mouth.     Multiple Vitamins-Minerals (MULTIVITAMIN WITH MINERALS) tablet Take 1 tablet by mouth daily.     polyvinyl alcohol (LIQUIFILM TEARS) 1.4 % ophthalmic solution Place 1 drop into both eyes as needed for dry eyes.     Turmeric 500 MG CAPS Take 500 mg by mouth daily.     No current facility-administered medications for this visit.    Medication Side Effects: Other: Possible tremor  Allergies:  Allergies  Allergen Reactions   Nickel Itching and Rash    Past Medical History:  Diagnosis Date   Anxiety    Arthritis    Depression    Family history of adverse reaction to anesthesia 2012   Mother has cardiac  arrest after surgery   Hepatitis    high antibodies for hepatitis   Obesity     Family History  Problem Relation Age of Onset   Cancer Brother 42       pancreatic cancer   Alcohol abuse Brother    Depression Brother    Asthma Paternal Grandmother    Heart disease Mother        Mitral valve disease.   Dementia Mother    Lung disease Father        Idiopathic pulmonary fibrosis   Heart disease Maternal Grandmother        Died after  complications of angioplasty.   Depression Sister    Alcohol abuse Sister    Alcohol abuse Brother    Depression Brother    Alcohol abuse Brother    Depression Cousin    Depression Other     Social History   Socioeconomic History   Marital status: Married    Spouse name: Not on file   Number of children: Not on file   Years of education: Not on file   Highest education level: Not on file  Occupational History   Not on file  Tobacco Use   Smoking status:  Never   Smokeless tobacco: Never  Vaping Use   Vaping status: Never Used  Substance and Sexual Activity   Alcohol use: Yes    Alcohol/week: 1.0 standard drink of alcohol    Types: 1 Glasses of wine per week   Drug use: No   Sexual activity: Not Currently  Other Topics Concern   Not on file  Social History Narrative   She is married to another patient of [facility]. They have been married for 20  years.  No children. 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   He never smoked, and drinks maybe 1-3 glass of wine a day.   He has recently started exercising with her husband who has completed cardiac rehabilitation and is in the maintenance program. They usually walks about 2-3 days a week for 30-45 minutes.   Social Determinants of Health   Financial Resource Strain: Low Risk  (02/12/2021)   Overall Financial Resource Strain (CARDIA)    Difficulty of Paying Living Expenses: Not hard at all  Food Insecurity: No Food Insecurity (12/02/2021)   Hunger Vital Sign    Worried About Running Out of Food in the Last Year: Never true    Ran Out of Food in the Last Year: Never true  Transportation Needs: No Transportation Needs (12/02/2021)   PRAPARE - Administrator, Civil Service (Medical): No    Lack of Transportation (Non-Medical): No  Physical Activity: Not on file  Stress: Not on file  Social Connections: Not on file  Intimate Partner Violence: Not on file    Past Medical  History, Surgical history, Social history, and Family history were reviewed and updated as appropriate.   Please see review of systems for further details on the patient's review from today.   Objective:   Physical Exam:  Wt 132 lb (59.9 kg)   BMI 25.78 kg/m   Physical Exam Neurological:     Mental Status: She is alert and oriented to person, place, and time.     Cranial Nerves: No dysarthria.  Psychiatric:        Attention and Perception: Attention and perception normal.        Mood and Affect: Mood normal.        Speech: Speech normal.        Behavior: Behavior is cooperative.        Thought Content: Thought content normal. Thought content is not paranoid or delusional. Thought content does not include homicidal or suicidal ideation. Thought content does not include homicidal or suicidal plan.        Cognition and Memory: Cognition and memory normal.        Judgment: Judgment normal.     Comments: Insight intact     Lab Review:     Component Value Date/Time   NA 142 04/21/2022 0932   K 5.0 04/21/2022 0932   CL 103 04/21/2022 0932   CO2 21 04/21/2022 0932   GLUCOSE 103 (H) 04/21/2022 0932   GLUCOSE 131 (H) 04/01/2021 0325   BUN 12 04/21/2022 0932   CREATININE 0.75 04/21/2022 0932   CREATININE 0.63 12/08/2016 1030   CALCIUM 9.3 04/21/2022 0932   PROT 6.5 04/21/2022 0932   ALBUMIN 4.4 04/21/2022 0932   AST 15 04/21/2022 0932   ALT 12 04/21/2022 0932   ALKPHOS 83 04/21/2022 0932   BILITOT 0.4 04/21/2022 0932   GFRNONAA >60 04/01/2021 0325   GFRAA 88 04/09/2019 1239  Component Value Date/Time   WBC 7.6 04/21/2022 0932   WBC 15.8 (H) 04/01/2021 0325   RBC 4.79 04/21/2022 0932   RBC 4.19 04/01/2021 0325   HGB 14.5 04/21/2022 0932   HCT 43.7 04/21/2022 0932   PLT 216 04/21/2022 0932   MCV 91 04/21/2022 0932   MCH 30.3 04/21/2022 0932   MCH 30.1 04/01/2021 0325   MCHC 33.2 04/21/2022 0932   MCHC 33.3 04/01/2021 0325   RDW 12.6 04/21/2022 0932   LYMPHSABS  1.8 04/21/2022 0932   MONOABS 546 12/25/2015 1405   EOSABS 0.1 04/21/2022 0932   BASOSABS 0.1 04/21/2022 0932    No results found for: "POCLITH", "LITHIUM"   No results found for: "PHENYTOIN", "PHENOBARB", "VALPROATE", "CBMZ"   .res Assessment: Plan:    Discussed reducing dosage of Abilify to determine if this may be causing or exacerbating hand tremor. Discussed that depression has markedly improved with Auvelity, and that she may not experience worsening depressive symptoms with dose reduction in Abilify. Pt advised to contact office if she experiences recurrence of depression, and Abilify can be increased to 5 mg daily. Pt agrees to trial of dose decrease of Abilify to 2 mg daily.  Will continue Auvelity 45-105 mg one tablet twice daily for depression. Continue Vyvanse 50 mg daily for ADHD and binge eating disorder. Continue Cymbalta 90 mg daily for anxiety and depression.  Pt to follow-up in 3 months or sooner if clinically indicated.  Patient advised to contact office with any questions, adverse effects, or acute worsening in signs and symptoms.   Megan Nunez was seen today for follow-up.  Diagnoses and all orders for this visit:  Recurrent major depressive disorder, in partial remission (HCC) -     ARIPiprazole (ABILIFY) 2 MG tablet; Take 1 tablet (2 mg total) by mouth daily. -     DULoxetine (CYMBALTA) 60 MG capsule; Take 1 capsule (60 mg total) by mouth every morning. Take with a 30 mg capsule to equal total dose of 90 mg -     DULoxetine (CYMBALTA) 30 MG capsule; TAKE ONE CAPSULE BY MOUTH DAILY TAKE WITH 60 MG  Attention deficit hyperactivity disorder (ADHD), predominantly inattentive type -     lisdexamfetamine (VYVANSE) 50 MG capsule; Take 1 capsule (50 mg total) by mouth daily.  Binge eating disorder -     lisdexamfetamine (VYVANSE) 50 MG capsule; Take 1 capsule (50 mg total) by mouth daily.  Major depression, recurrent, chronic (HCC) -     Dextromethorphan-buPROPion ER  (AUVELITY) 45-105 MG TBCR; Take 1 tablet by mouth 2 (two) times daily.     Please see After Visit Summary for patient specific instructions.  Future Appointments  Date Time Provider Department Center  05/04/2023  8:30 AM Ronnald Nian, MD [facility]  08/23/2023  9:15 AM Joline Maxcy, MD [facility] None    No orders of the defined types were placed in this encounter.     -------------------------------

## 2023-01-13 ENCOUNTER — Other Ambulatory Visit: Payer: Self-pay | Admitting: Medical Genetics

## 2023-02-08 ENCOUNTER — Other Ambulatory Visit (HOSPITAL_COMMUNITY)
Admission: RE | Admit: 2023-02-08 | Discharge: 2023-02-08 | Disposition: A | Discharge status: Home / Self Care | Payer: Self-pay | Source: Ambulatory Visit | Attending: Oncology | Admitting: Oncology

## 2023-02-15 DIAGNOSIS — H01134 Eczematous dermatitis of left upper eyelid: Secondary | ICD-10-CM | POA: Diagnosis not present

## 2023-02-15 DIAGNOSIS — H01131 Eczematous dermatitis of right upper eyelid: Secondary | ICD-10-CM | POA: Diagnosis not present

## 2023-02-18 LAB — GENECONNECT MOLECULAR SCREEN: Genetic Analysis Overall Interpretation: NEGATIVE

## 2023-02-21 ENCOUNTER — Other Ambulatory Visit (HOSPITAL_COMMUNITY): Payer: Self-pay

## 2023-02-21 ENCOUNTER — Other Ambulatory Visit (HOSPITAL_BASED_OUTPATIENT_CLINIC_OR_DEPARTMENT_OTHER): Payer: Self-pay

## 2023-02-21 ENCOUNTER — Telehealth: Payer: Self-pay | Admitting: Family Medicine

## 2023-02-21 DIAGNOSIS — F50819 Binge eating disorder, unspecified: Secondary | ICD-10-CM

## 2023-02-21 DIAGNOSIS — F900 Attention-deficit hyperactivity disorder, predominantly inattentive type: Secondary | ICD-10-CM

## 2023-02-21 NOTE — Telephone Encounter (Signed)
Pt is aware that Dr.Lalonde is out of the office until next week. Pts current mental health provider is leaving the practice so she wants to know if you will refill her vyvanse until she can get back in with a mental health provider.

## 2023-02-23 ENCOUNTER — Other Ambulatory Visit (HOSPITAL_BASED_OUTPATIENT_CLINIC_OR_DEPARTMENT_OTHER): Payer: Self-pay

## 2023-02-23 ENCOUNTER — Other Ambulatory Visit (HOSPITAL_COMMUNITY): Payer: Self-pay

## 2023-03-01 MED ORDER — LISDEXAMFETAMINE DIMESYLATE 50 MG PO CAPS
50.0000 mg | ORAL_CAPSULE | Freq: Every day | ORAL | 0 refills | Status: DC
Start: 2023-03-01 — End: 2023-03-16

## 2023-03-10 ENCOUNTER — Other Ambulatory Visit (HOSPITAL_COMMUNITY): Payer: Self-pay

## 2023-03-14 ENCOUNTER — Other Ambulatory Visit (HOSPITAL_COMMUNITY): Payer: Self-pay

## 2023-03-14 ENCOUNTER — Telehealth: Payer: Self-pay

## 2023-03-14 NOTE — Telephone Encounter (Addendum)
 Pharmacy Patient Advocate Encounter   Received notification from Onbase that prior authorization for Vyvanse 50mg  is required/requested for a ''Medication Exception request''.   Insurance verification completed.   The patient is insured through [facility] .   Per test claim: PA required; PA submitted to above mentioned insurance via CoverMyMeds Key/confirmation #/EOC (Key: [id number])   Status is pending      Please note: no p/s is needed for generic

## 2023-03-15 ENCOUNTER — Encounter: Payer: Self-pay | Admitting: Internal Medicine

## 2023-03-15 NOTE — Telephone Encounter (Signed)
 I have received a Document for ''An Exception Request'' for the pts Vyvanse 50mg  Due to the medication no longer being on the pts Formulary.   Pharmacy Patient Advocate Encounter  Received notification from [facility] that Prior Authorization for [facility] has been DENIED.  Full denial letter will be uploaded to the media tab. See denial reason below.      PA #/Case ID/Reference #: (Key: [id number])

## 2023-03-16 ENCOUNTER — Telehealth (INDEPENDENT_AMBULATORY_CARE_PROVIDER_SITE_OTHER): Payer: Medicare Other | Admitting: Family Medicine

## 2023-03-16 ENCOUNTER — Encounter: Payer: Self-pay | Admitting: Family Medicine

## 2023-03-16 VITALS — Ht 60.5 in | Wt 127.0 lb

## 2023-03-16 DIAGNOSIS — N3946 Mixed incontinence: Secondary | ICD-10-CM

## 2023-03-16 DIAGNOSIS — F900 Attention-deficit hyperactivity disorder, predominantly inattentive type: Secondary | ICD-10-CM | POA: Diagnosis not present

## 2023-03-16 MED ORDER — AMPHETAMINE-DEXTROAMPHET ER 20 MG PO CP24
20.0000 mg | ORAL_CAPSULE | Freq: Every day | ORAL | 0 refills | Status: AC
Start: 2023-03-16 — End: ?

## 2023-03-16 MED ORDER — SOLIFENACIN SUCCINATE 5 MG PO TABS
5.0000 mg | ORAL_TABLET | Freq: Every day | ORAL | 3 refills | Status: DC
Start: 2023-03-16 — End: 2023-05-03

## 2023-03-16 NOTE — Progress Notes (Signed)
   Subjective:    Patient ID: Megan Nunez, female    DOB: [birth year], 78 y.o.   MRN: [id number]  HPI Documentation for virtual audio and video telecommunications through [place] encounter: The patient was located at home. 2 patient identifiers used.  The provider was located in the office. The patient did consent to this visit and is aware of possible charges through their insurance for this visit. The other persons participating in this telemedicine service were none. Time spent on call was 5 minutes and in review of previous records >20 minutes total for counseling and coordination of care. This virtual service is not related to other E/M service within previous 7 days.  She has an underlying history of ADHD and has been stable on Vyvanse for a long period of time however her insurance will not cover this.  She seems very stable on the Vyvanse helping with her focus.  She has had this her entire life.  She also would like a refill on her Vesicare to help with her incontinence.  Review of Systems     Objective:    Physical Exam Alert and in no distress otherwise not examined       Assessment & Plan:  Attention deficit hyperactivity disorder (ADHD), predominantly inattentive type  Mixed incontinence urge and stress I will switch her to Adderall XR 20 mg.  She will let me know if this dosing works or if we need to go to the higher dose which would roughly be equivalent to her Vyvanse which she was taking at 60 mg. Also Vesicare was called in for her.

## 2023-03-24 ENCOUNTER — Other Ambulatory Visit (HOSPITAL_BASED_OUTPATIENT_CLINIC_OR_DEPARTMENT_OTHER): Payer: Self-pay

## 2023-04-13 DIAGNOSIS — L218 Other seborrheic dermatitis: Secondary | ICD-10-CM | POA: Diagnosis not present

## 2023-04-13 DIAGNOSIS — H01131 Eczematous dermatitis of right upper eyelid: Secondary | ICD-10-CM | POA: Diagnosis not present

## 2023-04-13 DIAGNOSIS — H01134 Eczematous dermatitis of left upper eyelid: Secondary | ICD-10-CM | POA: Diagnosis not present

## 2023-04-13 DIAGNOSIS — L648 Other androgenic alopecia: Secondary | ICD-10-CM | POA: Diagnosis not present

## 2023-04-15 ENCOUNTER — Other Ambulatory Visit (HOSPITAL_BASED_OUTPATIENT_CLINIC_OR_DEPARTMENT_OTHER): Payer: Self-pay

## 2023-04-15 MED ORDER — LISDEXAMFETAMINE DIMESYLATE 50 MG PO CAPS: 50.0000 mg | ORAL_CAPSULE | Freq: Every day | ORAL | 0 refills | Status: DC

## 2023-04-15 MED ORDER — LISDEXAMFETAMINE DIMESYLATE 50 MG PO CAPS
50.0000 mg | ORAL_CAPSULE | Freq: Every morning | ORAL | 0 refills | Status: DC
Start: 2023-04-15 — End: 2023-11-16
  Filled 2023-04-15: qty 30, 30d supply, fill #0

## 2023-04-15 MED ORDER — LISDEXAMFETAMINE DIMESYLATE 50 MG PO CAPS: 50.0000 mg | ORAL_CAPSULE | Freq: Every morning | ORAL | 0 refills | Status: AC

## 2023-04-15 MED ORDER — AUVELITY 45-105 MG PO TBCR
1.0000 | EXTENDED_RELEASE_TABLET | Freq: Two times a day (BID) | ORAL | 1 refills | Status: DC
  Filled 2023-04-15 – 2023-06-06 (×2): qty 60, 30d supply, fill #0

## 2023-04-16 ENCOUNTER — Other Ambulatory Visit: Payer: Self-pay

## 2023-04-16 ENCOUNTER — Other Ambulatory Visit (HOSPITAL_BASED_OUTPATIENT_CLINIC_OR_DEPARTMENT_OTHER): Payer: Self-pay

## 2023-04-24 ENCOUNTER — Other Ambulatory Visit: Payer: Self-pay | Admitting: Family Medicine

## 2023-04-24 DIAGNOSIS — E785 Hyperlipidemia, unspecified: Secondary | ICD-10-CM

## 2023-05-04 ENCOUNTER — Ambulatory Visit (INDEPENDENT_AMBULATORY_CARE_PROVIDER_SITE_OTHER): Payer: Medicare Other | Admitting: Family Medicine

## 2023-05-04 ENCOUNTER — Encounter: Payer: Self-pay | Admitting: Family Medicine

## 2023-05-04 VITALS — BP 116/82 | HR 65 | Ht 59.0 in | Wt 127.0 lb

## 2023-05-04 DIAGNOSIS — M1990 Unspecified osteoarthritis, unspecified site: Secondary | ICD-10-CM | POA: Diagnosis not present

## 2023-05-04 DIAGNOSIS — N3946 Mixed incontinence: Secondary | ICD-10-CM

## 2023-05-04 DIAGNOSIS — E785 Hyperlipidemia, unspecified: Secondary | ICD-10-CM

## 2023-05-04 DIAGNOSIS — L659 Nonscarring hair loss, unspecified: Secondary | ICD-10-CM

## 2023-05-04 DIAGNOSIS — Z96652 Presence of left artificial knee joint: Secondary | ICD-10-CM | POA: Diagnosis not present

## 2023-05-04 DIAGNOSIS — R3915 Urgency of urination: Secondary | ICD-10-CM

## 2023-05-04 DIAGNOSIS — Z23 Encounter for immunization: Secondary | ICD-10-CM

## 2023-05-04 DIAGNOSIS — Z96611 Presence of right artificial shoulder joint: Secondary | ICD-10-CM

## 2023-05-04 DIAGNOSIS — M8580 Other specified disorders of bone density and structure, unspecified site: Secondary | ICD-10-CM

## 2023-05-04 DIAGNOSIS — E782 Mixed hyperlipidemia: Secondary | ICD-10-CM

## 2023-05-04 DIAGNOSIS — R634 Abnormal weight loss: Secondary | ICD-10-CM

## 2023-05-04 DIAGNOSIS — Z0000 Encounter for general adult medical examination without abnormal findings: Secondary | ICD-10-CM

## 2023-05-04 DIAGNOSIS — Z96651 Presence of right artificial knee joint: Secondary | ICD-10-CM | POA: Diagnosis not present

## 2023-05-04 DIAGNOSIS — F330 Major depressive disorder, recurrent, mild: Secondary | ICD-10-CM

## 2023-05-04 LAB — POCT URINALYSIS DIP (PROADVANTAGE DEVICE)
Bilirubin, UA: NEGATIVE
Blood, UA: NEGATIVE
Glucose, UA: NEGATIVE mg/dL
Ketones, POC UA: NEGATIVE mg/dL
Nitrite, UA: NEGATIVE
Protein Ur, POC: NEGATIVE mg/dL
Specific Gravity, Urine: 1.005
Urobilinogen, Ur: 0.2
pH, UA: 6 (ref 5.0–8.0)

## 2023-05-04 LAB — LIPID PANEL

## 2023-05-04 MED ORDER — SOLIFENACIN SUCCINATE 5 MG PO TABS: 5.0000 mg | ORAL_TABLET | Freq: Every day | ORAL | 3 refills | Status: DC

## 2023-05-04 MED ORDER — ATORVASTATIN CALCIUM 20 MG PO TABS: 20.0000 mg | ORAL_TABLET | Freq: Every day | ORAL | 3 refills | Status: AC

## 2023-05-04 NOTE — Progress Notes (Signed)
 Complete physical exam  Patient: Megan Nunez   DOB: [birth year]   78 y.o. Female  MRN: [id number]  Subjective:    Chief Complaint  Patient presents with   Annual Exam    Medicare well visit. CPE. Possible UTI.     Megan Nunez is a 78 y.o. female who presents today for a complete physical exam.  She reports consuming a general diet.  no  She generally feels well. She reports sleeping well.  She does complain of some urinary urgency.  She does continue on Vesicare for that.  She is concerned about UTI.  She is followed by her therapist and is doing well on her present medication regimen of Cymbalta, Vyvanse and Abilify.  She has lost weight since she has been on the Abilify.  This is being monitored by her therapist.  She continues on Lipitor and is having no difficulty with that.  She is also using minoxidil on her scalp which she states has helped hair growth.  She has had multiple joints replaced and is doing well with them.  She does have some minor arthritic symptoms that she uses Tylenol for.  Does have a history of osteopenia and will therefore need another DEXA.  Otherwise she has no particular concerns or complaints.  Most recent fall risk assessment:    05/04/2023    8:29 AM  Fall Risk   Falls in the past year? 1  Number falls in past yr: 1  Injury with Fall? 0  Risk for fall due to : History of fall(s)  Follow up Falls evaluation completed     Most recent depression screenings:    05/04/2023    8:29 AM 04/21/2022    8:32 AM  PHQ 2/9 Scores  PHQ - 2 Score 1 1  PHQ- 9 Score  5    Vision:Within last year and Dental: No current dental problems and Last dental visit: April 2025    Immunization History  Administered Date(s) Administered   Fluad Quad(high Dose 65+) 01/01/2020, 02/13/2021, 09/29/2021   Fluad Trivalent(High Dose 65+) 09/21/2022   Influenza Split 01/27/2011   Influenza, High Dose Seasonal PF 11/14/2013, 11/05/2014, 12/25/2015, 09/23/2016, 01/03/2018    Influenza,inj,Quad PF,6+ Mos 03/20/2013   Moderna Covid-19 Fall Seasonal Vaccine 92yrs & older 10/27/2021   PFIZER(Purple Top)SARS-COV-2 Vaccination 02/25/2019, 03/22/2019, 11/30/2019   PNEUMOCOCCAL CONJUGATE-20 05/04/2023   Pfizer Covid-19 Vaccine Bivalent Booster 38yrs & up 09/26/2020   Pfizer(Comirnaty)Fall Seasonal Vaccine 12 years and older 10/14/2022   Pneumococcal Conjugate-13 07/12/2013   Pneumococcal Polysaccharide-23 11/05/2014   Rsv, Bivalent, Protein Subunit Rsvpref,pf Megan Nunez) 10/27/2021   Tdap 08/27/2008, 02/12/2018   Zoster Recombinant(Shingrix) 02/12/2018, 08/04/2018   Zoster, Live 12/30/2008    Health Maintenance  Topic Date Due   COVID-19 Vaccine (7 - 2024-25 season) 04/13/2023   Medicare Annual Wellness (AWV)  04/21/2023   INFLUENZA VACCINE  08/19/2023   Fecal DNA (Cologuard)  04/29/2024   DTaP/Tdap/Td (3 - Td or Tdap) 02/13/2028   Pneumonia Vaccine 15+ Years old  Completed   DEXA SCAN  Completed   Hepatitis C Screening  Completed   Zoster Vaccines- Shingrix  Completed   HPV VACCINES  Aged Out   Meningococcal B Vaccine  Aged Out    Patient Care Team: Megan Nian, MD as PCP - General (Family Medicine) Verner Chol, Megan W. Megan Nunez (Inactive) as Pharmacist (Pharmacist)   Outpatient Medications Prior to Visit  Medication Sig   amphetamine-dextroamphetamine (ADDERALL XR) 20 MG 24 hr capsule Take  1 capsule (20 mg total) by mouth daily.   B Complex-C (B-COMPLEX WITH VITAMIN C) tablet Take 1 tablet by mouth daily.   BIOTIN PO Take 10,000 mcg by mouth daily.   Calcium Citrate-Vitamin D (CALCIUM CITRATE + D3) 200-6.25 MG-MCG TABS Take by mouth.   Cholecalciferol (VITAMIN D) 50 MCG (2000 UT) tablet Take 5,000 Units by mouth daily.   Dextromethorphan-buPROPion ER (AUVELITY) 45-105 MG TBCR Take 1 tablet by mouth 2 (two) times daily.   DULoxetine (CYMBALTA) 30 MG capsule TAKE ONE CAPSULE BY MOUTH DAILY TAKE WITH 60 MG   DULoxetine (CYMBALTA) 60 MG capsule Take 1 capsule  (60 mg total) by mouth every morning. Take with a 30 mg capsule to equal total dose of 90 mg   estradiol (ESTRACE VAGINAL) 0.1 MG/GM vaginal cream Place 1 Applicatorful vaginally 3 (three) times a week.   Ginkgo Biloba 40 MG TABS Take 120 mg by mouth daily.   GINSENG PO Take 400 mg by mouth daily.   [START ON 06/10/2023] lisdexamfetamine (VYVANSE) 50 MG capsule Take 1 capsule (50 mg total) by mouth every morning.   lisdexamfetamine (VYVANSE) 50 MG capsule Take 1 capsule (50 mg total) by mouth every morning.   MAGNESIUM PO Take by mouth.   Minoxidil 5 % FOAM Apply topically.   Multiple Vitamins-Minerals (MULTIVITAMIN WITH MINERALS) tablet Take 1 tablet by mouth daily.   polyvinyl alcohol (LIQUIFILM TEARS) 1.4 % ophthalmic solution Place 1 drop into both eyes as needed for dry eyes.   Turmeric 500 MG CAPS Take 500 mg by mouth daily.   ARIPiprazole (ABILIFY) 2 MG tablet Take 1 tablet (2 mg total) by mouth daily.   [START ON 05/13/2023] lisdexamfetamine (VYVANSE) 50 MG capsule Take 1 capsule (50 mg total) by mouth daily.   [DISCONTINUED] atorvastatin (LIPITOR) 20 MG tablet TAKE 1 TABLET BY MOUTH DAILY   [DISCONTINUED] Dextromethorphan-buPROPion ER (AUVELITY) 45-105 MG TBCR Take 1 tablet by mouth 2 (two) times daily.   [DISCONTINUED] solifenacin (VESICARE) 5 MG tablet Take 1 tablet (5 mg total) by mouth daily.   No facility-administered medications prior to visit.    Review of Systems  All other systems reviewed and are negative.   Family and social history as well as health maintenance and immunizations was reviewed.     Objective:       Physical Exam   Alert and in no distress. Tympanic membranes and canals are normal. Pharyngeal area is normal. Neck is supple without adenopathy or thyromegaly. Cardiac exam shows a regular sinus rhythm without murmurs or gallops. Lungs are clear to auscultation. Urinalysis is negative.     Assessment & Plan:     Routine general medical examination  at a health care facility  S/P total knee replacement, right  S/P TKR (total knee replacement), left  S/P shoulder replacement, right  Osteopenia, unspecified location - Plan: CBC with Differential/Platelet, Comprehensive metabolic panel with GFR, DG Bone Density, VITAMIN D 25 Hydroxy (Vit-D Deficiency, Fractures)  Mixed incontinence urge and stress - Plan: solifenacin (VESICARE) 5 MG tablet  Major depressive disorder, recurrent episode, mild (HCC)  Mixed hyperlipidemia  Arthritis  Alopecia  Hyperlipidemia, unspecified hyperlipidemia type - Plan: Lipid panel, atorvastatin (LIPITOR) 20 MG tablet  Need for vaccination against Streptococcus pneumoniae - Plan: Pneumococcal conjugate vaccine 20-valent (Prevnar 20)  Urinary urgency  Weight loss due to medication  I discussed the weight loss with her and we will keep in touch with her concerning this.  The therapist is also watching this.  She will continue on her present medication regimen. Return in about 1 year (around 05/03/2024).      Ron Cobbs, MD

## 2023-05-05 ENCOUNTER — Encounter: Payer: Self-pay | Admitting: Family Medicine

## 2023-05-05 LAB — CBC WITH DIFFERENTIAL/PLATELET
Basophils Absolute: 0.1 10*3/uL (ref 0.0–0.2)
Basos: 1 %
EOS (ABSOLUTE): 0.2 10*3/uL (ref 0.0–0.4)
Eos: 2 %
Hematocrit: 46 % (ref 34.0–46.6)
Hemoglobin: 15.4 g/dL (ref 11.1–15.9)
Immature Grans (Abs): 0 10*3/uL (ref 0.0–0.1)
Immature Granulocytes: 0 %
Lymphocytes Absolute: 1.9 10*3/uL (ref 0.7–3.1)
Lymphs: 24 %
MCH: 30.6 pg (ref 26.6–33.0)
MCHC: 33.5 g/dL (ref 31.5–35.7)
MCV: 92 fL (ref 79–97)
Monocytes Absolute: 0.6 10*3/uL (ref 0.1–0.9)
Monocytes: 8 %
Neutrophils Absolute: 5 10*3/uL (ref 1.4–7.0)
Neutrophils: 65 %
Platelets: 220 10*3/uL (ref 150–450)
RBC: 5.03 x10E6/uL (ref 3.77–5.28)
RDW: 12 % (ref 11.7–15.4)
WBC: 7.8 10*3/uL (ref 3.4–10.8)

## 2023-05-05 LAB — COMPREHENSIVE METABOLIC PANEL WITH GFR
ALT: 13 IU/L (ref 0–32)
AST: 19 IU/L (ref 0–40)
Albumin: 4.7 g/dL (ref 3.8–4.8)
Alkaline Phosphatase: 93 IU/L (ref 44–121)
BUN/Creatinine Ratio: 15 (ref 12–28)
BUN: 11 mg/dL (ref 8–27)
Bilirubin Total: 0.5 mg/dL (ref 0.0–1.2)
CO2: 24 mmol/L (ref 20–29)
Calcium: 9.5 mg/dL (ref 8.7–10.3)
Chloride: 100 mmol/L (ref 96–106)
Creatinine, Ser: 0.75 mg/dL (ref 0.57–1.00)
Globulin, Total: 2.2 g/dL (ref 1.5–4.5)
Glucose: 102 mg/dL — ABNORMAL HIGH (ref 70–99)
Potassium: 4.2 mmol/L (ref 3.5–5.2)
Sodium: 140 mmol/L (ref 134–144)
Total Protein: 6.9 g/dL (ref 6.0–8.5)
eGFR: 83 mL/min/{1.73_m2} (ref >59)

## 2023-05-05 LAB — LIPID PANEL
Cholesterol, Total: 128 mg/dL (ref 100–199)
HDL: 59 mg/dL (ref >39)
LDL CALC COMMENT:: 2.2 ratio (ref 0.0–4.4)
LDL Chol Calc (NIH): 56 mg/dL (ref 0–99)
Triglycerides: 60 mg/dL (ref 0–149)
VLDL Cholesterol Cal: 13 mg/dL (ref 5–40)

## 2023-05-05 LAB — VITAMIN D 25 HYDROXY (VIT D DEFICIENCY, FRACTURES): Vit D, 25-Hydroxy: 82.6 ng/mL (ref 30.0–100.0)

## 2023-06-06 ENCOUNTER — Other Ambulatory Visit (HOSPITAL_BASED_OUTPATIENT_CLINIC_OR_DEPARTMENT_OTHER): Payer: Self-pay

## 2023-07-05 ENCOUNTER — Other Ambulatory Visit (HOSPITAL_BASED_OUTPATIENT_CLINIC_OR_DEPARTMENT_OTHER): Payer: Self-pay

## 2023-07-05 MED ORDER — AUVELITY 45-105 MG PO TBCR
1.0000 | EXTENDED_RELEASE_TABLET | Freq: Two times a day (BID) | ORAL | 1 refills | Status: AC
  Filled 2023-07-05: qty 60, 30d supply, fill #0
  Filled 2023-09-23: qty 60, 30d supply, fill #1
  Filled 2023-11-02: qty 60, 30d supply, fill #2
  Filled 2023-12-13: qty 60, 30d supply, fill #3
  Filled 2024-01-28: qty 60, 30d supply, fill #4

## 2023-08-15 DIAGNOSIS — L821 Other seborrheic keratosis: Secondary | ICD-10-CM | POA: Diagnosis not present

## 2023-08-15 DIAGNOSIS — L814 Other melanin hyperpigmentation: Secondary | ICD-10-CM | POA: Diagnosis not present

## 2023-08-15 DIAGNOSIS — L570 Actinic keratosis: Secondary | ICD-10-CM | POA: Diagnosis not present

## 2023-08-15 DIAGNOSIS — H01139 Eczematous dermatitis of unspecified eye, unspecified eyelid: Secondary | ICD-10-CM | POA: Diagnosis not present

## 2023-08-15 DIAGNOSIS — L218 Other seborrheic dermatitis: Secondary | ICD-10-CM | POA: Diagnosis not present

## 2023-08-15 DIAGNOSIS — D225 Melanocytic nevi of trunk: Secondary | ICD-10-CM | POA: Diagnosis not present

## 2023-08-17 ENCOUNTER — Ambulatory Visit: Admitting: Urology

## 2023-08-17 ENCOUNTER — Encounter: Payer: Self-pay | Admitting: Urology

## 2023-08-17 VITALS — BP 146/80 | HR 84 | Ht 60.0 in | Wt 130.0 lb

## 2023-08-17 DIAGNOSIS — N3281 Overactive bladder: Secondary | ICD-10-CM

## 2023-08-17 DIAGNOSIS — N3941 Urge incontinence: Secondary | ICD-10-CM

## 2023-08-17 DIAGNOSIS — Z87440 Personal history of urinary (tract) infections: Secondary | ICD-10-CM | POA: Diagnosis not present

## 2023-08-17 LAB — MICROSCOPIC EXAMINATION: RBC, Urine: NONE SEEN /HPF (ref 0–2)

## 2023-08-17 LAB — URINALYSIS, ROUTINE W REFLEX MICROSCOPIC
Bilirubin, UA: NEGATIVE
Glucose, UA: NEGATIVE
Ketones, UA: NEGATIVE
Nitrite, UA: NEGATIVE
Protein,UA: NEGATIVE
Specific Gravity, UA: 1.015 (ref 1.005–1.030)
Urobilinogen, Ur: 0.2 mg/dL (ref 0.2–1.0)
pH, UA: 6 (ref 5.0–7.5)

## 2023-08-17 LAB — BLADDER SCAN AMB NON-IMAGING

## 2023-08-17 MED ORDER — ESTRADIOL 0.1 MG/GM VA CREA: 1.0000 | TOPICAL_CREAM | VAGINAL | 3 refills | Status: AC

## 2023-08-17 MED ORDER — SOLIFENACIN SUCCINATE 10 MG PO TABS: 10.0000 mg | ORAL_TABLET | Freq: Every day | ORAL | 3 refills | Status: DC

## 2023-08-17 NOTE — Progress Notes (Signed)
 Assessment: 1. OAB (overactive bladder)   2. Urge incontinence   3. History of UTI     Plan: I personally reviewed the patient's chart including provider notes, lab results. Her urinalysis is not suspicious for UTI. Recommend increasing the dose of solifenacin  to 10 mg daily.  Prescription sent. Return to office in 3 months.  Chief Complaint: Chief Complaint  Patient presents with   Over Active Bladder    HPI: Megan Nunez is a 78 y.o. female who presents for continued evaluation of OAB and urge incontinence. She was previously followed by Dr. Shona and was last seen in August 2024.  She has a history of OAB with urge incontinence.  She has been managed with vaginal estrogen cream and Vesicare  5 mg daily.  She was doing very well at the time of her visit in August 2024.  Urine culture from 8/24: >100 K E. coli.  Treated with cefdinir  x 5 days.  She returns today for follow-up.  She continues on solifenacin  5 mg daily. She has recently noted an increase in her frequency and urgency.  No dysuria.  No gross hematuria or flank pain.  She continues to use the Estrace  vaginal cream at times.  Portions of the above documentation were copied from a prior visit for review purposes only.  Allergies: Allergies  Allergen Reactions   Nickel Itching and Rash    PMH: Past Medical History:  Diagnosis Date   Anxiety    Arthritis    Depression    Family history of adverse reaction to anesthesia 2012   Mother has cardiac  arrest after surgery   Hepatitis    high antibodies for hepatitis   Obesity     PSH: Past Surgical History:  Procedure Laterality Date   LAPAROSCOPIC TUBAL LIGATION  1985   TOTAL KNEE ARTHROPLASTY Right 04/15/2020   Procedure: TOTAL KNEE ARTHROPLASTY;  Surgeon: Josefina Chew, MD;  Location: [facility];  Service: Orthopedics;  Laterality: Right;   TOTAL KNEE ARTHROPLASTY Left 08/05/2020   Procedure: TOTAL KNEE ARTHROPLASTY;  Surgeon: Josefina Chew, MD;  Location:  [facility];  Service: Orthopedics;  Laterality: Left;   TOTAL SHOULDER ARTHROPLASTY Right 03/31/2021   Procedure: TOTAL SHOULDER ARTHROPLASTY;  Surgeon: Josefina Chew, MD;  Location: [facility];  Service: Orthopedics;  Laterality: Right;   WISDOM TOOTH EXTRACTION     age 40    SH: Social History   Tobacco Use   Smoking status: Never   Smokeless tobacco: Never  Vaping Use   Vaping status: Never Used  Substance Use Topics   Alcohol use: Yes    Alcohol/week: 1.0 standard drink of alcohol    Types: 1 Glasses of wine per week   Drug use: No    ROS: Constitutional:  Negative for fever, chills, weight loss CV: Negative for chest pain, previous MI, hypertension Respiratory:  Negative for shortness of breath, wheezing, sleep apnea, frequent cough GI:  Negative for nausea, vomiting, bloody stool, GERD  PE: BP (!) 146/80   Pulse 84   Ht 5' (1.524 m)   Wt 130 lb (59 kg)   BMI 25.39 kg/m  GENERAL APPEARANCE:  Well appearing, well developed, well nourished, NAD HEENT:  Atraumatic, normocephalic, oropharynx clear NECK:  Supple without lymphadenopathy or thyromegaly ABDOMEN:  Soft, non-tender, no masses EXTREMITIES:  Moves all extremities well, without clubbing, cyanosis, or edema NEUROLOGIC:  Alert and oriented x 3, normal gait, CN II-XII grossly intact MENTAL STATUS:  appropriate BACK:  Non-tender to palpation,  No CVAT SKIN:  Warm, dry, and intact   Results: U/A: 6-10 WBCs, 0 RBCs, few bacteria.  PVR = 54 ml

## 2023-08-17 NOTE — Addendum Note (Signed)
 Addended by: OBADIAH ROSELEE RAMAN on: 08/17/2023 10:03 AM   Modules accepted: Orders

## 2023-08-23 ENCOUNTER — Ambulatory Visit: Payer: Medicare Other | Admitting: Urology

## 2023-09-30 DIAGNOSIS — M9908 Segmental and somatic dysfunction of rib cage: Secondary | ICD-10-CM | POA: Diagnosis not present

## 2023-09-30 DIAGNOSIS — M9902 Segmental and somatic dysfunction of thoracic region: Secondary | ICD-10-CM | POA: Diagnosis not present

## 2023-09-30 DIAGNOSIS — M9907 Segmental and somatic dysfunction of upper extremity: Secondary | ICD-10-CM | POA: Diagnosis not present

## 2023-09-30 DIAGNOSIS — M9901 Segmental and somatic dysfunction of cervical region: Secondary | ICD-10-CM | POA: Diagnosis not present

## 2023-09-30 DIAGNOSIS — M549 Dorsalgia, unspecified: Secondary | ICD-10-CM | POA: Diagnosis not present

## 2023-09-30 DIAGNOSIS — G8929 Other chronic pain: Secondary | ICD-10-CM | POA: Diagnosis not present

## 2023-09-30 DIAGNOSIS — M25512 Pain in left shoulder: Secondary | ICD-10-CM | POA: Diagnosis not present

## 2023-10-11 ENCOUNTER — Other Ambulatory Visit (INDEPENDENT_AMBULATORY_CARE_PROVIDER_SITE_OTHER)

## 2023-10-11 DIAGNOSIS — Z23 Encounter for immunization: Secondary | ICD-10-CM

## 2023-11-09 DIAGNOSIS — Z1231 Encounter for screening mammogram for malignant neoplasm of breast: Secondary | ICD-10-CM | POA: Diagnosis not present

## 2023-11-09 LAB — HM MAMMOGRAPHY

## 2023-11-16 ENCOUNTER — Encounter: Payer: Self-pay | Admitting: Urology

## 2023-11-16 ENCOUNTER — Ambulatory Visit: Admitting: Urology

## 2023-11-16 VITALS — BP 137/80 | HR 76 | Ht 60.0 in | Wt 124.0 lb

## 2023-11-16 DIAGNOSIS — N3281 Overactive bladder: Secondary | ICD-10-CM | POA: Diagnosis not present

## 2023-11-16 DIAGNOSIS — N3941 Urge incontinence: Secondary | ICD-10-CM

## 2023-11-16 DIAGNOSIS — R8290 Unspecified abnormal findings in urine: Secondary | ICD-10-CM

## 2023-11-16 DIAGNOSIS — Z87440 Personal history of urinary (tract) infections: Secondary | ICD-10-CM

## 2023-11-16 LAB — URINALYSIS, ROUTINE W REFLEX MICROSCOPIC
Bilirubin, UA: NEGATIVE
Glucose, UA: NEGATIVE
Ketones, UA: NEGATIVE
Nitrite, UA: NEGATIVE
Protein,UA: NEGATIVE
Specific Gravity, UA: 1.02 (ref 1.005–1.030)
Urobilinogen, Ur: 0.2 mg/dL (ref 0.2–1.0)
pH, UA: 5.5 (ref 5.0–7.5)

## 2023-11-16 LAB — MICROSCOPIC EXAMINATION: WBC, UA: >30 /HPF — AB (ref 0–5)

## 2023-11-16 MED ORDER — TROSPIUM CHLORIDE 20 MG PO TABS
20.0000 mg | ORAL_TABLET | Freq: Two times a day (BID) | ORAL | 11 refills | Status: AC
Start: 2023-11-16 — End: ?

## 2023-11-16 NOTE — Progress Notes (Signed)
 Assessment: 1. OAB (overactive bladder)   2. Urge incontinence   3. History of UTI     Plan: Urine culture sent today. Will hold on any antibiotic therapy at this time as she is not currently symptomatic. Trial of trospium 20 mg twice daily in place of solifenacin .  Prescription sent. I advised her to contact me with results of medication in approximately 1 month. Return to office in 3 months.  Chief Complaint: Chief Complaint  Patient presents with   Over Active Bladder    HPI: Megan Nunez is a 78 y.o. female who presents for continued evaluation of OAB and urge incontinence. She was previously followed by Dr. Shona and was last seen in August 2024.  She has a history of OAB with urge incontinence.  She has been managed with vaginal estrogen cream and Vesicare  5 mg daily.  She was doing very well at the time of her visit in August 2024.  Urine culture from 8/24: >100 K E. coli.  Treated with cefdinir  x 5 days.  At her visit in July 2025, she continued on solifenacin  5 mg daily. She noted an increase in her frequency and urgency.  No dysuria.  No gross hematuria or flank pain.  She continued to use the Estrace  vaginal cream at times. Her dose of solifenacin  was increased to 10 mg daily.  She returns today for follow-up.  She continues on solifenacin  10 mg daily.  She continues to have frequency and urgency.  She also reports a dry mouth with the increased dose of solifenacin .  She feels only a slight improvement with the dose increase.  No dysuria or gross hematuria.  Portions of the above documentation were copied from a prior visit for review purposes only.  Allergies: Allergies  Allergen Reactions   Nickel Itching and Rash    PMH: Past Medical History:  Diagnosis Date   Anxiety    Arthritis    Depression    Family history of adverse reaction to anesthesia 2012   Mother has cardiac  arrest after surgery   Hepatitis    high antibodies for hepatitis   Obesity      PSH: Past Surgical History:  Procedure Laterality Date   LAPAROSCOPIC TUBAL LIGATION  1985   TOTAL KNEE ARTHROPLASTY Right 04/15/2020   Procedure: TOTAL KNEE ARTHROPLASTY;  Surgeon: Josefina Chew, MD;  Location: [facility];  Service: Orthopedics;  Laterality: Right;   TOTAL KNEE ARTHROPLASTY Left 08/05/2020   Procedure: TOTAL KNEE ARTHROPLASTY;  Surgeon: Josefina Chew, MD;  Location: [facility];  Service: Orthopedics;  Laterality: Left;   TOTAL SHOULDER ARTHROPLASTY Right 03/31/2021   Procedure: TOTAL SHOULDER ARTHROPLASTY;  Surgeon: Josefina Chew, MD;  Location: [facility];  Service: Orthopedics;  Laterality: Right;   WISDOM TOOTH EXTRACTION     age 26    SH: Social History   Tobacco Use   Smoking status: Never   Smokeless tobacco: Never  Vaping Use   Vaping status: Never Used  Substance Use Topics   Alcohol use: Yes    Alcohol/week: 1.0 standard drink of alcohol    Types: 1 Glasses of wine per week   Drug use: No    ROS: Constitutional:  Negative for fever, chills, weight loss CV: Negative for chest pain, previous MI, hypertension Respiratory:  Negative for shortness of breath, wheezing, sleep apnea, frequent cough GI:  Negative for nausea, vomiting, bloody stool, GERD  PE: BP 137/80   Pulse 76   Ht 5' (1.524 m)  Wt 124 lb (56.2 kg)   BMI 24.22 kg/m  GENERAL APPEARANCE:  Well appearing, well developed, well nourished, NAD HEENT:  Atraumatic, normocephalic, oropharynx clear NECK:  Supple without lymphadenopathy or thyromegaly ABDOMEN:  Soft, non-tender, no masses EXTREMITIES:  Moves all extremities well, without clubbing, cyanosis, or edema NEUROLOGIC:  Alert and oriented x 3, normal gait, CN II-XII grossly intact MENTAL STATUS:  appropriate BACK:  Non-tender to palpation, No CVAT SKIN:  Warm, dry, and intact   Results: U/A: >30 WBCs, 3-10 RBCs, many bacteria, nitrite negative

## 2023-11-19 ENCOUNTER — Encounter: Payer: Self-pay | Admitting: Urology

## 2023-11-19 LAB — URINE CULTURE

## 2023-11-21 ENCOUNTER — Ambulatory Visit: Payer: Self-pay | Admitting: Urology

## 2023-11-21 DIAGNOSIS — R8290 Unspecified abnormal findings in urine: Secondary | ICD-10-CM

## 2023-11-21 MED ORDER — CEFDINIR 300 MG PO CAPS: 300.0000 mg | ORAL_CAPSULE | Freq: Two times a day (BID) | ORAL | 0 refills | Status: AC

## 2023-12-22 ENCOUNTER — Ambulatory Visit: Payer: Self-pay | Admitting: Family Medicine

## 2023-12-22 ENCOUNTER — Ambulatory Visit (HOSPITAL_BASED_OUTPATIENT_CLINIC_OR_DEPARTMENT_OTHER)
Admission: RE | Admit: 2023-12-22 | Discharge: 2023-12-22 | Disposition: A | Source: Ambulatory Visit | Attending: Family Medicine | Admitting: Family Medicine

## 2023-12-22 DIAGNOSIS — M8580 Other specified disorders of bone density and structure, unspecified site: Secondary | ICD-10-CM | POA: Insufficient documentation

## 2024-01-03 ENCOUNTER — Other Ambulatory Visit

## 2024-01-03 DIAGNOSIS — Z23 Encounter for immunization: Secondary | ICD-10-CM

## 2024-01-03 NOTE — Progress Notes (Signed)
 Patient is in office today for a nurse visit for Immunization. Patient Injection was given in the  Right deltoid. Patient tolerated injection well.

## 2024-01-30 ENCOUNTER — Other Ambulatory Visit

## 2024-02-01 ENCOUNTER — Other Ambulatory Visit (HOSPITAL_BASED_OUTPATIENT_CLINIC_OR_DEPARTMENT_OTHER): Payer: Self-pay

## 2024-02-06 ENCOUNTER — Encounter: Payer: Self-pay | Admitting: Urology

## 2024-02-16 ENCOUNTER — Ambulatory Visit: Admitting: Urology

## 2024-05-07 ENCOUNTER — Ambulatory Visit: Payer: Self-pay | Admitting: Family Medicine
